# Patient Record
Sex: Male | Born: 1950 | Race: White | Marital: Married | Smoking: Current some day smoker
Health system: Southern US, Community
[De-identification: ages and names within clinical notes are randomized; demographics above are authoritative.]

## PROBLEM LIST (undated history)

## (undated) DIAGNOSIS — I739 Peripheral vascular disease, unspecified: Secondary | ICD-10-CM

## (undated) DIAGNOSIS — N059 Unspecified nephritic syndrome with unspecified morphologic changes: Secondary | ICD-10-CM

## (undated) DIAGNOSIS — F1020 Alcohol dependence, uncomplicated: Secondary | ICD-10-CM

## (undated) DIAGNOSIS — Z9889 Other specified postprocedural states: Secondary | ICD-10-CM

## (undated) DIAGNOSIS — T8859XA Other complications of anesthesia, initial encounter: Secondary | ICD-10-CM

## (undated) DIAGNOSIS — Z8489 Family history of other specified conditions: Secondary | ICD-10-CM

## (undated) DIAGNOSIS — R112 Nausea with vomiting, unspecified: Secondary | ICD-10-CM

## (undated) DIAGNOSIS — M1990 Unspecified osteoarthritis, unspecified site: Secondary | ICD-10-CM

## (undated) DIAGNOSIS — E785 Hyperlipidemia, unspecified: Secondary | ICD-10-CM

## (undated) DIAGNOSIS — F1910 Other psychoactive substance abuse, uncomplicated: Secondary | ICD-10-CM

## (undated) DIAGNOSIS — K635 Polyp of colon: Secondary | ICD-10-CM

## (undated) DIAGNOSIS — H269 Unspecified cataract: Secondary | ICD-10-CM

## (undated) HISTORY — PX: TONSILLECTOMY: SUR1361

## (undated) HISTORY — DX: Alcohol dependence, uncomplicated: F10.20

## (undated) HISTORY — PX: COLONOSCOPY: SHX174

## (undated) HISTORY — PX: EYE SURGERY: SHX253

## (undated) HISTORY — DX: Unspecified cataract: H26.9

## (undated) HISTORY — DX: Hyperlipidemia, unspecified: E78.5

## (undated) HISTORY — DX: Other psychoactive substance abuse, uncomplicated: F19.10

## (undated) HISTORY — DX: Polyp of colon: K63.5

---

## 1978-02-19 HISTORY — PX: KNEE ARTHROSCOPY: SUR90

## 2011-02-20 HISTORY — PX: CATARACT EXTRACTION W/ INTRAOCULAR LENS  IMPLANT, BILATERAL: SHX1307

## 2011-02-20 HISTORY — PX: EYE SURGERY: SHX253

## 2018-12-18 ENCOUNTER — Other Ambulatory Visit: Payer: Self-pay | Admitting: Neurosurgery

## 2018-12-18 DIAGNOSIS — M48062 Spinal stenosis, lumbar region with neurogenic claudication: Secondary | ICD-10-CM

## 2018-12-23 ENCOUNTER — Other Ambulatory Visit: Payer: Self-pay

## 2018-12-23 ENCOUNTER — Ambulatory Visit
Admission: RE | Admit: 2018-12-23 | Discharge: 2018-12-23 | Disposition: A | Discharge status: Home / Self Care | Payer: Medicare Other | Source: Ambulatory Visit | Attending: Neurosurgery | Admitting: Neurosurgery

## 2018-12-23 DIAGNOSIS — M48062 Spinal stenosis, lumbar region with neurogenic claudication: Secondary | ICD-10-CM

## 2018-12-23 MED ORDER — METHYLPREDNISOLONE ACETATE 40 MG/ML INJ SUSP (RADIOLOG
120.0000 mg | Freq: Once | INTRAMUSCULAR | Status: AC
  Administered 2018-12-23: 120 mg via EPIDURAL

## 2018-12-23 MED ORDER — IOPAMIDOL (ISOVUE-M 200) INJECTION 41%
1.0000 mL | Freq: Once | INTRAMUSCULAR | Status: AC
  Administered 2018-12-23: 11:00:00 1 mL via EPIDURAL

## 2018-12-23 NOTE — Discharge Instructions (Signed)

## 2019-01-05 ENCOUNTER — Other Ambulatory Visit: Payer: Self-pay | Admitting: Neurosurgery

## 2019-01-05 DIAGNOSIS — M48062 Spinal stenosis, lumbar region with neurogenic claudication: Secondary | ICD-10-CM

## 2019-01-07 ENCOUNTER — Ambulatory Visit
Admission: RE | Admit: 2019-01-07 | Discharge: 2019-01-07 | Disposition: A | Discharge status: Home / Self Care | Payer: Medicare Other | Source: Ambulatory Visit | Attending: Neurosurgery | Admitting: Neurosurgery

## 2019-01-07 ENCOUNTER — Other Ambulatory Visit: Payer: Self-pay

## 2019-01-07 DIAGNOSIS — M48062 Spinal stenosis, lumbar region with neurogenic claudication: Secondary | ICD-10-CM

## 2019-01-07 MED ORDER — METHYLPREDNISOLONE ACETATE 40 MG/ML INJ SUSP (RADIOLOG
120.0000 mg | Freq: Once | INTRAMUSCULAR | Status: AC
  Administered 2019-01-07: 120 mg via EPIDURAL

## 2019-01-07 MED ORDER — IOPAMIDOL (ISOVUE-M 200) INJECTION 41%
1.0000 mL | Freq: Once | INTRAMUSCULAR | Status: AC
  Administered 2019-01-07: 1 mL via EPIDURAL

## 2019-01-07 NOTE — Discharge Instructions (Signed)

## 2019-01-20 ENCOUNTER — Other Ambulatory Visit: Payer: Self-pay | Admitting: Neurosurgery

## 2019-01-21 ENCOUNTER — Other Ambulatory Visit: Payer: Self-pay | Admitting: Neurosurgery

## 2019-02-02 NOTE — Pre-Procedure Instructions (Signed)
Gerald Carpenter  02/02/2019      [address] [place] [place] [address] Phone: [phone] Fax: [phone]    Your procedure is scheduled on February 05, 2019.  Report to [facility] En[facility] "A" at 1040 AM.  Call this number if you have problems the morning of surgery:  [phone]  Call [phone] if you have any questions prior to your surgery date Monday-Friday 8am-4pm    Remember:  Do not eat or drink after midnight.  Take these medicines the morning of surgery with A SIP OF WATER -NONE   Beginning now, STOP taking any Aspirin (unless otherwise instructed by your surgeon), Aleve, Naproxen, Ibuprofen, Motrin, Advil, Goody's, BC's, all herbal medications, fish oil, and all vitamins  DAY OF SURGERY:  Do not wear jewelry  Do not wear lotions, powders, or colognes, or deodorant.  Men may shave face and neck.  Do not bring valuables to the hospital.  [facility] is not responsible for any belongings or valuables.  IF you are a smoker, DO NOT Smoke 24 hours prior to surgery   IF you wear a CPAP at night please bring your mask, tubing, and machine the morning of surgery    Remember that you must have someone to transport you home after your surgery, and remain with you for 24 hours if you are discharged the same day.  Contacts, dentures or bridgework may not be worn into surgery.  Leave your suitcase in the car.  After surgery it may be brought to your room.  For patients admitted to the hospital, discharge time will be determined by your treatment team.  Patients discharged the day of surgery will not be allowed to drive home.    [facility]- Preparing For Surgery  Before surgery, you can play an important role. Because skin is not sterile, your skin needs to be as free of germs as possible. You can reduce the number of germs on your skin by washing with CHG (chlorahexidine gluconate) Soap before  surgery.  CHG is an antiseptic cleaner which kills germs and bonds with the skin to continue killing germs even after washing.    Oral Hygiene is also important to reduce your risk of infection.  Remember - BRUSH YOUR TEETH THE MORNING OF SURGERY WITH YOUR REGULAR TOOTHPASTE  Please do not use if you have an allergy to CHG or antibacterial soaps. If your skin becomes reddened/irritated stop using the CHG.  Do not shave (including legs and underarms) for at least 48 hours prior to first CHG shower. It is OK to shave your face.  Please follow these instructions carefully.   1. Shower the NIGHT BEFORE SURGERY and the MORNING OF SURGERY with CHG.   2. If you chose to wash your hair, wash your hair first as usual with your normal shampoo.  3. After you shampoo, rinse your hair and body thoroughly to remove the shampoo.  4. Use CHG as you would any other liquid soap. You can apply CHG directly to the skin and wash gently with a scrungie or a clean washcloth.   5. Apply the CHG Soap to your body ONLY FROM THE NECK DOWN.  Do not use on open wounds or open sores. Avoid contact with your eyes, ears, mouth and genitals (private parts). Wash Face and genitals (private parts)  with your normal soap.  6. Wash thoroughly, paying special attention to the area where your  surgery will be performed.  7. Thoroughly rinse your body with warm water from the neck down.  8. DO NOT shower/wash with your normal soap after using and rinsing off the CHG Soap.  9. Pat yourself dry with a CLEAN TOWEL.  10. Wear CLEAN PAJAMAS to bed the night before surgery, wear comfortable clothes the morning of surgery  11. Place CLEAN SHEETS on your bed the night of your first shower and DO NOT SLEEP WITH PETS.  Day of Surgery: Shower with CHG soap as above Do not apply any deodorants/lotions.  Please wear clean clothes to the hospital/surgery center.   Remember to brush your teeth WITH YOUR REGULAR TOOTHPASTE.  Please  read over the following fact sheets that you were given.

## 2019-02-03 ENCOUNTER — Encounter (HOSPITAL_COMMUNITY): Payer: Self-pay

## 2019-02-03 ENCOUNTER — Other Ambulatory Visit: Payer: Self-pay

## 2019-02-03 ENCOUNTER — Encounter (HOSPITAL_COMMUNITY)
Admission: RE | Admit: 2019-02-03 | Discharge: 2019-02-03 | Disposition: A | Discharge status: Home / Self Care | Payer: Medicare Other | Source: Ambulatory Visit | Attending: Neurosurgery | Admitting: Neurosurgery

## 2019-02-03 ENCOUNTER — Other Ambulatory Visit (HOSPITAL_COMMUNITY)
Admission: RE | Admit: 2019-02-03 | Discharge: 2019-02-03 | Disposition: A | Discharge status: Home / Self Care | Payer: Medicare Other | Source: Ambulatory Visit | Attending: Neurosurgery | Admitting: Neurosurgery

## 2019-02-03 DIAGNOSIS — Z20828 Contact with and (suspected) exposure to other viral communicable diseases: Secondary | ICD-10-CM | POA: Insufficient documentation

## 2019-02-03 DIAGNOSIS — J449 Chronic obstructive pulmonary disease, unspecified: Secondary | ICD-10-CM | POA: Diagnosis not present

## 2019-02-03 DIAGNOSIS — Z01812 Encounter for preprocedural laboratory examination: Secondary | ICD-10-CM | POA: Insufficient documentation

## 2019-02-03 DIAGNOSIS — M5116 Intervertebral disc disorders with radiculopathy, lumbar region: Secondary | ICD-10-CM | POA: Diagnosis not present

## 2019-02-03 DIAGNOSIS — Z5309 Procedure and treatment not carried out because of other contraindication: Secondary | ICD-10-CM | POA: Diagnosis not present

## 2019-02-03 DIAGNOSIS — M48061 Spinal stenosis, lumbar region without neurogenic claudication: Secondary | ICD-10-CM | POA: Diagnosis present

## 2019-02-03 DIAGNOSIS — F17210 Nicotine dependence, cigarettes, uncomplicated: Secondary | ICD-10-CM | POA: Diagnosis not present

## 2019-02-03 LAB — SURGICAL PCR SCREEN
MRSA, PCR: NEGATIVE
Staphylococcus aureus: NEGATIVE

## 2019-02-03 LAB — ABO/RH: ABO/RH(D): A POS

## 2019-02-03 LAB — HEMOGLOBIN: Hemoglobin: 17 g/dL (ref 13.0–17.0)

## 2019-02-03 LAB — SARS CORONAVIRUS 2 (TAT 6-24 HRS): SARS Coronavirus 2: NEGATIVE

## 2019-02-03 NOTE — Progress Notes (Signed)
PCP - Denies; just moved here from [place] - Denies  Chest x-ray - N/A EKG - N/A Stress Test - denies  ECHO - denies Cardiac Cath - denies  Blood Thinner Instructions: N/A Aspirin Instructions: N/A  COVID TEST- scheduled after PAT today, 02/03/19  Coronavirus Screening  Have you experienced the following symptoms:  Cough yes/no: No Fever (>100.45F)  yes/no: No Runny nose yes/no: No Sore throat yes/no: No Difficulty breathing/shortness of breath  yes/no: No  Have you or a family member traveled in the last 14 days and where? yes/no: No  If the patient indicates "YES" to the above questions, their PAT will be rescheduled to limit the exposure to others and, the surgeon will be notified. THE PATIENT WILL NEED TO BE ASYMPTOMATIC FOR 14 DAYS.   If the patient is not experiencing any of these symptoms, the PAT nurse will instruct them to NOT bring anyone with them to their appointment since they may have these symptoms or traveled as well.   Please remind your patients and families that hospital visitation restrictions are in effect and the importance of the restrictions.   Anesthesia review: No  Patient denies shortness of breath, fever, cough and chest pain at PAT appointment  All instructions explained to the patient, with a verbal understanding of the material. Patient agrees to go over the instructions while at home for a better understanding. Patient also instructed to self quarantine after being tested for COVID-19. The opportunity to ask questions was provided.

## 2019-02-05 ENCOUNTER — Other Ambulatory Visit: Payer: Self-pay | Admitting: Neurosurgery

## 2019-02-05 ENCOUNTER — Encounter (HOSPITAL_COMMUNITY)
Admission: RE | Disposition: A | Discharge status: Home / Self Care | Payer: Self-pay | Source: Home / Self Care | Attending: Neurosurgery

## 2019-02-05 ENCOUNTER — Inpatient Hospital Stay (HOSPITAL_COMMUNITY): Payer: Medicare Other | Admitting: Certified Registered"

## 2019-02-05 ENCOUNTER — Inpatient Hospital Stay (HOSPITAL_COMMUNITY)
Admission: RE | Admit: 2019-02-05 | Discharge: 2019-02-06 | DRG: 552 | Disposition: A | Discharge status: Home / Self Care | Payer: Medicare Other | Attending: Neurosurgery | Admitting: Neurosurgery

## 2019-02-05 ENCOUNTER — Encounter (HOSPITAL_COMMUNITY): Payer: Self-pay | Admitting: Neurosurgery

## 2019-02-05 ENCOUNTER — Other Ambulatory Visit: Payer: Self-pay

## 2019-02-05 ENCOUNTER — Inpatient Hospital Stay (HOSPITAL_COMMUNITY): Payer: Medicare Other

## 2019-02-05 DIAGNOSIS — Z5309 Procedure and treatment not carried out because of other contraindication: Secondary | ICD-10-CM | POA: Diagnosis not present

## 2019-02-05 DIAGNOSIS — J449 Chronic obstructive pulmonary disease, unspecified: Secondary | ICD-10-CM | POA: Diagnosis not present

## 2019-02-05 DIAGNOSIS — M5416 Radiculopathy, lumbar region: Secondary | ICD-10-CM | POA: Diagnosis present

## 2019-02-05 DIAGNOSIS — M48061 Spinal stenosis, lumbar region without neurogenic claudication: Secondary | ICD-10-CM | POA: Diagnosis present

## 2019-02-05 DIAGNOSIS — Z419 Encounter for procedure for purposes other than remedying health state, unspecified: Secondary | ICD-10-CM

## 2019-02-05 DIAGNOSIS — M5116 Intervertebral disc disorders with radiculopathy, lumbar region: Secondary | ICD-10-CM | POA: Diagnosis not present

## 2019-02-05 DIAGNOSIS — Z20828 Contact with and (suspected) exposure to other viral communicable diseases: Secondary | ICD-10-CM | POA: Diagnosis present

## 2019-02-05 DIAGNOSIS — F17210 Nicotine dependence, cigarettes, uncomplicated: Secondary | ICD-10-CM | POA: Diagnosis present

## 2019-02-05 HISTORY — PX: ANTERIOR LAT LUMBAR FUSION: SHX1168

## 2019-02-05 LAB — CBC
HCT: 47.8 % (ref 39.0–52.0)
Hemoglobin: 15.9 g/dL (ref 13.0–17.0)
MCH: 31.2 pg (ref 26.0–34.0)
MCHC: 33.3 g/dL (ref 30.0–36.0)
MCV: 93.9 fL (ref 80.0–100.0)
Platelets: 213 10*3/uL (ref 150–400)
RBC: 5.09 MIL/uL (ref 4.22–5.81)
RDW: 14.2 % (ref 11.5–15.5)
WBC: 13.8 10*3/uL — ABNORMAL HIGH (ref 4.0–10.5)
nRBC: 0 % (ref 0.0–0.2)

## 2019-02-05 SURGERY — ANTERIOR LATERAL LUMBAR FUSION 1 LEVEL
Anesthesia: General | Site: Spine Lumbar | Laterality: Right

## 2019-02-05 MED ORDER — PROPOFOL 500 MG/50ML IV EMUL
INTRAVENOUS | Status: DC | PRN
  Administered 2019-02-05: 50 ug/kg/min via INTRAVENOUS

## 2019-02-05 MED ORDER — HYDROMORPHONE HCL 1 MG/ML IJ SOLN: 0.5000 mg | INTRAMUSCULAR | Status: DC | PRN

## 2019-02-05 MED ORDER — FENTANYL CITRATE (PF) 100 MCG/2ML IJ SOLN
INTRAMUSCULAR | Status: AC
  Filled 2019-02-05: qty 2

## 2019-02-05 MED ORDER — SODIUM CHLORIDE 0.9 % IV SOLN: INTRAVENOUS | Status: DC

## 2019-02-05 MED ORDER — BUPIVACAINE HCL (PF) 0.5 % IJ SOLN
INTRAMUSCULAR | Status: DC | PRN
  Administered 2019-02-05: 5 mL

## 2019-02-05 MED ORDER — PHENOL 1.4 % MT LIQD: 1.0000 | OROMUCOSAL | Status: DC | PRN

## 2019-02-05 MED ORDER — ACETAMINOPHEN 650 MG RE SUPP: 650.0000 mg | RECTAL | Status: DC | PRN

## 2019-02-05 MED ORDER — ACETAMINOPHEN 325 MG PO TABS: 650.0000 mg | ORAL_TABLET | ORAL | Status: DC | PRN

## 2019-02-05 MED ORDER — CEFAZOLIN SODIUM-DEXTROSE 2-4 GM/100ML-% IV SOLN
2.0000 g | Freq: Three times a day (TID) | INTRAVENOUS | Status: AC
  Administered 2019-02-05 – 2019-02-06 (×2): 2 g via INTRAVENOUS
  Filled 2019-02-05 (×2): qty 100

## 2019-02-05 MED ORDER — ONDANSETRON HCL 4 MG/2ML IJ SOLN: 4.0000 mg | Freq: Four times a day (QID) | INTRAMUSCULAR | Status: DC | PRN

## 2019-02-05 MED ORDER — ACETAMINOPHEN 500 MG PO TABS
1000.0000 mg | ORAL_TABLET | Freq: Four times a day (QID) | ORAL | Status: AC
  Administered 2019-02-05 (×2): 1000 mg via ORAL
  Administered 2019-02-06: 05:00:00 500 mg via ORAL
  Filled 2019-02-05 (×3): qty 2

## 2019-02-05 MED ORDER — DEXAMETHASONE SODIUM PHOSPHATE 4 MG/ML IJ SOLN
INTRAMUSCULAR | Status: DC | PRN
  Administered 2019-02-05: 5 mg via INTRAVENOUS

## 2019-02-05 MED ORDER — PROPOFOL 1000 MG/100ML IV EMUL
INTRAVENOUS | Status: AC
  Filled 2019-02-05: qty 100

## 2019-02-05 MED ORDER — FLEET ENEMA 7-19 GM/118ML RE ENEM: 1.0000 | ENEMA | Freq: Once | RECTAL | Status: DC | PRN

## 2019-02-05 MED ORDER — ACETAMINOPHEN 500 MG PO TABS: 1000.0000 mg | ORAL_TABLET | Freq: Once | ORAL | Status: DC

## 2019-02-05 MED ORDER — THROMBIN 5000 UNITS EX SOLR
CUTANEOUS | Status: AC
  Filled 2019-02-05: qty 5000

## 2019-02-05 MED ORDER — MIDAZOLAM HCL 5 MG/5ML IJ SOLN
INTRAMUSCULAR | Status: DC | PRN
  Administered 2019-02-05: 2 mg via INTRAVENOUS

## 2019-02-05 MED ORDER — MENTHOL 3 MG MT LOZG: 1.0000 | LOZENGE | OROMUCOSAL | Status: DC | PRN

## 2019-02-05 MED ORDER — 0.9 % SODIUM CHLORIDE (POUR BTL) OPTIME
TOPICAL | Status: DC | PRN
  Administered 2019-02-05: 1000 mL

## 2019-02-05 MED ORDER — LIDOCAINE-EPINEPHRINE 1 %-1:100000 IJ SOLN
INTRAMUSCULAR | Status: DC | PRN
  Administered 2019-02-05: 5 mL

## 2019-02-05 MED ORDER — DOCUSATE SODIUM 100 MG PO CAPS
100.0000 mg | ORAL_CAPSULE | Freq: Two times a day (BID) | ORAL | Status: DC
  Administered 2019-02-05: 100 mg via ORAL
  Filled 2019-02-05: qty 1

## 2019-02-05 MED ORDER — FENTANYL CITRATE (PF) 250 MCG/5ML IJ SOLN
INTRAMUSCULAR | Status: AC
  Filled 2019-02-05: qty 5

## 2019-02-05 MED ORDER — OXYCODONE HCL 5 MG PO TABS
5.0000 mg | ORAL_TABLET | ORAL | Status: DC | PRN
  Administered 2019-02-05: 10 mg via ORAL
  Filled 2019-02-05: qty 2

## 2019-02-05 MED ORDER — LIDOCAINE-EPINEPHRINE 1 %-1:100000 IJ SOLN
INTRAMUSCULAR | Status: AC
  Filled 2019-02-05: qty 1

## 2019-02-05 MED ORDER — ZOLPIDEM TARTRATE 5 MG PO TABS: 5.0000 mg | ORAL_TABLET | Freq: Every evening | ORAL | Status: DC | PRN

## 2019-02-05 MED ORDER — PROTAMINE SULFATE 10 MG/ML IV SOLN
INTRAVENOUS | Status: AC
  Filled 2019-02-05: qty 5

## 2019-02-05 MED ORDER — PROMETHAZINE HCL 25 MG/ML IJ SOLN: 6.2500 mg | INTRAMUSCULAR | Status: DC | PRN

## 2019-02-05 MED ORDER — PHENYLEPHRINE HCL-NACL 10-0.9 MG/250ML-% IV SOLN
INTRAVENOUS | Status: DC | PRN
  Administered 2019-02-05: 15 ug/min via INTRAVENOUS

## 2019-02-05 MED ORDER — ONDANSETRON HCL 4 MG/2ML IJ SOLN
INTRAMUSCULAR | Status: DC | PRN
  Administered 2019-02-05: 4 mg via INTRAVENOUS

## 2019-02-05 MED ORDER — PROPOFOL 10 MG/ML IV BOLUS
INTRAVENOUS | Status: DC | PRN
  Administered 2019-02-05: 100 mg via INTRAVENOUS

## 2019-02-05 MED ORDER — THROMBIN 5000 UNITS EX SOLR: OROMUCOSAL | Status: DC | PRN

## 2019-02-05 MED ORDER — FENTANYL CITRATE (PF) 100 MCG/2ML IJ SOLN
INTRAMUSCULAR | Status: DC | PRN
  Administered 2019-02-05: 50 ug via INTRAVENOUS
  Administered 2019-02-05: 100 ug via INTRAVENOUS
  Administered 2019-02-05: 50 ug via INTRAVENOUS

## 2019-02-05 MED ORDER — FENTANYL CITRATE (PF) 100 MCG/2ML IJ SOLN
25.0000 ug | INTRAMUSCULAR | Status: DC | PRN
  Administered 2019-02-05: 16:00:00 50 ug via INTRAVENOUS

## 2019-02-05 MED ORDER — METHOCARBAMOL 500 MG PO TABS
500.0000 mg | ORAL_TABLET | Freq: Four times a day (QID) | ORAL | Status: DC | PRN
  Administered 2019-02-05: 20:00:00 500 mg via ORAL
  Filled 2019-02-05: qty 1

## 2019-02-05 MED ORDER — METHOCARBAMOL 1000 MG/10ML IJ SOLN
500.0000 mg | Freq: Four times a day (QID) | INTRAVENOUS | Status: DC | PRN
  Filled 2019-02-05: qty 5

## 2019-02-05 MED ORDER — CEFAZOLIN SODIUM-DEXTROSE 2-4 GM/100ML-% IV SOLN
2.0000 g | INTRAVENOUS | Status: AC
  Administered 2019-02-05: 14:00:00 2 g via INTRAVENOUS

## 2019-02-05 MED ORDER — SODIUM CHLORIDE 0.9% FLUSH
3.0000 mL | Freq: Two times a day (BID) | INTRAVENOUS | Status: DC
  Administered 2019-02-05: 3 mL via INTRAVENOUS

## 2019-02-05 MED ORDER — SODIUM CHLORIDE 0.9% FLUSH: 3.0000 mL | INTRAVENOUS | Status: DC | PRN

## 2019-02-05 MED ORDER — PROPOFOL 10 MG/ML IV BOLUS
INTRAVENOUS | Status: AC
  Filled 2019-02-05: qty 20

## 2019-02-05 MED ORDER — CHLORHEXIDINE GLUCONATE CLOTH 2 % EX PADS: 6.0000 | MEDICATED_PAD | Freq: Once | CUTANEOUS | Status: DC

## 2019-02-05 MED ORDER — MIDAZOLAM HCL 2 MG/2ML IJ SOLN
INTRAMUSCULAR | Status: AC
  Filled 2019-02-05: qty 2

## 2019-02-05 MED ORDER — HYDROCODONE-ACETAMINOPHEN 5-325 MG PO TABS
1.0000 | ORAL_TABLET | ORAL | Status: DC | PRN
  Administered 2019-02-06 (×2): 1 via ORAL
  Filled 2019-02-05 (×3): qty 1

## 2019-02-05 MED ORDER — BISACODYL 10 MG RE SUPP: 10.0000 mg | Freq: Every day | RECTAL | Status: DC | PRN

## 2019-02-05 MED ORDER — SODIUM CHLORIDE 0.9 % IV SOLN: INTRAVENOUS | Status: DC | PRN

## 2019-02-05 MED ORDER — PROTAMINE SULFATE 10 MG/ML IV SOLN
INTRAVENOUS | Status: AC
  Filled 2019-02-05: qty 25

## 2019-02-05 MED ORDER — LIDOCAINE HCL (CARDIAC) PF 100 MG/5ML IV SOSY
PREFILLED_SYRINGE | INTRAVENOUS | Status: DC | PRN
  Administered 2019-02-05: 50 mg via INTRAVENOUS

## 2019-02-05 MED ORDER — LACTATED RINGERS IV SOLN: INTRAVENOUS | Status: DC | PRN

## 2019-02-05 MED ORDER — BUPIVACAINE HCL (PF) 0.5 % IJ SOLN
INTRAMUSCULAR | Status: AC
  Filled 2019-02-05: qty 30

## 2019-02-05 MED ORDER — PROPOFOL 500 MG/50ML IV EMUL: INTRAVENOUS | Status: DC | PRN

## 2019-02-05 MED ORDER — SENNA 8.6 MG PO TABS
1.0000 | ORAL_TABLET | Freq: Two times a day (BID) | ORAL | Status: DC
  Administered 2019-02-05 – 2019-02-06 (×2): 8.6 mg via ORAL
  Filled 2019-02-05 (×2): qty 1

## 2019-02-05 MED ORDER — ONDANSETRON HCL 4 MG PO TABS: 4.0000 mg | ORAL_TABLET | Freq: Four times a day (QID) | ORAL | Status: DC | PRN

## 2019-02-05 MED ORDER — SUCCINYLCHOLINE CHLORIDE 20 MG/ML IJ SOLN
INTRAMUSCULAR | Status: DC | PRN
  Administered 2019-02-05: 100 mg via INTRAVENOUS

## 2019-02-05 MED ORDER — PHENYLEPHRINE HCL-NACL 10-0.9 MG/250ML-% IV SOLN: INTRAVENOUS | Status: DC | PRN

## 2019-02-05 MED ORDER — SENNOSIDES-DOCUSATE SODIUM 8.6-50 MG PO TABS: 1.0000 | ORAL_TABLET | Freq: Every evening | ORAL | Status: DC | PRN

## 2019-02-05 SURGICAL SUPPLY — 85 items
BATTALION LATERAL INTRA SHIM ×1 IMPLANT
BATTALION LLIF ITRADISCAL SHIM (MISCELLANEOUS)
BIT DRILL LONG 3.0X30 (BIT) IMPLANT
BIT DRILL LONG 3X80 (BIT) IMPLANT
BIT DRILL LONG 4X80 (BIT) IMPLANT
BIT DRILL SHORT 3.0X30 (BIT) IMPLANT
BIT DRILL SHORT 3X80 (BIT) IMPLANT
BUR MATCHSTICK NEURO 3.0 LAGG (BURR) ×3 IMPLANT
BUR PRECISION FLUTE 5.0 (BURR) ×3 IMPLANT
CANISTER SUCT 3000ML PPV (MISCELLANEOUS) ×4 IMPLANT
CARTRIDGE OIL MAESTRO DRILL (MISCELLANEOUS) ×3 IMPLANT
CLIP SPRING STIM LLIF SAFEOP (CLIP) IMPLANT
CONT SPEC 4OZ CLIKSEAL STRL BL (MISCELLANEOUS) ×4 IMPLANT
COVER BACK TABLE 60X90IN (DRAPES) ×4 IMPLANT
DERMABOND ADVANCED (GAUZE/BANDAGES/DRESSINGS) ×1
DERMABOND ADVANCED .7 DNX12 (GAUZE/BANDAGES/DRESSINGS) ×6 IMPLANT
DILATOR INSULATED LLIF 8-13-18 ×1 IMPLANT
DILATOR INSULATED LLIF 8-13-18 (NEUROSURGERY SUPPLIES) IMPLANT
DISSECTOR BLUNT TIP ENDO 5MM (MISCELLANEOUS) ×3 IMPLANT
DRAPE C-ARM 42X72 X-RAY (DRAPES) ×8 IMPLANT
DRAPE C-ARMOR (DRAPES) ×8 IMPLANT
DRAPE LAPAROTOMY 100X72X124 (DRAPES) ×8 IMPLANT
DRAPE SHEET LG 3/4 BI-LAMINATE (DRAPES) ×5 IMPLANT
DRSG OPSITE POSTOP 3X4 (GAUZE/BANDAGES/DRESSINGS) ×1 IMPLANT
DRSG OPSITE POSTOP 4X6 (GAUZE/BANDAGES/DRESSINGS) ×1 IMPLANT
DURAPREP 26ML APPLICATOR (WOUND CARE) ×8 IMPLANT
ELECT KIT SAFEOP EMG/NMJ (KITS)
ELECT REM PT RETURN 9FT ADLT (ELECTROSURGICAL) ×8
ELECTRODE REM PT RTRN 9FT ADLT (ELECTROSURGICAL) ×6 IMPLANT
GAUZE 4X4 16PLY RFD (DISPOSABLE) IMPLANT
GAUZE SPONGE 4X4 12PLY STRL (GAUZE/BANDAGES/DRESSINGS) IMPLANT
GLOVE BIO SURGEON STRL SZ7 (GLOVE) IMPLANT
GLOVE BIOGEL PI IND STRL 7.0 (GLOVE) IMPLANT
GLOVE BIOGEL PI IND STRL 7.5 (GLOVE) ×6 IMPLANT
GLOVE BIOGEL PI INDICATOR 7.0 (GLOVE)
GLOVE BIOGEL PI INDICATOR 7.5 (GLOVE) ×2
GLOVE ECLIPSE 7.0 STRL STRAW (GLOVE) ×8 IMPLANT
GOWN STRL REUS W/ TWL LRG LVL3 (GOWN DISPOSABLE) ×6 IMPLANT
GOWN STRL REUS W/ TWL XL LVL3 (GOWN DISPOSABLE) ×6 IMPLANT
GOWN STRL REUS W/TWL LRG LVL3 (GOWN DISPOSABLE) ×4
GOWN STRL REUS W/TWL XL LVL3 (GOWN DISPOSABLE) ×2
GUIDEWIRE TROCAR TIP 320MM ×1 IMPLANT
HEMOSTAT POWDER KIT SURGIFOAM (HEMOSTASIS) ×4 IMPLANT
K-WIRE TROCAR LLIF SAFEOP 320 (WIRE)
KIT BASIN OR (CUSTOM PROCEDURE TRAY) ×8 IMPLANT
KIT EMG SRFC ELECT (KITS) IMPLANT
KIT INFUSE XX SMALL 0.7CC (Orthopedic Implant) IMPLANT
KIT SPINE MAZOR X ROBO DISP (MISCELLANEOUS) ×4 IMPLANT
KIT TURNOVER KIT B (KITS) ×8 IMPLANT
KNIFE 170 LACEY STERILE ×1 IMPLANT
KNIFE ANNULOTOMY (BLADE) IMPLANT
KWIRE TROCAR LLIF SAFEOP 320 (WIRE) IMPLANT
LIF ILLUMINATION SYSTEM ×1 IMPLANT
LIF ILLUMINATION SYSTEM STERIL (SYSTAGENIX WOUND MANAGEMENT)
NDL HYPO 18GX1.5 BLUNT FILL (NEEDLE) IMPLANT
NDL HYPO 25X1 1.5 SAFETY (NEEDLE) ×3 IMPLANT
NDL SPNL 18GX3.5 QUINCKE PK (NEEDLE) ×3 IMPLANT
NEEDLE HYPO 18GX1.5 BLUNT FILL (NEEDLE) IMPLANT
NEEDLE HYPO 22GX1.5 SAFETY (NEEDLE) ×4 IMPLANT
NEEDLE HYPO 25X1 1.5 SAFETY (NEEDLE) ×4 IMPLANT
NEEDLE SPNL 18GX3.5 QUINCKE PK (NEEDLE) ×4 IMPLANT
NS IRRIG 1000ML POUR BTL (IV SOLUTION) ×8 IMPLANT
OIL CARTRIDGE MAESTRO DRILL (MISCELLANEOUS) ×4
PACK LAMINECTOMY NEURO (CUSTOM PROCEDURE TRAY) ×8 IMPLANT
PAD ARMBOARD 7.5X6 YLW CONV (MISCELLANEOUS) ×12 IMPLANT
PIN HEAD 2.5X60MM (PIN) IMPLANT
PROBE BALL TIP LLIF SAFEOP (NEUROSURGERY SUPPLIES) IMPLANT
PUTTY DBM 5CC IMPLANT
SAFE OP STIMU BALL TIP PROBE ×1 IMPLANT
SAFE OP STIMULATING CLIP ×1 IMPLANT
SAFEOP EMG/NMJ PROCEDURE KIT ×1 IMPLANT
SCREW SCHANZ SA 4.0MM (MISCELLANEOUS) IMPLANT
SHIM ITRADISCAL BATTALION LLIF (MISCELLANEOUS) IMPLANT
STAPLER VISISTAT 35W (STAPLE) ×4 IMPLANT
SUT VIC AB 0 CT1 18XCR BRD8 (SUTURE) ×6 IMPLANT
SUT VIC AB 0 CT1 8-18 (SUTURE) ×1
SUT VIC AB 2-0 CT1 18 (SUTURE) ×3 IMPLANT
SUT VIC AB 3-0 SH 8-18 (SUTURE) ×6 IMPLANT
SUT VICRYL 3-0 RB1 18 ABS (SUTURE) ×4 IMPLANT
SYSTEM ILLUMINATION LIF STERIL (SYSTAGENIX WOUND MANAGEMENT) IMPLANT
TOWEL GREEN STERILE (TOWEL DISPOSABLE) ×7 IMPLANT
TOWEL GREEN STERILE FF (TOWEL DISPOSABLE) ×7 IMPLANT
TRAY FOLEY MTR SLVR 16FR STAT (SET/KITS/TRAYS/PACK) ×7 IMPLANT
TUBE MAZOR SA REDUCTION (TUBING) ×4 IMPLANT
WATER STERILE IRR 1000ML POUR (IV SOLUTION) ×7 IMPLANT

## 2019-02-05 NOTE — Anesthesia Preprocedure Evaluation (Addendum)
Anesthesia Evaluation  Patient identified by MRN, date of birth, ID band Patient awake    Reviewed: Allergy & Precautions, NPO status , Patient's Chart, lab work & pertinent test results  Airway Mallampati: II  TM Distance: >3 FB Neck ROM: Full    Dental  (+) Dental Advisory Given   Pulmonary COPD, Current Smoker,    breath sounds clear to auscultation       Cardiovascular negative cardio ROS   Rhythm:Regular Rate:Normal     Neuro/Psych negative neurological ROS     GI/Hepatic negative GI ROS, Neg liver ROS,   Endo/Other  negative endocrine ROS  Renal/GU negative Renal ROS     Musculoskeletal   Abdominal   Peds  Hematology negative hematology ROS (+)   Anesthesia Other Findings   Reproductive/Obstetrics                            Lab Results  Component Value Date   WBC 13.8 (H) 02/05/2019   HGB 15.9 02/05/2019   HCT 47.8 02/05/2019   MCV 93.9 02/05/2019   PLT 213 02/05/2019   No results found for: CREATININE, BUN, NA, K, CL, CO2  Anesthesia Physical Anesthesia Plan  ASA: III  Anesthesia Plan: General   Post-op Pain Management:    Induction: Intravenous  PONV Risk Score and Plan: 1 and Dexamethasone, Ondansetron and Treatment may vary due to age or medical condition  Airway Management Planned: Oral ETT  Additional Equipment:   Intra-op Plan:   Post-operative Plan: Extubation in OR  Informed Consent: I have reviewed the patients History and Physical, chart, labs and discussed the procedure including the risks, benefits and alternatives for the proposed anesthesia with the patient or authorized representative who has indicated his/her understanding and acceptance.     Dental advisory given  Plan Discussed with: CRNA  Anesthesia Plan Comments:        Anesthesia Quick Evaluation

## 2019-02-05 NOTE — Progress Notes (Signed)
I have reviewed the intraoperative findings and our inability to complete the lateral surgery with the patient's wife. I told her we are planning of posterior decompression and fusion on Tuesday. We will plan on admitting Gerald Carpenter to [facility] as planned with likely d/c tomorrow.

## 2019-02-05 NOTE — Anesthesia Procedure Notes (Deleted)
Performed by: Lyndall Windt B, CRNA       

## 2019-02-05 NOTE — Progress Notes (Addendum)
  NEUROSURGERY PROGRESS NOTE   Patient will need CT L spine with mazor protocol for surgical planning. Will need to be done in pre-op. Order entered. Will need to be released by RN.  Please call for any concerns.  Ferne Reus, PA-C [facility] [employer]

## 2019-02-05 NOTE — Transfer of Care (Signed)
Immediate Anesthesia Transfer of Care Note  Patient: Gerald Carpenter  Procedure(s) Performed: ATTEMPTED ANTERIOR LATERAL INTERBODY FUSION (Right Spine Lumbar)  Patient Location: PACU  Anesthesia Type:General  Level of Consciousness: drowsy, patient cooperative and responds to stimulation  Airway & Oxygen Therapy: Patient Spontanous Breathing  Post-op Assessment: Report given to RN and Post -op Vital signs reviewed and stable  Post vital signs: Reviewed and stable  Last Vitals:  Vitals Value Taken Time  BP 108/57 02/05/19 1542  Temp    Pulse 73 02/05/19 1543  Resp 19 02/05/19 1543  SpO2 96 % 02/05/19 1543  Vitals shown include unvalidated device data.  Last Pain:  Vitals:   02/05/19 1035  PainSc: 8          Complications: No apparent anesthesia complications

## 2019-02-05 NOTE — H&P (Signed)
Chief Complaint   Back pain  HPI   HPI: Gerald Carpenter is a 68 y.o. male with fairly chronic history of back and right greater than left leg pain secondary to broad-based right eccentric disc bulge at L4-5.  He has failed reasonable conservative treatment including medical therapy, physical therapy and injection therapy.  He presents today for decompression and fusion. He is without any concerns.  There are no problems to display for this patient.   PMH: No past medical history on file.  PSH: Past Surgical History:  Procedure Laterality Date  . EYE SURGERY Bilateral 2013   lens implants for cataracts after Lasik  . KNEE ARTHROSCOPY Right 1980  . TONSILLECTOMY     at a very young age, but grew back    No medications prior to admission.    SH: Social History   Tobacco Use  . Smoking status: Current Some Day Smoker    Years: 60.00    Types: Cigarettes  . Smokeless tobacco: Never Used  . Tobacco comment: 2 PPD; quit approx 4 weeks ago; still "dabbles"  Substance Use Topics  . 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 Drug use: Not Currently    Comment: prior usage of "speed"  when drinking    MEDS: Prior to Admission medications   Not on File    ALLERGY: No Known Allergies  Social History   Tobacco Use  . Smoking status: Current Some Day Smoker    Years: 60.00    Types: Cigarettes  . Smokeless tobacco: Never Used  . Tobacco comment: 2 PPD; quit approx 4 weeks ago; still "dabbles"  Substance Use Topics  . 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   ROS   ROS  Exam   There were no vitals filed for this visit. General appearance: WDWN, NAD Eyes: No scleral injection Cardiovascular: Regular rate and rhythm without murmurs, rubs, gallops. No edema or variciosities. Distal pulses normal. Pulmonary: Effort normal, non-labored breathing Musculoskeletal:   Muscle tone upper extremities: Normal    Muscle tone lower extremities: Normal    Motor exam: Upper Extremities Deltoid Bicep Tricep Grip  Right 5/5 5/5 5/5 5/5  Left 5/5 5/5 5/5 5/5   Lower Extremity IP Quad PF DF EHL  Right 5/5 5/5 5/5 5/5 5/5  Left 5/5 5/5 5/5 5/5 5/5   Neurological Mental Status:    - Patient is awake, alert, oriented to person, place, month, year, and situation    - Patient is able to give a clear and coherent history.    - No signs of aphasia or neglect Cranial Nerves    - II: Visual Fields are full. PERRL    - III/IV/VI: EOMI without ptosis or diploplia.     - V: Facial sensation is grossly normal    - VII: Facial movement is symmetric.     - VIII: hearing is intact to voice    - X: Uvula elevates symmetrically    - XI: Shoulder shrug is symmetric.    - XII: tongue is midline without atrophy or fasciculations.  Sensory: Sensation grossly intact to LT  Results - Imaging/Labs   Results for orders placed or performed during the hospital encounter of 02/03/19 (from the past 48 hour(s))  SARS CORONAVIRUS 2 (TAT 6-24 HRS) Nasopharyngeal Nasopharyngeal Swab     Status: None  Collection Time: 02/03/19  9:20 AM   Specimen: Nasopharyngeal Swab  Result Value Ref Range   SARS Coronavirus 2 NEGATIVE NEGATIVE    Comment: (NOTE) SARS-CoV-2 target nucleic acids are NOT DETECTED. The SARS-CoV-2 RNA is generally detectable in upper and lower respiratory specimens during the acute phase of infection. Negative results do not preclude SARS-CoV-2 infection, do not rule out co-infections with other pathogens, and should not be used as the sole basis for treatment or other patient management decisions. Negative results must be combined with clinical observations, patient history, and epidemiological information. The expected result is Negative. Fact Sheet for Patients: SugarRoll.be Fact Sheet for Healthcare  Providers: https://www.woods-mathews.com/ This test is not yet approved or cleared by the [place] FDA and  has been authorized for detection and/or diagnosis of SARS-CoV-2 by FDA under an Emergency Use Authorization (EUA). This EUA will remain  in effect (meaning this test can be used) for the duration of the COVID-19 declaration under Section 56 4(b)(1) of the Act, 21 U.S.C. section 360bbb-3(b)(1), unless the authorization is terminated or revoked sooner. Performed at [facility], [place] [address], [place], [place] [address]     No results found.  IMAGING: MRI of the lumbar spine dated 10/03/2018 was again reviewed.  This demonstrates primary finding at L4-5 where there is disc desiccation with loss of height.  There is resultant primarily right-sided foraminal stenosis due to foraminal collapse and right eccentric disc bulge.   Impression/Plan   68 y.o. male with symptoms of lumbar radiculopathy related to disc height loss and foraminal collapse with broad-based right eccentric disc bulge worse on the right at L4-5.  Unfortunately, he has not responded to reasonable conservative treatment including medical therapy, physical therapy, and injection therapy.  We will proceed with right-sided lateral trans psoas diskectomy and placement of interbody cage to restore foraminal height followed by percutaneous pedicle screw stabilization at L4-5.  Risks, benefits and alternatives discussed. Patient stated understanding and wished to proceed.  Ferne Reus, PA-C [facility] [employer]

## 2019-02-05 NOTE — Op Note (Signed)
NEUROSURGERY OPERATIVE NOTE   PREOP DIAGNOSIS:  1. Lumbar stenosis, L4-5 2. Lumbago with radiculopathy   POSTOP DIAGNOSIS: Same  PROCEDURE: 1. Attempted right lateral trans-psoas approach for lateral interbody fusion (aborted) 2. Intraoperative electrophsiologic monitoring  SURGEON: Dr. Consuella Lose, MD  ASSISTANT: Ferne Reus, PA-C  ANESTHESIA: General Endotracheal  EBL: Minimal  SPECIMENS: None  DRAINS: None   COMPLICATIONS: None immediate  CONDITION: Hemodynamically stable to PACU  HISTORY: Gerald Carpenter is a 68 y.o. male who has been followed in the outpatient neurosurgery clinic with relatively chronic back and right leg pain.  He had previously attempted multiple different conservative treatments without lasting improvement.  His MRIs demonstrated right eccentric broad-based disc bulge with foraminal stenosis worse on the right at L4-5.  He therefore elected to proceed with surgical decompression and fusion.  Different treatment options were discussed including various surgical treatment options.  Given the lack of foraminal height he elected to proceed with my recommendation for lateral transpsoas discectomy and interbody fusion with percutaneous pedicle screw placement.  The risks and benefits of the procedure were reviewed in detail with the patient.  After all his questions were answered informed consent was obtained and witnessed.  PROCEDURE IN DETAIL: The patient was brought to the operating room. After induction of general anesthesia, the patient was positioned on the operative table in the left lateral decubitus position (right side up). All pressure points were meticulously padded.  AP and lateral fluoroscopic images were then taken to identify the surface projection of the L4-5 interspace.  The iliac crest was noted to be overlying the interspace.  The table was then broken in order to provide lateral flexion and bring the iliac crest down.  Skin incision was  then marked out and prepped and draped in the usual sterile fashion.  After timeout was conducted, the skin incision just superior to the iliac crest was made and carried down through the subcutaneous tissue.  The oblique musculature was identified and divided.  The transversalis fascia was then identified.  A small counterincision was then made more posteriorly and the transversalis fascia was entered.  Blunt finger dissection was then carried out in the retroperitoneal space.  The psoas muscle was identified.  I was then able to pass a small dilator through the transversalis fascia and docked on the surface of the psoas muscle.  At this point, intraoperative electrophysiologic monitoring was utilized in order to find a trajectory to the L4-5 interspace.  Multiple different trajectories through the psoas muscle were trialed, as well as multiple different docking positions along the anteroposterior path of the L4-5 disc space.  Unfortunately, I was not able to find a trajectory in which the lumbar plexus was stimulated with an amperage >55mA indicated very close proximity of the lumbar plexus. Based on the directional stimulation of every trajectory I attempted, it appeared that the docking points were in the axilla of the traversing nerve roots.  I therefore did not feel comfortable proceeding with placement of a dilator and expansion of the retractor which I felt would place undue traction on the nerve root and have the potential for permanent nerve injury.  After multiple different trajectories through the psoas and along the disc space were tried all with the same result, I elected to abort the procedure.  The wounds were irrigated with normal saline irrigation, the interrupted 0 Vicryl stitches, deep subcuticular layer was closed with interrupted 0 Vicryl stitches and the skin was closed with interrupted 3-0 Vicryl.  Dermabond  and sterile dressing was applied.  At the end of the case all sponge, needle,  instrument, and cottonoid counts were correct.  The patient was then transferred to the stretcher, extubated, and taken to the postanesthesia care unit in stable hemodynamic condition.

## 2019-02-05 NOTE — Anesthesia Procedure Notes (Signed)
Procedure Name: Intubation Date/Time: 02/05/2019 1:18 PM Performed by: Oletta Lamas, CRNA Pre-anesthesia Checklist: Patient identified, Emergency Drugs available, Suction available and Patient being monitored Patient Re-evaluated:Patient Re-evaluated prior to induction Oxygen Delivery Method: Circle System Utilized Preoxygenation: Pre-oxygenation with 100% oxygen Induction Type: IV induction Ventilation: Mask ventilation without difficulty Laryngoscope Size: Mac and 4 Grade View: Grade I Tube type: Oral Tube size: 7.5 mm Number of attempts: 1 Airway Equipment and Method: Stylet Placement Confirmation: ETT inserted through vocal cords under direct vision,  positive ETCO2 and breath sounds checked- equal and bilateral Secured at: 23 cm Tube secured with: Tape Dental Injury: Teeth and Oropharynx as per pre-operative assessment

## 2019-02-05 NOTE — Anesthesia Procedure Notes (Signed)
Arterial Line Insertion Performed by: Babs Bertin, CRNA, CRNA  Preanesthetic checklist: patient identified, IV checked, site marked, risks and benefits discussed, surgical consent, monitors and equipment checked, pre-op evaluation, timeout performed and anesthesia consent Right was placed Catheter size: 20 G Hand hygiene performed , maximum sterile barriers used  and Seldinger technique used Allen's test indicative of satisfactory collateral circulation Attempts: 1 Procedure performed without using ultrasound guided technique. Following insertion, dressing applied and Biopatch. Post procedure assessment: normal  Patient tolerated the procedure well with no immediate complications.

## 2019-02-06 DIAGNOSIS — M48061 Spinal stenosis, lumbar region without neurogenic claudication: Secondary | ICD-10-CM | POA: Diagnosis not present

## 2019-02-06 LAB — CBC
HCT: 46.2 % (ref 39.0–52.0)
Hemoglobin: 14.9 g/dL (ref 13.0–17.0)
MCH: 31.1 pg (ref 26.0–34.0)
MCHC: 32.3 g/dL (ref 30.0–36.0)
MCV: 96.5 fL (ref 80.0–100.0)
Platelets: 210 10*3/uL (ref 150–400)
RBC: 4.79 MIL/uL (ref 4.22–5.81)
RDW: 13.9 % (ref 11.5–15.5)
WBC: 16.8 10*3/uL — ABNORMAL HIGH (ref 4.0–10.5)
nRBC: 0 % (ref 0.0–0.2)

## 2019-02-06 LAB — APTT: aPTT: 31 seconds (ref 24–36)

## 2019-02-06 LAB — BASIC METABOLIC PANEL
Anion gap: 11 (ref 5–15)
BUN: 9 mg/dL (ref 8–23)
CO2: 23 mmol/L (ref 22–32)
Calcium: 8.6 mg/dL — ABNORMAL LOW (ref 8.9–10.3)
Chloride: 104 mmol/L (ref 98–111)
Creatinine, Ser: 1.12 mg/dL (ref 0.61–1.24)
GFR calc Af Amer: >60 mL/min (ref >60)
GFR calc non Af Amer: >60 mL/min (ref >60)
Glucose, Bld: 129 mg/dL — ABNORMAL HIGH (ref 70–99)
Potassium: 4.5 mmol/L (ref 3.5–5.1)
Sodium: 138 mmol/L (ref 135–145)

## 2019-02-06 LAB — PROTIME-INR
INR: 1 (ref 0.8–1.2)
Prothrombin Time: 13.4 seconds (ref 11.4–15.2)

## 2019-02-06 NOTE — Evaluation (Signed)
Physical Therapy Evaluation and Discharge Patient Details Name: Gerald Carpenter MRN: [id number] DOB: 1950-03-19 Today's Date: 02/06/2019   History of Present Illness  Pt is a 68 y/o male who presented for surgery 12/17, however procedure was aborted and rescheduled. Pt anticipates d/c home today to return next week for second attempt at surgery. No significant PMH noted.   Clinical Impression  Patient evaluated by Physical Therapy with no further acute PT needs identified. All education has been completed and the patient has no further questions. Focus of session was adjustement of brace for optimal fit, and introduction to back precautions/safety in preparation of upcoming surgery. Pt was able to demonstrate transfers and ambulation with gross supervision for safety to modified independence without an AD. Pt was educated on precautions, brace application/wearing schedule, appropriate activity progression, and car transfer. See below for any follow-up Physical Therapy or equipment needs. PT is signing off. Thank you for this referral.     Follow Up Recommendations No PT follow up;Supervision - Intermittent    Equipment Recommendations  None recommended by PT    Recommendations for Other Services       Precautions / Restrictions Precautions Precautions: Fall;Back Precaution Comments: Verbally reviewed back precautions for comfort until surgery next week.  Required Braces or Orthoses: Spinal Brace Spinal Brace: Lumbar corset;Applied in sitting position(Adjusted for optimal fit) Restrictions Weight Bearing Restrictions: No      Mobility  Bed Mobility Overal bed mobility: Needs Assistance Bed Mobility: Rolling;Sidelying to Sit Rolling: Modified independent (Device/Increase time) Sidelying to sit: Supervision       General bed mobility comments: VC's for imrpoved log roll technque. HOB flat and no rails required.   Transfers Overall transfer level: Needs assistance Equipment used:  None Transfers: Sit to/from Stand Sit to Stand: Supervision         General transfer comment: VC's for improved posture. Overall was able to power-up to full stand without assistance.   Ambulation/Gait Ambulation/Gait assistance: Modified independent (Device/Increase time) Gait Distance (Feet): 250 Feet Assistive device: None Gait Pattern/deviations: Step-through pattern;Decreased stride length;Trunk flexed Gait velocity: Decreased Gait velocity interpretation: <1.8 ft/sec, indicate of risk for recurrent falls General Gait Details: Antalgic due to pain. Pt required a rest break due to pain but overall tolerated ambulation well.   Stairs            Wheelchair Mobility    Modified Rankin (Stroke Patients Only)       Balance Overall balance assessment: Needs assistance Sitting-balance support: Feet supported;No upper extremity supported Sitting balance-Leahy Scale: Good     Standing balance support: During functional activity Standing balance-Leahy Scale: Fair                               Pertinent Vitals/Pain Pain Assessment: Faces Faces Pain Scale: Hurts even more Pain Location: Incision site Pain Descriptors / Indicators: Discomfort Pain Intervention(s): Limited activity within patient's tolerance;Monitored during session;Repositioned    Home Living Family/patient expects to be discharged to:: Private residence Living Arrangements: Spouse/significant other;Other relatives Available Help at Discharge: Family;Available 24 hours/day Type of Home: House Home Access: Stairs to enter   [employer] of Steps: 2 Home Layout: One level Home Equipment: None      Prior Function Level of Independence: Independent         Comments: Antalgic, flexed gait due to pain but independent     Hand Dominance        Extremity/Trunk Assessment  Upper Extremity Assessment Upper Extremity Assessment: Defer to OT evaluation    Lower  Extremity Assessment Lower Extremity Assessment: Generalized weakness(Consistent with pre-op diagnosis)    Cervical / Trunk Assessment Cervical / Trunk Assessment: Other exceptions Cervical / Trunk Exceptions: attempted surgery  Communication   Communication: No difficulties  Cognition Arousal/Alertness: Awake/alert Behavior During Therapy: WFL for tasks assessed/performed Overall Cognitive Status: Within Functional Limits for tasks assessed                                        General Comments      Exercises     Assessment/Plan    PT Assessment Patent does not need any further PT services  PT Problem List Decreased strength;Decreased activity tolerance;Decreased balance;Decreased mobility;Decreased safety awareness;Decreased knowledge of precautions;Pain       PT Treatment Interventions      PT Goals (Current goals can be found in the Care Plan section)  Acute Rehab PT Goals Patient Stated Goal: Home today; success next surgical attempt PT Goal Formulation: All assessment and education complete, DC therapy    Frequency     Barriers to discharge        Co-evaluation               AM-PAC PT "6 Clicks" Mobility  Outcome Measure Help needed turning from your back to your side while in a flat bed without using bedrails?: None Help needed moving from lying on your back to sitting on the side of a flat bed without using bedrails?: None Help needed moving to and from a bed to a chair (including a wheelchair)?: None Help needed standing up from a chair using your arms (e.g., wheelchair or bedside chair)?: A Little Help needed to walk in hospital room?: A Little Help needed climbing 3-5 steps with a railing? : A Little 6 Click Score: 21    End of Session Equipment Utilized During Treatment: Back brace Activity Tolerance: Patient tolerated treatment well Patient left: in chair;with call bell/phone within reach Nurse Communication: Mobility  status PT Visit Diagnosis: Pain;Other symptoms and signs involving the nervous system (R29.898) Pain - part of body: (back)    Time: [id number] PT Time Calculation (min) (ACUTE ONLY): 24 min   Charges:   PT Evaluation $PT Eval Low Complexity: 1 Low PT Treatments $Gait Training: 8-22 mins        Rolinda Roan, PT, DPT Acute Rehabilitation Services Pager: [phone] Office: [place] 02/06/2019, 1:16 PM

## 2019-02-06 NOTE — Anesthesia Postprocedure Evaluation (Signed)
Anesthesia Post Note  Patient: Gerald Carpenter  Procedure(s) Performed: ATTEMPTED ANTERIOR LATERAL INTERBODY FUSION (Right Spine Lumbar)     Patient location during evaluation: PACU Anesthesia Type: General Level of consciousness: awake and alert Pain management: pain level controlled Vital Signs Assessment: post-procedure vital signs reviewed and stable Respiratory status: spontaneous breathing, nonlabored ventilation, respiratory function stable and patient connected to nasal cannula oxygen Cardiovascular status: blood pressure returned to baseline and stable Postop Assessment: no apparent nausea or vomiting Anesthetic complications: no    Last Vitals:  Vitals:   02/06/19 0350 02/06/19 0729  BP: (!) 98/54 111/60  Pulse: (!) 57 (!) 59  Resp: 18 18  Temp: 36.6 C 36.4 C  SpO2: 94% 96%    Last Pain:  Vitals:   02/06/19 0729  TempSrc: Oral  PainSc:                  Tiajuana Amass

## 2019-02-06 NOTE — Discharge Summary (Signed)
Physician Discharge Summary  Patient ID: Gerald Carpenter MRN: [id number] DOB/AGE: 1950/08/01 68 y.o.  Admit date: 02/05/2019 Discharge date: 02/06/2019  Admission Diagnoses:  Lumbar radiculopathy  Discharge Diagnoses:  Same Active Problems:   Lumbar radiculopathy  Discharged Condition: Stable  Hospital Course:  Gerald Carpenter is a 68 y.o. male who was admitted for the below procedure. Surgery was unable to be completed - see op note. There were no post operative complications. Discharged home for reschedule Tuesday.  Treatments: Surgery  1. Attempted right lateral trans-psoas approach for lateral interbody fusion (aborted) 2. Intraoperative electrophsiologic monitoring   Discharge Exam: Blood pressure (!) 100/56, pulse 66, temperature 97.9 F (36.6 C), temperature source Oral, resp. rate 18, height 5\' 8"  (1.727 m), weight 68 kg, SpO2 96 %. Awake, alert, oriented Speech fluent, appropriate CN grossly intact 5/5 BUE/BLE Wound c/d/i  Disposition: Discharge disposition: 01-Home or Self Care       Discharge Instructions    Call MD for:  difficulty breathing, headache or visual disturbances   Complete by: As directed    Call MD for:  persistant dizziness or light-headedness   Complete by: As directed    Call MD for:  redness, tenderness, or signs of infection (pain, swelling, redness, odor or green/yellow discharge around incision site)   Complete by: As directed    Call MD for:  severe uncontrolled pain   Complete by: As directed    Call MD for:  temperature >100.4   Complete by: As directed    Diet general   Complete by: As directed    Driving Restrictions   Complete by: As directed    Do not drive until given clearance.   Increase activity slowly   Complete by: As directed    Lifting restrictions   Complete by: As directed    Do not lift anything >10lbs. Avoid bending and twisting in awkward positions. Avoid bending at the back.   May shower / Bathe   Complete  by: As directed    In 24 hours. Okay to wash wound with warm soapy water. Avoid scrubbing the wound. Pat dry.   Remove dressing in 24 hours   Complete by: As directed      Allergies as of 02/06/2019   No Known Allergies     Medication List    You have not been prescribed any medications.    Follow-up Information    Lisbeth Renshaw, MD. Schedule an appointment as soon as possible for a visit in 3 week(s).   Specialty: Neurosurgery Contact information: [address] [place] [address] [phone]           Signed: Alyson Ingles 02/06/2019, 2:11 PM

## 2019-02-06 NOTE — Plan of Care (Signed)
Patient alert and oriented, mae's well, voiding adequate amount of urine, swallowing without difficulty, no c/o pain at time of discharge. Patient discharged home with family. Patient and family stated understanding of instructions given. Patient has surgery schedule for next Tuesday.

## 2019-02-06 NOTE — Progress Notes (Signed)
OT Cancellation Note  Patient Details Name: Gerald Carpenter MRN: [id number] DOB: August 04, 1950   Cancelled Treatment:    Reason Eval/Treat Not Completed: Other (comment)(Pt unable to have planned sx so pt re-scheduled.)   Lateral procedure unable to be performed so no spinal sx happened yesterday.  OTR to see pt after re-scheduled surgery next Tuesday. No further OT needs at this time until surgery takes place.  [name] OTR/L Acute Rehabilitation Services Pager: [phone] Office: [phone]   [name] C 02/06/2019, 9:21 AM

## 2019-02-08 ENCOUNTER — Encounter: Payer: Self-pay | Admitting: *Deleted

## 2019-02-09 ENCOUNTER — Encounter (HOSPITAL_COMMUNITY): Payer: Self-pay | Admitting: Neurosurgery

## 2019-02-09 ENCOUNTER — Other Ambulatory Visit: Payer: Self-pay

## 2019-02-09 NOTE — H&P (Addendum)
Chief Complaint   Back pain  HPI   HPI: Gerald Carpenter is a 68 y.o. male with fairly chronic history of back and right greater than left leg pain secondary to broad-based right eccentric disc bulge at L4-5.  He has failed reasonable conservative treatment including medical therapy, physical therapy and injection therapy. On 12/17,  Right lateral trans psoas approach for lateral interbody fusion was attempted.   Unfortunately, surgery was not able to be completed due to close proximity the lumbar plexus to the disc space.  Surgery was therefore aborted. He presents today for L4-5 PLIF. He is without any concerns.   Patient Active Problem List   Diagnosis Date Noted  . Lumbar radiculopathy 02/05/2019    PMH: No past medical history on file.  PSH: Past Surgical History:  Procedure Laterality Date  . ANTERIOR LAT LUMBAR FUSION Right 02/05/2019   Procedure: ATTEMPTED ANTERIOR LATERAL INTERBODY FUSION;  Surgeon: Consuella Lose, MD;  Location: [facility];  Service: Neurosurgery;  Laterality: Right;  RIGHT LATERAL TRANSPSOAS DISCECTOMY AND FUSION WITH ROBOTIC ASSISTED PEDICLE SCREW STABILIZATION, LUMBAR 4- LUMBAR 5  . COLONOSCOPY    . EYE SURGERY Bilateral 2013   lens implants for cataracts after Lasik  . KNEE ARTHROSCOPY Right 1980  . TONSILLECTOMY     at a very young age, but grew back    No medications prior to admission.    SH: Social History   Tobacco Use  . Smoking status: Current Some Day Smoker    Packs/day: 0.50    Years: 60.00    Pack years: 30.00    Types: Cigarettes  . Smokeless tobacco: Never Used  Substance Use Topics  . 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 Drug use: Not Currently    Comment: prior usage of "speed"  when drinking    MEDS: Prior to Admission medications   Medication Sig Start Date End Date Taking? Authorizing Provider  oxyCODONE-acetaminophen (PERCOCET/ROXICET) 5-325 MG tablet Take 1 tablet by mouth  every 4 (four) hours as needed for severe pain. Patient takes  2 times a day-   Yes [provider]    ALLERGY: No Known Allergies  Social History   Tobacco Use  . Smoking status: Current Some Day Smoker    Packs/day: 0.50    Years: 60.00    Pack years: 30.00    Types: Cigarettes  . Smokeless tobacco: Never Used  Substance Use Topics  . 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   ROS   ROS  Exam   There were no vitals filed for this visit. General appearance: WDWN, NAD Eyes: No scleral injection Cardiovascular: Regular rate and rhythm without murmurs, rubs, gallops. No edema or variciosities. Distal pulses normal. Pulmonary: Effort normal, non-labored breathing Musculoskeletal:     Muscle tone upper extremities: Normal    Muscle tone lower extremities: Normal    Motor exam: Upper Extremities Deltoid Bicep Tricep Grip  Right 5/5 5/5 5/5 5/5  Left 5/5 5/5 5/5 5/5   Lower Extremity IP Quad PF DF EHL  Right 5/5 5/5 5/5 5/5 5/5  Left 5/5 5/5 5/5 5/5 5/5   Neurological Mental Status:    - Patient is awake, alert, oriented to person, place, month, year, and situation    - Patient is able to give a clear and coherent history.    -  No signs of aphasia or neglect Cranial Nerves    - II: Visual Fields are full. PERRL    - III/IV/VI: EOMI without ptosis or diploplia.     - V: Facial sensation is grossly normal    - VII: Facial movement is symmetric.     - VIII: hearing is intact to voice    - X: Uvula elevates symmetrically    - XI: Shoulder shrug is symmetric.    - XII: tongue is midline without atrophy or fasciculations.  Sensory: Sensation grossly intact to LT   Results - Imaging/Labs   No results found for this or any previous visit (from the past 48 hour(s)).  No results found.  IMAGING: MRI of the lumbar spine dated 10/03/2018 was again reviewed.  This demonstrates primary finding at  L4-5 where there is disc desiccation with loss of height.  There is resultant primarily right-sided foraminal stenosis due to foraminal collapse and right eccentric disc bulge.  Impression/Plan   68 y.o. male with symptoms of lumbar radiculopathy related to disc height loss and foraminal collapse with broad-based right eccentric disc bulge worse on the right at L4-5. Unfortunately, he has not responded to reasonable conservative treatment including medical therapy, physical therapy, and injection therapy.    We will proceed with posterior lumbar interbody fusion at L4-5.  I have reviewed the risks of surgery with the patient in detail. All his questions today were answered and consent was obtained.  Consuella Lose, MD [facility]

## 2019-02-10 ENCOUNTER — Inpatient Hospital Stay (HOSPITAL_COMMUNITY): Payer: Medicare Other

## 2019-02-10 ENCOUNTER — Encounter (HOSPITAL_COMMUNITY)
Admission: RE | Disposition: A | Discharge status: Home / Self Care | Payer: Self-pay | Source: Home / Self Care | Attending: Neurosurgery

## 2019-02-10 ENCOUNTER — Other Ambulatory Visit: Payer: Self-pay

## 2019-02-10 ENCOUNTER — Encounter (HOSPITAL_COMMUNITY): Payer: Self-pay | Admitting: Neurosurgery

## 2019-02-10 ENCOUNTER — Observation Stay (HOSPITAL_COMMUNITY)
Admission: RE | Admit: 2019-02-10 | Discharge: 2019-02-11 | Disposition: A | Discharge status: Home / Self Care | Payer: Medicare Other | Attending: Neurosurgery | Admitting: Neurosurgery

## 2019-02-10 ENCOUNTER — Other Ambulatory Visit (HOSPITAL_COMMUNITY)
Admission: RE | Admit: 2019-02-10 | Discharge: 2019-02-10 | Disposition: A | Discharge status: Home / Self Care | Payer: Medicare Other | Source: Ambulatory Visit | Attending: Neurosurgery | Admitting: Neurosurgery

## 2019-02-10 DIAGNOSIS — Z20828 Contact with and (suspected) exposure to other viral communicable diseases: Secondary | ICD-10-CM | POA: Diagnosis not present

## 2019-02-10 DIAGNOSIS — F17210 Nicotine dependence, cigarettes, uncomplicated: Secondary | ICD-10-CM | POA: Diagnosis not present

## 2019-02-10 DIAGNOSIS — M5416 Radiculopathy, lumbar region: Secondary | ICD-10-CM | POA: Diagnosis present

## 2019-02-10 DIAGNOSIS — M48061 Spinal stenosis, lumbar region without neurogenic claudication: Secondary | ICD-10-CM | POA: Diagnosis not present

## 2019-02-10 DIAGNOSIS — Z419 Encounter for procedure for purposes other than remedying health state, unspecified: Secondary | ICD-10-CM

## 2019-02-10 LAB — TYPE AND SCREEN
ABO/RH(D): A POS
ABO/RH(D): A POS
Antibody Screen: NEGATIVE
Antibody Screen: NEGATIVE

## 2019-02-10 LAB — SURGICAL PCR SCREEN
MRSA, PCR: NEGATIVE
Staphylococcus aureus: NEGATIVE

## 2019-02-10 LAB — RESPIRATORY PANEL BY RT PCR (FLU A&B, COVID)
Influenza A by PCR: NEGATIVE
Influenza B by PCR: NEGATIVE
SARS Coronavirus 2 by RT PCR: NEGATIVE

## 2019-02-10 SURGERY — POSTERIOR LUMBAR FUSION 1 LEVEL
Anesthesia: General | Site: Spine Lumbar

## 2019-02-10 MED ORDER — SODIUM CHLORIDE 0.9 % IV SOLN: INTRAVENOUS | Status: DC

## 2019-02-10 MED ORDER — ONDANSETRON HCL 4 MG PO TABS: 4.0000 mg | ORAL_TABLET | Freq: Four times a day (QID) | ORAL | Status: DC | PRN

## 2019-02-10 MED ORDER — PHENYLEPHRINE HCL-NACL 10-0.9 MG/250ML-% IV SOLN
INTRAVENOUS | Status: DC | PRN
  Administered 2019-02-10: 20 ug/min via INTRAVENOUS

## 2019-02-10 MED ORDER — OXYCODONE HCL 5 MG PO TABS
5.0000 mg | ORAL_TABLET | ORAL | Status: DC | PRN
  Administered 2019-02-10 – 2019-02-11 (×4): 10 mg via ORAL
  Filled 2019-02-10 (×3): qty 2

## 2019-02-10 MED ORDER — LIDOCAINE-EPINEPHRINE 1 %-1:100000 IJ SOLN
INTRAMUSCULAR | Status: AC
  Filled 2019-02-10: qty 1

## 2019-02-10 MED ORDER — BISACODYL 10 MG RE SUPP: 10.0000 mg | Freq: Every day | RECTAL | Status: DC | PRN

## 2019-02-10 MED ORDER — DEXAMETHASONE SODIUM PHOSPHATE 10 MG/ML IJ SOLN
INTRAMUSCULAR | Status: AC
  Filled 2019-02-10: qty 1

## 2019-02-10 MED ORDER — THROMBIN 5000 UNITS EX SOLR
OROMUCOSAL | Status: DC | PRN
  Administered 2019-02-10: 5 mL via TOPICAL

## 2019-02-10 MED ORDER — MIDAZOLAM HCL 5 MG/5ML IJ SOLN
INTRAMUSCULAR | Status: DC | PRN
  Administered 2019-02-10: 2 mg via INTRAVENOUS

## 2019-02-10 MED ORDER — ZOLPIDEM TARTRATE 5 MG PO TABS: 5.0000 mg | ORAL_TABLET | Freq: Every evening | ORAL | Status: DC | PRN

## 2019-02-10 MED ORDER — LIDOCAINE-EPINEPHRINE 1 %-1:100000 IJ SOLN
INTRAMUSCULAR | Status: DC | PRN
  Administered 2019-02-10: 9 mL

## 2019-02-10 MED ORDER — THROMBIN 5000 UNITS EX SOLR
CUTANEOUS | Status: AC
  Filled 2019-02-10: qty 5000

## 2019-02-10 MED ORDER — ACETAMINOPHEN 160 MG/5ML PO SOLN: 325.0000 mg | Freq: Once | ORAL | Status: DC | PRN

## 2019-02-10 MED ORDER — SODIUM CHLORIDE 0.9 % IV SOLN: 250.0000 mL | INTRAVENOUS | Status: DC

## 2019-02-10 MED ORDER — LACTATED RINGERS IV SOLN: INTRAVENOUS | Status: DC

## 2019-02-10 MED ORDER — METHOCARBAMOL 1000 MG/10ML IJ SOLN
500.0000 mg | Freq: Four times a day (QID) | INTRAVENOUS | Status: DC | PRN
  Filled 2019-02-10: qty 5

## 2019-02-10 MED ORDER — ONDANSETRON HCL 4 MG/2ML IJ SOLN
INTRAMUSCULAR | Status: AC
  Filled 2019-02-10: qty 2

## 2019-02-10 MED ORDER — MENTHOL 3 MG MT LOZG: 1.0000 | LOZENGE | OROMUCOSAL | Status: DC | PRN

## 2019-02-10 MED ORDER — ROCURONIUM BROMIDE 100 MG/10ML IV SOLN
INTRAVENOUS | Status: DC | PRN
  Administered 2019-02-10 (×2): 10 mg via INTRAVENOUS
  Administered 2019-02-10: 30 mg via INTRAVENOUS
  Administered 2019-02-10: 50 mg via INTRAVENOUS

## 2019-02-10 MED ORDER — BUPIVACAINE HCL (PF) 0.5 % IJ SOLN
INTRAMUSCULAR | Status: DC | PRN
  Administered 2019-02-10: 9 mL

## 2019-02-10 MED ORDER — SENNA 8.6 MG PO TABS
1.0000 | ORAL_TABLET | Freq: Two times a day (BID) | ORAL | Status: DC
  Administered 2019-02-10: 8.6 mg via ORAL
  Filled 2019-02-10: qty 1

## 2019-02-10 MED ORDER — ACETAMINOPHEN 650 MG RE SUPP: 650.0000 mg | RECTAL | Status: DC | PRN

## 2019-02-10 MED ORDER — ACETAMINOPHEN 10 MG/ML IV SOLN: 1000.0000 mg | Freq: Once | INTRAVENOUS | Status: DC | PRN

## 2019-02-10 MED ORDER — BUPIVACAINE HCL (PF) 0.5 % IJ SOLN
INTRAMUSCULAR | Status: AC
  Filled 2019-02-10: qty 30

## 2019-02-10 MED ORDER — SODIUM CHLORIDE 0.9% FLUSH: 3.0000 mL | INTRAVENOUS | Status: DC | PRN

## 2019-02-10 MED ORDER — SODIUM CHLORIDE 0.9 % IV SOLN
INTRAVENOUS | Status: DC | PRN
  Administered 2019-02-10: 500 mL

## 2019-02-10 MED ORDER — ONDANSETRON HCL 4 MG/2ML IJ SOLN
INTRAMUSCULAR | Status: DC | PRN
  Administered 2019-02-10: 4 mg via INTRAVENOUS

## 2019-02-10 MED ORDER — THROMBIN 20000 UNITS EX SOLR
CUTANEOUS | Status: AC
  Filled 2019-02-10: qty 20000

## 2019-02-10 MED ORDER — HYDROMORPHONE HCL 1 MG/ML IJ SOLN: 0.5000 mg | INTRAMUSCULAR | Status: DC | PRN

## 2019-02-10 MED ORDER — FENTANYL CITRATE (PF) 100 MCG/2ML IJ SOLN
INTRAMUSCULAR | Status: DC | PRN
  Administered 2019-02-10 (×3): 50 ug via INTRAVENOUS

## 2019-02-10 MED ORDER — METHOCARBAMOL 500 MG PO TABS
500.0000 mg | ORAL_TABLET | Freq: Four times a day (QID) | ORAL | Status: DC | PRN
  Administered 2019-02-10 – 2019-02-11 (×3): 500 mg via ORAL
  Filled 2019-02-10 (×2): qty 1

## 2019-02-10 MED ORDER — ACETAMINOPHEN 500 MG PO TABS
1000.0000 mg | ORAL_TABLET | Freq: Four times a day (QID) | ORAL | Status: DC
  Administered 2019-02-10 – 2019-02-11 (×2): 1000 mg via ORAL
  Filled 2019-02-10 (×2): qty 2

## 2019-02-10 MED ORDER — HYDROMORPHONE HCL 1 MG/ML IJ SOLN
INTRAMUSCULAR | Status: AC
  Filled 2019-02-10: qty 1

## 2019-02-10 MED ORDER — PROPOFOL 10 MG/ML IV BOLUS
INTRAVENOUS | Status: DC | PRN
  Administered 2019-02-10: 110 mg via INTRAVENOUS

## 2019-02-10 MED ORDER — 0.9 % SODIUM CHLORIDE (POUR BTL) OPTIME
TOPICAL | Status: DC | PRN
  Administered 2019-02-10: 1000 mL

## 2019-02-10 MED ORDER — CEFAZOLIN SODIUM-DEXTROSE 2-4 GM/100ML-% IV SOLN
2.0000 g | INTRAVENOUS | Status: AC
  Administered 2019-02-10: 2 g via INTRAVENOUS
  Filled 2019-02-10: qty 100

## 2019-02-10 MED ORDER — PHENYLEPHRINE 40 MCG/ML (10ML) SYRINGE FOR IV PUSH (FOR BLOOD PRESSURE SUPPORT)
PREFILLED_SYRINGE | INTRAVENOUS | Status: AC
  Filled 2019-02-10: qty 10

## 2019-02-10 MED ORDER — LIDOCAINE 2% (20 MG/ML) 5 ML SYRINGE
INTRAMUSCULAR | Status: DC | PRN
  Administered 2019-02-10: 40 mg via INTRAVENOUS

## 2019-02-10 MED ORDER — HYDROCODONE-ACETAMINOPHEN 5-325 MG PO TABS: 1.0000 | ORAL_TABLET | ORAL | Status: DC | PRN

## 2019-02-10 MED ORDER — SODIUM CHLORIDE 0.9% FLUSH: 3.0000 mL | Freq: Two times a day (BID) | INTRAVENOUS | Status: DC

## 2019-02-10 MED ORDER — SUGAMMADEX SODIUM 200 MG/2ML IV SOLN
INTRAVENOUS | Status: DC | PRN
  Administered 2019-02-10: 150 mg via INTRAVENOUS

## 2019-02-10 MED ORDER — PROMETHAZINE HCL 25 MG/ML IJ SOLN: 6.2500 mg | INTRAMUSCULAR | Status: DC | PRN

## 2019-02-10 MED ORDER — FENTANYL CITRATE (PF) 250 MCG/5ML IJ SOLN
INTRAMUSCULAR | Status: AC
  Filled 2019-02-10: qty 5

## 2019-02-10 MED ORDER — CEFAZOLIN SODIUM-DEXTROSE 2-4 GM/100ML-% IV SOLN
2.0000 g | Freq: Three times a day (TID) | INTRAVENOUS | Status: AC
  Administered 2019-02-10 – 2019-02-11 (×2): 2 g via INTRAVENOUS
  Filled 2019-02-10 (×2): qty 100

## 2019-02-10 MED ORDER — ACETAMINOPHEN 325 MG PO TABS
650.0000 mg | ORAL_TABLET | ORAL | Status: DC | PRN
  Administered 2019-02-10 – 2019-02-11 (×2): 650 mg via ORAL
  Filled 2019-02-10 (×2): qty 2

## 2019-02-10 MED ORDER — METHOCARBAMOL 500 MG PO TABS
ORAL_TABLET | ORAL | Status: AC
  Filled 2019-02-10: qty 1

## 2019-02-10 MED ORDER — CHLORHEXIDINE GLUCONATE CLOTH 2 % EX PADS: 6.0000 | MEDICATED_PAD | Freq: Once | CUTANEOUS | Status: DC

## 2019-02-10 MED ORDER — DEXAMETHASONE SODIUM PHOSPHATE 10 MG/ML IJ SOLN
INTRAMUSCULAR | Status: DC | PRN
  Administered 2019-02-10: 10 mg via INTRAVENOUS

## 2019-02-10 MED ORDER — PHENYLEPHRINE 40 MCG/ML (10ML) SYRINGE FOR IV PUSH (FOR BLOOD PRESSURE SUPPORT)
PREFILLED_SYRINGE | INTRAVENOUS | Status: DC | PRN
  Administered 2019-02-10: 80 ug via INTRAVENOUS

## 2019-02-10 MED ORDER — LIDOCAINE 2% (20 MG/ML) 5 ML SYRINGE
INTRAMUSCULAR | Status: AC
  Filled 2019-02-10: qty 5

## 2019-02-10 MED ORDER — SENNOSIDES-DOCUSATE SODIUM 8.6-50 MG PO TABS: 1.0000 | ORAL_TABLET | Freq: Every evening | ORAL | Status: DC | PRN

## 2019-02-10 MED ORDER — MEPERIDINE HCL 25 MG/ML IJ SOLN: 6.2500 mg | INTRAMUSCULAR | Status: DC | PRN

## 2019-02-10 MED ORDER — MIDAZOLAM HCL 2 MG/2ML IJ SOLN
INTRAMUSCULAR | Status: AC
  Filled 2019-02-10: qty 2

## 2019-02-10 MED ORDER — HYDROMORPHONE HCL 1 MG/ML IJ SOLN
0.2500 mg | INTRAMUSCULAR | Status: DC | PRN
  Administered 2019-02-10: 1 mg via INTRAVENOUS

## 2019-02-10 MED ORDER — ACETAMINOPHEN 325 MG PO TABS: 325.0000 mg | ORAL_TABLET | Freq: Once | ORAL | Status: DC | PRN

## 2019-02-10 MED ORDER — FLEET ENEMA 7-19 GM/118ML RE ENEM: 1.0000 | ENEMA | Freq: Once | RECTAL | Status: DC | PRN

## 2019-02-10 MED ORDER — PROPOFOL 10 MG/ML IV BOLUS
INTRAVENOUS | Status: AC
  Filled 2019-02-10: qty 20

## 2019-02-10 MED ORDER — OXYCODONE HCL 5 MG PO TABS
ORAL_TABLET | ORAL | Status: AC
  Filled 2019-02-10: qty 2

## 2019-02-10 MED ORDER — ONDANSETRON HCL 4 MG/2ML IJ SOLN: 4.0000 mg | Freq: Four times a day (QID) | INTRAMUSCULAR | Status: DC | PRN

## 2019-02-10 MED ORDER — DOCUSATE SODIUM 100 MG PO CAPS
100.0000 mg | ORAL_CAPSULE | Freq: Two times a day (BID) | ORAL | Status: DC
  Administered 2019-02-10: 20:00:00 100 mg via ORAL
  Filled 2019-02-10: qty 1

## 2019-02-10 MED ORDER — PHENOL 1.4 % MT LIQD: 1.0000 | OROMUCOSAL | Status: DC | PRN

## 2019-02-10 SURGICAL SUPPLY — 76 items
BAG DECANTER FOR FLEXI CONT (MISCELLANEOUS) ×2 IMPLANT
BASKET BONE COLLECTION (BASKET) ×2 IMPLANT
BENZOIN TINCTURE PRP APPL 2/3 (GAUZE/BANDAGES/DRESSINGS) IMPLANT
BLADE CLIPPER SURG (BLADE) ×1 IMPLANT
BLADE SURG 11 STRL SS (BLADE) ×2 IMPLANT
BUR MATCHSTICK NEURO 3.0 LAGG (BURR) ×2 IMPLANT
BUR PRECISION FLUTE 5.0 (BURR) ×2 IMPLANT
CANISTER SUCT 3000ML PPV (MISCELLANEOUS) ×2 IMPLANT
CARTRIDGE OIL MAESTRO DRILL (MISCELLANEOUS) ×1 IMPLANT
CONT SPEC 4OZ CLIKSEAL STRL BL (MISCELLANEOUS) ×2 IMPLANT
COVER BACK TABLE 60X90IN (DRAPES) ×2 IMPLANT
COVER WAND RF STERILE (DRAPES) ×2 IMPLANT
DECANTER SPIKE VIAL GLASS SM (MISCELLANEOUS) ×2 IMPLANT
DERMABOND ADHESIVE PROPEN (GAUZE/BANDAGES/DRESSINGS) ×1
DERMABOND ADVANCED (GAUZE/BANDAGES/DRESSINGS) ×1
DERMABOND ADVANCED .7 DNX12 (GAUZE/BANDAGES/DRESSINGS) ×1 IMPLANT
DERMABOND ADVANCED .7 DNX6 (GAUZE/BANDAGES/DRESSINGS) IMPLANT
DIFFUSER DRILL AIR PNEUMATIC (MISCELLANEOUS) ×2 IMPLANT
DRAPE C-ARM 42X72 X-RAY (DRAPES) ×2 IMPLANT
DRAPE C-ARMOR (DRAPES) ×2 IMPLANT
DRAPE LAPAROTOMY 100X72X124 (DRAPES) ×2 IMPLANT
DRAPE SURG 17X23 STRL (DRAPES) ×2 IMPLANT
DRSG OPSITE 4X5.5 SM (GAUZE/BANDAGES/DRESSINGS) ×1 IMPLANT
DRSG TELFA 3X8 NADH (GAUZE/BANDAGES/DRESSINGS) ×2 IMPLANT
DURAPREP 26ML APPLICATOR (WOUND CARE) ×2 IMPLANT
ELECT REM PT RETURN 9FT ADLT (ELECTROSURGICAL) ×2
ELECTRODE REM PT RTRN 9FT ADLT (ELECTROSURGICAL) ×1 IMPLANT
GAUZE 4X4 16PLY RFD (DISPOSABLE) IMPLANT
GAUZE SPONGE 4X4 12PLY STRL (GAUZE/BANDAGES/DRESSINGS) IMPLANT
GLOVE BIO SURGEON STRL SZ7 (GLOVE) ×1 IMPLANT
GLOVE BIO SURGEON STRL SZ7.5 (GLOVE) ×2 IMPLANT
GLOVE BIOGEL PI IND STRL 7.5 (GLOVE) ×2 IMPLANT
GLOVE BIOGEL PI INDICATOR 7.5 (GLOVE) ×3
GLOVE ECLIPSE 7.0 STRL STRAW (GLOVE) ×4 IMPLANT
GLOVE EXAM NITRILE XL STR (GLOVE) IMPLANT
GLOVE INDICATOR 7.5 STRL GRN (GLOVE) ×4 IMPLANT
GLOVE SURG SS PI 7.5 STRL IVOR (GLOVE) ×1 IMPLANT
GOWN STRL REUS W/ TWL LRG LVL3 (GOWN DISPOSABLE) ×4 IMPLANT
GOWN STRL REUS W/ TWL XL LVL3 (GOWN DISPOSABLE) IMPLANT
GOWN STRL REUS W/TWL 2XL LVL3 (GOWN DISPOSABLE) IMPLANT
GOWN STRL REUS W/TWL LRG LVL3 (GOWN DISPOSABLE) ×4
GOWN STRL REUS W/TWL XL LVL3 (GOWN DISPOSABLE) ×2
HEMOSTAT POWDER KIT SURGIFOAM (HEMOSTASIS) ×2 IMPLANT
KIT BASIN OR (CUSTOM PROCEDURE TRAY) ×2 IMPLANT
KIT INFUSE XX SMALL 0.7CC (Orthopedic Implant) ×1 IMPLANT
KIT POSITION SURG JACKSON T1 (MISCELLANEOUS) ×2 IMPLANT
KIT TURNOVER KIT B (KITS) ×2 IMPLANT
MILL MEDIUM DISP (BLADE) ×2 IMPLANT
NDL HYPO 18GX1.5 BLUNT FILL (NEEDLE) IMPLANT
NDL SPNL 18GX3.5 QUINCKE PK (NEEDLE) IMPLANT
NEEDLE HYPO 18GX1.5 BLUNT FILL (NEEDLE) IMPLANT
NEEDLE HYPO 22GX1.5 SAFETY (NEEDLE) ×2 IMPLANT
NEEDLE SPNL 18GX3.5 QUINCKE PK (NEEDLE) ×2 IMPLANT
NS IRRIG 1000ML POUR BTL (IV SOLUTION) ×2 IMPLANT
OIL CARTRIDGE MAESTRO DRILL (MISCELLANEOUS) ×2
PACK LAMINECTOMY NEURO (CUSTOM PROCEDURE TRAY) ×2 IMPLANT
PAD ARMBOARD 7.5X6 YLW CONV (MISCELLANEOUS) ×3 IMPLANT
PAD DRESSING TELFA 3X8 NADH (GAUZE/BANDAGES/DRESSINGS) IMPLANT
ROD LORD LIPPED TI 5.5X40 (Rod) ×2 IMPLANT
SCREW CANC SHANK MOD 5.5X35 (Screw) ×4 IMPLANT
SCREW POLYAXIAL TULIP (Screw) ×4 IMPLANT
SET SCREW (Screw) ×4 IMPLANT
SET SCREW SPNE (Screw) IMPLANT
SPACER IDENTITI PS 9X9X25 10D (Spacer) ×2 IMPLANT
SPONGE LAP 4X18 RFD (DISPOSABLE) IMPLANT
SPONGE SURGIFOAM ABS GEL 100 (HEMOSTASIS) IMPLANT
STRIP CLOSURE SKIN 1/2X4 (GAUZE/BANDAGES/DRESSINGS) IMPLANT
SUT VIC AB 0 CT1 18XCR BRD8 (SUTURE) ×1 IMPLANT
SUT VIC AB 0 CT1 8-18 (SUTURE) ×2
SUT VICRYL 3-0 RB1 18 ABS (SUTURE) ×3 IMPLANT
SYR 3ML LL SCALE MARK (SYRINGE) ×6 IMPLANT
TOWEL GREEN STERILE (TOWEL DISPOSABLE) ×2 IMPLANT
TOWEL GREEN STERILE FF (TOWEL DISPOSABLE) ×2 IMPLANT
TRAY FOLEY MTR SLVR 14FR STAT (SET/KITS/TRAYS/PACK) ×1 IMPLANT
TRAY FOLEY MTR SLVR 16FR STAT (SET/KITS/TRAYS/PACK) ×1 IMPLANT
WATER STERILE IRR 1000ML POUR (IV SOLUTION) ×2 IMPLANT

## 2019-02-10 NOTE — Transfer of Care (Signed)
Immediate Anesthesia Transfer of Care Note  Patient: Gerald Carpenter  Procedure(s) Performed: POSTERIOR LUMBAR INTERBODY FUSION lUMBAR Four- LUMBAR Five (N/A Spine Lumbar)  Patient Location: PACU  Anesthesia Type:General  Level of Consciousness: drowsy  Airway & Oxygen Therapy: Patient Spontanous Breathing and Patient connected to nasal cannula oxygen  Post-op Assessment: Report given to RN and Post -op Vital signs reviewed and stable  Post vital signs: Reviewed and stable  Last Vitals:  Vitals Value Taken Time  BP 117/63 02/10/19 1542  Temp    Pulse 80 02/10/19 1543  Resp 17 02/10/19 1543  SpO2 99 % 02/10/19 1543  Vitals shown include unvalidated device data.  Last Pain: There were no vitals filed for this visit.       Complications: No apparent anesthesia complications

## 2019-02-10 NOTE — Anesthesia Postprocedure Evaluation (Signed)
Anesthesia Post Note  Patient: Gerald Carpenter  Procedure(s) Performed: POSTERIOR LUMBAR INTERBODY FUSION lUMBAR Four- LUMBAR Five (N/A Spine Lumbar)     Patient location during evaluation: PACU Anesthesia Type: General Level of consciousness: awake and alert Pain management: pain level controlled Vital Signs Assessment: post-procedure vital signs reviewed and stable Respiratory status: spontaneous breathing, nonlabored ventilation, respiratory function stable and patient connected to nasal cannula oxygen Cardiovascular status: blood pressure returned to baseline and stable Postop Assessment: no apparent nausea or vomiting Anesthetic complications: no    Last Vitals:  Vitals:   02/10/19 1600 02/10/19 1642  BP: 111/66 130/68  Pulse: 81 73  Resp: 13 16  Temp:    SpO2: 93% 96%    Last Pain:  Vitals:   02/10/19 1645  PainSc: 3                  Effie Berkshire

## 2019-02-10 NOTE — Anesthesia Procedure Notes (Signed)
Procedure Name: Intubation Date/Time: 02/10/2019 12:51 PM Performed by: Candis Shine, CRNA Pre-anesthesia Checklist: Patient identified, Emergency Drugs available, Suction available and Patient being monitored Patient Re-evaluated:Patient Re-evaluated prior to induction Oxygen Delivery Method: Circle System Utilized Preoxygenation: Pre-oxygenation with 100% oxygen Induction Type: IV induction Ventilation: Mask ventilation without difficulty and Oral airway inserted - appropriate to patient size Laryngoscope Size: Sabra Heck and 2 Grade View: Grade I Tube type: Oral Tube size: 7.5 mm Number of attempts: 1 Airway Equipment and Method: Stylet and Oral airway Placement Confirmation: ETT inserted through vocal cords under direct vision,  positive ETCO2 and breath sounds checked- equal and bilateral Secured at: 22 cm Tube secured with: Tape Dental Injury: Teeth and Oropharynx as per pre-operative assessment

## 2019-02-10 NOTE — Op Note (Addendum)
NEUROSURGERY OPERATIVE NOTE   PREOP DIAGNOSIS:  1. Lumbar stenosis, L4-5 2. Lumbago with radiculopathy  POSTOP DIAGNOSIS: Same  PROCEDURE: 1. L4 laminectomy with facetectomy for decompression of exiting nerve roots, more than would be required for placement of interbody graft 2. Placement of anterior interbody device - Alphatec 69mm x 41mm 10 deg lordotic cage x2 3. Posterior non-segmental instrumentation using cortical pedicle screws at L4 - L5 4. Interbody arthrodesis, L4-5 5. Use of locally harvested bone autograft 6. Use of non-structural bone allograft - BMP  SURGEON: Dr. Consuella Lose, MD  ASSISTANT: Ferne Reus, PA-C  ANESTHESIA: General Endotracheal  EBL: 125cc  SPECIMENS: None  DRAINS: None  COMPLICATIONS: None immediate  CONDITION: Hemodynamically stable to PACU  HISTORY: Gerald Carpenter is a 68 y.o. male who has been followed in the outpatient clinic with back and leg pain related to spondylosis with primarily lateral recess/foraminal stenosis at L4-5. Multiple conservative treatments were attempted without significant improvement and we ultimately elected to proceed with surgical decompression and fusion. We were unable to perform a lateral discectomy and fusion last week. We therefore discussed proceeding with posterior decompression and fusion. Risks, benefits, and alternative treatments were reviewed in detail. After all questions were answered, informed consent was obtained and witnessed.  PROCEDURE IN DETAIL: The patient was brought to the operating room via stretcher. After induction of general anesthesia, the patient was positioned on the operative table in the prone position. All pressure points were meticulously padded. Incision was then marked out and prepped and draped in the usual sterile fashion.  After timeout was conducted, skin was infiltrated with local anesthetic. Skin incision was then made sharply and Bovie electrocautery was used to  dissect the subcutaneous tissue until the lumbodorsal fascia was identified and incised. The muscle was then elevated in the subperiosteal plane and the L4 lamina and L4-5 facet complexes were identified. Self-retaining retractors were then placed. Lateral fluoroscopy was taken with a dissector in the L4-5 interspace to confirm our location.  At this point attention was turned to decompression. Complete L4 laminectomy was completed with a high-speed drill and Kerrison punches.  Normal dura was identified.  A ball-tipped dissector was then used to identify the foramina bilaterally.  High-speed drill was used to cut across the pars interarticularis and the inferior articulating process of L4 was removed bilaterally.  The exiting nerve roots and the traversing nerve roots were then identified.  I was then able to easily pass a ball dissector in the ventral epidural space and underneath the bilateral L4 and L5 nerves indicating good decompression.   Disc space was then identified, incised bilaterally, and using a combination of shavers, curettes and rongeurs, complete discectomy was completed. Endplates were prepared, and bone harvested during decompression was mixed with BMP and packed into the interspace. A cage cage was tapped into place and rotated to achieve a height of 60mm anteriorly with 10degrees lordosis. The same procedure was completed on the contralateral side.  Good position was confirmed with fluoroscopy.  At this point, the entry points for bilateral L4 and L5 cortical pedicle screws were identified using standard anatomic landmarks and lateral fluoro. Pilot holes were then drilled and tapped to 5.5 x 29mm. Screws were then placed in L4 and L5. Prebent lordotic rod was then sized and placed into the pedicle screws. Set screws were placed and final tightened. Final AP and lateral fluoroscopic images confirmed good position.  Hemostasis was secured and confirmed with bipolar cautery and morcellized  gelfoam with  thrombin. The wound was then irrigated with copious amounts of antibiotic saline, then closed in standard fashion using a combination of interrupted 0 and 3-0 Vicryl stitches in the muscular, fascial, and subcutaneous layers. Skin was then closed using standard Dermabond. Sterile dressing was then applied. The patient was then transferred to the stretcher, extubated, and taken to the postanesthesia care unit in stable hemodynamic condition.  At the end of the case all sponge, needle, cottonoid, and instrument counts were correct.

## 2019-02-10 NOTE — Anesthesia Preprocedure Evaluation (Addendum)
Anesthesia Evaluation  Patient identified by MRN, date of birth, ID band Patient awake    Reviewed: Allergy & Precautions, NPO status , Patient's Chart, lab work & pertinent test results  Airway Mallampati: I  TM Distance: >3 FB Neck ROM: Full    Dental  (+) Edentulous Upper, Edentulous Lower   Pulmonary Current Smoker and Patient abstained from smoking.,    Pulmonary exam normal        Cardiovascular negative cardio ROS   Rhythm:Regular Rate:Normal     Neuro/Psych  Neuromuscular disease negative psych ROS   GI/Hepatic negative GI ROS, Neg liver ROS,   Endo/Other  negative endocrine ROS  Renal/GU negative Renal ROS     Musculoskeletal negative musculoskeletal ROS (+)   Abdominal Normal abdominal exam  (+)   Peds  Hematology negative hematology ROS (+)   Anesthesia Other Findings   Reproductive/Obstetrics                            Anesthesia Physical Anesthesia Plan  ASA: II  Anesthesia Plan: General   Post-op Pain Management:    Induction: Intravenous  PONV Risk Score and Plan: 2 and Ondansetron, Dexamethasone and Midazolam  Airway Management Planned: Oral ETT  Additional Equipment: None  Intra-op Plan:   Post-operative Plan: Extubation in OR  Informed Consent: I have reviewed the patients History and Physical, chart, labs and discussed the procedure including the risks, benefits and alternatives for the proposed anesthesia with the patient or authorized representative who has indicated his/her understanding and acceptance.       Plan Discussed with:   Anesthesia Plan Comments:        Anesthesia Quick Evaluation

## 2019-02-11 DIAGNOSIS — M48061 Spinal stenosis, lumbar region without neurogenic claudication: Secondary | ICD-10-CM | POA: Diagnosis not present

## 2019-02-11 LAB — BASIC METABOLIC PANEL
Anion gap: 8 (ref 5–15)
BUN: 14 mg/dL (ref 8–23)
CO2: 25 mmol/L (ref 22–32)
Calcium: 8.2 mg/dL — ABNORMAL LOW (ref 8.9–10.3)
Chloride: 99 mmol/L (ref 98–111)
Creatinine, Ser: 1.19 mg/dL (ref 0.61–1.24)
GFR calc Af Amer: >60 mL/min (ref >60)
GFR calc non Af Amer: >60 mL/min (ref >60)
Glucose, Bld: 191 mg/dL — ABNORMAL HIGH (ref 70–99)
Potassium: 4.4 mmol/L (ref 3.5–5.1)
Sodium: 132 mmol/L — ABNORMAL LOW (ref 135–145)

## 2019-02-11 LAB — APTT: aPTT: 28 seconds (ref 24–36)

## 2019-02-11 LAB — CBC
HCT: 42.8 % (ref 39.0–52.0)
Hemoglobin: 14.1 g/dL (ref 13.0–17.0)
MCH: 31.3 pg (ref 26.0–34.0)
MCHC: 32.9 g/dL (ref 30.0–36.0)
MCV: 94.9 fL (ref 80.0–100.0)
Platelets: 222 10*3/uL (ref 150–400)
RBC: 4.51 MIL/uL (ref 4.22–5.81)
RDW: 14.1 % (ref 11.5–15.5)
WBC: 23.7 10*3/uL — ABNORMAL HIGH (ref 4.0–10.5)
nRBC: 0 % (ref 0.0–0.2)

## 2019-02-11 LAB — PROTIME-INR
INR: 1 (ref 0.8–1.2)
Prothrombin Time: 13.4 seconds (ref 11.4–15.2)

## 2019-02-11 MED ORDER — OXYCODONE-ACETAMINOPHEN 7.5-325 MG PO TABS: 1.0000 | ORAL_TABLET | ORAL | 0 refills | Status: DC | PRN

## 2019-02-11 MED ORDER — METHOCARBAMOL 750 MG PO TABS: 750.0000 mg | ORAL_TABLET | Freq: Three times a day (TID) | ORAL | 1 refills | Status: DC | PRN

## 2019-02-11 NOTE — Discharge Summary (Signed)
Physician Discharge Summary  Patient ID: Gerald Carpenter MRN: [id number] DOB/AGE: Apr 07, 1950 68 y.o.  Admit date: 02/10/2019 Discharge date: 02/11/2019  Admission Diagnoses:  Lumbar radiculopathy  Discharge Diagnoses:  Same Active Problems:   Lumbar radiculopathy   Discharged Condition: Stable  Hospital Course:  Gerald Carpenter is a 68 y.o. male who was admitted for the below procedure. There were no post operative complications. At time of discharge, pain was well controlled, ambulating with Pt/OT, tolerating po, voiding normal. Ready for discharge.  Treatments: Surgery -  L4-5 PLIF  Discharge Exam: Blood pressure (!) 100/54, pulse 71, temperature 98 F (36.7 C), temperature source Oral, resp. rate 18, height 5\' 8"  (1.727 m), weight 68 kg, SpO2 94 %. Awake, alert, oriented Speech fluent, appropriate CN grossly intact 5/5 BUE/BLE Wound c/d/i  Disposition: Discharge disposition: 01-Home or Self Care       Discharge Instructions    Call MD for:  difficulty breathing, headache or visual disturbances   Complete by: As directed    Call MD for:  persistant dizziness or light-headedness   Complete by: As directed    Call MD for:  redness, tenderness, or signs of infection (pain, swelling, redness, odor or green/yellow discharge around incision site)   Complete by: As directed    Call MD for:  severe uncontrolled pain   Complete by: As directed    Call MD for:  temperature >100.4   Complete by: As directed    Diet general   Complete by: As directed    Driving Restrictions   Complete by: As directed    Do not drive until given clearance.   Increase activity slowly   Complete by: As directed    Lifting restrictions   Complete by: As directed    Do not lift anything >10lbs. Avoid bending and twisting in awkward positions. Avoid bending at the back.   May shower / Bathe   Complete by: As directed    In 24 hours. Okay to wash wound with warm soapy water. Avoid  scrubbing the wound. Pat dry.   Remove dressing in 24 hours   Complete by: As directed      Allergies as of 02/11/2019   No Known Allergies     Medication List    STOP taking these medications   oxyCODONE-acetaminophen 5-325 MG tablet Commonly known as: PERCOCET/ROXICET Replaced by: oxyCODONE-acetaminophen 7.5-325 MG tablet     TAKE these medications   methocarbamol 750 MG tablet Commonly known as: Robaxin-750 Take 1 tablet (750 mg total) by mouth 3 (three) times daily as needed for muscle spasms.   oxyCODONE-acetaminophen 7.5-325 MG tablet Commonly known as: Percocet Take 1 tablet by mouth every 4 (four) hours as needed. Replaces: oxyCODONE-acetaminophen 5-325 MG tablet      Follow-up Information    Lisbeth Renshaw, MD. Schedule an appointment as soon as possible for a visit in 3 week(s).   Specialty: Neurosurgery Contact information: [address] [place] [address] [phone]           Signed: Alyson Ingles 02/11/2019, 7:20 AM

## 2019-02-11 NOTE — Evaluation (Signed)
Occupational Therapy Evaluation Patient Details Name: Gerald Carpenter MRN: [id number] DOB: Apr 10, 1950 Today's Date: 02/11/2019    History of Present Illness Pt is a 67 year old man admitted for L4-5 PLIF.   Clinical Impression   All education completed with pt verbalizing and/or demonstrating understanding. No further OT needs.    Follow Up Recommendations  No OT follow up    Equipment Recommendations  None recommended by OT    Recommendations for Other Services       Precautions / Restrictions Precautions Precautions: Back Precaution Comments: reviewed back precautions related to ADL and IADL Required Braces or Orthoses: Spinal Brace Spinal Brace: Lumbar corset;Applied in sitting position Restrictions Weight Bearing Restrictions: No      Mobility Bed Mobility               General bed mobility comments: pt received in chair  Transfers Overall transfer level: Modified independent Equipment used: None             General transfer comment: slow to rise, no assist    Balance                                           ADL either performed or assessed with clinical judgement   ADL Overall ADL's : At baseline                                       General ADL Comments: pt is not interested in AE for LB ADL, will continue to rely on his wife, reviewed back precautions in detail     Vision Patient Visual Report: No change from baseline       Perception     Praxis      Pertinent Vitals/Pain Pain Assessment: Faces Faces Pain Scale: Hurts a little bit Pain Location: Incision site Pain Descriptors / Indicators: Discomfort Pain Intervention(s): Monitored during session;Premedicated before session     Hand Dominance Right   Extremity/Trunk Assessment Upper Extremity Assessment Upper Extremity Assessment: Overall WFL for tasks assessed   Lower Extremity Assessment Lower Extremity Assessment: Defer to PT  evaluation   Cervical / Trunk Assessment Cervical / Trunk Assessment: Other exceptions Cervical / Trunk Exceptions: s/p spinal sx   Communication Communication Communication: No difficulties   Cognition Arousal/Alertness: Awake/alert Behavior During Therapy: WFL for tasks assessed/performed Overall Cognitive Status: Within Functional Limits for tasks assessed                                     General Comments       Exercises     Shoulder Instructions      Home Living Family/patient expects to be discharged to:: Private residence Living Arrangements: Spouse/significant other Available Help at Discharge: Family;Available 24 hours/day Type of Home: House Home Access: Stairs to enter [employer] of Steps: 2   Home Layout: One level               Home Equipment: None          Prior Functioning/Environment Level of Independence: Needs assistance    ADL's / Homemaking Assistance Needed: wife has assisted with LB dressing and does all housekeeping and meal prep  OT Problem List:        OT Treatment/Interventions:      OT Goals(Current goals can be found in the care plan section) Acute Rehab OT Goals Patient Stated Goal: return home  OT Frequency:     Barriers to D/C:            Co-evaluation              AM-PAC OT "6 Clicks" Daily Activity     Outcome Measure Help from another person eating meals?: None Help from another person taking care of personal grooming?: None Help from another person toileting, which includes using toliet, bedpan, or urinal?: None Help from another person bathing (including washing, rinsing, drying)?: A Little Help from another person to put on and taking off regular upper body clothing?: None Help from another person to put on and taking off regular lower body clothing?: A Little 6 Click Score: 22   End of Session Equipment Utilized During Treatment: Back brace  Activity  Tolerance: Patient tolerated treatment well Patient left: in chair;with call bell/phone within reach  OT Visit Diagnosis: Pain                Time: [id number] OT Time Calculation (min): 16 min Charges:  OT General Charges $OT Visit: 1 Visit OT Evaluation $OT Eval Low Complexity: 1 Low  Nestor Lewandowsky, OTR/L Acute Rehabilitation Services Pager: [phone] Office: [phone]  Malka So 02/11/2019, 9:49 AM

## 2019-02-11 NOTE — Care Management CC44 (Signed)
Condition Code 44 Documentation Completed  Patient Details  Name: Gerald Carpenter MRN: [id number] Date of Birth: [birth year]   Condition Code 44 given:  Yes Patient signature on Condition Code 44 notice:  Yes Documentation of 2 MD's agreement:  Yes Code 44 added to claim:  Yes    Benard Halsted, LCSW 02/11/2019, 8:44 AM

## 2019-02-11 NOTE — Progress Notes (Signed)
  NEUROSURGERY PROGRESS NOTE   No issues overnight.  Complains of appropriate back soreness Pre op symptoms resolved ambulating well. Voiding normal. Tolerating po  EXAM:  BP (!) 100/54 (BP Location: Right Arm)   Pulse 71   Temp 98 F (36.7 C) (Oral)   Resp 18   Ht 5\' 8"  (1.727 m)   Wt 68 kg   SpO2 94%   BMI 22.79 kg/m   Awake, alert, oriented  Speech fluent, appropriate  CN grossly intact  5/5 BUE/BLE  incision c/d/i  IMPRESSION/PLAN 68 y.o. male POD L4-5 PLIF. Doing wel - Discharge home

## 2019-02-11 NOTE — Care Management Obs Status (Signed)
[place] NOTIFICATION   Patient Details  Name: Gerald Carpenter MRN: [id number] Date of Birth: November 03, 1950   Medicare Observation Status Notification Given:  Yes    Benard Halsted, LCSW 02/11/2019, 8:44 AM

## 2019-02-11 NOTE — Discharge Instructions (Signed)
Spinal Fusion, Adult, Care After This sheet gives you information about how to care for yourself after your procedure. Your doctor may also give you more specific instructions. If you have problems or questions, contact your doctor. Follow these instructions at home: Medicines  Take over-the-counter and prescription medicines only as told by your doctor. These include any medicines for pain or blood-thinning medicines (anticoagulants).  If you were prescribed an antibiotic medicine, take it as told by your doctor. Do not stop taking the antibiotic even if you start to feel better.  Do not drive for 24 hours if you were given a medicine to help you relax (sedative) during your procedure.  Do not drive or use heavy machinery while taking prescription pain medicine. If you have a brace:  Wear the brace as told by your doctor. Take it off only as told by your doctor.  Keep the brace clean. Managing pain, stiffness, and swelling  If directed, put ice on the surgery area: ? If you have a removable brace, take it off as told by your doctor. ? Put ice in a plastic bag. ? Place a towel between your skin and the bag. ? Leave the ice on for 20 minutes, 2-3 times a day. Surgery cut care   Follow instructions from your doctor about how to take care of your cut from surgery (incision). Make sure you: ? Wash your hands with soap and water before you change your bandage (dressing). If you cannot use soap and water, use hand sanitizer. ? Change your bandage as told by your doctor. ? Leave stitches (sutures), skin glue, or skin tape (adhesive) strips in place. They may need to stay in place for 2 weeks or longer. If tape strips get loose and curl up, you may trim the loose edges. Do not remove tape strips completely unless your doctor says it is okay.  Keep your cut from surgery clean and dry. ? Do not take baths, swim, or use a hot tub until your doctor says it is okay. ? Ask your doctor if you can  take showers. You may only be allowed to take sponge baths.  Every day, check your cut from surgery and the area around it for: ? More redness, swelling, or pain. ? Fluid or blood. ? Warmth. ? Pus or a bad smell.  If you have a drain tube, follow instructions from your doctor about caring for it. Do not take out the drain tube or any bandages unless your doctor says it is okay. Physical activity  Rest and protect your back as much as possible.  Follow instructions from your doctor about how to move. Use good posture to help your spine heal.  Do not lift anything that is heavier than 8 lb (3.6 kg), or the limit that you are told, until your doctor says that it is safe.  Do not twist or bend at the waist until your doctor says it is okay.  It is best if you: ? Do not make pushing and pulling motions. ? Do not sit or lie down in the same position for a long time. ? Do not raise your hands or arms above your head.  Return to your normal activities as told by your doctor. Ask your doctor what activities are safe for you. Rest and protect your back as much as you can.  Do not start to exercise until your doctor says it is okay. Ask your doctor what kinds of exercise you can do  to make your back stronger. General instructions  To prevent blood clots and lessen swelling in your legs: ? Wear compression stockings as told. ? Walk one or more times every few hours as told by your doctor.  Do not use any products that contain nicotine or tobacco, such as cigarettes and e-cigarettes. These can delay bone healing. If you need help quitting, ask your doctor.  To prevent or treat constipation while you are taking prescription pain medicine, your doctor may suggest that you: ? Drink enough fluid to keep your pee (urine) pale yellow. ? Take over-the-counter or prescription medicines. ? Eat foods that are high in fiber. These include fresh fruits and vegetables, whole grains, and beans. ? Limit  foods that are high in fat and processed sugars, such as fried and sweet foods.  Keep all follow-up visits as told by your doctor. This is important. Contact a doctor if:  Your pain gets worse.  Your medicine does not help your pain.  Your legs or feet get painful or swollen.  Your cut from surgery is more red, swollen, or painful.  Your cut from surgery feels warm to the touch.  You have: ? Fluid or blood coming from your cut from surgery. ? Pus or a bad smell coming from your cut from surgery. ? A fever. ? Weakness or loss of feeling (numbness) in your legs that is new or getting worse. ? Trouble controlling when you pee (urinate) or poop (have a bowel movement).  You feel sick to your stomach (nauseous).  You throw up (vomit). Get help right away if:  Your pain is very bad.  You have chest pain.  You have trouble breathing.  You start to have a cough. These symptoms may be an emergency. Do not wait to see if the symptoms will go away. Get medical help right away. Call your local emergency services (911 in the [place] Do not drive yourself to the hospital. Summary  After the procedure, it is common to have pain in your back and pain by your surgery cut(s).  Icing and pain medicines may help to control the pain. Follow directions from your doctor.  Rest and protect your back as much as possible. Do not twist or bend at the waist.  Get up and walk one or more times every few hours as told by your doctor. This information is not intended to replace advice given to you by your health care provider. Make sure you discuss any questions you have with your health care provider. Document Released: 06/01/2010 Document Revised: 05/29/2018 Document Reviewed: 05/22/2016 Elsevier Patient Education  [employer].

## 2019-02-11 NOTE — Progress Notes (Signed)
Pt doing well. Pt given discharge instructions with Rx's, verbal understanding was provided. Pt's incision is clean and dry with no sign of infection. Pt's IV was removed prior to discharge. Pt discharged home via wheelchair per MD order. Pt is stable @ discharge and has no other needs at this time. Holli Humbles, RN

## 2019-02-11 NOTE — Evaluation (Signed)
Physical Therapy Evaluation Patient Details Name: Gerald Carpenter MRN: [id number] DOB: [birth year] Today's Date: 02/11/2019   History of Present Illness  Pt is a 68 year old man admitted for L4-5 PLIF.  Clinical Impression  Patient evaluated by Physical Therapy with no further acute PT needs identified. All education has been completed and the patient has no further questions. Pt was able to demonstrate transfers and ambulation with gross modified independence and no AD. Pt was educated on precautions, brace application/wearing schedule, appropriate activity progression, and car transfer. See below for any follow-up Physical Therapy or equipment needs. PT is signing off. Thank you for this referral.     Follow Up Recommendations No PT follow up;Supervision - Intermittent    Equipment Recommendations  None recommended by PT    Recommendations for Other Services       Precautions / Restrictions Precautions Precautions: Back Precaution Booklet Issued: Yes (comment) Precaution Comments: Reviewed handout and pt was cued for precautions during functional mobility.  Required Braces or Orthoses: Spinal Brace Spinal Brace: Lumbar corset;Applied in sitting position Restrictions Weight Bearing Restrictions: No      Mobility  Bed Mobility Overal bed mobility: Modified Independent Bed Mobility: Rolling;Sidelying to Sit           General bed mobility comments: No assist. Good log roll technique with HOB flat and rails lowered to simulate home environment.  Transfers Overall transfer level: Modified independent Equipment used: None Transfers: Sit to/from Stand Sit to Stand: Supervision         General transfer comment: slow to rise, no assist  Ambulation/Gait Ambulation/Gait assistance: Modified independent (Device/Increase time)   Assistive device: None Gait Pattern/deviations: Step-through pattern;Decreased stride length;Trunk flexed Gait velocity: Decreased Gait velocity  interpretation: <1.8 ft/sec, indicate of risk for recurrent falls General Gait Details: Antalgic due to pain but overall improved from prior session (pre-operatively).  Stairs Stairs: (Declines stair training)          Wheelchair Mobility    Modified Rankin (Stroke Patients Only)       Balance Overall balance assessment: Needs assistance Sitting-balance support: Feet supported;No upper extremity supported Sitting balance-Leahy Scale: Good     Standing balance support: During functional activity Standing balance-Leahy Scale: Fair                               Pertinent Vitals/Pain Pain Assessment: Faces Faces Pain Scale: Hurts a little bit Pain Location: Incision site Pain Descriptors / Indicators: Discomfort Pain Intervention(s): Limited activity within patient's tolerance;Monitored during session;Repositioned    Home Living Family/patient expects to be discharged to:: Private residence Living Arrangements: Spouse/significant other Available Help at Discharge: Family;Available 24 hours/day Type of Home: House Home Access: Stairs to enter   [employer] of Steps: 2 Home Layout: One level Home Equipment: None      Prior Function Level of Independence: Needs assistance      ADL's / Homemaking Assistance Needed: wife has assisted with LB dressing and does all housekeeping and meal prep  Comments: Antalgic, flexed gait due to pain but independent     Hand Dominance   Dominant Hand: Right    Extremity/Trunk Assessment   Upper Extremity Assessment Upper Extremity Assessment: Defer to OT evaluation    Lower Extremity Assessment Lower Extremity Assessment: Generalized weakness(Consistent with pre-op diagnosis)    Cervical / Trunk Assessment Cervical / Trunk Assessment: Other exceptions Cervical / Trunk Exceptions: s/p spinal sx  Communication  Communication: No difficulties  Cognition Arousal/Alertness: Awake/alert Behavior  During Therapy: WFL for tasks assessed/performed Overall Cognitive Status: Within Functional Limits for tasks assessed                                        General Comments      Exercises     Assessment/Plan    PT Assessment Patent does not need any further PT services  PT Problem List Decreased strength;Decreased activity tolerance;Decreased balance;Decreased mobility;Decreased safety awareness;Decreased knowledge of precautions;Pain       PT Treatment Interventions DME instruction;Gait training;Functional mobility training;Therapeutic activities;Therapeutic exercise;Neuromuscular re-education;Patient/family education    PT Goals (Current goals can be found in the Care Plan section)  Acute Rehab PT Goals Patient Stated Goal: return home PT Goal Formulation: All assessment and education complete, DC therapy    Frequency     Barriers to discharge        Co-evaluation               AM-PAC PT "6 Clicks" Mobility  Outcome Measure Help needed turning from your back to your side while in a flat bed without using bedrails?: None Help needed moving from lying on your back to sitting on the side of a flat bed without using bedrails?: None Help needed moving to and from a bed to a chair (including a wheelchair)?: None Help needed standing up from a chair using your arms (e.g., wheelchair or bedside chair)?: A Little Help needed to walk in hospital room?: A Little Help needed climbing 3-5 steps with a railing? : A Little 6 Click Score: 21    End of Session Equipment Utilized During Treatment: Back brace Activity Tolerance: Patient tolerated treatment well Patient left: in chair;with call bell/phone within reach Nurse Communication: Mobility status PT Visit Diagnosis: Pain;Other symptoms and signs involving the nervous system (R29.898) Pain - part of body: (back)    Time: [id number] PT Time Calculation (min) (ACUTE ONLY): 16 min   Charges:   PT  Evaluation $PT Eval Low Complexity: 1 Low          Rolinda Roan, PT, DPT Acute Rehabilitation Services Pager: [phone] Office: [phone]   Thelma Comp 02/11/2019, 3:05 PM

## 2019-02-16 ENCOUNTER — Encounter: Payer: Self-pay | Admitting: *Deleted

## 2019-02-24 ENCOUNTER — Encounter: Payer: Self-pay | Admitting: *Deleted

## 2019-03-09 ENCOUNTER — Encounter: Payer: Self-pay | Admitting: Physical Therapy

## 2019-03-09 ENCOUNTER — Other Ambulatory Visit: Payer: Self-pay

## 2019-03-09 ENCOUNTER — Ambulatory Visit: Payer: Medicare Other | Attending: Neurosurgery | Admitting: Physical Therapy

## 2019-03-09 DIAGNOSIS — M62830 Muscle spasm of back: Secondary | ICD-10-CM | POA: Diagnosis present

## 2019-03-09 DIAGNOSIS — M6281 Muscle weakness (generalized): Secondary | ICD-10-CM | POA: Diagnosis present

## 2019-03-09 DIAGNOSIS — R262 Difficulty in walking, not elsewhere classified: Secondary | ICD-10-CM | POA: Insufficient documentation

## 2019-03-09 DIAGNOSIS — M5441 Lumbago with sciatica, right side: Secondary | ICD-10-CM | POA: Insufficient documentation

## 2019-03-09 NOTE — Patient Instructions (Signed)
Access Code: [email or web address]  URL: [email or web address]  Date: 03/09/2019  Prepared by: Lum Babe   Exercises Hooklying Single Knee to Chest - 10 reps - 1 sets - 5 hold - 3x daily - 7x weekly Seated Hamstring Stretch with Chair - 10 reps - 1 sets - 10 hold - 3x daily - 7x weekly

## 2019-03-09 NOTE — Therapy (Signed)
[place], [place], [address] Phone: [phone]   Fax:  [phone]  Physical Therapy Evaluation  Patient Details  Name: Gerald Carpenter MRN: [id number] Date of Birth: March 16, 1950 Referring Provider (PT): Kathyrn Sheriff   Encounter Date: 03/09/2019  PT End of Session - 03/09/19 0832    Visit Number  1    Date for PT Re-Evaluation  05/07/19    PT Start Time  0748    PT Stop Time  0835    PT Time Calculation (min)  47 min    Equipment Utilized During Treatment  Back brace    Activity Tolerance  Patient tolerated treatment well    Behavior During Therapy  [facility] for tasks assessed/performed       History reviewed. No pertinent past medical history.  Past Surgical History:  Procedure Laterality Date  . ANTERIOR LAT LUMBAR FUSION Right 02/05/2019   Procedure: ATTEMPTED ANTERIOR LATERAL INTERBODY FUSION;  Surgeon: Consuella Lose, MD;  Location: [place];  Service: Neurosurgery;  Laterality: Right;  RIGHT LATERAL TRANSPSOAS DISCECTOMY AND FUSION WITH ROBOTIC ASSISTED PEDICLE SCREW STABILIZATION, LUMBAR 4- LUMBAR 5  . COLONOSCOPY    . EYE SURGERY Bilateral 2013   lens implants for cataracts after Lasik  . KNEE ARTHROSCOPY Right 1980  . TONSILLECTOMY     at a very young age, but grew back    There were no vitals filed for this visit.   Subjective Assessment - 03/09/19 0751    Subjective  Patient underwent a lumbar fusion on 02/05/19.  He reports having LBP with right leg pain and mm atrophy for about 4 years prior.  He reports that he was going to try to do exercises on his own but reports he just could not do it, pain and weakness, and fear was his factor to come to therapy.    Limitations  Standing;Walking;House hold activities    How long can you stand comfortably?  5-10 minutes    Patient Stated Goals  have no pain, be stronger    Currently in Pain?  Yes    Pain Score  3     Pain Location  Back     Pain Orientation  Lower    Pain Descriptors / Indicators  Aching;Sore    Pain Type  Acute pain;Surgical pain    Pain Radiating Towards  some pain right hip and right thigh front and back    Pain Onset  More than a month ago    Pain Frequency  Constant    Aggravating Factors   worse in the AM, difficulty sleeping, standing and walking pain up to 6/10    Pain Relieving Factors  rest, mm relaxer and pain med pain can be down to 1-2/10    Effect of Pain on Daily Activities  difficulty sleeping, difficulty standing and walking, difficulty with ADL's         [facility] PT Assessment - 03/09/19 0001      Assessment   Medical Diagnosis  s/p lumbar fusion    Referring Provider (PT)  Kathyrn Sheriff    Onset Date/Surgical Date  02/05/19    Prior Therapy  no      Precautions   Precautions  Back    Precaution Comments  no bending, lifting and twisting      Balance Screen   Has the patient fallen in the past 6 months  No    Has the patient had a decrease in activity  level because of a fear of falling?   No    Is the patient reluctant to leave their home because of a fear of falling?   No      Home Environment   Additional Comments  has stairs, does housework and yardwork      Prior Function   Level of Independence  Independent    Vocation  Retired    Leisure  no exercise      Posture/Postural Control   Posture Comments  slouched posture, forward head, in a back brace      ROM / Strength   AROM / PROM / Strength  AROM;Strength      AROM   Overall AROM Comments  lumbar ROM is very limited, in brace, no bending per MD, has slight motions but all motions increased pain in the irhgt hip area      Strength   Strength Assessment Site  Hip;Knee;Ankle    Right/Left Hip  Right    Right Hip Flexion  3-/5    Right Hip Extension  3-/5    Right Hip ABduction  3-/5    Right/Left Knee  Right    Right Knee Flexion  3+/5    Right Knee Extension  3+/5    Right/Left Ankle  Right    Right Ankle  Dorsiflexion  4-/5    Right Ankle Plantar Flexion  4-/5    Right Ankle Inversion  4-/5    Right Ankle Eversion  4-/5      Flexibility   Soft Tissue Assessment /Muscle Length  yes    Hamstrings  very tight 40 degrees SLR    Quadriceps  very tight    Piriformis  the right hip is really tight and painful with flexion and any rotation      Palpation   Palpation comment  popping in the right       Ambulation/Gait   Gait Comments  no device, slow gait, limp on the right, can do stairs step over step but going up is very difficult with the right                Objective measurements completed on examination: See above findings.      [facility] Adult PT Treatment/Exercise - 03/09/19 0001      Exercises   Exercises  Lumbar      Lumbar Exercises: Aerobic   Nustep  level 3 x 5 minutes               PT Short Term Goals - 03/09/19 0837      PT SHORT TERM GOAL #1   Title  independent with initial HEP    Time  2    Period  Weeks    Status  New        PT Long Term Goals - 03/09/19 [id number]      PT LONG TERM GOAL #1   Title  increase lumbar ROM 25%    Time  8    Period  Weeks    Status  New      PT LONG TERM GOAL #2   Title  understand posture and body mechanics    Time  8    Period  Weeks    Status  New      PT LONG TERM GOAL #3   Title  increase the right hip strength to 4/5    Time  8    Period  Weeks    Status  New      PT LONG TERM GOAL #4   Title  go up and down stairs without difficulty    Time  8    Period  Weeks    Status  New      PT LONG TERM GOAL #5   Title  incresare SLR on the right to 70 degrees    Time  8    Period  Weeks    Status  New             Plan - 03/09/19 [id number]    Clinical Impression Statement  Patient reports having LBP and right radiculopathy for about 4 years, he reports that he underwent a lumbar fusion L5 on 02/05/19.  He is having a lot of weakness in the right leg but reports that this is over the past 4 years,  he is very tight in the right hip and pain with right hip motions, he limps on the right and can go up and down the stairs step over step but going up is very difficult    Personal Factors and Comorbidities  Comorbidity 2    Comorbidities  Lumbar surgery, LBP, knee surgery    Examination-Activity Limitations  Sleep;Transfers;Stand;Locomotion Level;Bend;Bed Mobility;Lift;Stairs;Squat    Stability/Clinical Decision Making  Evolving/Moderate complexity    Clinical Decision Making  Moderate    Rehab Potential  Good    PT Frequency  3x / week    PT Duration  8 weeks    PT Treatment/Interventions  ADLs/Self Care Home Management;Electrical Stimulation;Moist Heat;Therapeutic activities;Therapeutic exercise;Neuromuscular re-education;Balance training;Manual techniques;Patient/family education    PT Next Visit Plan  slowly progress, no bending or twisting    Consulted and Agree with Plan of Care  Patient       Patient will benefit from skilled therapeutic intervention in order to improve the following deficits and impairments:  Abnormal gait, Improper body mechanics, Pain, Postural dysfunction, Increased muscle spasms, Decreased mobility, Decreased activity tolerance, Decreased endurance, Decreased range of motion, Decreased strength, Impaired flexibility, Difficulty walking, Decreased balance  Visit Diagnosis: Acute right-sided low back pain with right-sided sciatica - Plan: PT plan of care cert/re-cert  Muscle weakness (generalized) - Plan: PT plan of care cert/re-cert  Muscle spasm of back - Plan: PT plan of care cert/re-cert  Difficulty in walking, not elsewhere classified - Plan: PT plan of care cert/re-cert     Problem List Patient Active Problem List   Diagnosis Date Noted  . Lumbar radiculopathy 02/05/2019    Sumner Boast., PT 03/09/2019, 8:41 AM  [address] [place], [place], [address] Phone:  [phone]   Fax:  [phone]  Name: Gerald Carpenter MRN: [id number] Date of Birth: [birth year]

## 2019-03-11 ENCOUNTER — Encounter: Payer: Self-pay | Admitting: Physical Therapy

## 2019-03-11 ENCOUNTER — Other Ambulatory Visit: Payer: Self-pay

## 2019-03-11 ENCOUNTER — Ambulatory Visit: Payer: Medicare Other | Admitting: Physical Therapy

## 2019-03-11 DIAGNOSIS — M5441 Lumbago with sciatica, right side: Secondary | ICD-10-CM | POA: Diagnosis not present

## 2019-03-11 DIAGNOSIS — R262 Difficulty in walking, not elsewhere classified: Secondary | ICD-10-CM

## 2019-03-11 DIAGNOSIS — M6281 Muscle weakness (generalized): Secondary | ICD-10-CM

## 2019-03-11 DIAGNOSIS — M62830 Muscle spasm of back: Secondary | ICD-10-CM

## 2019-03-11 NOTE — Therapy (Signed)
[place], [place], [address] Phone: [phone]   Fax:  [phone]  Physical Therapy Treatment  Patient Details  Name: Arin Tapscott MRN: [id number] Date of Birth: September 09, 1950 Referring Provider (PT): Kathyrn Sheriff   Encounter Date: 03/11/2019  PT End of Session - 03/11/19 0831    Visit Number  2    Date for PT Re-Evaluation  05/07/19    PT Start Time  0752    PT Stop Time  0835    PT Time Calculation (min)  43 min    Activity Tolerance  Patient tolerated treatment well    Behavior During Therapy  [facility] for tasks assessed/performed       History reviewed. No pertinent past medical history.  Past Surgical History:  Procedure Laterality Date  . ANTERIOR LAT LUMBAR FUSION Right 02/05/2019   Procedure: ATTEMPTED ANTERIOR LATERAL INTERBODY FUSION;  Surgeon: Consuella Lose, MD;  Location: [place];  Service: Neurosurgery;  Laterality: Right;  RIGHT LATERAL TRANSPSOAS DISCECTOMY AND FUSION WITH ROBOTIC ASSISTED PEDICLE SCREW STABILIZATION, LUMBAR 4- LUMBAR 5  . COLONOSCOPY    . EYE SURGERY Bilateral 2013   lens implants for cataracts after Lasik  . KNEE ARTHROSCOPY Right 1980  . TONSILLECTOMY     at a very young age, but grew back    There were no vitals filed for this visit.  Subjective Assessment - 03/11/19 0755    Subjective  Patient reports that his right leg has really been sore, no real back pain    Currently in Pain?  Yes    Pain Score  7     Pain Location  Hip    Pain Orientation  Right    Pain Descriptors / Indicators  Aching;Sore                       [facility] Adult PT Treatment/Exercise - 03/11/19 0001      Lumbar Exercises: Aerobic   Nustep  level 3 x 6 minutes      Lumbar Exercises: Seated   Long Arc Quad on Chair  Both;2 sets;10 reps    Other Seated Lumbar Exercises  red tband rows and extensions 2x10 each    Other Seated Lumbar Exercises  marches no weight  3x5 reps. toe taps and heel raises      Lumbar Exercises: Supine   Other Supine Lumbar Exercises  SAQ 2x10 each, feet on ball K2c, very small side to side, isometric abs    Other Supine Lumbar Exercises  hooklying ball b/n knees squeeze      Modalities   Modalities  Moist Heat      Moist Heat Therapy   Number Minutes Moist Heat  10 Minutes    Moist Heat Location  Hip               PT Short Term Goals - 03/09/19 [id number]      PT SHORT TERM GOAL #1   Title  independent with initial HEP    Time  2    Period  Weeks    Status  New        PT Long Term Goals - 03/09/19 [id number]      PT LONG TERM GOAL #1   Title  increase lumbar ROM 25%    Time  8    Period  Weeks    Status  New      PT LONG TERM GOAL #2  Title  understand posture and body mechanics    Time  8    Period  Weeks    Status  New      PT LONG TERM GOAL #3   Title  increase the right hip strength to 4/5    Time  8    Period  Weeks    Status  New      PT LONG TERM GOAL #4   Title  go up and down stairs without difficulty    Time  8    Period  Weeks    Status  New      PT LONG TERM GOAL #5   Title  incresare SLR on the right to 70 degrees    Time  8    Period  Weeks    Status  New            Plan - 03/11/19 [id number]    Clinical Impression Statement  Patient has a right LE that is very weak, has a lot of popping in the hip and has some pain with any righ tLE movements, has to go slow and at times needs help to move the right LE, got good core activation    PT Next Visit Plan  slowly progress, no bending or twisting    Consulted and Agree with Plan of Care  Patient       Patient will benefit from skilled therapeutic intervention in order to improve the following deficits and impairments:  Abnormal gait, Improper body mechanics, Pain, Postural dysfunction, Increased muscle spasms, Decreased mobility, Decreased activity tolerance, Decreased endurance, Decreased range of motion, Decreased strength,  Impaired flexibility, Difficulty walking, Decreased balance  Visit Diagnosis: Acute right-sided low back pain with right-sided sciatica  Muscle weakness (generalized)  Muscle spasm of back  Difficulty in walking, not elsewhere classified     Problem List Patient Active Problem List   Diagnosis Date Noted  . Lumbar radiculopathy 02/05/2019    Sumner Boast., PT 03/11/2019, 8:37 AM  [address] [place], [place], [address] Phone: [phone]   Fax:  [phone]  Name: Delvon Ambroise MRN: [id number] Date of Birth: February 21, 1950

## 2019-03-13 ENCOUNTER — Encounter: Payer: Self-pay | Admitting: Physical Therapy

## 2019-03-13 ENCOUNTER — Other Ambulatory Visit: Payer: Self-pay

## 2019-03-13 ENCOUNTER — Ambulatory Visit: Payer: Medicare Other | Admitting: Physical Therapy

## 2019-03-13 DIAGNOSIS — M5441 Lumbago with sciatica, right side: Secondary | ICD-10-CM | POA: Diagnosis not present

## 2019-03-13 DIAGNOSIS — R262 Difficulty in walking, not elsewhere classified: Secondary | ICD-10-CM

## 2019-03-13 DIAGNOSIS — M6281 Muscle weakness (generalized): Secondary | ICD-10-CM

## 2019-03-13 DIAGNOSIS — M62830 Muscle spasm of back: Secondary | ICD-10-CM

## 2019-03-13 NOTE — Therapy (Signed)
[place], [place], [address] Phone: [phone]   Fax:  [phone]  Physical Therapy Treatment  Patient Details  Name: Gerald Carpenter MRN: [id number] Date of Birth: [birth year] Referring Provider (PT): Kathyrn Sheriff   Encounter Date: 03/13/2019  PT End of Session - 03/13/19 0924    Visit Number  3    Date for PT Re-Evaluation  05/07/19    PT Start Time  0840    PT Stop Time  0932    PT Time Calculation (min)  52 min    Equipment Utilized During Treatment  Back brace    Activity Tolerance  Patient tolerated treatment well    Behavior During Therapy  [facility] for tasks assessed/performed       History reviewed. No pertinent past medical history.  Past Surgical History:  Procedure Laterality Date  . ANTERIOR LAT LUMBAR FUSION Right 02/05/2019   Procedure: ATTEMPTED ANTERIOR LATERAL INTERBODY FUSION;  Surgeon: Consuella Lose, MD;  Location: [place];  Service: Neurosurgery;  Laterality: Right;  RIGHT LATERAL TRANSPSOAS DISCECTOMY AND FUSION WITH ROBOTIC ASSISTED PEDICLE SCREW STABILIZATION, LUMBAR 4- LUMBAR 5  . COLONOSCOPY    . EYE SURGERY Bilateral 2013   lens implants for cataracts after Lasik  . KNEE ARTHROSCOPY Right 1980  . TONSILLECTOMY     at a very young age, but grew back    There were no vitals filed for this visit.  Subjective Assessment - 03/13/19 0846    Subjective  "Sore"    Currently in Pain?  Yes    Pain Score  7     Pain Location  Leg    Pain Orientation  Anterior;Proximal                       [facility] Adult PT Treatment/Exercise - 03/13/19 0001      Lumbar Exercises: Aerobic   Nustep  level 2 x 7 minutes      Lumbar Exercises: Standing   Shoulder Extension  Theraband;Both;15 reps;Power English as a second language teacher Level (Shoulder Extension)  Level 2 (Red)      Lumbar Exercises: Seated   Long Arc Quad on Chair  Both;2 sets;10 reps    Sit to Stand  5 reps    x3 airex on mat table    Other Seated Lumbar Exercises  HS curls red 2x15, rows red 2x10,     Other Seated Lumbar Exercises  marches no weight 3x5 reps       Modalities   Modalities  Moist Heat      Moist Heat Therapy   Number Minutes Moist Heat  10 Minutes    Moist Heat Location  Hip               PT Short Term Goals - 03/09/19 [id number]      PT SHORT TERM GOAL #1   Title  independent with initial HEP    Time  2    Period  Weeks    Status  New        PT Long Term Goals - 03/09/19 [id number]      PT LONG TERM GOAL #1   Title  increase lumbar ROM 25%    Time  8    Period  Weeks    Status  New      PT LONG TERM GOAL #2   Title  understand posture and body mechanics  Time  8    Period  Weeks    Status  New      PT LONG TERM GOAL #3   Title  increase the right hip strength to 4/5    Time  8    Period  Weeks    Status  New      PT LONG TERM GOAL #4   Title  go up and down stairs without difficulty    Time  8    Period  Weeks    Status  New      PT LONG TERM GOAL #5   Title  incresare SLR on the right to 70 degrees    Time  8    Period  Weeks    Status  New            Plan - 03/13/19 [id number]    Clinical Impression Statement  Pt did well with a slight progression to TE. Performed shoulder ext in standing with an emphasis on posture. Reports a good pull with seated HS curls. Cues for core engagement needed with sated rows. RLE is very weak noted with LAQ and marching.    Examination-Activity Limitations  Sleep;Transfers;Stand;Locomotion Level;Bend;Bed Mobility;Lift;Stairs;Squat    Stability/Clinical Decision Making  Evolving/Moderate complexity    Rehab Potential  Good    PT Frequency  3x / week    PT Duration  8 weeks    PT Treatment/Interventions  ADLs/Self Care Home Management;Electrical Stimulation;Moist Heat;Therapeutic activities;Therapeutic exercise;Neuromuscular re-education;Balance training;Manual techniques;Patient/family education    PT Next  Visit Plan  slowly progress, no bending or twisting       Patient will benefit from skilled therapeutic intervention in order to improve the following deficits and impairments:  Abnormal gait, Improper body mechanics, Pain, Postural dysfunction, Increased muscle spasms, Decreased mobility, Decreased activity tolerance, Decreased endurance, Decreased range of motion, Decreased strength, Impaired flexibility, Difficulty walking, Decreased balance  Visit Diagnosis: Acute right-sided low back pain with right-sided sciatica  Muscle weakness (generalized)  Muscle spasm of back  Difficulty in walking, not elsewhere classified     Problem List Patient Active Problem List   Diagnosis Date Noted  . Lumbar radiculopathy 02/05/2019    [name], PTA 03/13/2019, 9:26 AM  [address] [place], [place], [address] Phone: [phone]   Fax:  [phone]  Name: Gerald Carpenter MRN: [id number] Date of Birth: 11-29-1950

## 2019-03-16 ENCOUNTER — Ambulatory Visit: Payer: Medicare Other | Admitting: Physical Therapy

## 2019-03-16 ENCOUNTER — Other Ambulatory Visit: Payer: Self-pay

## 2019-03-16 ENCOUNTER — Encounter: Payer: Self-pay | Admitting: Physical Therapy

## 2019-03-16 DIAGNOSIS — M5441 Lumbago with sciatica, right side: Secondary | ICD-10-CM

## 2019-03-16 DIAGNOSIS — M6281 Muscle weakness (generalized): Secondary | ICD-10-CM

## 2019-03-16 DIAGNOSIS — M62830 Muscle spasm of back: Secondary | ICD-10-CM

## 2019-03-16 DIAGNOSIS — R262 Difficulty in walking, not elsewhere classified: Secondary | ICD-10-CM

## 2019-03-16 NOTE — Therapy (Signed)
[place], [place], [address] Phone: [phone]   Fax:  [phone]  Physical Therapy Treatment  Patient Details  Name: Gerald Carpenter MRN: [id number] Date of Birth: 12-Apr-1950 Referring Provider (PT): Kathyrn Sheriff   Encounter Date: 03/16/2019  PT End of Session - 03/16/19 1105    Visit Number  4    Date for PT Re-Evaluation  05/07/19    PT Start Time  [id number]    PT Stop Time  1100    PT Time Calculation (min)  45 min    Equipment Utilized During Treatment  Back brace    Activity Tolerance  Patient tolerated treatment well    Behavior During Therapy  [facility] for tasks assessed/performed       History reviewed. No pertinent past medical history.  Past Surgical History:  Procedure Laterality Date  . ANTERIOR LAT LUMBAR FUSION Right 02/05/2019   Procedure: ATTEMPTED ANTERIOR LATERAL INTERBODY FUSION;  Surgeon: Consuella Lose, MD;  Location: [place];  Service: Neurosurgery;  Laterality: Right;  RIGHT LATERAL TRANSPSOAS DISCECTOMY AND FUSION WITH ROBOTIC ASSISTED PEDICLE SCREW STABILIZATION, LUMBAR 4- LUMBAR 5  . COLONOSCOPY    . EYE SURGERY Bilateral 2013   lens implants for cataracts after Lasik  . KNEE ARTHROSCOPY Right 1980  . TONSILLECTOMY     at a very young age, but grew back    There were no vitals filed for this visit.  Subjective Assessment - 03/16/19 1013    Subjective  "I am soo stiff on both legs"    Currently in Pain?  Yes    Pain Score  5    all the muscles                      [facility] Adult PT Treatment/Exercise - 03/16/19 0001      Lumbar Exercises: Stretches   Passive Hamstring Stretch  Right;5 reps;10 seconds    Single Knee to Chest Stretch  Left;Right;4 reps;10 seconds      Lumbar Exercises: Aerobic   UBE (Upper Arm Bike)  L3 x 2 min each     Nustep  level 3 x 5 minutes      Lumbar Exercises: Standing   Row  Strengthening;Both;20 reps;Theraband    Theraband Level (Row)  Level 2 (Red)    Shoulder Extension  Theraband;Both;Power Tower;20 reps    Theraband Level (Shoulder Extension)  Level 2 (Red)    Other Standing Lumbar Exercises  Horiz abd yellow 2x10     Other Standing Lumbar Exercises  Standing march 1lb 2x5 each       Lumbar Exercises: Seated   Long Arc Quad on Chair  Both;2 sets;10 reps    LAQ on Chair Weights (lbs)  1    Sit to Stand  5 reps   x2   Other Seated Lumbar Exercises  HS curls red 2x15, rows red 2x10,     Other Seated Lumbar Exercises  OHP 3lb 2x10                PT Short Term Goals - 03/16/19 1110      PT SHORT TERM GOAL #1   Title  independent with initial HEP    Status  Achieved        PT Long Term Goals - 03/09/19 [id number]      PT LONG TERM GOAL #1   Title  increase lumbar ROM 25%    Time  8  Period  Weeks    Status  New      PT LONG TERM GOAL #2   Title  understand posture and body mechanics    Time  8    Period  Weeks    Status  New      PT LONG TERM GOAL #3   Title  increase the right hip strength to 4/5    Time  8    Period  Weeks    Status  New      PT LONG TERM GOAL #4   Title  go up and down stairs without difficulty    Time  8    Period  Weeks    Status  New      PT LONG TERM GOAL #5   Title  incresare SLR on the right to 70 degrees    Time  8    Period  Weeks    Status  New            Plan - 03/16/19 1106    Clinical Impression Statement  Did more interventions in the standing position. No reports of pain but a lot of stiffness. LOB x 1 with standing march needing min assist to correct. Decrease wt shift forward with sit to stands. Some tightness in RLE with single knee to chest and HS stretch    Comorbidities  Lumbar surgery, LBP, knee surgery    Examination-Activity Limitations  Sleep;Transfers;Stand;Locomotion Level;Bend;Bed Mobility;Lift;Stairs;Squat    Stability/Clinical Decision Making  Evolving/Moderate complexity    Rehab Potential  Good    PT  Frequency  3x / week    PT Duration  8 weeks    PT Treatment/Interventions  ADLs/Self Care Home Management;Electrical Stimulation;Moist Heat;Therapeutic activities;Therapeutic exercise;Neuromuscular re-education;Balance training;Manual techniques;Patient/family education    PT Next Visit Plan  slowly progress, no bending or twisting       Patient will benefit from skilled therapeutic intervention in order to improve the following deficits and impairments:  Abnormal gait, Improper body mechanics, Pain, Postural dysfunction, Increased muscle spasms, Decreased mobility, Decreased activity tolerance, Decreased endurance, Decreased range of motion, Decreased strength, Impaired flexibility, Difficulty walking, Decreased balance  Visit Diagnosis: Muscle weakness (generalized)  Muscle spasm of back  Difficulty in walking, not elsewhere classified  Acute right-sided low back pain with right-sided sciatica     Problem List Patient Active Problem List   Diagnosis Date Noted  . Lumbar radiculopathy 02/05/2019    [name], TPA 03/16/2019, 11:11 AM  [address] [place], [place], [address] Phone: [phone]   Fax:  [phone]  Name: Adams Menaker MRN: [id number] Date of Birth: 1950/07/11

## 2019-03-18 ENCOUNTER — Other Ambulatory Visit: Payer: Self-pay

## 2019-03-18 ENCOUNTER — Ambulatory Visit: Payer: Medicare Other | Admitting: Physical Therapy

## 2019-03-18 ENCOUNTER — Encounter: Payer: Self-pay | Admitting: Physical Therapy

## 2019-03-18 DIAGNOSIS — M5441 Lumbago with sciatica, right side: Secondary | ICD-10-CM

## 2019-03-18 DIAGNOSIS — M62830 Muscle spasm of back: Secondary | ICD-10-CM

## 2019-03-18 DIAGNOSIS — M6281 Muscle weakness (generalized): Secondary | ICD-10-CM

## 2019-03-18 DIAGNOSIS — R262 Difficulty in walking, not elsewhere classified: Secondary | ICD-10-CM

## 2019-03-18 NOTE — Therapy (Signed)
[place], [place], [address] Phone: [phone]   Fax:  [phone]  Physical Therapy Treatment  Patient Details  Name: Gerald Carpenter MRN: [id number] Date of Birth: 29-Jul-1950 Referring Provider (PT): Kathyrn Sheriff   Encounter Date: 03/18/2019  PT End of Session - 03/18/19 0821    Visit Number  5    Date for PT Re-Evaluation  05/07/19    PT Start Time  0743    PT Stop Time  0830    PT Time Calculation (min)  47 min    Activity Tolerance  Patient tolerated treatment well;Patient limited by pain    Behavior During Therapy  [facility] for tasks assessed/performed       History reviewed. No pertinent past medical history.  Past Surgical History:  Procedure Laterality Date  . ANTERIOR LAT LUMBAR FUSION Right 02/05/2019   Procedure: ATTEMPTED ANTERIOR LATERAL INTERBODY FUSION;  Surgeon: Consuella Lose, MD;  Location: [place];  Service: Neurosurgery;  Laterality: Right;  RIGHT LATERAL TRANSPSOAS DISCECTOMY AND FUSION WITH ROBOTIC ASSISTED PEDICLE SCREW STABILIZATION, LUMBAR 4- LUMBAR 5  . COLONOSCOPY    . EYE SURGERY Bilateral 2013   lens implants for cataracts after Lasik  . KNEE ARTHROSCOPY Right 1980  . TONSILLECTOMY     at a very young age, but grew back    There were no vitals filed for this visit.  Subjective Assessment - 03/18/19 0748    Subjective  Patient reports that he is pretty miserable, has pain and weakness, he is limping significantly in the morning, reports that medication helps as the day goes on, but is worse in the morning, reports pain, stiffness and weakness    Currently in Pain?  Yes    Pain Score  6     Pain Location  Leg    Pain Orientation  Right    Aggravating Factors   worse in the morning, difficulty moving    Pain Relieving Factors  medication as the day goes it gets a little better                       [facility] Adult PT Treatment/Exercise - 03/18/19  0001      Lumbar Exercises: Aerobic   UBE (Upper Arm Bike)  L3 x 2.5 min each     Nustep  level 3 x 5 minutes      Lumbar Exercises: Standing   Row  Strengthening;Both;20 reps;Theraband    Theraband Level (Row)  Level 2 (Red)    Shoulder Extension  Theraband;Both;Power Tower;20 reps    Theraband Level (Shoulder Extension)  Level 2 (Red)      Lumbar Exercises: Seated   Other Seated Lumbar Exercises  HS curls red tband, ankle PF/DF red tband, ball b/n knees squeeze 2x10, red tband hip abduction 2x10, ball in lap isometric abdominals, small weighted ball overhead press 2x10      Lumbar Exercises: Supine   Other Supine Lumbar Exercises  feet on ball K2C, small rotaiton               PT Short Term Goals - 03/16/19 1110      PT SHORT TERM GOAL #1   Title  independent with initial HEP    Status  Achieved        PT Long Term Goals - 03/18/19 [id number]      PT LONG TERM GOAL #1   Title  increase lumbar ROM 25%  Status  On-going      PT LONG TERM GOAL #2   Title  understand posture and body mechanics    Status  On-going      PT LONG TERM GOAL #3   Title  increase the right hip strength to 4/5    Status  On-going      PT LONG TERM GOAL #4   Title  go up and down stairs without difficulty    Status  On-going            Plan - 03/18/19 [id number]    Clinical Impression Statement  Patient continues to reports very weak in the right hip, he c/o stiffness and some pain in the groin area.  He is really limping around today.  He seems frustrated with his weakness and pain, I explained that we are only 5 weeks out from surgery.  And that recovery from the surgery is difficult and takes time.  I did exercises mostly in sitting today    PT Next Visit Plan  continue to adjust as he tolerates, has appointment to see the MD in February    Consulted and Agree with Plan of Care  Patient       Patient will benefit from skilled therapeutic intervention in order to improve the  following deficits and impairments:  Abnormal gait, Improper body mechanics, Pain, Postural dysfunction, Increased muscle spasms, Decreased mobility, Decreased activity tolerance, Decreased endurance, Decreased range of motion, Decreased strength, Impaired flexibility, Difficulty walking, Decreased balance  Visit Diagnosis: Muscle weakness (generalized)  Muscle spasm of back  Difficulty in walking, not elsewhere classified  Acute right-sided low back pain with right-sided sciatica     Problem List Patient Active Problem List   Diagnosis Date Noted  . Lumbar radiculopathy 02/05/2019    Sumner Boast., PT 03/18/2019, 8:27 AM  [place] [id number] [place], [place], [address] Phone: [phone]   Fax:  [phone]  Name: Gerald Carpenter MRN: [id number] Date of Birth: [birth year]

## 2019-03-20 ENCOUNTER — Ambulatory Visit: Payer: Medicare Other | Admitting: Physical Therapy

## 2019-03-20 ENCOUNTER — Other Ambulatory Visit: Payer: Self-pay

## 2019-03-20 DIAGNOSIS — M5441 Lumbago with sciatica, right side: Secondary | ICD-10-CM | POA: Diagnosis not present

## 2019-03-20 DIAGNOSIS — M6281 Muscle weakness (generalized): Secondary | ICD-10-CM

## 2019-03-20 DIAGNOSIS — M62830 Muscle spasm of back: Secondary | ICD-10-CM

## 2019-03-20 DIAGNOSIS — R262 Difficulty in walking, not elsewhere classified: Secondary | ICD-10-CM

## 2019-03-20 NOTE — Therapy (Signed)
[place], [place], [address] Phone: [phone]   Fax:  [phone]  Physical Therapy Treatment  Patient Details  Name: Gerald Carpenter MRN: [id number] Date of Birth: 1950-12-20 Referring Provider (PT): Kathyrn Sheriff   Encounter Date: 03/20/2019  PT End of Session - 03/20/19 0917    Visit Number  6    Date for PT Re-Evaluation  05/07/19    PT Start Time  0840    PT Stop Time  0927    PT Time Calculation (min)  47 min       No past medical history on file.  Past Surgical History:  Procedure Laterality Date  . ANTERIOR LAT LUMBAR FUSION Right 02/05/2019   Procedure: ATTEMPTED ANTERIOR LATERAL INTERBODY FUSION;  Surgeon: Consuella Lose, MD;  Location: [place];  Service: Neurosurgery;  Laterality: Right;  RIGHT LATERAL TRANSPSOAS DISCECTOMY AND FUSION WITH ROBOTIC ASSISTED PEDICLE SCREW STABILIZATION, LUMBAR 4- LUMBAR 5  . COLONOSCOPY    . EYE SURGERY Bilateral 2013   lens implants for cataracts after Lasik  . KNEE ARTHROSCOPY Right 1980  . TONSILLECTOMY     at a very young age, but grew back    There were no vitals filed for this visit.  Subjective Assessment - 03/20/19 0837    Subjective  " just have to get the motor going, just no strength in my legs" first night since surgery I slept 6 hours straight last night    Currently in Pain?  Yes    Pain Score  3     Pain Location  Leg    Pain Orientation  Right                       [facility] Adult PT Treatment/Exercise - 03/20/19 0001      Lumbar Exercises: Aerobic   UBE (Upper Arm Bike)  L3 x 2.5 min each     Nustep  level 3 x 5 minutes   groin is killing me on this machine     Lumbar Exercises: Machines for Strengthening   Other Lumbar Machine Exercise  rows and lat bar 20# 2 sets 10      Lumbar Exercises: Standing   Heel Raises  20 reps   black bar   Row  Strengthening;Both;20 reps;Theraband    Theraband Level (Row)   Level 2 (Red)    Shoulder Extension  Theraband;Both;Power Tower;20 reps    Theraband Level (Shoulder Extension)  Level 2 (Red)    Other Standing Lumbar Exercises  wall presses with ball for core 2 sets 10    Other Standing Lumbar Exercises  Standing march 1lb 10 each   added hip flex,ext and abd 10 each     Lumbar Exercises: Seated   Long Arc Quad on Chair  Strengthening;Both;2 sets;10 reps   tband   LAQ on Chair Limitations  red tband- paina nd fatigue on RT    Other Seated Lumbar Exercises  HS curls red tband, ankle PF/DF red tband, ball b/n knees squeeze 2x10, red tband hip abduction 2x10, ball in lap isometric abdominals,               PT Short Term Goals - 03/16/19 1110      PT SHORT TERM GOAL #1   Title  independent with initial HEP    Status  Achieved        PT Long Term Goals - 03/18/19 [id number]  PT LONG TERM GOAL #1   Title  increase lumbar ROM 25%    Status  On-going      PT LONG TERM GOAL #2   Title  understand posture and body mechanics    Status  On-going      PT LONG TERM GOAL #3   Title  increase the right hip strength to 4/5    Status  On-going      PT LONG TERM GOAL #4   Title  go up and down stairs without difficulty    Status  On-going            Plan - 03/20/19 0917    Clinical Impression Statement  added some new ex today and increased reps and pt did well, rest needed for fatigue in RT leg and some postural cuing needed with reminder to activate core with ex. pt continues to limp and have weakness in Rt leg and pain esp in groin    PT Treatment/Interventions  ADLs/Self Care Home Management;Electrical Stimulation;Moist Heat;Therapeutic activities;Therapeutic exercise;Neuromuscular re-education;Balance training;Manual techniques;Patient/family education    PT Next Visit Plan  continue to adjust as he tolerates, has appointment to see the MD in February. pt also was rec to f/u witd pain mang MD- gave some names from internet        Patient will benefit from skilled therapeutic intervention in order to improve the following deficits and impairments:  Abnormal gait, Improper body mechanics, Pain, Postural dysfunction, Increased muscle spasms, Decreased mobility, Decreased activity tolerance, Decreased endurance, Decreased range of motion, Decreased strength, Impaired flexibility, Difficulty walking, Decreased balance  Visit Diagnosis: Muscle spasm of back  Muscle weakness (generalized)  Difficulty in walking, not elsewhere classified  Acute right-sided low back pain with right-sided sciatica     Problem List Patient Active Problem List   Diagnosis Date Noted  . Lumbar radiculopathy 02/05/2019    Kyree Adriano,ANGIE PTA 03/20/2019, 9:22 AM  [place] [address] [place] [address] [place], [place], [address] Phone: [phone]   Fax:  [phone]  Name: Gerald Carpenter MRN: [id number] Date of Birth: 07/09/1950

## 2019-03-23 ENCOUNTER — Other Ambulatory Visit: Payer: Self-pay

## 2019-03-23 ENCOUNTER — Encounter: Payer: Self-pay | Admitting: Physical Therapy

## 2019-03-23 ENCOUNTER — Ambulatory Visit: Payer: Medicare Other | Attending: Neurosurgery | Admitting: Physical Therapy

## 2019-03-23 DIAGNOSIS — M5441 Lumbago with sciatica, right side: Secondary | ICD-10-CM | POA: Diagnosis present

## 2019-03-23 DIAGNOSIS — M62830 Muscle spasm of back: Secondary | ICD-10-CM | POA: Diagnosis present

## 2019-03-23 DIAGNOSIS — R262 Difficulty in walking, not elsewhere classified: Secondary | ICD-10-CM | POA: Diagnosis present

## 2019-03-23 DIAGNOSIS — M6281 Muscle weakness (generalized): Secondary | ICD-10-CM | POA: Diagnosis present

## 2019-03-23 NOTE — Therapy (Signed)
[place], [place], [address] Phone: [phone]   Fax:  [phone]  Physical Therapy Treatment  Patient Details  Name: Gerald Carpenter MRN: [id number] Date of Birth: [birth year] Referring Provider (PT): Kathyrn Sheriff   Encounter Date: 03/23/2019  PT End of Session - 03/23/19 0827    Visit Number  7    Date for PT Re-Evaluation  05/07/19    PT Start Time  0744    PT Stop Time  0830    PT Time Calculation (min)  46 min    Equipment Utilized During Treatment  Back brace    Activity Tolerance  Patient tolerated treatment well;Patient limited by pain    Behavior During Therapy  [facility] for tasks assessed/performed       History reviewed. No pertinent past medical history.  Past Surgical History:  Procedure Laterality Date  . ANTERIOR LAT LUMBAR FUSION Right 02/05/2019   Procedure: ATTEMPTED ANTERIOR LATERAL INTERBODY FUSION;  Surgeon: Consuella Lose, MD;  Location: [place];  Service: Neurosurgery;  Laterality: Right;  RIGHT LATERAL TRANSPSOAS DISCECTOMY AND FUSION WITH ROBOTIC ASSISTED PEDICLE SCREW STABILIZATION, LUMBAR 4- LUMBAR 5  . COLONOSCOPY    . EYE SURGERY Bilateral 2013   lens implants for cataracts after Lasik  . KNEE ARTHROSCOPY Right 1980  . TONSILLECTOMY     at a very young age, but grew back    There were no vitals filed for this visit.  Subjective Assessment - 03/23/19 0746    Subjective  Patient reports that he was much worse after the last treatment, very sore and hard to move and walk    Currently in Pain?  Yes    Pain Score  6     Pain Location  Leg    Pain Orientation  Right    Aggravating Factors   walking                       [facility] Adult PT Treatment/Exercise - 03/23/19 0001      Lumbar Exercises: Stretches   Passive Hamstring Stretch  Right;5 reps;10 seconds      Lumbar Exercises: Aerobic   Elliptical  1 minute    UBE (Upper Arm Bike)  L3 x 3 min  each     Recumbent Bike  1 minute pain in tjhe right thigh      Lumbar Exercises: Machines for Strengthening   Cybex Knee Flexion  15# 2x10    Leg Press  no weight x10, 20# small ROM 3x5    Other Lumbar Machine Exercise  rows and lat bar 20# 2 sets 10    Other Lumbar Machine Exercise  5# chest press, 5# straight arm pulls      Lumbar Exercises: Standing   Other Standing Lumbar Exercises  light resisted gait forward and backward      Lumbar Exercises: Supine   Other Supine Lumbar Exercises  feet on ball K2C, small rotaiton, small bridge, isometric abs    Other Supine Lumbar Exercises  hooklying ball b/n knees squeeze               PT Short Term Goals - 03/16/19 1110      PT SHORT TERM GOAL #1   Title  independent with initial HEP    Status  Achieved        PT Long Term Goals - 03/18/19 0826      PT LONG TERM GOAL #1  Title  increase lumbar ROM 25%    Status  On-going      PT LONG TERM GOAL #2   Title  understand posture and body mechanics    Status  On-going      PT LONG TERM GOAL #3   Title  increase the right hip strength to 4/5    Status  On-going      PT LONG TERM GOAL #4   Title  go up and down stairs without difficulty    Status  On-going            Plan - 03/23/19 0829    Clinical Impression Statement  Patient really does not tolerate the bike or the NuStep, this causes pain in the right hip and groin, he did tolerate the elliptical better than expected, I tried to add a few different exercises and see how he did, seemed to do well but he has really had some problems in the past with our exercises    PT Next Visit Plan  see how the new exercises did    Consulted and Agree with Plan of Care  Patient       Patient will benefit from skilled therapeutic intervention in order to improve the following deficits and impairments:  Abnormal gait, Improper body mechanics, Pain, Postural dysfunction, Increased muscle spasms, Decreased mobility, Decreased  activity tolerance, Decreased endurance, Decreased range of motion, Decreased strength, Impaired flexibility, Difficulty walking, Decreased balance  Visit Diagnosis: Muscle spasm of back  Muscle weakness (generalized)  Difficulty in walking, not elsewhere classified  Acute right-sided low back pain with right-sided sciatica     Problem List Patient Active Problem List   Diagnosis Date Noted  . Lumbar radiculopathy 02/05/2019    Sumner Boast., PT 03/23/2019, 8:34 AM  [place] [address] [place], [place], [address] Phone: [phone]   Fax:  [phone]  Name: Gerald Carpenter MRN: [id number] Date of Birth: 06/23/1950

## 2019-03-25 ENCOUNTER — Other Ambulatory Visit: Payer: Self-pay

## 2019-03-25 ENCOUNTER — Encounter: Payer: Self-pay | Admitting: Physical Therapy

## 2019-03-25 ENCOUNTER — Ambulatory Visit: Payer: Medicare Other | Admitting: Physical Therapy

## 2019-03-25 DIAGNOSIS — M6281 Muscle weakness (generalized): Secondary | ICD-10-CM

## 2019-03-25 DIAGNOSIS — M62830 Muscle spasm of back: Secondary | ICD-10-CM

## 2019-03-25 DIAGNOSIS — M5441 Lumbago with sciatica, right side: Secondary | ICD-10-CM

## 2019-03-25 DIAGNOSIS — R262 Difficulty in walking, not elsewhere classified: Secondary | ICD-10-CM

## 2019-03-25 NOTE — Therapy (Signed)
[place], [place], [address] Phone: [phone]   Fax:  [phone]  Physical Therapy Treatment  Patient Details  Name: Gerald Carpenter MRN: [id number] Date of Birth: 1950-04-02 Referring Provider (PT): Kathyrn Sheriff   Encounter Date: 03/25/2019  PT End of Session - 03/25/19 0837    Visit Number  8    Date for PT Re-Evaluation  05/07/19    PT Start Time  0746    PT Stop Time  0837    PT Time Calculation (min)  51 min    Equipment Utilized During Treatment  Back brace    Activity Tolerance  Patient tolerated treatment well;Patient limited by pain    Behavior During Therapy  [facility] for tasks assessed/performed       History reviewed. No pertinent past medical history.  Past Surgical History:  Procedure Laterality Date  . ANTERIOR LAT LUMBAR FUSION Right 02/05/2019   Procedure: ATTEMPTED ANTERIOR LATERAL INTERBODY FUSION;  Surgeon: Consuella Lose, MD;  Location: [place];  Service: Neurosurgery;  Laterality: Right;  RIGHT LATERAL TRANSPSOAS DISCECTOMY AND FUSION WITH ROBOTIC ASSISTED PEDICLE SCREW STABILIZATION, LUMBAR 4- LUMBAR 5  . COLONOSCOPY    . EYE SURGERY Bilateral 2013   lens implants for cataracts after Lasik  . KNEE ARTHROSCOPY Right 1980  . TONSILLECTOMY     at a very young age, but grew back    There were no vitals filed for this visit.  Subjective Assessment - 03/25/19 0756    Subjective  Ok, just hurt all the time, it dies off a little after each day    Currently in Pain?  Yes    Pain Score  4     Pain Location  Back                       [facility] Adult PT Treatment/Exercise - 03/25/19 0001      Lumbar Exercises: Aerobic   Elliptical  I3 R3 x 2 min     UBE (Upper Arm Bike)  L3 x 3 min each       Lumbar Exercises: Machines for Strengthening   Cybex Knee Flexion  15# 3x10    Leg Press  no weight 2x5 LLE , 20# small ROM 2x10    Other Lumbar Machine Exercise   rows and lat bar 20# 2 sets 10    Other Lumbar Machine Exercise  5# chest press 2x10       Lumbar Exercises: Standing   Shoulder Extension  Theraband;Both;Power Tower;20 reps    Shoulder Extension Limitations  5    Other Standing Lumbar Exercises  light resisted gait forward and backward 10lb x5 each    Other Standing Lumbar Exercises  alt 4in box taps x5 each, Lateral 6 in box taps x 10 each       Lumbar Exercises: Seated   Sit to Stand  5 reps   x3 airex on mat table    Other Seated Lumbar Exercises  fiter presses RLE 1 back 1 blue x15 x10      Lumbar Exercises: Supine   Other Supine Lumbar Exercises  feet on ball K2C, small rotaiton, small bridge, isometric abs               PT Short Term Goals - 03/16/19 1110      PT SHORT TERM GOAL #1   Title  independent with initial HEP    Status  Achieved  PT Long Term Goals - 03/18/19 0826      PT LONG TERM GOAL #1   Title  increase lumbar ROM 25%    Status  On-going      PT LONG TERM GOAL #2   Title  understand posture and body mechanics    Status  On-going      PT LONG TERM GOAL #3   Title  increase the right hip strength to 4/5    Status  On-going      PT LONG TERM GOAL #4   Title  go up and down stairs without difficulty    Status  On-going            Plan - 03/25/19 [id number]    Clinical Impression Statement  Pt able to increase tim on elliptical aerobic warm up with one rest break needed. Groin pulling/discomfort reported with fitter press and SL on leg press. Cues to bent R knee during resisted gait with backwards walking. Cues to keep shoulders back with seated rows.    Comorbidities  Lumbar surgery, LBP, knee surgery    Examination-Activity Limitations  Sleep;Transfers;Stand;Locomotion Level;Bend;Bed Mobility;Lift;Stairs;Squat    Stability/Clinical Decision Making  Evolving/Moderate complexity    Rehab Potential  Good    PT Duration  8 weeks    PT Treatment/Interventions  ADLs/Self Care Home  Management;Electrical Stimulation;Moist Heat;Therapeutic activities;Therapeutic exercise;Neuromuscular re-education;Balance training;Manual techniques;Patient/family education    PT Next Visit Plan  see how the new exercises did       Patient will benefit from skilled therapeutic intervention in order to improve the following deficits and impairments:  Abnormal gait, Improper body mechanics, Pain, Postural dysfunction, Increased muscle spasms, Decreased mobility, Decreased activity tolerance, Decreased endurance, Decreased range of motion, Decreased strength, Impaired flexibility, Difficulty walking, Decreased balance  Visit Diagnosis: Muscle weakness (generalized)  Difficulty in walking, not elsewhere classified  Acute right-sided low back pain with right-sided sciatica  Muscle spasm of back     Problem List Patient Active Problem List   Diagnosis Date Noted  . Lumbar radiculopathy 02/05/2019    [name], PTA 03/25/2019, 8:43 AM  [place] [address] [place], [place], [address] Phone: [phone]   Fax:  [phone]  Name: Gerald Carpenter MRN: [id number] Date of Birth: 09/09/1950

## 2019-03-27 ENCOUNTER — Other Ambulatory Visit: Payer: Self-pay

## 2019-03-27 ENCOUNTER — Ambulatory Visit: Payer: Medicare Other | Admitting: Physical Therapy

## 2019-03-27 ENCOUNTER — Encounter: Payer: Self-pay | Admitting: Physical Therapy

## 2019-03-27 DIAGNOSIS — R262 Difficulty in walking, not elsewhere classified: Secondary | ICD-10-CM

## 2019-03-27 DIAGNOSIS — M62830 Muscle spasm of back: Secondary | ICD-10-CM

## 2019-03-27 DIAGNOSIS — M5441 Lumbago with sciatica, right side: Secondary | ICD-10-CM

## 2019-03-27 DIAGNOSIS — M6281 Muscle weakness (generalized): Secondary | ICD-10-CM

## 2019-03-27 NOTE — Therapy (Signed)
[place], [place], [address] Phone: [phone]   Fax:  [phone]  Physical Therapy Treatment  Patient Details  Name: Neko Boyajian MRN: [id number] Date of Birth: November 09, 1950 Referring Provider (PT): Kathyrn Sheriff   Encounter Date: 03/27/2019  PT End of Session - 03/27/19 0909    Visit Number  9    Date for PT Re-Evaluation  05/07/19    PT Start Time  0834    PT Stop Time  0920    PT Time Calculation (min)  46 min    Equipment Utilized During Treatment  Back brace    Activity Tolerance  Patient tolerated treatment well;Patient limited by pain    Behavior During Therapy  [facility] for tasks assessed/performed       History reviewed. No pertinent past medical history.  Past Surgical History:  Procedure Laterality Date  . ANTERIOR LAT LUMBAR FUSION Right 02/05/2019   Procedure: ATTEMPTED ANTERIOR LATERAL INTERBODY FUSION;  Surgeon: Consuella Lose, MD;  Location: [place];  Service: Neurosurgery;  Laterality: Right;  RIGHT LATERAL TRANSPSOAS DISCECTOMY AND FUSION WITH ROBOTIC ASSISTED PEDICLE SCREW STABILIZATION, LUMBAR 4- LUMBAR 5  . COLONOSCOPY    . EYE SURGERY Bilateral 2013   lens implants for cataracts after Lasik  . KNEE ARTHROSCOPY Right 1980  . TONSILLECTOMY     at a very young age, but grew back    There were no vitals filed for this visit.  Subjective Assessment - 03/27/19 0837    Subjective  I am alive, reports pain in the back and the right hip, reports difficulty sleeping    Currently in Pain?  Yes    Pain Score  5     Pain Location  Back    Aggravating Factors   walking         [facility] PT Assessment - 03/27/19 0001      Assessment   Medical Diagnosis  s/p lumbar fusion    Referring Provider (PT)  Kathyrn Sheriff                   [facility] Adult PT Treatment/Exercise - 03/27/19 0001      Lumbar Exercises: Aerobic   Elliptical  I=8 R=5 x 2 minutes with a rest break    UBE (Upper Arm Bike)  L4 x 3 min each       Lumbar Exercises: Machines for Strengthening   Cybex Knee Extension  5# 2x10    Cybex Knee Flexion  15# 3x10    Leg Press  20# 2x10 with a very small ROM to activate core, quads and glutes.  Then no weight x 10 for the ROM    Other Lumbar Machine Exercise  rows and lat bar 20# 2 sets 10    Other Lumbar Machine Exercise  5# chest press 2x10 , 20# triceps with core activation      Lumbar Exercises: Standing   Other Standing Lumbar Exercises  2.5# marching, hip abduction an extnesion with walker for balance and posture, needed close CGA due to weakness in the right LE with standing on it      Lumbar Exercises: Seated   Sit to Stand Limitations  10 reps  2 sets of 5 holding 5#               PT Short Term Goals - 03/16/19 1110      PT SHORT TERM GOAL #1   Title  independent with initial HEP  Status  Achieved        PT Long Term Goals - 03/27/19 0913      PT LONG TERM GOAL #1   Title  increase lumbar ROM 25%    Status  On-going      PT LONG TERM GOAL #2   Title  understand posture and body mechanics    Status  Partially Met      PT LONG TERM GOAL #3   Title  increase the right hip strength to 4/5    Status  On-going      PT LONG TERM GOAL #4   Title  go up and down stairs without difficulty    Status  On-going      PT LONG TERM GOAL #5   Title  incresare SLR on the right to 70 degrees    Status  On-going            Plan - 03/27/19 0910    Clinical Impression Statement  Patient has been seen 9 visits s/p his lumbar surgery, he is getting stronger and moving better, however he is still having pain especially in the right groin with walking and trying to perform right hip flexion.  He reports at times pain can be 9/10 and then there are times that he can be pain free.  He reports that he limped and had difficulty walking for about 4 years prior to the surgery.  He needs cues to engage core with exercises and some  cues for form    PT Next Visit Plan  we will advance as tolerated with strength and function.  Would like him walking more but his limp worries me with all of the compensation and trunk lean, I do not want him to injure himself    Consulted and Agree with Plan of Care  Patient       Patient will benefit from skilled therapeutic intervention in order to improve the following deficits and impairments:  Abnormal gait, Improper body mechanics, Pain, Postural dysfunction, Increased muscle spasms, Decreased mobility, Decreased activity tolerance, Decreased endurance, Decreased range of motion, Decreased strength, Impaired flexibility, Difficulty walking, Decreased balance  Visit Diagnosis: Muscle weakness (generalized)  Difficulty in walking, not elsewhere classified  Acute right-sided low back pain with right-sided sciatica  Muscle spasm of back     Problem List Patient Active Problem List   Diagnosis Date Noted  . Lumbar radiculopathy 02/05/2019    Sumner Boast., PT 03/27/2019, 9:14 AM  [place] [address] [place], [place], [address] Phone: [phone]   Fax:  [phone]  Name: Maury Groninger MRN: [id number] Date of Birth: [birth year]

## 2019-03-31 ENCOUNTER — Other Ambulatory Visit: Payer: Self-pay

## 2019-03-31 ENCOUNTER — Encounter: Payer: Self-pay | Admitting: Physical Therapy

## 2019-03-31 ENCOUNTER — Ambulatory Visit: Payer: Medicare Other | Admitting: Physical Therapy

## 2019-03-31 DIAGNOSIS — M6281 Muscle weakness (generalized): Secondary | ICD-10-CM

## 2019-03-31 DIAGNOSIS — M5441 Lumbago with sciatica, right side: Secondary | ICD-10-CM

## 2019-03-31 DIAGNOSIS — M62830 Muscle spasm of back: Secondary | ICD-10-CM | POA: Diagnosis not present

## 2019-03-31 DIAGNOSIS — R262 Difficulty in walking, not elsewhere classified: Secondary | ICD-10-CM

## 2019-03-31 NOTE — Therapy (Signed)
[address] [place], [place], [address] Phone: [phone]   Fax:  [phone] Progress Note Reporting Period 03/09/19 to 03/31/19 for the first 10 visits  See note below for Objective Data and Assessment of Progress/Goals.      Physical Therapy Treatment  Patient Details  Name: Gerald Carpenter MRN: [id number] Date of Birth: February 27, 1950 Referring Provider (PT): Kathyrn Sheriff   Encounter Date: 03/31/2019  PT End of Session - 03/31/19 0954    Visit Number  10    Date for PT Re-Evaluation  05/07/19    PT Start Time  0915    PT Stop Time  0955    PT Time Calculation (min)  40 min    Activity Tolerance  Patient tolerated treatment well;Patient limited by pain    Behavior During Therapy  [facility] for tasks assessed/performed       History reviewed. No pertinent past medical history.  Past Surgical History:  Procedure Laterality Date  . ANTERIOR LAT LUMBAR FUSION Right 02/05/2019   Procedure: ATTEMPTED ANTERIOR LATERAL INTERBODY FUSION;  Surgeon: Consuella Lose, MD;  Location: [place];  Service: Neurosurgery;  Laterality: Right;  RIGHT LATERAL TRANSPSOAS DISCECTOMY AND FUSION WITH ROBOTIC ASSISTED PEDICLE SCREW STABILIZATION, LUMBAR 4- LUMBAR 5  . COLONOSCOPY    . EYE SURGERY Bilateral 2013   lens implants for cataracts after Lasik  . KNEE ARTHROSCOPY Right 1980  . TONSILLECTOMY     at a very young age, but grew back    There were no vitals filed for this visit.  Subjective Assessment - 03/31/19 0912    Subjective  Pt reports that he went to MD yesterday. He Reports that the MD wanted to go in through his side but was unable to get around the nerve. So the MD thinks that he may have aggravated his groin and the hip    Currently in Pain?  No/denies    Pain Score  3     Pain Location  Back                       [facility] Adult PT Treatment/Exercise - 03/31/19 0001      Lumbar Exercises: Aerobic    Elliptical  I=8 R=5 x 3 minutes with a rest break    UBE (Upper Arm Bike)  L4 x 3 min each       Lumbar Exercises: Machines for Strengthening   Cybex Knee Extension  5# 3x10    Cybex Knee Flexion  15# 3x10    Leg Press  20# 3x10 with a very small ROM to activate core, quads and glutes.  Then no weight x 10 for the ROM    Other Lumbar Machine Exercise  rows and lat bar 20# 2 sets 10    Other Lumbar Machine Exercise  5# chest press 2x10 , 20# triceps with core activation      Lumbar Exercises: Standing   Other Standing Lumbar Exercises  4 incha forward and lateral step up  1 set x 10 reps     Other Standing Lumbar Exercises  alt 4in box taps x5 each, Lateral 6 in box taps x 10 each       Lumbar Exercises: Seated   Sit to Stand Limitations  10 reps  2 sets of 5 holding 5#               PT Short Term Goals - 03/16/19 1110  PT SHORT TERM GOAL #1   Title  independent with initial HEP    Status  Achieved        PT Long Term Goals - 03/27/19 0913      PT LONG TERM GOAL #1   Title  increase lumbar ROM 25%    Status  On-going      PT LONG TERM GOAL #2   Title  understand posture and body mechanics    Status  Partially Met      PT LONG TERM GOAL #3   Title  increase the right hip strength to 4/5    Status  On-going      PT LONG TERM GOAL #4   Title  go up and down stairs without difficulty    Status  On-going      PT LONG TERM GOAL #5   Title  incresare SLR on the right to 70 degrees    Status  On-going            Plan - 03/31/19 0954    Clinical Impression Statement  No back brace today. He reports that the MD put him on a anti-inflammatory. He also reports that the MD stated he could be over doing it. He did well with all machine level interventions. Limited ROM with leg press. Decrease anterior lean with sit to stand. Some compensation with step up intervention when using LLE    Personal Factors and Comorbidities  Comorbidity 2    Comorbidities   Lumbar surgery, LBP, knee surgery    Examination-Activity Limitations  Sleep;Transfers;Stand;Locomotion Level;Bend;Bed Mobility;Lift;Stairs;Squat    Stability/Clinical Decision Making  Evolving/Moderate complexity    Rehab Potential  Good    PT Frequency  2x / week    PT Duration  8 weeks    PT Treatment/Interventions  ADLs/Self Care Home Management;Electrical Stimulation;Moist Heat;Therapeutic activities;Therapeutic exercise;Neuromuscular re-education;Balance training;Manual techniques;Patient/family education    PT Next Visit Plan  Spoke to lead PT will decrease to 2x/week       Patient will benefit from skilled therapeutic intervention in order to improve the following deficits and impairments:  Abnormal gait, Improper body mechanics, Pain, Postural dysfunction, Increased muscle spasms, Decreased mobility, Decreased activity tolerance, Decreased endurance, Decreased range of motion, Decreased strength, Impaired flexibility, Difficulty walking, Decreased balance  Visit Diagnosis: Muscle weakness (generalized)  Difficulty in walking, not elsewhere classified  Acute right-sided low back pain with right-sided sciatica     Problem List Patient Active Problem List   Diagnosis Date Noted  . Lumbar radiculopathy 02/05/2019    [name] 03/31/2019, 9:58 AM  [address] [place], [place], [address] Phone: [phone]   Fax:  [phone]  Name: Gerald Carpenter MRN: [id number] Date of Birth: [birth year]

## 2019-04-02 ENCOUNTER — Other Ambulatory Visit: Payer: Self-pay

## 2019-04-02 ENCOUNTER — Encounter: Payer: Self-pay | Admitting: Physical Therapy

## 2019-04-02 ENCOUNTER — Ambulatory Visit: Payer: Medicare Other | Admitting: Physical Therapy

## 2019-04-02 DIAGNOSIS — M5441 Lumbago with sciatica, right side: Secondary | ICD-10-CM

## 2019-04-02 DIAGNOSIS — M62830 Muscle spasm of back: Secondary | ICD-10-CM

## 2019-04-02 DIAGNOSIS — M6281 Muscle weakness (generalized): Secondary | ICD-10-CM

## 2019-04-02 DIAGNOSIS — R262 Difficulty in walking, not elsewhere classified: Secondary | ICD-10-CM

## 2019-04-02 NOTE — Therapy (Signed)
[place] [place] [place], [place], [address] Phone: [phone]   Fax:  [phone]  Physical Therapy Treatment  Patient Details  Name: Gerald Carpenter MRN: [id number] Date of Birth: Jun 26, 1950 Referring Provider (PT): Kathyrn Sheriff   Encounter Date: 04/02/2019  PT End of Session - 04/02/19 0835    Visit Number  11    Date for PT Re-Evaluation  05/07/19    PT Start Time  0748    PT Stop Time  0828    PT Time Calculation (min)  40 min    Activity Tolerance  Patient limited by pain    Behavior During Therapy  [facility] for tasks assessed/performed       History reviewed. No pertinent past medical history.  Past Surgical History:  Procedure Laterality Date  . ANTERIOR LAT LUMBAR FUSION Right 02/05/2019   Procedure: ATTEMPTED ANTERIOR LATERAL INTERBODY FUSION;  Surgeon: Consuella Lose, MD;  Location: [place];  Service: Neurosurgery;  Laterality: Right;  RIGHT LATERAL TRANSPSOAS DISCECTOMY AND FUSION WITH ROBOTIC ASSISTED PEDICLE SCREW STABILIZATION, LUMBAR 4- LUMBAR 5  . COLONOSCOPY    . EYE SURGERY Bilateral 2013   lens implants for cataracts after Lasik  . KNEE ARTHROSCOPY Right 1980  . TONSILLECTOMY     at a very young age, but grew back    There were no vitals filed for this visit.  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  Yes    Pain Score  6     Pain Location  Leg    Pain Orientation  Right    Aggravating Factors   walking, in and out of the truck                       [facility] Adult PT Treatment/Exercise - 04/02/19 0001      Lumbar Exercises: Stretches   Passive Hamstring Stretch  Right;5 reps;10 seconds;Left    Piriformis Stretch   Left;5 reps;10 seconds      Lumbar Exercises: Aerobic   Elliptical  I=1 R=2 x 1 minute    UBE (Upper Arm Bike)  L4 x 3 min each       Lumbar Exercises: Machines for Strengthening   Other Lumbar Machine Exercise  rows and lat bar 20# 2 sets 10    Other Lumbar Machine Exercise  10# chest press 2x10 , 20# triceps with core activation, 10# biceps, 5# straight arm pulls on the pulleys      Lumbar Exercises: Supine   Other Supine Lumbar Exercises  feet on ball K2C, small rotaiton, small bridge, isometric abs               PT Short Term Goals - 03/16/19 1110      PT SHORT TERM GOAL #1   Title  independent with initial HEP    Status  Achieved        PT Long Term Goals - 04/02/19 0837      PT LONG TERM GOAL #1   Title  increase lumbar ROM 25%    Status  On-going            Plan - 04/02/19  0836    Clinical Impression Statement  Patient with increaesd right leg pain today after being active shopping some yesterday, he is limping more and having more difficulty walking, I backed off the leg exercises today and focused on the arms and needed cues for core activation and posture    PT Next Visit Plan  will continue to try to advance him, he reports that he will be getting new me3dicine this weekend    Consulted and Agree with Plan of Care  Patient       Patient will benefit from skilled therapeutic intervention in order to improve the following deficits and impairments:  Abnormal gait, Improper body mechanics, Pain, Postural dysfunction, Increased muscle spasms, Decreased mobility, Decreased activity tolerance, Decreased endurance, Decreased range of motion, Decreased strength, Impaired flexibility, Difficulty walking, Decreased balance  Visit Diagnosis: Muscle weakness (generalized)  Difficulty in walking, not elsewhere classified  Acute right-sided low back pain with right-sided sciatica  Muscle spasm of back     Problem List Patient Active Problem List    Diagnosis Date Noted  . Lumbar radiculopathy 02/05/2019    Sumner Boast., PT 04/02/2019, 8:38 AM  [address] [place], [place], [address] Phone: [phone]   Fax:  [phone]  Name: Gerald Carpenter MRN: [id number] Date of Birth: 19-Jul-1950

## 2019-04-07 ENCOUNTER — Encounter: Payer: Self-pay | Admitting: Physical Therapy

## 2019-04-07 ENCOUNTER — Other Ambulatory Visit: Payer: Self-pay

## 2019-04-07 ENCOUNTER — Ambulatory Visit: Payer: Medicare Other | Admitting: Physical Therapy

## 2019-04-07 DIAGNOSIS — M5441 Lumbago with sciatica, right side: Secondary | ICD-10-CM

## 2019-04-07 DIAGNOSIS — R262 Difficulty in walking, not elsewhere classified: Secondary | ICD-10-CM

## 2019-04-07 DIAGNOSIS — M6281 Muscle weakness (generalized): Secondary | ICD-10-CM

## 2019-04-07 DIAGNOSIS — M62830 Muscle spasm of back: Secondary | ICD-10-CM | POA: Diagnosis not present

## 2019-04-07 NOTE — Therapy (Signed)
[place] [place], [place], [address] Phone: [phone]   Fax:  [phone]  Physical Therapy Treatment  Patient Details  Name: Gerald Carpenter MRN: [id number] Date of Birth: 1950/07/30 Referring Provider (PT): Kathyrn Sheriff   Encounter Date: 04/07/2019  PT End of Session - 04/07/19 0945    Visit Number  12    Date for PT Re-Evaluation  05/07/19    PT Start Time  0905    PT Stop Time  0945    PT Time Calculation (min)  40 min    Activity Tolerance  Patient tolerated treatment well    Behavior During Therapy  [facility] for tasks assessed/performed       History reviewed. No pertinent past medical history.  Past Surgical History:  Procedure Laterality Date  . ANTERIOR LAT LUMBAR FUSION Right 02/05/2019   Procedure: ATTEMPTED ANTERIOR LATERAL INTERBODY FUSION;  Surgeon: Consuella Lose, MD;  Location: [place];  Service: Neurosurgery;  Laterality: Right;  RIGHT LATERAL TRANSPSOAS DISCECTOMY AND FUSION WITH ROBOTIC ASSISTED PEDICLE SCREW STABILIZATION, LUMBAR 4- LUMBAR 5  . COLONOSCOPY    . EYE SURGERY Bilateral 2013   lens implants for cataracts after Lasik  . KNEE ARTHROSCOPY Right 1980  . TONSILLECTOMY     at a very young age, but grew back    There were no vitals filed for this visit.  Subjective Assessment - 04/07/19 0905    Subjective  Pt reports that the anti inflammatory has really helped his groin area, it is still tender but not as much    Currently in Pain?  Yes    Pain Score  2     Pain Location  Leg    Pain Orientation  Right                       [facility] Adult PT Treatment/Exercise - 04/07/19 0001      Lumbar Exercises: Aerobic   Elliptical  I=5 R=3 x 2 minute forward 1 min backwards    UBE (Upper Arm Bike)  L4 x 3 min each       Lumbar Exercises: Machines for Strengthening   Cybex Knee Extension  5# 3x10    Cybex Knee Flexion  15# 3x10    Other Lumbar Machine Exercise   rows and lat bar 20# 2 sets 10    Other Lumbar Machine Exercise  10# chest press 2x10 , 20# triceps with core activation, 10# biceps, 15# straight arm pulls on the pulleys      Lumbar Exercises: Standing   Other Standing Lumbar Exercises  6 incha forward and lateral step up  1 set x 10 reps       Lumbar Exercises: Seated   Sit to Stand Limitations  10 reps  2 sets of 5 holding yellow ball                PT Short Term Goals - 03/16/19 1110      PT SHORT TERM GOAL #1   Title  independent with initial HEP    Status  Achieved        PT Long Term Goals - 04/02/19 0837      PT LONG TERM GOAL #1   Title  increase lumbar ROM 25%    Status  On-going            Plan - 04/07/19 0950    Clinical Impression Statement  Pt report improved  mobility since starting his new med's. Added some more machine level and functional interventions today without issue. Some compensation noted with lateral step ups. Decrease froward hinge noted with sit to stands. Cues needed for core activation during straight arm pull downs.    Personal Factors and Comorbidities  Comorbidity 2    Comorbidities  Lumbar surgery, LBP, knee surgery    Examination-Activity Limitations  Sleep;Transfers;Stand;Locomotion Level;Bend;Bed Mobility;Lift;Stairs;Squat    Stability/Clinical Decision Making  Evolving/Moderate complexity    Rehab Potential  Good    PT Frequency  2x / week    PT Duration  8 weeks    PT Treatment/Interventions  ADLs/Self Care Home Management;Electrical Stimulation;Moist Heat;Therapeutic activities;Therapeutic exercise;Neuromuscular re-education;Balance training;Manual techniques;Patient/family education    PT Next Visit Plan  will continue to try to advance him, he reports that he has his new medicine       Patient will benefit from skilled therapeutic intervention in order to improve the following deficits and impairments:  Abnormal gait, Improper body mechanics, Pain, Postural dysfunction,  Increased muscle spasms, Decreased mobility, Decreased activity tolerance, Decreased endurance, Decreased range of motion, Decreased strength, Impaired flexibility, Difficulty walking, Decreased balance  Visit Diagnosis: Acute right-sided low back pain with right-sided sciatica  Muscle spasm of back  Difficulty in walking, not elsewhere classified  Muscle weakness (generalized)     Problem List Patient Active Problem List   Diagnosis Date Noted  . Lumbar radiculopathy 02/05/2019    [name], PTA 04/07/2019, 9:53 AM  [address] [place], [place], [address] Phone: [phone]   Fax:  [phone]  Name: Gerald Carpenter MRN: [id number] Date of Birth: 1950-10-16

## 2019-04-10 ENCOUNTER — Encounter: Payer: Self-pay | Admitting: Physical Therapy

## 2019-04-10 ENCOUNTER — Ambulatory Visit: Payer: Medicare Other | Admitting: Physical Therapy

## 2019-04-10 ENCOUNTER — Other Ambulatory Visit: Payer: Self-pay

## 2019-04-10 DIAGNOSIS — R262 Difficulty in walking, not elsewhere classified: Secondary | ICD-10-CM

## 2019-04-10 DIAGNOSIS — M62830 Muscle spasm of back: Secondary | ICD-10-CM | POA: Diagnosis not present

## 2019-04-10 DIAGNOSIS — M5441 Lumbago with sciatica, right side: Secondary | ICD-10-CM

## 2019-04-10 DIAGNOSIS — M6281 Muscle weakness (generalized): Secondary | ICD-10-CM

## 2019-04-10 NOTE — Therapy (Signed)
[place], [place], [address] Phone: [phone]   Fax:  [phone]  Physical Therapy Treatment  Patient Details  Name: Ekam Besson MRN: [id number] Date of Birth: [birth year] Referring Provider (PT): Kathyrn Sheriff   Encounter Date: 04/10/2019  PT End of Session - 04/10/19 0928    Visit Number  13    Date for PT Re-Evaluation  05/07/19    PT Start Time  0845    PT Stop Time  0930    PT Time Calculation (min)  45 min    Activity Tolerance  Patient tolerated treatment well    Behavior During Therapy  [facility] for tasks assessed/performed       History reviewed. No pertinent past medical history.  Past Surgical History:  Procedure Laterality Date  . ANTERIOR LAT LUMBAR FUSION Right 02/05/2019   Procedure: ATTEMPTED ANTERIOR LATERAL INTERBODY FUSION;  Surgeon: Consuella Lose, MD;  Location: [place];  Service: Neurosurgery;  Laterality: Right;  RIGHT LATERAL TRANSPSOAS DISCECTOMY AND FUSION WITH ROBOTIC ASSISTED PEDICLE SCREW STABILIZATION, LUMBAR 4- LUMBAR 5  . COLONOSCOPY    . EYE SURGERY Bilateral 2013   lens implants for cataracts after Lasik  . KNEE ARTHROSCOPY Right 1980  . TONSILLECTOMY     at a very young age, but grew back    There were no vitals filed for this visit.  Subjective Assessment - 04/10/19 0849    Subjective  Stiff today, Forgot to take med's last night    Currently in Pain?  Yes    Pain Score  1    muscle soreness   Pain Location  Leg    Pain Orientation  Left;Right                       [facility] Adult PT Treatment/Exercise - 04/10/19 0001      Lumbar Exercises: Aerobic   Elliptical  I=5 R=3 x 2 minute forward 1 min backwards    UBE (Upper Arm Bike)  L4 x 3 min each       Lumbar Exercises: Machines for Strengthening   Leg Press  20# 3x10 with a very small ROM to activate core, quads and glutes.     Other Lumbar Machine Exercise  rows and lat bar 20# 2  sets 15    Other Lumbar Machine Exercise  10# chest press 2x10 , 20# triceps with core activation, 10# biceps      Lumbar Exercises: Standing   Heel Raises  20 reps;2 seconds    Other Standing Lumbar Exercises  6 incha forward and lateral step up  1 set x 10 reps       Manual Therapy   Manual Therapy  Passive ROM    Manual therapy comments  Gron pain with hip and knree flex wotards stomach    Passive ROM  R hip, with some HS, and ITB stretching               PT Short Term Goals - 03/16/19 1110      PT SHORT TERM GOAL #1   Title  independent with initial HEP    Status  Achieved        PT Long Term Goals - 04/10/19 0929      PT LONG TERM GOAL #1   Title  increase lumbar ROM 25%    Status  On-going      PT LONG TERM GOAL #2  Title  understand posture and body mechanics    Status  Partially Met      PT LONG TERM GOAL #3   Title  increase the right hip strength to 4/5    Status  On-going      PT LONG TERM GOAL #4   Title  go up and down stairs without difficulty    Status  Partially Met            Plan - 04/10/19 0929    Clinical Impression Statement  Pt reports increase muscle stiffness upon entering clinic. She continues to have some R groin pain limiting his ROM with some activities. He did well with all machine level interventions. Leg press completed with limited ROM. R glute discomfort reported during eccentric phase of step ups. R hip tightness noted with MT.    Personal Factors and Comorbidities  Comorbidity 2    Comorbidities  Lumbar surgery, LBP, knee surgery    Examination-Activity Limitations  Sleep;Transfers;Stand;Locomotion Level;Bend;Bed Mobility;Lift;Stairs;Squat    Stability/Clinical Decision Making  Evolving/Moderate complexity    Rehab Potential  Good    PT Frequency  2x / week    PT Duration  8 weeks    PT Treatment/Interventions  ADLs/Self Care Home Management;Electrical Stimulation;Moist Heat;Therapeutic activities;Therapeutic  exercise;Neuromuscular re-education;Balance training;Manual techniques;Patient/family education    PT Next Visit Plan  will continue to try to advance him       Patient will benefit from skilled therapeutic intervention in order to improve the following deficits and impairments:  Abnormal gait, Improper body mechanics, Pain, Postural dysfunction, Increased muscle spasms, Decreased mobility, Decreased activity tolerance, Decreased endurance, Decreased range of motion, Decreased strength, Impaired flexibility, Difficulty walking, Decreased balance  Visit Diagnosis: Acute right-sided low back pain with right-sided sciatica  Muscle spasm of back  Difficulty in walking, not elsewhere classified  Muscle weakness (generalized)     Problem List Patient Active Problem List   Diagnosis Date Noted  . Lumbar radiculopathy 02/05/2019    [name], PTA 04/10/2019, 9:32 AM  [place] [address] [place], [place], [address] Phone: [phone]   Fax:  [phone]  Name: Yosgart Pavey MRN: [id number] Date of Birth: January 18, 1951

## 2019-04-11 ENCOUNTER — Ambulatory Visit: Payer: Medicare Other | Attending: Internal Medicine

## 2019-04-11 DIAGNOSIS — Z23 Encounter for immunization: Secondary | ICD-10-CM

## 2019-04-11 NOTE — Progress Notes (Signed)
   Covid-19 Vaccination Clinic  Name:  Sabrina Buxbaum    MRN: [id number] DOB: 1950/09/18  04/11/2019  Mr. Batt was observed post Covid-19 immunization for 15 minutes without incidence. He was provided with Vaccine Information Sheet and instruction to access the V-Safe system.   Mr. Toranzo was instructed to call 911 with any severe reactions post vaccine: Marland Kitchen Difficulty breathing  . Swelling of your face and throat  . A fast heartbeat  . A bad rash all over your body  . Dizziness and weakness    Immunizations Administered    Name Date Dose VIS Date Route   Pfizer COVID-19 Vaccine 04/11/2019  8:39 AM 0.3 mL 01/30/2019 Intramuscular   Manufacturer: [place]   Lot: [id number]   [place]: [id number]

## 2019-04-14 ENCOUNTER — Other Ambulatory Visit: Payer: Self-pay

## 2019-04-14 ENCOUNTER — Encounter: Payer: Self-pay | Admitting: Physical Therapy

## 2019-04-14 ENCOUNTER — Ambulatory Visit: Payer: Medicare Other | Admitting: Physical Therapy

## 2019-04-14 DIAGNOSIS — M62830 Muscle spasm of back: Secondary | ICD-10-CM | POA: Diagnosis not present

## 2019-04-14 DIAGNOSIS — M6281 Muscle weakness (generalized): Secondary | ICD-10-CM

## 2019-04-14 DIAGNOSIS — R262 Difficulty in walking, not elsewhere classified: Secondary | ICD-10-CM

## 2019-04-14 DIAGNOSIS — M5441 Lumbago with sciatica, right side: Secondary | ICD-10-CM

## 2019-04-14 NOTE — Therapy (Signed)
[place] [address] [place], [place], [address] Phone: [phone]   Fax:  [phone]  Physical Therapy Treatment  Patient Details  Name: Gerald Carpenter MRN: [id number] Date of Birth: Jun 20, 1950 Referring Provider (PT): Kathyrn Sheriff   Encounter Date: 04/14/2019  PT End of Session - 04/14/19 0820    Visit Number  14    Date for PT Re-Evaluation  05/07/19    PT Start Time  0745    PT Stop Time  0820    PT Time Calculation (min)  35 min    Activity Tolerance  Patient limited by pain    Behavior During Therapy  [facility] for tasks assessed/performed       History reviewed. No pertinent past medical history.  Past Surgical History:  Procedure Laterality Date  . ANTERIOR LAT LUMBAR FUSION Right 02/05/2019   Procedure: ATTEMPTED ANTERIOR LATERAL INTERBODY FUSION;  Surgeon: Consuella Lose, MD;  Location: [facility];  Service: Neurosurgery;  Laterality: Right;  RIGHT LATERAL TRANSPSOAS DISCECTOMY AND FUSION WITH ROBOTIC ASSISTED PEDICLE SCREW STABILIZATION, LUMBAR 4- LUMBAR 5  . COLONOSCOPY    . EYE SURGERY Bilateral 2013   lens implants for cataracts after Lasik  . KNEE ARTHROSCOPY Right 1980  . TONSILLECTOMY     at a very young age, but grew back    There were no vitals filed for this visit.  Subjective Assessment - 04/14/19 0747    Subjective  Pt reports walking up to a quarter mile Saturday getting Covid shot. Since then he has been experiencing increase R hip pain.    Currently in Pain?  Yes    Pain Score  6     Pain Location  Hip    Pain Orientation  Right                       [facility] Adult PT Treatment/Exercise - 04/14/19 0001      Lumbar Exercises: Aerobic   UBE (Upper Arm Bike)  R 3 2x min each standing       Lumbar Exercises: Machines for Strengthening   Cybex Lumbar Extension  Black band 2x15     Cybex Knee Extension  5# 3x10    Cybex Knee Flexion  20# 3x10    Other Lumbar Machine  Exercise  rows and lat bar 20# 2 sets 15    Other Lumbar Machine Exercise  15# chest press 2x10 , 25# triceps with core activation, 15# biceps      Lumbar Exercises: Standing   Heel Raises  20 reps;2 seconds    Shoulder Extension  Strengthening;10 reps;Both   x3   Shoulder Extension Limitations  10               PT Short Term Goals - 03/16/19 1110      PT SHORT TERM GOAL #1   Title  independent with initial HEP    Status  Achieved        PT Long Term Goals - 04/10/19 0929      PT LONG TERM GOAL #1   Title  increase lumbar ROM 25%    Status  On-going      PT LONG TERM GOAL #2   Title  understand posture and body mechanics    Status  Partially Met      PT LONG TERM GOAL #3   Title  increase the right hip strength to 4/5    Status  On-going  PT LONG TERM GOAL #4   Title  go up and down stairs without difficulty    Status  Partially Met            Plan - 04/14/19 0823    Clinical Impression Statement  Pt enters clinic reporting increase R hip pain. This hip pain started Saturday when he was walking trying to find the location to receive Covid shot. LE intervention in weight bearing was limited today. He did however tolerated some interventions with increase reps and or intensities. Antalgic gait throughout. Pt report that his recent hip incident has cause some R knee pain. He will try to go to the MD ASAP    Personal Factors and Comorbidities  Comorbidity 2    Comorbidities  Lumbar surgery, LBP, knee surgery    Examination-Activity Limitations  Sleep;Transfers;Stand;Locomotion Level;Bend;Bed Mobility;Lift;Stairs;Squat    Stability/Clinical Decision Making  Evolving/Moderate complexity    Rehab Potential  Good    PT Duration  8 weeks    PT Treatment/Interventions  ADLs/Self Care Home Management;Electrical Stimulation;Moist Heat;Therapeutic activities;Therapeutic exercise;Neuromuscular re-education;Balance training;Manual techniques;Patient/family education     PT Next Visit Plan  will continue to try to advance him       Patient will benefit from skilled therapeutic intervention in order to improve the following deficits and impairments:  Abnormal gait, Improper body mechanics, Pain, Postural dysfunction, Increased muscle spasms, Decreased mobility, Decreased activity tolerance, Decreased endurance, Decreased range of motion, Decreased strength, Impaired flexibility, Difficulty walking, Decreased balance  Visit Diagnosis: Acute right-sided low back pain with right-sided sciatica  Difficulty in walking, not elsewhere classified  Muscle weakness (generalized)  Muscle spasm of back     Problem List Patient Active Problem List   Diagnosis Date Noted  . Lumbar radiculopathy 02/05/2019    [name] 04/14/2019, 8:26 AM  [place] [address] [place], [place], [address] Phone: [phone]   Fax:  [phone]  Name: Satvik Parco MRN: [id number] Date of Birth: [birth year]

## 2019-04-17 ENCOUNTER — Ambulatory Visit: Payer: Medicare Other | Admitting: Physical Therapy

## 2019-04-17 ENCOUNTER — Encounter: Payer: Self-pay | Admitting: Physical Therapy

## 2019-04-17 ENCOUNTER — Other Ambulatory Visit: Payer: Self-pay

## 2019-04-17 DIAGNOSIS — M5441 Lumbago with sciatica, right side: Secondary | ICD-10-CM

## 2019-04-17 DIAGNOSIS — M62830 Muscle spasm of back: Secondary | ICD-10-CM

## 2019-04-17 DIAGNOSIS — R262 Difficulty in walking, not elsewhere classified: Secondary | ICD-10-CM

## 2019-04-17 DIAGNOSIS — M6281 Muscle weakness (generalized): Secondary | ICD-10-CM

## 2019-04-17 NOTE — Therapy (Signed)
[place], [place], [address] Phone: [phone]   Fax:  [phone]  Physical Therapy Treatment  Patient Details  Name: Sencere Symonette MRN: [id number] Date of Birth: 1950-10-07 Referring Provider (PT): Kathyrn Sheriff   Encounter Date: 04/17/2019  PT End of Session - 04/17/19 0834    Visit Number  15    Date for PT Re-Evaluation  05/07/19    PT Start Time  0745    PT Stop Time  0828    PT Time Calculation (min)  43 min    Activity Tolerance  Patient limited by pain    Behavior During Therapy  [facility] for tasks assessed/performed       History reviewed. No pertinent past medical history.  Past Surgical History:  Procedure Laterality Date  . ANTERIOR LAT LUMBAR FUSION Right 02/05/2019   Procedure: ATTEMPTED ANTERIOR LATERAL INTERBODY FUSION;  Surgeon: Consuella Lose, MD;  Location: [facility];  Service: Neurosurgery;  Laterality: Right;  RIGHT LATERAL TRANSPSOAS DISCECTOMY AND FUSION WITH ROBOTIC ASSISTED PEDICLE SCREW STABILIZATION, LUMBAR 4- LUMBAR 5  . COLONOSCOPY    . EYE SURGERY Bilateral 2013   lens implants for cataracts after Lasik  . KNEE ARTHROSCOPY Right 1980  . TONSILLECTOMY     at a very young age, but grew back    There were no vitals filed for this visit.  Subjective Assessment - 04/17/19 0749    Subjective  Patient continues to report significant pain in the right hip, unsure of why, he sees the surgeon on Monday, hoepfully will get x-rays of the hip to find out what is going on.    Currently in Pain?  Yes    Pain Score  6     Pain Location  Hip    Pain Orientation  Right    Aggravating Factors   walking                       [facility] Adult PT Treatment/Exercise - 04/17/19 0001      Lumbar Exercises: Aerobic   UBE (Upper Arm Bike)  R5 x 6 minutes      Lumbar Exercises: Machines for Strengthening   Cybex Knee Extension  5# 3x10    Cybex Knee Flexion  20# 3x10     Other Lumbar Machine Exercise  rows and lat bar 25# 2 sets 15    Other Lumbar Machine Exercise  15# chest press 2x10 , 25# triceps with core activation, 15# biceps      Lumbar Exercises: Standing   Heel Raises  20 reps;2 seconds      Lumbar Exercises: Seated   Other Seated Lumbar Exercises  isometric abs with ball in lap    Other Seated Lumbar Exercises  6# overhead press               PT Short Term Goals - 03/16/19 1110      PT SHORT TERM GOAL #1   Title  independent with initial HEP    Status  Achieved        PT Long Term Goals - 04/17/19 0837      PT LONG TERM GOAL #1   Title  increase lumbar ROM 25%    Status  On-going      PT LONG TERM GOAL #2   Title  understand posture and body mechanics    Status  Achieved      PT LONG TERM GOAL #  3   Title  increase the right hip strength to 4/5    Status  Partially Met      PT LONG TERM GOAL #4   Title  go up and down stairs without difficulty    Status  Partially Met            Plan - 04/17/19 0835    Clinical Impression Statement  Patient has an appointment with the neuro surgeon on Monday, he will return to [place] on Tuesday, we will then plan on what we need to do.  The right hip is our limiting factor.    PT Next Visit Plan  see what the outcome of the Md visit is on Monday and progress accordingly, may decreased to 1x/week until the hip is addressed    Consulted and Agree with Plan of Care  Patient       Patient will benefit from skilled therapeutic intervention in order to improve the following deficits and impairments:  Abnormal gait, Improper body mechanics, Pain, Postural dysfunction, Increased muscle spasms, Decreased mobility, Decreased activity tolerance, Decreased endurance, Decreased range of motion, Decreased strength, Impaired flexibility, Difficulty walking, Decreased balance  Visit Diagnosis: Acute right-sided low back pain with right-sided sciatica  Difficulty in walking, not elsewhere  classified  Muscle weakness (generalized)  Muscle spasm of back     Problem List Patient Active Problem List   Diagnosis Date Noted  . Lumbar radiculopathy 02/05/2019    Sumner Boast., PT 04/17/2019, 8:38 AM  [address] [place], [place], [address] Phone: [phone]   Fax:  [phone]  Name: Jayko Voorhees MRN: [id number] Date of Birth: 1951-01-14

## 2019-04-21 ENCOUNTER — Other Ambulatory Visit: Payer: Self-pay

## 2019-04-21 ENCOUNTER — Ambulatory Visit: Payer: Medicare Other | Attending: Neurosurgery | Admitting: Physical Therapy

## 2019-04-21 DIAGNOSIS — M6281 Muscle weakness (generalized): Secondary | ICD-10-CM

## 2019-04-21 DIAGNOSIS — R262 Difficulty in walking, not elsewhere classified: Secondary | ICD-10-CM | POA: Diagnosis present

## 2019-04-21 DIAGNOSIS — M62830 Muscle spasm of back: Secondary | ICD-10-CM | POA: Diagnosis present

## 2019-04-21 DIAGNOSIS — M5441 Lumbago with sciatica, right side: Secondary | ICD-10-CM

## 2019-04-21 NOTE — Therapy (Signed)
[place] [address] [place], [place], [address] Phone: [phone]   Fax:  [phone]  Physical Therapy Treatment  Patient Details  Name: Gerald Carpenter MRN: [id number] Date of Birth: 28-May-1950 Referring Provider (PT): Kathyrn Sheriff   Encounter Date: 04/21/2019  PT End of Session - 04/21/19 0813    Visit Number  16    Date for PT Re-Evaluation  05/07/19    PT Start Time  0745    PT Stop Time  0820    PT Time Calculation (min)  35 min       No past medical history on file.  Past Surgical History:  Procedure Laterality Date  . ANTERIOR LAT LUMBAR FUSION Right 02/05/2019   Procedure: ATTEMPTED ANTERIOR LATERAL INTERBODY FUSION;  Surgeon: Consuella Lose, MD;  Location: [facility];  Service: Neurosurgery;  Laterality: Right;  RIGHT LATERAL TRANSPSOAS DISCECTOMY AND FUSION WITH ROBOTIC ASSISTED PEDICLE SCREW STABILIZATION, LUMBAR 4- LUMBAR 5  . COLONOSCOPY    . EYE SURGERY Bilateral 2013   lens implants for cataracts after Lasik  . KNEE ARTHROSCOPY Right 1980  . TONSILLECTOMY     at a very young age, but grew back    There were no vitals filed for this visit.  Subjective Assessment - 04/21/19 0747    Subjective  "saw MD yesterday and they took xrays and now I wait for radiologist to read"  no chnages    Currently in Pain?  Yes    Pain Score  6     Pain Location  Hip    Pain Orientation  Right                       [facility] Adult PT Treatment/Exercise - 04/21/19 0001      Lumbar Exercises: Aerobic   UBE (Upper Arm Bike)  R5 x 6 minutes      Lumbar Exercises: Machines for Strengthening   Cybex Knee Extension  5# 3x10    Cybex Knee Flexion  20# 3x10    Other Lumbar Machine Exercise  rows and lat bar 25# 2 sets 15    Other Lumbar Machine Exercise  15# chest press 2x10 , 25# triceps with core activation, 15# biceps      Lumbar Exercises: Standing   Heel Raises  20 reps;2 seconds      Lumbar  Exercises: Seated   Other Seated Lumbar Exercises  isometric abs with ball in lap   20 3 sec hold   Other Seated Lumbar Exercises  6# overhead press   2 sets 10              PT Short Term Goals - 03/16/19 1110      PT SHORT TERM GOAL #1   Title  independent with initial HEP    Status  Achieved        PT Long Term Goals - 04/17/19 0837      PT LONG TERM GOAL #1   Title  increase lumbar ROM 25%    Status  On-going      PT LONG TERM GOAL #2   Title  understand posture and body mechanics    Status  Achieved      PT LONG TERM GOAL #3   Title  increase the right hip strength to 4/5    Status  Partially Met      PT LONG TERM GOAL #4   Title  go up and  down stairs without difficulty    Status  Partially Met            Plan - 04/21/19 0815    Clinical Impression Statement  conservative tx today to strengthen but not aggravate pain until gets xray results. avoided standing ex and mvmt that causes pain. no increase in symptoms today with tx, cues to kep core engaged with ex.    PT Treatment/Interventions  ADLs/Self Care Home Management;Electrical Stimulation;Moist Heat;Therapeutic activities;Therapeutic exercise;Neuromuscular re-education;Balance training;Manual techniques;Patient/family education    PT Next Visit Plan  check if he has heard xray results       Patient will benefit from skilled therapeutic intervention in order to improve the following deficits and impairments:  Abnormal gait, Improper body mechanics, Pain, Postural dysfunction, Increased muscle spasms, Decreased mobility, Decreased activity tolerance, Decreased endurance, Decreased range of motion, Decreased strength, Impaired flexibility, Difficulty walking, Decreased balance  Visit Diagnosis: Acute right-sided low back pain with right-sided sciatica  Difficulty in walking, not elsewhere classified  Muscle weakness (generalized)     Problem List Patient Active Problem List   Diagnosis Date  Noted  . Lumbar radiculopathy 02/05/2019    Syrena Burges,ANGIE PTA 04/21/2019, 8:18 AM  [place] [address] [place], [place], [address] Phone: [phone]   Fax:  [phone]  Name: Gerald Carpenter MRN: [id number] Date of Birth: 1950-02-23

## 2019-04-24 ENCOUNTER — Ambulatory Visit: Payer: Medicare Other | Admitting: Physical Therapy

## 2019-04-24 ENCOUNTER — Other Ambulatory Visit: Payer: Self-pay

## 2019-04-24 ENCOUNTER — Encounter: Payer: Self-pay | Admitting: Physical Therapy

## 2019-04-24 DIAGNOSIS — M6281 Muscle weakness (generalized): Secondary | ICD-10-CM

## 2019-04-24 DIAGNOSIS — M5441 Lumbago with sciatica, right side: Secondary | ICD-10-CM | POA: Diagnosis not present

## 2019-04-24 DIAGNOSIS — R262 Difficulty in walking, not elsewhere classified: Secondary | ICD-10-CM

## 2019-04-24 NOTE — Therapy (Signed)
[place], [place], [address] Phone: [phone]   Fax:  [phone]  Physical Therapy Treatment  Patient Details  Name: [name] MRN: [id number] Date of Birth: [birth year] Referring Provider (PT): Kathyrn Sheriff   Encounter Date: 04/24/2019  PT End of Session - 04/24/19 0824    Visit Number  17    Date for PT Re-Evaluation  05/07/19    PT Start Time  0744    PT Stop Time  0824    PT Time Calculation (min)  40 min    Activity Tolerance  Patient limited by pain    Behavior During Therapy  [facility] for tasks assessed/performed       History reviewed. No pertinent past medical history.  Past Surgical History:  Procedure Laterality Date  . ANTERIOR LAT LUMBAR FUSION Right 02/05/2019   Procedure: ATTEMPTED ANTERIOR LATERAL INTERBODY FUSION;  Surgeon: Consuella Lose, MD;  Location: [facility];  Service: Neurosurgery;  Laterality: Right;  RIGHT LATERAL TRANSPSOAS DISCECTOMY AND FUSION WITH ROBOTIC ASSISTED PEDICLE SCREW STABILIZATION, LUMBAR 4- LUMBAR 5  . COLONOSCOPY    . EYE SURGERY Bilateral 2013   lens implants for cataracts after Lasik  . KNEE ARTHROSCOPY Right 1980  . TONSILLECTOMY     at a very young age, but grew back    There were no vitals filed for this visit.  Subjective Assessment - 04/24/19 0748    Subjective  Pt reports that's x-rays last week revealed very sever arthritis in his R hip    Currently in Pain?  Yes    Pain Score  2     Pain Location  Hip    Pain Orientation  Right                       [facility] Adult PT Treatment/Exercise - 04/24/19 0001      High Level Balance   High Level Balance Comments  SLS RLE 3 x 5 seconds      Lumbar Exercises: Aerobic   UBE (Upper Arm Bike)  R5 x 6 minutes      Lumbar Exercises: Machines for Strengthening   Cybex Knee Extension  10lb 3x10    Cybex Knee Flexion  25lb 2x10     Other Lumbar Machine Exercise  rows and lat  bar 25# 2 sets 15    Other Lumbar Machine Exercise  15# chest press 2x10 , 35# triceps with core activation, 15# biceps      Lumbar Exercises: Standing   Heel Raises  2 seconds;15 reps   x2   Shoulder Extension  Strengthening;Both;20 reps    Shoulder Extension Limitations  10      Lumbar Exercises: Seated   Other Seated Lumbar Exercises  isometric abs with ball in lap   3 sec hold 2x`0   Other Seated Lumbar Exercises  4lb dunbells OHP 3x10                PT Short Term Goals - 03/16/19 1110      PT SHORT TERM GOAL #1   Title  independent with initial HEP    Status  Achieved        PT Long Term Goals - 04/17/19 0177      PT LONG TERM GOAL #1   Title  increase lumbar ROM 25%    Status  On-going      PT LONG TERM GOAL #2   Title  understand posture and body mechanics    Status  Achieved      PT LONG TERM GOAL #3   Title  increase the right hip strength to 4/5    Status  Partially Met      PT LONG TERM GOAL #4   Title  go up and down stairs without difficulty    Status  Partially Met            Plan - 04/24/19 0826    Clinical Impression Statement  X-rays revealed severe arthritis. He reports his back pain was a lot better. Cues for core engagement with seated rows and to keep scapulas retracted. He stated that he can tell his quad muscles is getting bigger. No pain reported with SLS, he was able to complete 5 seconds 3 times with SLS.    Comorbidities  Lumbar surgery, LBP, knee surgery    Examination-Activity Limitations  Sleep;Transfers;Stand;Locomotion Level;Bend;Bed Mobility;Lift;Stairs;Squat    Stability/Clinical Decision Making  Evolving/Moderate complexity    Rehab Potential  Good    PT Frequency  2x / week    PT Duration  8 weeks    PT Treatment/Interventions  ADLs/Self Care Home Management;Electrical Stimulation;Moist Heat;Therapeutic activities;Therapeutic exercise;Neuromuscular re-education;Balance training;Manual techniques;Patient/family  education    PT Next Visit Plan  Returns to MD next Wednesday       Patient will benefit from skilled therapeutic intervention in order to improve the following deficits and impairments:  Abnormal gait, Improper body mechanics, Pain, Postural dysfunction, Increased muscle spasms, Decreased mobility, Decreased activity tolerance, Decreased endurance, Decreased range of motion, Decreased strength, Impaired flexibility, Difficulty walking, Decreased balance  Visit Diagnosis: Muscle weakness (generalized)  Difficulty in walking, not elsewhere classified  Acute right-sided low back pain with right-sided sciatica     Problem List Patient Active Problem List   Diagnosis Date Noted  . Lumbar radiculopathy 02/05/2019    [name], PTA 04/24/2019, 8:29 AM  [place] [address] [place], [place], [address] Phone: [phone]   Fax:  [phone]  Name: Gerald Carpenter MRN: [id number] Date of Birth: [birth year]

## 2019-04-29 ENCOUNTER — Ambulatory Visit (INDEPENDENT_AMBULATORY_CARE_PROVIDER_SITE_OTHER): Payer: Medicare Other

## 2019-04-29 ENCOUNTER — Other Ambulatory Visit: Payer: Self-pay

## 2019-04-29 ENCOUNTER — Encounter: Payer: Self-pay | Admitting: Orthopaedic Surgery

## 2019-04-29 ENCOUNTER — Ambulatory Visit (INDEPENDENT_AMBULATORY_CARE_PROVIDER_SITE_OTHER): Payer: Medicare Other | Admitting: Orthopaedic Surgery

## 2019-04-29 VITALS — Ht 68.0 in | Wt 152.0 lb

## 2019-04-29 DIAGNOSIS — M87051 Idiopathic aseptic necrosis of right femur: Secondary | ICD-10-CM | POA: Diagnosis not present

## 2019-04-29 DIAGNOSIS — M1611 Unilateral primary osteoarthritis, right hip: Secondary | ICD-10-CM | POA: Insufficient documentation

## 2019-04-29 HISTORY — DX: Unilateral primary osteoarthritis, right hip: M16.11

## 2019-04-29 NOTE — Progress Notes (Signed)
Office Visit Note   Patient: Gerald Carpenter           Date of Birth: 02/18/1951           MRN: [id number] Visit Date: 04/29/2019              Requested by: No referring provider defined for this encounter. PCP: Patient, No Pcp Per   Assessment & Plan: Visit Diagnoses:  1. Avascular necrosis of bone of right hip (HCC)     Plan: Impression is right hip avascular necrosis with femoral head collapse.  X-rays were reviewed with the patient in detail and given the severe collapse of the femoral head and secondary degenerative changes recommendation has been for total hip replacement.  Risk benefits rehab recovery were all reviewed in detail today.  Patient was given the option of continued conservative treatment but he declined.  Questions encouraged and answered.  Follow-Up Instructions: Return for 2 week postop visit.   Orders:  Orders Placed This Encounter  Procedures  . XR HIP UNILAT W OR W/O PELVIS 2-3 VIEWS RIGHT   No orders of the defined types were placed in this encounter.     Procedures: No procedures performed   Clinical Data: No additional findings.   Subjective: Chief Complaint  Patient presents with  . Right Hip - Pain    Gerald Carpenter is a very pleasant 69 year old gentleman comes in for evaluation of severe chronic right hip pain.  He is 3 months status post lumbar spine fusion by Dr. Kathyrn Sheriff.  He states that he has severe chronic right hip and groin pain and he has a short right leg.  He used to take anti-inflammatories but they no longer provide him with any relief.  Denies any radiculopathy.   Review of Systems  Constitutional: Negative.   All other systems reviewed and are negative.    Objective: Vital Signs: Ht 5\' 8"  (1.727 m)   Wt 152 lb (68.9 kg)   BMI 23.11 kg/m   Physical Exam Vitals and nursing note reviewed.  Constitutional:      Appearance: He is well-developed.  HENT:     Head: Normocephalic and atraumatic.  Eyes:     Pupils:  Pupils are equal, round, and reactive to light.  Pulmonary:     Effort: Pulmonary effort is normal.  Abdominal:     Palpations: Abdomen is soft.  Musculoskeletal:        General: Normal range of motion.     Cervical back: Neck supple.  Skin:    General: Skin is warm.  Neurological:     Mental Status: He is alert and oriented to person, place, and time.  Psychiatric:        Behavior: Behavior normal.        Thought Content: Thought content normal.        Judgment: Judgment normal.     Ortho Exam Right hip exam shows limited internal and external rotation.  Positive FADIR.  Positive logroll.  Positive Stinchfield sign. Specialty Comments:  No specialty comments available.  Imaging: XR HIP UNILAT W OR W/O PELVIS 2-3 VIEWS RIGHT  Result Date: 04/29/2019 Collapse of femoral head and multiple subchondral cysts.  Secondary degenerative joint disease    PMFS History: Patient Active Problem List   Diagnosis Date Noted  . 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  . Lumbar radiculopathy 02/05/2019   No past medical history on file.  No family history on file.  Past Surgical History:  Procedure Laterality  Date  . ANTERIOR LAT LUMBAR FUSION Right 02/05/2019   Procedure: ATTEMPTED ANTERIOR LATERAL INTERBODY FUSION;  Surgeon: Consuella Lose, MD;  Location: [facility];  Service: Neurosurgery;  Laterality: Right;  RIGHT LATERAL TRANSPSOAS DISCECTOMY AND FUSION WITH ROBOTIC ASSISTED PEDICLE SCREW STABILIZATION, LUMBAR 4- LUMBAR 5  . COLONOSCOPY    . EYE SURGERY Bilateral 2013   lens implants for cataracts after Lasik  . KNEE ARTHROSCOPY Right 1980  . TONSILLECTOMY     at a very young age, but grew back   Social History   Occupational History  . Not on file  Tobacco Use  . Smoking status: Current Some Day Smoker    Packs/day: 0.50    Years: 60.00    Pack years: 30.00    Types: Cigarettes  . Smokeless tobacco: Never Used  Substance and Sexual Activity  . 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 Drug use: Not Currently    Comment: prior usage of "speed"  when drinking  . Sexual activity: Not on file

## 2019-05-05 ENCOUNTER — Ambulatory Visit: Payer: Medicare Other | Attending: Internal Medicine

## 2019-05-05 DIAGNOSIS — Z23 Encounter for immunization: Secondary | ICD-10-CM

## 2019-05-05 NOTE — Progress Notes (Signed)
   Covid-19 Vaccination Clinic  Name:  Gerald Carpenter    MRN: [id number] DOB: [birth year]  05/05/2019  Mr. Piercefield was observed post Covid-19 immunization for 15 minutes without incident. He was provided with Vaccine Information Sheet and instruction to access the V-Safe system.   Mr. Karlberg was instructed to call 911 with any severe reactions post vaccine: Marland Kitchen Difficulty breathing  . Swelling of face and throat  . A fast heartbeat  . A bad rash all over body  . Dizziness and weakness   Immunizations Administered    Name Date Dose VIS Date Route   Pfizer COVID-19 Vaccine 05/05/2019  9:18 AM 0.3 mL 01/30/2019 Intramuscular   Manufacturer: [place]   Lot: [id number]   [place]: [id number]

## 2019-05-06 ENCOUNTER — Other Ambulatory Visit: Payer: Self-pay

## 2019-05-08 ENCOUNTER — Other Ambulatory Visit: Payer: Self-pay

## 2019-05-08 ENCOUNTER — Encounter: Payer: Self-pay | Admitting: Physical Therapy

## 2019-05-08 ENCOUNTER — Ambulatory Visit: Payer: Medicare Other | Admitting: Physical Therapy

## 2019-05-08 DIAGNOSIS — R262 Difficulty in walking, not elsewhere classified: Secondary | ICD-10-CM

## 2019-05-08 DIAGNOSIS — M5441 Lumbago with sciatica, right side: Secondary | ICD-10-CM | POA: Diagnosis not present

## 2019-05-08 DIAGNOSIS — M62830 Muscle spasm of back: Secondary | ICD-10-CM

## 2019-05-08 DIAGNOSIS — M6281 Muscle weakness (generalized): Secondary | ICD-10-CM

## 2019-05-08 NOTE — Therapy (Signed)
Gerald Carpenter, [place], [address] Phone: [phone]   Fax:  [phone]  Physical Therapy Treatment  Patient Details  Name: Gerald Carpenter MRN: [id number] Date of Birth: August 03, 1950 Referring Provider (PT): Kathyrn Sheriff   Encounter Date: 05/08/2019  PT End of Session - 05/08/19 1049    Visit Number  18    Date for PT Re-Evaluation  05/07/19    PT Start Time  1010    PT Stop Time  1050    PT Time Calculation (min)  40 min    Activity Tolerance  Patient limited by pain;Patient tolerated treatment well    Behavior During Therapy  [facility] for tasks assessed/performed       History reviewed. No pertinent past medical history.  Past Surgical History:  Procedure Laterality Date  . ANTERIOR LAT LUMBAR FUSION Right 02/05/2019   Procedure: ATTEMPTED ANTERIOR LATERAL INTERBODY FUSION;  Surgeon: Consuella Lose, MD;  Location: [place];  Service: Neurosurgery;  Laterality: Right;  RIGHT LATERAL TRANSPSOAS DISCECTOMY AND FUSION WITH ROBOTIC ASSISTED PEDICLE SCREW STABILIZATION, LUMBAR 4- LUMBAR 5  . COLONOSCOPY    . EYE SURGERY Bilateral 2013   lens implants for cataracts after Lasik  . KNEE ARTHROSCOPY Right 1980  . TONSILLECTOMY     at a very young age, but grew back    There were no vitals filed for this visit.  Subjective Assessment - 05/08/19 1010    Subjective  "Good surgery all scheduled, so that is a good thing"    Currently in Pain?  Yes    Pain Score  9     Pain Location  Hip    Pain Orientation  Right                       [facility] Adult PT Treatment/Exercise - 05/08/19 0001      Exercises   Exercises  Knee/Hip      Lumbar Exercises: Aerobic   UBE (Upper Arm Bike)  R5 x 6 minutes      Lumbar Exercises: Machines for Strengthening   Cybex Knee Extension  5lb 2x15, RLE 5lb 2x10    Cybex Knee Flexion  25lb 2x10, RLE 15lb 2x10     Other Lumbar Machine Exercise  rows and lat bar  25# 2 sets 15    Other Lumbar Machine Exercise  15# chest press 2x15 , 35# triceps with core activation, 15# biceps      Lumbar Exercises: Standing   Heel Raises  2 seconds;15 reps    Shoulder Extension  Strengthening;Both;20 reps    Shoulder Extension Limitations  10      Knee/Hip Exercises: Standing   Other Standing Knee Exercises  RLE Ext x10      Knee/Hip Exercises: Seated   Ball Squeeze  2x10 3 sec hold     Abduction/Adduction   Both;2 sets;10 reps    Abd/Adduction Limitations  blue tband                PT Short Term Goals - 03/16/19 1110      PT SHORT TERM GOAL #1   Title  independent with initial HEP    Status  Achieved        PT Long Term Goals - 04/17/19 5974      PT LONG TERM GOAL #1   Title  increase lumbar ROM 25%    Status  On-going      PT  LONG TERM GOAL #2   Title  understand posture and body mechanics    Status  Achieved      PT LONG TERM GOAL #3   Title  increase the right hip strength to 4/5    Status  Partially Met      PT LONG TERM GOAL #4   Title  go up and down stairs without difficulty    Status  Partially Met            Plan - 05/08/19 1050    Clinical Impression Statement  Partial R hip replacement scheduled for April 5th. R hip pain with all wt bearing activities. R hip pain during the eccentric phase of seated hip abduction. Postural cues needed with seated rows and lats. No pain with standing hip extensions.    Personal Factors and Comorbidities  Comorbidity 2    Comorbidities  Lumbar surgery, LBP, knee surgery    Examination-Activity Limitations  Sleep;Transfers;Stand;Locomotion Level;Bend;Bed Mobility;Lift;Stairs;Squat    Stability/Clinical Decision Making  Evolving/Moderate complexity    Rehab Potential  Good    PT Frequency  2x / week    PT Duration  8 weeks    PT Treatment/Interventions  ADLs/Self Care Home Management;Electrical Stimulation;Moist Heat;Therapeutic activities;Therapeutic exercise;Neuromuscular  re-education;Balance training;Manual techniques;Patient/family education    PT Next Visit Plan  Pt reports thats MD wants him to continues therapy to [facility] RLE and back strenght.       Patient will benefit from skilled therapeutic intervention in order to improve the following deficits and impairments:  Abnormal gait, Improper body mechanics, Pain, Postural dysfunction, Increased muscle spasms, Decreased mobility, Decreased activity tolerance, Decreased endurance, Decreased range of motion, Decreased strength, Impaired flexibility, Difficulty walking, Decreased balance  Visit Diagnosis: Acute right-sided low back pain with right-sided sciatica  Muscle spasm of back  Difficulty in walking, not elsewhere classified  Muscle weakness (generalized)     Problem List Patient Active Problem List   Diagnosis Date Noted  . 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  . Lumbar radiculopathy 02/05/2019    [name], PTA 05/08/2019, 10:52 AM  [address] [place], [place], [address] Phone: [phone]   Fax:  [phone]  Name: Gerald Carpenter MRN: [id number] Date of Birth: [birth year]

## 2019-05-12 ENCOUNTER — Other Ambulatory Visit: Payer: Self-pay

## 2019-05-12 ENCOUNTER — Encounter: Payer: Self-pay | Admitting: Physical Therapy

## 2019-05-12 ENCOUNTER — Ambulatory Visit: Payer: Medicare Other | Admitting: Physical Therapy

## 2019-05-12 DIAGNOSIS — M5441 Lumbago with sciatica, right side: Secondary | ICD-10-CM | POA: Diagnosis not present

## 2019-05-12 DIAGNOSIS — M62830 Muscle spasm of back: Secondary | ICD-10-CM

## 2019-05-12 DIAGNOSIS — R262 Difficulty in walking, not elsewhere classified: Secondary | ICD-10-CM

## 2019-05-12 DIAGNOSIS — M6281 Muscle weakness (generalized): Secondary | ICD-10-CM

## 2019-05-12 NOTE — Addendum Note (Signed)
Addended by: Sumner Boast on: 05/12/2019 11:35 AM   Modules accepted: Orders

## 2019-05-12 NOTE — Therapy (Signed)
[place], [place], [address] Phone: [phone]   Fax:  [phone]  Physical Therapy Treatment  Patient Details  Name: Gerald Carpenter MRN: [id number] Date of Birth: Feb 18, 1951 Referring Provider (PT): Kathyrn Sheriff   Encounter Date: 05/12/2019  PT End of Session - 05/12/19 0833    Visit Number  19    Date for PT Re-Evaluation  05/07/19    PT Start Time  0752    PT Stop Time  0832    PT Time Calculation (min)  40 min    Activity Tolerance  Patient limited by pain;Patient tolerated treatment well    Behavior During Therapy  [facility] for tasks assessed/performed       History reviewed. No pertinent past medical history.  Past Surgical History:  Procedure Laterality Date  . ANTERIOR LAT LUMBAR FUSION Right 02/05/2019   Procedure: ATTEMPTED ANTERIOR LATERAL INTERBODY FUSION;  Surgeon: Consuella Lose, MD;  Location: [place];  Service: Neurosurgery;  Laterality: Right;  RIGHT LATERAL TRANSPSOAS DISCECTOMY AND FUSION WITH ROBOTIC ASSISTED PEDICLE SCREW STABILIZATION, LUMBAR 4- LUMBAR 5  . COLONOSCOPY    . EYE SURGERY Bilateral 2013   lens implants for cataracts after Lasik  . KNEE ARTHROSCOPY Right 1980  . TONSILLECTOMY     at a very young age, but grew back    There were no vitals filed for this visit.  Subjective Assessment - 05/12/19 0755    Subjective  "hurting" "The weekend was really bad, today is not as bad"    Currently in Pain?  Yes    Pain Score  7     Pain Location  Hip    Pain Orientation  Right                       [facility] Adult PT Treatment/Exercise - 05/12/19 0001      Lumbar Exercises: Aerobic   UBE (Upper Arm Bike)  R5 x 6 minutes      Lumbar Exercises: Machines for Strengthening   Cybex Knee Extension  10lb 2x15, RLE 5lb 2x10    Cybex Knee Flexion  25lb 2x15, RLE 15lb 2x10     Other Lumbar Machine Exercise  rows and lat bar 25# 2 sets 15    Other Lumbar  Machine Exercise  15# chest press 2x15 , 35# triceps with core activation, 15# biceps      Lumbar Exercises: Standing   Heel Raises  2 seconds;20 reps    Shoulder Extension  Strengthening;Both;20 reps    Shoulder Extension Limitations  15      Knee/Hip Exercises: Standing   Other Standing Knee Exercises  hip Ext 2x10 each       Knee/Hip Exercises: Seated   Ball Squeeze  2x10 3 sec hold     Abduction/Adduction   Both;2 sets;15 reps    Abd/Adduction Limitations  yellow tband               PT Short Term Goals - 03/16/19 1110      PT SHORT TERM GOAL #1   Title  independent with initial HEP    Status  Achieved        PT Long Term Goals - 04/17/19 9977      PT LONG TERM GOAL #1   Title  increase lumbar ROM 25%    Status  On-going      PT LONG TERM GOAL #2   Title  understand  posture and body mechanics    Status  Achieved      PT LONG TERM GOAL #3   Title  increase the right hip strength to 4/5    Status  Partially Met      PT LONG TERM GOAL #4   Title  go up and down stairs without difficulty    Status  Partially Met            Plan - 05/12/19 0833    Clinical Impression Statement  Pt continues to report increase R hip pain that's hakes it difficult to ambulate at times. Decrease resistance to hip abduction due to his subjective reports of increase pain over the weekend. This was a new interventions last week. increase resistance tolerated with shoulder extension weakness and difficulty with single leg extensions    Personal Factors and Comorbidities  Comorbidity 2    Comorbidities  Lumbar surgery, LBP, knee surgery    Examination-Activity Limitations  Sleep;Transfers;Stand;Locomotion Level;Bend;Bed Mobility;Lift;Stairs;Squat    Stability/Clinical Decision Making  Evolving/Moderate complexity    Rehab Potential  Good    PT Treatment/Interventions  ADLs/Self Care Home Management;Electrical Stimulation;Moist Heat;Therapeutic activities;Therapeutic  exercise;Neuromuscular re-education;Balance training;Manual techniques;Patient/family education    PT Next Visit Plan  Pt reports thats MD wants him to continues therapy to [facility] RLE and back strenght.       Patient will benefit from skilled therapeutic intervention in order to improve the following deficits and impairments:  Abnormal gait, Improper body mechanics, Pain, Postural dysfunction, Increased muscle spasms, Decreased mobility, Decreased activity tolerance, Decreased endurance, Decreased range of motion, Decreased strength, Impaired flexibility, Difficulty walking, Decreased balance  Visit Diagnosis: Difficulty in walking, not elsewhere classified  Muscle weakness (generalized)  Muscle spasm of back  Acute right-sided low back pain with right-sided sciatica     Problem List Patient Active Problem List   Diagnosis Date Noted  . 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  . Lumbar radiculopathy 02/05/2019    [name], PTA 05/12/2019, 8:35 AM  [address] [place], [place], [address] Phone: [phone]   Fax:  [phone]  Name: Gerald Carpenter MRN: [id number] Date of Birth: 08/23/1950

## 2019-05-15 ENCOUNTER — Other Ambulatory Visit: Payer: Self-pay

## 2019-05-15 ENCOUNTER — Ambulatory Visit: Payer: Medicare Other | Admitting: Physical Therapy

## 2019-05-15 ENCOUNTER — Encounter: Payer: Self-pay | Admitting: Physical Therapy

## 2019-05-15 DIAGNOSIS — M6281 Muscle weakness (generalized): Secondary | ICD-10-CM

## 2019-05-15 DIAGNOSIS — M5441 Lumbago with sciatica, right side: Secondary | ICD-10-CM

## 2019-05-15 DIAGNOSIS — M62830 Muscle spasm of back: Secondary | ICD-10-CM

## 2019-05-15 DIAGNOSIS — R262 Difficulty in walking, not elsewhere classified: Secondary | ICD-10-CM

## 2019-05-15 NOTE — Therapy (Signed)
[place], [place], [address] Phone: [phone]   Fax:  [phone] Progress Note Reporting Period 04/02/19 to 05/15/19 for visits 11-20 See note below for Objective Data and Assessment of Progress/Goals.      Physical Therapy Treatment  Patient Details  Name: Gerald Carpenter MRN: [id number] Date of Birth: 1950/12/30 Referring Provider (PT): Kathyrn Sheriff   Encounter Date: 05/15/2019  PT End of Session - 05/15/19 0908    Visit Number  20    Date for PT Re-Evaluation  06/08/19    PT Start Time  0827    PT Stop Time  0901    PT Time Calculation (min)  34 min    Activity Tolerance  Patient limited by pain;Patient tolerated treatment well    Behavior During Therapy  [facility] for tasks assessed/performed       History reviewed. No pertinent past medical history.  Past Surgical History:  Procedure Laterality Date  . ANTERIOR LAT LUMBAR FUSION Right 02/05/2019   Procedure: ATTEMPTED ANTERIOR LATERAL INTERBODY FUSION;  Surgeon: Consuella Lose, MD;  Location: [place];  Service: Neurosurgery;  Laterality: Right;  RIGHT LATERAL TRANSPSOAS DISCECTOMY AND FUSION WITH ROBOTIC ASSISTED PEDICLE SCREW STABILIZATION, LUMBAR 4- LUMBAR 5  . COLONOSCOPY    . EYE SURGERY Bilateral 2013   lens implants for cataracts after Lasik  . KNEE ARTHROSCOPY Right 1980  . TONSILLECTOMY     at a very young age, but grew back    There were no vitals filed for this visit.  Subjective Assessment - 05/15/19 0841    Subjective  20/10 hip pain yesterday today 10/10  R hip pain    Pain Score  10-Worst pain ever    Pain Location  Hip    Pain Orientation  Right                       [facility] Adult PT Treatment/Exercise - 05/15/19 0001      Lumbar Exercises: Aerobic   UBE (Upper Arm Bike)  R5 x 6 minutes      Lumbar Exercises: Machines for Strengthening   Cybex Knee Extension  5lb 2x15    Cybex Knee Flexion  20lb  2x15    Other Lumbar Machine Exercise  rows and lat bar 25# 2 sets 15    Other Lumbar Machine Exercise  20# chest press 2x15 , 35# triceps with core activation, 15# biceps               PT Short Term Goals - 03/16/19 1110      PT SHORT TERM GOAL #1   Title  independent with initial HEP    Status  Achieved        PT Long Term Goals - 04/17/19 0086      PT LONG TERM GOAL #1   Title  increase lumbar ROM 25%    Status  On-going      PT LONG TERM GOAL #2   Title  understand posture and body mechanics    Status  Achieved      PT LONG TERM GOAL #3   Title  increase the right hip strength to 4/5    Status  Partially Met      PT LONG TERM GOAL #4   Title  go up and down stairs without difficulty    Status  Partially Met            Plan -  05/15/19 0909    Clinical Impression Statement  Pt enters clinic reporting increase R hip pain. Ambulate din with a limp not wanting to put weight in RLE. The exercises he did complete he did well with good ROM before he had to stop due to pain.    Personal Factors and Comorbidities  Comorbidity 2    Comorbidities  Lumbar surgery, LBP, knee surgery    Examination-Activity Limitations  Sleep;Transfers;Stand;Locomotion Level;Bend;Bed Mobility;Lift;Stairs;Squat    Stability/Clinical Decision Making  Evolving/Moderate complexity    Rehab Potential  Good    PT Duration  8 weeks    PT Treatment/Interventions  ADLs/Self Care Home Management;Electrical Stimulation;Moist Heat;Therapeutic activities;Therapeutic exercise;Neuromuscular re-education;Balance training;Manual techniques;Patient/family education    PT Next Visit Plan  Pt reports that MD wants him to continues therapy to [facility] RLE and back strenght.       Patient will benefit from skilled therapeutic intervention in order to improve the following deficits and impairments:  Abnormal gait, Improper body mechanics, Pain, Postural dysfunction, Increased muscle spasms, Decreased  mobility, Decreased activity tolerance, Decreased endurance, Decreased range of motion, Decreased strength, Impaired flexibility, Difficulty walking, Decreased balance  Visit Diagnosis: Difficulty in walking, not elsewhere classified  Muscle spasm of back  Muscle weakness (generalized)  Acute right-sided low back pain with right-sided sciatica     Problem List Patient Active Problem List   Diagnosis Date Noted  . 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  . Lumbar radiculopathy 02/05/2019    [name], PTA 05/15/2019, 9:11 AM  [address] [place], [place], [address] Phone: [phone]   Fax:  [phone]  Name: Gerald Carpenter MRN: [id number] Date of Birth: 1950/10/11

## 2019-05-19 ENCOUNTER — Encounter: Payer: Self-pay | Admitting: Physical Therapy

## 2019-05-19 ENCOUNTER — Ambulatory Visit: Payer: Medicare Other | Admitting: Physical Therapy

## 2019-05-19 ENCOUNTER — Other Ambulatory Visit: Payer: Self-pay | Admitting: Physician Assistant

## 2019-05-19 ENCOUNTER — Other Ambulatory Visit: Payer: Self-pay

## 2019-05-19 DIAGNOSIS — M62830 Muscle spasm of back: Secondary | ICD-10-CM

## 2019-05-19 DIAGNOSIS — M6281 Muscle weakness (generalized): Secondary | ICD-10-CM

## 2019-05-19 DIAGNOSIS — M5441 Lumbago with sciatica, right side: Secondary | ICD-10-CM

## 2019-05-19 DIAGNOSIS — R262 Difficulty in walking, not elsewhere classified: Secondary | ICD-10-CM

## 2019-05-19 MED ORDER — DOCUSATE SODIUM 100 MG PO CAPS: 100.0000 mg | ORAL_CAPSULE | Freq: Every day | ORAL | 2 refills | Status: DC | PRN

## 2019-05-19 MED ORDER — ASPIRIN EC 81 MG PO TBEC: 81.0000 mg | DELAYED_RELEASE_TABLET | Freq: Two times a day (BID) | ORAL | 0 refills | Status: DC

## 2019-05-19 MED ORDER — ONDANSETRON HCL 4 MG PO TABS: 4.0000 mg | ORAL_TABLET | Freq: Three times a day (TID) | ORAL | 0 refills | Status: DC | PRN

## 2019-05-19 MED ORDER — OXYCODONE HCL ER 10 MG PO T12A: 10.0000 mg | EXTENDED_RELEASE_TABLET | Freq: Two times a day (BID) | ORAL | 0 refills | Status: DC

## 2019-05-19 MED ORDER — METHOCARBAMOL 500 MG PO TABS: 500.0000 mg | ORAL_TABLET | Freq: Two times a day (BID) | ORAL | 0 refills | Status: DC | PRN

## 2019-05-19 MED ORDER — OXYCODONE-ACETAMINOPHEN 5-325 MG PO TABS: 1.0000 | ORAL_TABLET | Freq: Four times a day (QID) | ORAL | 0 refills | Status: DC | PRN

## 2019-05-19 NOTE — Therapy (Signed)
[place], [place], [address] Phone: [phone]   Fax:  [phone]  Physical Therapy Treatment  Patient Details  Name: Gerald Carpenter MRN: [id number] Date of Birth: [birth year] Referring Provider (PT): Kathyrn Sheriff   Encounter Date: 05/19/2019  PT End of Session - 05/19/19 0822    Visit Number  21    PT Start Time  0742    PT Stop Time  0822    PT Time Calculation (min)  40 min    Activity Tolerance  Patient limited by pain;Patient tolerated treatment well    Behavior During Therapy  [facility] for tasks assessed/performed       History reviewed. No pertinent past medical history.  Past Surgical History:  Procedure Laterality Date  . ANTERIOR LAT LUMBAR FUSION Right 02/05/2019   Procedure: ATTEMPTED ANTERIOR LATERAL INTERBODY FUSION;  Surgeon: Consuella Lose, MD;  Location: [place];  Service: Neurosurgery;  Laterality: Right;  RIGHT LATERAL TRANSPSOAS DISCECTOMY AND FUSION WITH ROBOTIC ASSISTED PEDICLE SCREW STABILIZATION, LUMBAR 4- LUMBAR 5  . COLONOSCOPY    . EYE SURGERY Bilateral 2013   lens implants for cataracts after Lasik  . KNEE ARTHROSCOPY Right 1980  . TONSILLECTOMY     at a very young age, but grew back    There were no vitals filed for this visit.  Subjective Assessment - 05/19/19 0741    Subjective  PAtient continues to have severe hip pain, there is popping in the hip with every step.  He is planning on surgery next Monday.    Limitations  Walking    Currently in Pain?  Yes    Pain Score  10-Worst pain ever    Pain Location  Hip    Pain Orientation  Right    Aggravating Factors   walking, standing                       [facility] Adult PT Treatment/Exercise - 05/19/19 0001      Ambulation/Gait   Gait Comments  worked on gait with the patient using a FWW, had him demo, he did well, his THA is on Monday and he has 3 flights of stairs to the apartment, I took him to  the stairs and educated him on the use of hand rail and folded up walker and up with the good /down with the bad one at a time, he reports that he does not do it that way and he goes down with the good so he can keep his right leg straight I showed him that was not the case and he bent his leg and put more pressure, he was doubtful on doing this the correct way as he said he had been doing it for years his way      Lumbar Exercises: Aerobic   UBE (Upper Arm Bike)  R5 x 6 minutes      Lumbar Exercises: Machines for Strengthening   Other Lumbar Machine Exercise  rows and lat bar 25# 2 sets 15    Other Lumbar Machine Exercise  20# chest press 2x10      Lumbar Exercises: Seated   Other Seated Lumbar Exercises  isometric abs with ball in lap, slight bent over row and extension 4#, tried AR press with green tband but this he felt pain in the right hip, green tband tricpes , straight arm pulls with instruction for activation of the core    Other Seated  Lumbar Exercises  4lb dunbells OHP 3x10 , biceps               PT Short Term Goals - 03/16/19 1110      PT SHORT TERM GOAL #1   Title  independent with initial HEP    Status  Achieved        PT Long Term Goals - 05/19/19 7253      PT LONG TERM GOAL #1   Title  increase lumbar ROM 25%    Status  Partially Met      PT LONG TERM GOAL #2   Title  understand posture and body mechanics    Status  Achieved      PT LONG TERM GOAL #3   Title  increase the right hip strength to 4/5    Status  Partially Met      PT LONG TERM GOAL #4   Title  go up and down stairs without difficulty    Status  Partially Met      PT LONG TERM GOAL #5   Title  incresare SLR on the right to 70 degrees    Status  Not Met            Plan - 05/19/19 6644    Clinical Impression Statement  We will D/C patient today for the back , he will be having a right THA on Monday 05/25/19.  He was limited with back recovery due to the hip issues, he does report  feeling stronger and like he will tolerate the surgery better due to his incresaed strength.  He still reports pain in the right hip a 10/10.  I tried to ready him for the surgery by educating him on the use of the walker and how to do stairs    PT Next Visit Plan  D/C, will see him next week after THA    Consulted and Agree with Plan of Care  Patient       Patient will benefit from skilled therapeutic intervention in order to improve the following deficits and impairments:  Abnormal gait, Improper body mechanics, Pain, Postural dysfunction, Increased muscle spasms, Decreased mobility, Decreased activity tolerance, Decreased endurance, Decreased range of motion, Decreased strength, Impaired flexibility, Difficulty walking, Decreased balance  Visit Diagnosis: Difficulty in walking, not elsewhere classified  Muscle spasm of back  Muscle weakness (generalized)  Acute right-sided low back pain with right-sided sciatica     Problem List Patient Active Problem List   Diagnosis Date Noted  . 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  . Lumbar radiculopathy 02/05/2019    Sumner Boast., PT 05/19/2019, 8:25 AM  [place] [address] [place], [place], [address] Phone: [phone]   Fax:  [phone]  Name: Gerald Carpenter MRN: [id number] Date of Birth: December 16, 1950

## 2019-05-20 NOTE — Progress Notes (Addendum)
[place], [place]. [place]. Gerald Carpenter [address] Phone: [phone] Fax: [phone]      Your procedure is scheduled on Monday, April 5th.  Report to [facility] Main Entrance "A" at 5:30 A.M., and check in at the Admitting office.  Call this number if you have problems the morning of surgery:  [phone]  Call [phone] if you have any questions prior to your surgery date Monday-Friday 8am-4pm    Remember:  Do not eat after midnight the night before your surgery  You may drink clear liquids until 4:15 AM the morning of your surgery.   Clear liquids allowed are: Water, Non-Citrus Juices (without pulp), Carbonated Beverages, Clear Tea, Black Coffee Only, and Gatorade   Enhanced Recovery after Surgery for Orthopedics Enhanced Recovery after Surgery is a protocol used to improve the stress on your body and your recovery after surgery.  Patient Instructions  . The night before surgery:  o No food after midnight. ONLY clear liquids after midnight o Please finish Ensure drink by 4:15 AM  .  . The day of surgery (if you do NOT have diabetes):  o Drink ONE (1) Pre-Surgery Clear Ensure as directed.   o This drink was given to you during your hospital  pre-op appointment visit. o The pre-op nurse will instruct you on the time to drink the  Pre-Surgery Ensure depending on your surgery time. o Finish the drink at the designated time by the pre-op nurse.  o Nothing else to drink after completing the  Pre-Surgery Clear Ensure.         If you have questions, please contact your surgeon's office.     Take these medicines the morning of surgery with A SIP OF WATER   Colace  Zofran - if needed  Oxycodone  Oxycodone-Acetaminophen - if needed  As of today, STOP taking any Aspirin (unless otherwise instructed by your surgeon) and Aspirin containing products, Aleve, Naproxen, Ibuprofen, Motrin, Advil, Goody's, BC's, all  herbal medications, fish oil, and all vitamins.                      Do not wear jewelry            Do not wear lotions, powders, colognes, or deodorant.            Men may shave face and neck.            Do not bring valuables to the hospital.            [facility] is not responsible for any belongings or valuables.  Do NOT Smoke (Tobacco/Vapping) or drink Alcohol 24 hours prior to your procedure If you use a CPAP at night, you may bring all equipment for your overnight stay.   Contacts, glasses, dentures or bridgework may not be worn into surgery.      For patients admitted to the hospital, discharge time will be determined by your treatment team.   Patients discharged the day of surgery will not be allowed to drive home, and someone needs to stay with them for 24 hours.    Special instructions:   [place]- Preparing For Surgery  Before surgery, you can play an important role. Because skin is not sterile, your skin needs to be as free of germs as possible. You can reduce the number of germs on your skin by washing with CHG (chlorahexidine gluconate) Soap before surgery.  CHG is an antiseptic cleaner which kills germs and bonds with the skin to continue killing germs even after washing.    Oral Hygiene is also important to reduce your risk of infection.  Remember - BRUSH YOUR TEETH THE MORNING OF SURGERY WITH YOUR REGULAR TOOTHPASTE  Please do not use if you have an allergy to CHG or antibacterial soaps. If your skin becomes reddened/irritated stop using the CHG.  Do not shave (including legs and underarms) for at least 48 hours prior to first CHG shower. It is OK to shave your face.  Please follow these instructions carefully.   1. Shower the NIGHT BEFORE SURGERY and the MORNING OF SURGERY with CHG Soap.   2. If you chose to wash your hair, wash your hair first as usual with your normal shampoo.  3. After you shampoo, rinse your hair and body thoroughly to remove the  shampoo.  4. Use CHG as you would any other liquid soap. You can apply CHG directly to the skin and wash gently with a scrungie or a clean washcloth.   5. Apply the CHG Soap to your body ONLY FROM THE NECK DOWN.  Do not use on open wounds or open sores. Avoid contact with your eyes, ears, mouth and genitals (private parts). Wash Face and genitals (private parts)  with your normal soap.   6. Wash thoroughly, paying special attention to the area where your surgery will be performed.  7. Thoroughly rinse your body with warm water from the neck down.  8. DO NOT shower/wash with your normal soap after using and rinsing off the CHG Soap.  9. Pat yourself dry with a CLEAN TOWEL.  10. Wear CLEAN PAJAMAS to bed the night before surgery, wear comfortable clothes the morning of surgery  11. Place CLEAN SHEETS on your bed the night of your first shower and DO NOT SLEEP WITH PETS.   Day of Surgery:   Do not apply any deodorants/lotions.  Please wear clean clothes to the hospital/surgery center.   Remember to brush your teeth WITH YOUR REGULAR TOOTHPASTE.   Please read over the following fact sheets that you were given.

## 2019-05-21 ENCOUNTER — Other Ambulatory Visit (HOSPITAL_COMMUNITY)
Admission: RE | Admit: 2019-05-21 | Discharge: 2019-05-21 | Disposition: A | Discharge status: Home / Self Care | Payer: Medicare Other | Source: Ambulatory Visit | Attending: Orthopaedic Surgery | Admitting: Orthopaedic Surgery

## 2019-05-21 ENCOUNTER — Ambulatory Visit (HOSPITAL_COMMUNITY)
Admission: RE | Admit: 2019-05-21 | Discharge: 2019-05-21 | Disposition: A | Discharge status: Home / Self Care | Payer: Medicare Other | Source: Ambulatory Visit | Attending: Physician Assistant | Admitting: Physician Assistant

## 2019-05-21 ENCOUNTER — Other Ambulatory Visit: Payer: Self-pay

## 2019-05-21 ENCOUNTER — Encounter: Payer: Medicare Other | Admitting: Physical Therapy

## 2019-05-21 ENCOUNTER — Encounter (HOSPITAL_COMMUNITY)
Admission: RE | Admit: 2019-05-21 | Discharge: 2019-05-21 | Disposition: A | Discharge status: Home / Self Care | Payer: Medicare Other | Source: Ambulatory Visit | Attending: Orthopaedic Surgery | Admitting: Orthopaedic Surgery

## 2019-05-21 ENCOUNTER — Encounter (HOSPITAL_COMMUNITY): Payer: Self-pay

## 2019-05-21 DIAGNOSIS — M1611 Unilateral primary osteoarthritis, right hip: Secondary | ICD-10-CM | POA: Diagnosis present

## 2019-05-21 DIAGNOSIS — Z20822 Contact with and (suspected) exposure to covid-19: Secondary | ICD-10-CM | POA: Insufficient documentation

## 2019-05-21 DIAGNOSIS — Z01818 Encounter for other preprocedural examination: Secondary | ICD-10-CM | POA: Diagnosis not present

## 2019-05-21 HISTORY — DX: Unspecified nephritic syndrome with unspecified morphologic changes: N05.9

## 2019-05-21 HISTORY — DX: Unspecified osteoarthritis, unspecified site: M19.90

## 2019-05-21 LAB — URINALYSIS, ROUTINE W REFLEX MICROSCOPIC
Bilirubin Urine: NEGATIVE
Glucose, UA: NEGATIVE mg/dL
Hgb urine dipstick: NEGATIVE
Ketones, ur: NEGATIVE mg/dL
Leukocytes,Ua: NEGATIVE
Nitrite: NEGATIVE
Protein, ur: NEGATIVE mg/dL
Specific Gravity, Urine: 1.004 — ABNORMAL LOW (ref 1.005–1.030)
pH: 6 (ref 5.0–8.0)

## 2019-05-21 LAB — CBC WITH DIFFERENTIAL/PLATELET
Abs Immature Granulocytes: 0.05 10*3/uL (ref 0.00–0.07)
Basophils Absolute: 0.3 10*3/uL — ABNORMAL HIGH (ref 0.0–0.1)
Basophils Relative: 2 %
Eosinophils Absolute: 1.4 10*3/uL — ABNORMAL HIGH (ref 0.0–0.5)
Eosinophils Relative: 8 %
HCT: 52.8 % — ABNORMAL HIGH (ref 39.0–52.0)
Hemoglobin: 17 g/dL (ref 13.0–17.0)
Immature Granulocytes: 0 %
Lymphocytes Relative: 18 %
Lymphs Abs: 3 10*3/uL (ref 0.7–4.0)
MCH: 30.9 pg (ref 26.0–34.0)
MCHC: 32.2 g/dL (ref 30.0–36.0)
MCV: 95.8 fL (ref 80.0–100.0)
Monocytes Absolute: 1.6 10*3/uL — ABNORMAL HIGH (ref 0.1–1.0)
Monocytes Relative: 10 %
Neutro Abs: 10.2 10*3/uL — ABNORMAL HIGH (ref 1.7–7.7)
Neutrophils Relative %: 62 %
Platelets: 224 10*3/uL (ref 150–400)
RBC: 5.51 MIL/uL (ref 4.22–5.81)
RDW: 14.6 % (ref 11.5–15.5)
WBC: 16.5 10*3/uL — ABNORMAL HIGH (ref 4.0–10.5)
nRBC: 0 % (ref 0.0–0.2)

## 2019-05-21 LAB — COMPREHENSIVE METABOLIC PANEL
ALT: 20 U/L (ref 0–44)
AST: 17 U/L (ref 15–41)
Albumin: 4.2 g/dL (ref 3.5–5.0)
Alkaline Phosphatase: 62 U/L (ref 38–126)
Anion gap: 11 (ref 5–15)
BUN: 12 mg/dL (ref 8–23)
CO2: 23 mmol/L (ref 22–32)
Calcium: 9.1 mg/dL (ref 8.9–10.3)
Chloride: 105 mmol/L (ref 98–111)
Creatinine, Ser: 1.28 mg/dL — ABNORMAL HIGH (ref 0.61–1.24)
GFR calc Af Amer: >60 mL/min (ref >60)
GFR calc non Af Amer: 58 mL/min — ABNORMAL LOW (ref >60)
Glucose, Bld: 91 mg/dL (ref 70–99)
Potassium: 4.1 mmol/L (ref 3.5–5.1)
Sodium: 139 mmol/L (ref 135–145)
Total Bilirubin: 0.8 mg/dL (ref 0.3–1.2)
Total Protein: 7.7 g/dL (ref 6.5–8.1)

## 2019-05-21 LAB — TYPE AND SCREEN
ABO/RH(D): A POS
Antibody Screen: NEGATIVE

## 2019-05-21 LAB — APTT: aPTT: 30 seconds (ref 24–36)

## 2019-05-21 LAB — SARS CORONAVIRUS 2 (TAT 6-24 HRS): SARS Coronavirus 2: NEGATIVE

## 2019-05-21 LAB — PROTIME-INR
INR: 1 (ref 0.8–1.2)
Prothrombin Time: 13.1 seconds (ref 11.4–15.2)

## 2019-05-21 LAB — SURGICAL PCR SCREEN
MRSA, PCR: NEGATIVE
Staphylococcus aureus: NEGATIVE

## 2019-05-21 NOTE — Progress Notes (Signed)
PCP - none-pt. Just moved here from [place] Cardiologist - na   Chest x-ray - 05/21/19 EKG - 05/21/19 Stress Test - na ECHO - na Cardiac Cath - na  Sleep Study - na CPAP -     Blood Thinner Instructions: Aspirin Instructions:  Stopped 05/14/19  ERAS Protcol -yes PRE-SURGERY Ensure given  COVID TEST- 05/21/19   Anesthesia review:   Patient denies shortness of breath, fever, cough and chest pain at PAT appointment   All instructions explained to the patient, with a verbal understanding of the material. Patient agrees to go over the instructions while at home for a better understanding. Patient also instructed to self quarantine after being tested for COVID-19. The opportunity to ask questions was provided.

## 2019-05-22 MED ORDER — TRANEXAMIC ACID 1000 MG/10ML IV SOLN
2000.0000 mg | INTRAVENOUS | Status: AC
  Administered 2019-05-25: 09:00:00 2000 mg via TOPICAL
  Filled 2019-05-22: qty 20

## 2019-05-22 MED ORDER — BUPIVACAINE LIPOSOME 1.3 % IJ SUSP
10.0000 mL | INTRAMUSCULAR | Status: DC
  Filled 2019-05-22: qty 10

## 2019-05-24 NOTE — Anesthesia Preprocedure Evaluation (Addendum)
Anesthesia Evaluation  Patient identified by MRN, date of birth, ID band Patient awake    Reviewed: Allergy & Precautions, NPO status , Patient's Chart, lab work & pertinent test results  Airway Mallampati: I  TM Distance: >3 FB Neck ROM: Full    Dental  (+) Edentulous Upper, Edentulous Lower   Pulmonary Current Smoker and Patient abstained from smoking.,  30 pack year history, still smoking about 15cigg/d   Pulmonary exam normal        Cardiovascular negative cardio ROS   Rhythm:Regular Rate:Normal     Neuro/Psych negative neurological ROS  negative psych ROS   GI/Hepatic negative GI ROS, Neg liver ROS,   Endo/Other  negative endocrine ROS  Renal/GU Renal InsufficiencyRenal diseaseCr 1.3  negative genitourinary   Musculoskeletal  (+) Arthritis , Osteoarthritis,  Right hip degenerative joint disease Lumbar surgery Dec 2019 L4-5   Abdominal Normal abdominal exam  (+)   Peds  Hematology negative hematology ROS (+)   Anesthesia Other Findings   Reproductive/Obstetrics negative OB ROS                            Anesthesia Physical Anesthesia Plan  ASA: III  Anesthesia Plan: Spinal   Post-op Pain Management:    Induction:   PONV Risk Score and Plan: 2 and Propofol infusion and TIVA  Airway Management Planned: Natural Airway and Nasal Cannula  Additional Equipment: None  Intra-op Plan:   Post-operative Plan:   Informed Consent: I have reviewed the patients History and Physical, chart, labs and discussed the procedure including the risks, benefits and alternatives for the proposed anesthesia with the patient or authorized representative who has indicated his/her understanding and acceptance.       Plan Discussed with: CRNA  Anesthesia Plan Comments:        Anesthesia Quick Evaluation

## 2019-05-25 ENCOUNTER — Ambulatory Visit (HOSPITAL_COMMUNITY): Payer: Medicare Other | Admitting: Physician Assistant

## 2019-05-25 ENCOUNTER — Observation Stay (HOSPITAL_COMMUNITY)
Admission: RE | Admit: 2019-05-25 | Discharge: 2019-05-26 | Disposition: A | Discharge status: Home / Self Care | Payer: Medicare Other | Attending: Orthopaedic Surgery | Admitting: Orthopaedic Surgery

## 2019-05-25 ENCOUNTER — Encounter (HOSPITAL_COMMUNITY)
Admission: RE | Disposition: A | Discharge status: Home / Self Care | Payer: Self-pay | Source: Home / Self Care | Attending: Orthopaedic Surgery

## 2019-05-25 ENCOUNTER — Observation Stay (HOSPITAL_COMMUNITY): Payer: Medicare Other

## 2019-05-25 ENCOUNTER — Ambulatory Visit (HOSPITAL_COMMUNITY): Payer: Medicare Other

## 2019-05-25 ENCOUNTER — Ambulatory Visit (HOSPITAL_COMMUNITY): Payer: Medicare Other | Admitting: Anesthesiology

## 2019-05-25 ENCOUNTER — Encounter (HOSPITAL_COMMUNITY): Payer: Self-pay | Admitting: Orthopaedic Surgery

## 2019-05-25 ENCOUNTER — Other Ambulatory Visit: Payer: Self-pay

## 2019-05-25 DIAGNOSIS — F17210 Nicotine dependence, cigarettes, uncomplicated: Secondary | ICD-10-CM | POA: Insufficient documentation

## 2019-05-25 DIAGNOSIS — Z96649 Presence of unspecified artificial hip joint: Secondary | ICD-10-CM

## 2019-05-25 DIAGNOSIS — Z7982 Long term (current) use of aspirin: Secondary | ICD-10-CM | POA: Diagnosis not present

## 2019-05-25 DIAGNOSIS — Z419 Encounter for procedure for purposes other than remedying health state, unspecified: Secondary | ICD-10-CM

## 2019-05-25 DIAGNOSIS — M1611 Unilateral primary osteoarthritis, right hip: Secondary | ICD-10-CM | POA: Diagnosis not present

## 2019-05-25 DIAGNOSIS — Z96641 Presence of right artificial hip joint: Secondary | ICD-10-CM

## 2019-05-25 DIAGNOSIS — D62 Acute posthemorrhagic anemia: Secondary | ICD-10-CM | POA: Insufficient documentation

## 2019-05-25 DIAGNOSIS — Z79899 Other long term (current) drug therapy: Secondary | ICD-10-CM | POA: Diagnosis not present

## 2019-05-25 HISTORY — PX: TOTAL HIP ARTHROPLASTY: SHX124

## 2019-05-25 SURGERY — ARTHROPLASTY, HIP, TOTAL, ANTERIOR APPROACH
Anesthesia: Spinal | Site: Hip | Laterality: Right

## 2019-05-25 MED ORDER — ACETAMINOPHEN 325 MG PO TABS: 325.0000 mg | ORAL_TABLET | Freq: Four times a day (QID) | ORAL | Status: DC | PRN

## 2019-05-25 MED ORDER — 0.9 % SODIUM CHLORIDE (POUR BTL) OPTIME
TOPICAL | Status: DC | PRN
  Administered 2019-05-25: 08:00:00 1000 mL

## 2019-05-25 MED ORDER — LACTATED RINGERS IV SOLN: INTRAVENOUS | Status: DC

## 2019-05-25 MED ORDER — PROMETHAZINE HCL 25 MG/ML IJ SOLN: 6.2500 mg | INTRAMUSCULAR | Status: DC | PRN

## 2019-05-25 MED ORDER — HYDROMORPHONE HCL 1 MG/ML IJ SOLN: 0.5000 mg | INTRAMUSCULAR | Status: DC | PRN

## 2019-05-25 MED ORDER — ONDANSETRON HCL 4 MG/2ML IJ SOLN
INTRAMUSCULAR | Status: DC | PRN
  Administered 2019-05-25: 4 mg via INTRAVENOUS

## 2019-05-25 MED ORDER — FENTANYL CITRATE (PF) 100 MCG/2ML IJ SOLN
INTRAMUSCULAR | Status: DC | PRN
  Administered 2019-05-25: 50 ug via INTRAVENOUS

## 2019-05-25 MED ORDER — CEFAZOLIN SODIUM-DEXTROSE 2-4 GM/100ML-% IV SOLN
2.0000 g | INTRAVENOUS | Status: AC
  Administered 2019-05-25: 2 g via INTRAVENOUS
  Filled 2019-05-25: qty 100

## 2019-05-25 MED ORDER — OXYCODONE HCL 5 MG PO TABS: 10.0000 mg | ORAL_TABLET | ORAL | Status: DC | PRN

## 2019-05-25 MED ORDER — DOCUSATE SODIUM 100 MG PO CAPS
100.0000 mg | ORAL_CAPSULE | Freq: Two times a day (BID) | ORAL | Status: DC
  Administered 2019-05-25 – 2019-05-26 (×3): 100 mg via ORAL
  Filled 2019-05-25 (×3): qty 1

## 2019-05-25 MED ORDER — OXYCODONE HCL 5 MG PO TABS: 5.0000 mg | ORAL_TABLET | Freq: Once | ORAL | Status: DC | PRN

## 2019-05-25 MED ORDER — PHENYLEPHRINE HCL-NACL 10-0.9 MG/250ML-% IV SOLN
INTRAVENOUS | Status: DC | PRN
  Administered 2019-05-25: 30 ug/min via INTRAVENOUS

## 2019-05-25 MED ORDER — ONDANSETRON HCL 4 MG PO TABS: 4.0000 mg | ORAL_TABLET | Freq: Four times a day (QID) | ORAL | Status: DC | PRN

## 2019-05-25 MED ORDER — ACETAMINOPHEN 500 MG PO TABS
1000.0000 mg | ORAL_TABLET | Freq: Four times a day (QID) | ORAL | Status: AC
  Administered 2019-05-25 – 2019-05-26 (×3): 1000 mg via ORAL
  Filled 2019-05-25 (×4): qty 2

## 2019-05-25 MED ORDER — HYDROMORPHONE HCL 1 MG/ML IJ SOLN
INTRAMUSCULAR | Status: AC
  Filled 2019-05-25: qty 1

## 2019-05-25 MED ORDER — METOCLOPRAMIDE HCL 5 MG PO TABS: 5.0000 mg | ORAL_TABLET | Freq: Three times a day (TID) | ORAL | Status: DC | PRN

## 2019-05-25 MED ORDER — METHOCARBAMOL 1000 MG/10ML IJ SOLN
500.0000 mg | Freq: Four times a day (QID) | INTRAVENOUS | Status: DC | PRN
  Filled 2019-05-25: qty 5

## 2019-05-25 MED ORDER — KETOROLAC TROMETHAMINE 15 MG/ML IJ SOLN
15.0000 mg | Freq: Four times a day (QID) | INTRAMUSCULAR | Status: AC
  Administered 2019-05-25 – 2019-05-26 (×4): 15 mg via INTRAVENOUS
  Filled 2019-05-25 (×4): qty 1

## 2019-05-25 MED ORDER — SODIUM CHLORIDE 0.9% FLUSH
INTRAVENOUS | Status: DC | PRN
  Administered 2019-05-25: 20 mL

## 2019-05-25 MED ORDER — BUPIVACAINE HCL (PF) 0.25 % IJ SOLN
INTRAMUSCULAR | Status: AC
  Filled 2019-05-25: qty 30

## 2019-05-25 MED ORDER — MAGNESIUM CITRATE PO SOLN: 1.0000 | Freq: Once | ORAL | Status: DC | PRN

## 2019-05-25 MED ORDER — EPINEPHRINE PF 1 MG/ML IJ SOLN
INTRAMUSCULAR | Status: AC
  Filled 2019-05-25: qty 1

## 2019-05-25 MED ORDER — BUPIVACAINE IN DEXTROSE 0.75-8.25 % IT SOLN
INTRATHECAL | Status: DC | PRN
  Administered 2019-05-25: 1.7 mL via INTRATHECAL

## 2019-05-25 MED ORDER — DEXAMETHASONE SODIUM PHOSPHATE 10 MG/ML IJ SOLN: 10.0000 mg | Freq: Once | INTRAMUSCULAR | Status: DC

## 2019-05-25 MED ORDER — ONDANSETRON HCL 4 MG/2ML IJ SOLN: 4.0000 mg | Freq: Four times a day (QID) | INTRAMUSCULAR | Status: DC | PRN

## 2019-05-25 MED ORDER — ALUM & MAG HYDROXIDE-SIMETH 200-200-20 MG/5ML PO SUSP: 30.0000 mL | ORAL | Status: DC | PRN

## 2019-05-25 MED ORDER — PROPOFOL 500 MG/50ML IV EMUL
INTRAVENOUS | Status: DC | PRN
  Administered 2019-05-25: 100 ug/kg/min via INTRAVENOUS

## 2019-05-25 MED ORDER — GABAPENTIN 300 MG PO CAPS
300.0000 mg | ORAL_CAPSULE | Freq: Three times a day (TID) | ORAL | Status: DC
  Administered 2019-05-25 – 2019-05-26 (×4): 300 mg via ORAL
  Filled 2019-05-25 (×4): qty 1

## 2019-05-25 MED ORDER — MIDAZOLAM HCL 2 MG/2ML IJ SOLN
INTRAMUSCULAR | Status: AC
  Filled 2019-05-25: qty 2

## 2019-05-25 MED ORDER — HYDROMORPHONE HCL 1 MG/ML IJ SOLN
0.2500 mg | INTRAMUSCULAR | Status: DC | PRN
  Administered 2019-05-25 (×2): 0.25 mg via INTRAVENOUS

## 2019-05-25 MED ORDER — VANCOMYCIN HCL 1000 MG IV SOLR
INTRAVENOUS | Status: AC
  Filled 2019-05-25: qty 1000

## 2019-05-25 MED ORDER — DIPHENHYDRAMINE HCL 12.5 MG/5ML PO ELIX
25.0000 mg | ORAL_SOLUTION | ORAL | Status: DC | PRN
  Filled 2019-05-25: qty 10

## 2019-05-25 MED ORDER — POLYETHYLENE GLYCOL 3350 17 G PO PACK: 17.0000 g | PACK | Freq: Every day | ORAL | Status: DC | PRN

## 2019-05-25 MED ORDER — METHOCARBAMOL 750 MG PO TABS
750.0000 mg | ORAL_TABLET | Freq: Four times a day (QID) | ORAL | Status: DC | PRN
  Administered 2019-05-25: 750 mg via ORAL
  Filled 2019-05-25: qty 1

## 2019-05-25 MED ORDER — SODIUM CHLORIDE 0.9 % IR SOLN
Status: DC | PRN
  Administered 2019-05-25: 3000 mL

## 2019-05-25 MED ORDER — OXYCODONE HCL 5 MG PO TABS: 5.0000 mg | ORAL_TABLET | ORAL | Status: DC | PRN

## 2019-05-25 MED ORDER — ACETAMINOPHEN 500 MG PO TABS
1000.0000 mg | ORAL_TABLET | Freq: Once | ORAL | Status: AC
  Administered 2019-05-25: 1000 mg via ORAL
  Filled 2019-05-25: qty 2

## 2019-05-25 MED ORDER — SODIUM CHLORIDE 0.9 % IV SOLN: INTRAVENOUS | Status: DC | PRN

## 2019-05-25 MED ORDER — PROPOFOL 10 MG/ML IV BOLUS
INTRAVENOUS | Status: AC
  Filled 2019-05-25: qty 20

## 2019-05-25 MED ORDER — PHENOL 1.4 % MT LIQD: 1.0000 | OROMUCOSAL | Status: DC | PRN

## 2019-05-25 MED ORDER — CEFAZOLIN SODIUM-DEXTROSE 2-4 GM/100ML-% IV SOLN
2.0000 g | Freq: Four times a day (QID) | INTRAVENOUS | Status: AC
  Administered 2019-05-25 – 2019-05-26 (×3): 2 g via INTRAVENOUS
  Filled 2019-05-25 (×3): qty 100

## 2019-05-25 MED ORDER — TRANEXAMIC ACID-NACL 1000-0.7 MG/100ML-% IV SOLN
1000.0000 mg | INTRAVENOUS | Status: AC
  Administered 2019-05-25: 1000 mg via INTRAVENOUS
  Filled 2019-05-25: qty 100

## 2019-05-25 MED ORDER — OXYCODONE HCL 5 MG/5ML PO SOLN: 5.0000 mg | Freq: Once | ORAL | Status: DC | PRN

## 2019-05-25 MED ORDER — BUPIVACAINE-EPINEPHRINE 0.25% -1:200000 IJ SOLN
INTRAMUSCULAR | Status: DC | PRN
  Administered 2019-05-25: 20 mL

## 2019-05-25 MED ORDER — POVIDONE-IODINE 10 % EX SWAB: 2.0000 "application " | Freq: Once | CUTANEOUS | Status: DC

## 2019-05-25 MED ORDER — SODIUM CHLORIDE 0.9 % IV SOLN: INTRAVENOUS | Status: DC

## 2019-05-25 MED ORDER — MENTHOL 3 MG MT LOZG: 1.0000 | LOZENGE | OROMUCOSAL | Status: DC | PRN

## 2019-05-25 MED ORDER — FENTANYL CITRATE (PF) 250 MCG/5ML IJ SOLN
INTRAMUSCULAR | Status: AC
  Filled 2019-05-25: qty 5

## 2019-05-25 MED ORDER — LACTATED RINGERS IV SOLN: INTRAVENOUS | Status: DC | PRN

## 2019-05-25 MED ORDER — MIDAZOLAM HCL 5 MG/5ML IJ SOLN
INTRAMUSCULAR | Status: DC | PRN
  Administered 2019-05-25: 2 mg via INTRAVENOUS

## 2019-05-25 MED ORDER — TRANEXAMIC ACID-NACL 1000-0.7 MG/100ML-% IV SOLN
1000.0000 mg | Freq: Once | INTRAVENOUS | Status: AC
  Administered 2019-05-25: 1000 mg via INTRAVENOUS
  Filled 2019-05-25 (×2): qty 100

## 2019-05-25 MED ORDER — OXYCODONE HCL ER 10 MG PO T12A
10.0000 mg | EXTENDED_RELEASE_TABLET | Freq: Two times a day (BID) | ORAL | Status: DC
  Administered 2019-05-25 – 2019-05-26 (×3): 10 mg via ORAL
  Filled 2019-05-25 (×3): qty 1

## 2019-05-25 MED ORDER — SORBITOL 70 % SOLN
30.0000 mL | Freq: Every day | Status: DC | PRN
  Filled 2019-05-25: qty 30

## 2019-05-25 MED ORDER — METOCLOPRAMIDE HCL 5 MG/ML IJ SOLN: 5.0000 mg | Freq: Three times a day (TID) | INTRAMUSCULAR | Status: DC | PRN

## 2019-05-25 MED ORDER — ASPIRIN 81 MG PO CHEW
81.0000 mg | CHEWABLE_TABLET | Freq: Two times a day (BID) | ORAL | Status: DC
  Administered 2019-05-25 – 2019-05-26 (×2): 81 mg via ORAL
  Filled 2019-05-25 (×2): qty 1

## 2019-05-25 SURGICAL SUPPLY — 64 items
ALCOHOL 70% 16 OZ (MISCELLANEOUS) ×2 IMPLANT
BAG DECANTER FOR FLEXI CONT (MISCELLANEOUS) ×4 IMPLANT
CELLS DAT CNTRL 66122 CELL SVR (MISCELLANEOUS) IMPLANT
COVER PERINEAL POST (MISCELLANEOUS) ×2 IMPLANT
COVER SURGICAL LIGHT HANDLE (MISCELLANEOUS) ×2 IMPLANT
COVER WAND RF STERILE (DRAPES) ×2 IMPLANT
CUP ACET PNNCL SECTR W/GRIP 56 (Hips) ×1 IMPLANT
DERMABOND ADVANCED (GAUZE/BANDAGES/DRESSINGS) ×1
DERMABOND ADVANCED .7 DNX12 (GAUZE/BANDAGES/DRESSINGS) ×1 IMPLANT
DRAPE C-ARM 42X72 X-RAY (DRAPES) ×2 IMPLANT
DRAPE POUCH INSTRU U-SHP 10X18 (DRAPES) ×2 IMPLANT
DRAPE STERI IOBAN 125X83 (DRAPES) ×2 IMPLANT
DRAPE U-SHAPE 47X51 STRL (DRAPES) ×4 IMPLANT
DRSG AQUACEL AG ADV 3.5X10 (GAUZE/BANDAGES/DRESSINGS) ×2 IMPLANT
DURAPREP 26ML APPLICATOR (WOUND CARE) ×4 IMPLANT
ELECT BLADE 4.0 EZ CLEAN MEGAD (MISCELLANEOUS) ×2
ELECT REM PT RETURN 9FT ADLT (ELECTROSURGICAL) ×2
ELECTRODE BLDE 4.0 EZ CLN MEGD (MISCELLANEOUS) ×1 IMPLANT
ELECTRODE REM PT RTRN 9FT ADLT (ELECTROSURGICAL) ×1 IMPLANT
GLOVE BIOGEL PI IND STRL 7.0 (GLOVE) ×1 IMPLANT
GLOVE BIOGEL PI INDICATOR 7.0 (GLOVE) ×1
GLOVE ECLIPSE 7.0 STRL STRAW (GLOVE) ×4 IMPLANT
GLOVE SKINSENSE NS SZ7.5 (GLOVE) ×1
GLOVE SKINSENSE STRL SZ7.5 (GLOVE) ×1 IMPLANT
GLOVE SURG SYN 7.5  E (GLOVE) ×4
GLOVE SURG SYN 7.5 E (GLOVE) ×4 IMPLANT
GOWN STRL REIN XL XLG (GOWN DISPOSABLE) ×2 IMPLANT
GOWN STRL REUS W/ TWL LRG LVL3 (GOWN DISPOSABLE) IMPLANT
GOWN STRL REUS W/ TWL XL LVL3 (GOWN DISPOSABLE) ×1 IMPLANT
GOWN STRL REUS W/TWL LRG LVL3 (GOWN DISPOSABLE)
GOWN STRL REUS W/TWL XL LVL3 (GOWN DISPOSABLE) ×1
HANDPIECE INTERPULSE COAX TIP (DISPOSABLE) ×1
HEAD CERAMIC DELTA 28 P1.5 HIP (Head) ×2 IMPLANT
HOOD PEEL AWAY FLYTE STAYCOOL (MISCELLANEOUS) ×4 IMPLANT
INSERT DM PINNACLE 56X49 (Insert) ×2 IMPLANT
IV NS IRRIG 3000ML ARTHROMATIC (IV SOLUTION) ×2 IMPLANT
KIT BASIN OR (CUSTOM PROCEDURE TRAY) ×2 IMPLANT
LINER ACET MENTUM 49X28 (Liner) ×2 IMPLANT
MARKER SKIN DUAL TIP RULER LAB (MISCELLANEOUS) ×2 IMPLANT
NEEDLE SPNL 18GX3.5 QUINCKE PK (NEEDLE) ×4 IMPLANT
PACK TOTAL JOINT (CUSTOM PROCEDURE TRAY) ×2 IMPLANT
PACK UNIVERSAL I (CUSTOM PROCEDURE TRAY) ×2 IMPLANT
PINN SECTOR W/GRIP ACE CUP 56 (Hips) ×2 IMPLANT
RTRCTR WOUND ALEXIS 18CM MED (MISCELLANEOUS)
SAW OSC TIP CART 19.5X105X1.3 (SAW) ×2 IMPLANT
SCREW 6.5MMX25MM (Screw) ×2 IMPLANT
SET HNDPC FAN SPRY TIP SCT (DISPOSABLE) ×1 IMPLANT
SLEEVE SURGEON STRL (DRAPES) ×2 IMPLANT
STAPLER VISISTAT 35W (STAPLE) IMPLANT
STEM FEM ACTIS HIGH SZ7 (Stem) ×2 IMPLANT
SUT ETHIBOND 2 V 37 (SUTURE) ×2 IMPLANT
SUT ETHILON 3 0 PS 1 (SUTURE) ×6 IMPLANT
SUT VIC AB 0 CT1 27 (SUTURE) ×1
SUT VIC AB 0 CT1 27XBRD ANBCTR (SUTURE) ×1 IMPLANT
SUT VIC AB 1 CTX 36 (SUTURE) ×1
SUT VIC AB 1 CTX36XBRD ANBCTR (SUTURE) ×1 IMPLANT
SUT VIC AB 2-0 CT1 27 (SUTURE) ×3
SUT VIC AB 2-0 CT1 TAPERPNT 27 (SUTURE) ×3 IMPLANT
SYR 50ML LL SCALE MARK (SYRINGE) ×2 IMPLANT
TOWEL GREEN STERILE (TOWEL DISPOSABLE) ×2 IMPLANT
TRAY CATH 16FR W/PLASTIC CATH (SET/KITS/TRAYS/PACK) ×2 IMPLANT
TRAY FOLEY W/BAG SLVR 16FR (SET/KITS/TRAYS/PACK)
TRAY FOLEY W/BAG SLVR 16FR ST (SET/KITS/TRAYS/PACK) IMPLANT
YANKAUER SUCT BULB TIP NO VENT (SUCTIONS) ×2 IMPLANT

## 2019-05-25 NOTE — TOC Initial Note (Signed)
Transition of Care [facility]) - Initial/Assessment Note    Patient Details  Name: Sabastien Carpenter MRN: [id number] Date of Birth: 02-Apr-1950  [facility] CM/SW Contact:    Angelita Ingles, RN Phone Number: 05/25/2019, 1:30 PM  Clinical Narrative:    CM consulted for home health needs. CM at bedside to speak with patient and patient states that he thinks is active with home health. Spoke with Tiffany at [facility] who states that home health PT has been set up through [facility]. No further needs identified at this time. Will continue to follow as needed.                 Expected Discharge Plan: Home/Self Care Barriers to Discharge: Continued Medical Work up   Patient Goals and CMS Choice        Expected Discharge Plan and Services Expected Discharge Plan: Home/Self Care       Living arrangements for the past 2 months: [place]                                      Prior Living Arrangements/Services Living arrangements for the past 2 months: [place] Lives with:: Self Patient language and need for interpreter reviewed:: Yes Do you feel safe going back to the place where you live?: Yes      Need for Family Participation in Patient Care: No (Comment) Care giver support system in place?: Yes (comment)   Criminal Activity/Legal Involvement Pertinent to Current Situation/Hospitalization: No - Comment as needed  Activities of Daily Living Home Assistive Devices/Equipment: Cane (specify quad or straight), Dentures (specify type) ADL Screening (condition at time of admission) Patient's cognitive ability adequate to safely complete daily activities?: Yes Is the patient deaf or have difficulty hearing?: No Does the patient have difficulty seeing, even when wearing glasses/contacts?: No Does the patient have difficulty concentrating, remembering, or making decisions?: No Patient able to express need for assistance with  ADLs?: Yes Does the patient have difficulty dressing or bathing?: Yes Independently performs ADLs?: No Communication: Independent Dressing (OT): Needs assistance Is this a change from baseline?: Change from baseline, expected to last <3days Grooming: Independent Feeding: Independent Bathing: Needs assistance Is this a change from baseline?: Change from baseline, expected to last <3 days Toileting: Needs assistance Is this a change from baseline?: Change from baseline, expected to last <3 days In/Out Bed: Needs assistance Is this a change from baseline?: Change from baseline, expected to last <3 days Does the patient have difficulty walking or climbing stairs?: Yes Weakness of Legs: Right Weakness of Arms/Hands: None  Permission Sought/Granted                  Emotional Assessment Appearance:: Appears stated age Attitude/Demeanor/Rapport: Gracious   Orientation: : Oriented to Self, Oriented to Place, Oriented to  Time, Oriented to Situation Alcohol / Substance Use: Not Applicable    Admission diagnosis:  Status post total replacement of right hip [Z96.641] Patient Active Problem List   Diagnosis Date Noted  . Status post total replacement of right hip 05/25/2019  . 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  . Lumbar radiculopathy 02/05/2019   PCP:  Patient, No Pcp Per Pharmacy:   [facility], [place] [address]. [place]. [place] [address] Phone: [phone] Fax: [phone]     Social Determinants  of Health (SDOH) Interventions    Readmission Risk Interventions No flowsheet data found.  

## 2019-05-25 NOTE — Discharge Instructions (Signed)

## 2019-05-25 NOTE — Transfer of Care (Signed)
Immediate Anesthesia Transfer of Care Note  Patient: Gerald Carpenter  Procedure(s) Performed: RIGHT TOTAL HIP ARTHROPLASTY ANTERIOR APPROACH (Right Hip)  Patient Location: PACU  Anesthesia Type:Spinal  Level of Consciousness: awake, alert  and oriented  Airway & Oxygen Therapy: Patient Spontanous Breathing  Post-op Assessment: Report given to RN and Post -op Vital signs reviewed and stable  Post vital signs: Reviewed and stable  Last Vitals:  Vitals Value Taken Time  BP 90/51 05/25/19 0920  Temp    Pulse 62 05/25/19 0923  Resp 18 05/25/19 0923  SpO2 92 % 05/25/19 0923  Vitals shown include unvalidated device data.  Last Pain:  Vitals:   05/25/19 0618  TempSrc:   PainSc: 2          Complications: No apparent anesthesia complications

## 2019-05-25 NOTE — Op Note (Signed)
RIGHT TOTAL HIP ARTHROPLASTY ANTERIOR APPROACH  Procedure Note Anker Kravec   [id number]  Pre-op Diagnosis: Right hip degenerative joint disease     Post-op Diagnosis: same   Operative Procedures  1. Total hip replacement; Right hip; uncemented [id number]   Personnel  Surgeon(s): Leandrew Koyanagi, MD  Assist: Madalyn Rob, PA-C; necessary for the timely completion of procedure and due to complexity of procedure.   Anesthesia: spinal  Prosthesis: Depuy Acetabulum: Pinnacle 56 mm Femur: Actis 7 HO Head: 49/28 mm dual mobility size: +1.5 Liner: lateralized liner Bearing Type: dual mobility  Total Hip Arthroplasty (Anterior Approach) Op Note:  After informed consent was obtained and the operative extremity marked in the holding area, the patient was brought back to the operating room and placed supine on the HANA table. Next, the operative extremity was prepped and draped in normal sterile fashion. Surgical timeout occurred verifying patient identification, surgical site, surgical procedure and administration of antibiotics.  A modified anterior Smith-Peterson approach to the hip was performed, using the interval between tensor fascia lata and sartorius.  Dissection was carried bluntly down onto the anterior hip capsule. The lateral femoral circumflex vessels were identified and coagulated. A capsulotomy was performed and the capsular flaps tagged for later repair.  The neck osteotomy was performed. The femoral head was removed, the acetabular rim was cleared of soft tissue and calcified labrum and attention was turned to reaming the acetabulum.  Sequential reaming was performed under fluoroscopic guidance. We reamed to a size 55 mm, and then impacted the acetabular shell. A 25 mm cancellous screw was placed through the shell which had excellent fixation.  The liner was then placed after irrigation and attention turned to the femur.  After placing the femoral hook, the leg was  taken to externally rotated, extended and adducted position taking care to perform soft tissue releases to allow for adequate mobilization of the femur. Soft tissue was cleared from the shoulder of the greater trochanter and the hook elevator used to improve exposure of the proximal femur. Sequential broaching performed up to a size 7. Trial neck and head were placed. The leg was brought back up to neutral and the construct reduced. The position and sizing of components, offset and leg lengths were checked using fluoroscopy. Stability of the construct was checked in extension and external rotation without any subluxation or impingement of prosthesis. We dislocated the prosthesis, dropped the leg back into position, removed trial components, and irrigated copiously. The final stem and head was then placed, the leg brought back up, the system reduced and fluoroscopy used to verify positioning.  We irrigated, obtained hemostasis and closed the capsule using #2 ethibond suture.  One gram of vancomycin powder was placed in the surgical bed. The fascia was closed with #1 vicryl plus, the deep fat layer was closed with 0 vicryl, the subcutaneous layers closed with 2.0 Vicryl Plus and the skin closed with 2.0 nylon and dermabond. A sterile dressing was applied. The patient was awakened in the operating room and taken to recovery in stable condition.  All sponge, needle, and instrument counts were correct at the end of the case.   Position: supine  Complications: see description of procedure.  Time Out: performed   Drains/Packing: none  Estimated blood loss: see anesthesia record  Returned to Recovery Room: in good condition.   Antibiotics: yes   Mechanical VTE (DVT) Prophylaxis: sequential compression devices, TED thigh-high  Chemical VTE (DVT) Prophylaxis: aspirin   Fluid Replacement:  see anesthesia record  Specimens Removed: 1 to pathology   Sponge and Instrument Count Correct? yes   PACU:  portable radiograph - low AP   Plan/RTC: Return in 2 weeks for staple removal. Weight Bearing/Load Lower Extremity: full  Hip precautions: none Suture Removal: 2 weeks   N. Eduard Roux, MD [facility] 8:44 AM   Implant Name Type Inv. Item Serial No. Manufacturer Lot No. LRB No. Used Action  PINN SECTOR W/GRIP ACE CUP 56 - [id number] Hips PINN SECTOR W/GRIP ACE CUP 56  DEPUY SYNTHES [id number] Right 1 Implanted  SCREW 6.5MMX25MM - [id number] Screw SCREW 6.5MMX25MM  DEPUY SYNTHES [id number] Right 1 Implanted  STEM FEM ACTIS HIGH SZ7 - [id number] Stem STEM FEM ACTIS HIGH SZ7  DEPUY SYNTHES [id number] Right 1 Implanted  HEAD CERAMIC DELTA 28 P1.5 HIP - [id number] Head HEAD CERAMIC DELTA 28 P1.5 HIP  DEPUY SYNTHES [id number] Right 1 Implanted  BI-MENTUM PE LINER 28/49    DEPUY ORTHOPAED[employer] [id number] A Right 1 Implanted

## 2019-05-25 NOTE — Evaluation (Signed)
Physical Therapy Evaluation Patient Details Name: Gerald Carpenter MRN: [id number] DOB: [birth year] Today's Date: 05/25/2019   History of Present Illness  Pt is a 69 y/o male s/p R THA, direct anterior. PMH includes lumbar surgery.   Clinical Impression  Pt is s/p surgery above with deficits below. Tolerated gait training well this session and required min guard A with use of RW. Educated about performing ankle pumps 20X/every hour. Will continue to follow acutely to maximize functional mobility independence and safety.     Follow Up Recommendations Follow surgeon's recommendation for DC plan and follow-up therapies;Supervision for mobility/OOB    Equipment Recommendations  Rolling walker with 5" wheels;3in1 (PT)    Recommendations for Other Services       Precautions / Restrictions Precautions Precautions: None Restrictions Weight Bearing Restrictions: Yes RLE Weight Bearing: Weight bearing as tolerated      Mobility  Bed Mobility Overal bed mobility: Needs Assistance Bed Mobility: Supine to Sit     Supine to sit: Min assist     General bed mobility comments: Min A for RLE assist. Increased time to come to sitting.   Transfers Overall transfer level: Needs assistance Equipment used: Rolling walker (2 wheeled) Transfers: Sit to/from Stand Sit to Stand: Min guard         General transfer comment: Min guard A for steadying assist. Cues for safe hand placement.   Ambulation/Gait Ambulation/Gait assistance: Min guard Gait Distance (Feet): 100 Feet Assistive device: Rolling walker (2 wheeled) Gait Pattern/deviations: Step-to pattern;Step-through pattern;Decreased step length - right;Decreased step length - left;Decreased weight shift to right;Antalgic Gait velocity: Decreased   General Gait Details: Slow, antalgic gait. Cues for sequencing using RW. Able to progress to step through pattern with gait progression   Stairs            Wheelchair Mobility     Modified Rankin (Stroke Patients Only)       Balance Overall balance assessment: Needs assistance Sitting-balance support: No upper extremity supported;Feet supported Sitting balance-Leahy Scale: Good     Standing balance support: Bilateral upper extremity supported;During functional activity Standing balance-Leahy Scale: Poor Standing balance comment: Reliant on BUE support                              Pertinent Vitals/Pain Pain Assessment: 0-10 Pain Score: 4  Pain Location: R hip  Pain Descriptors / Indicators: Operative site guarding(stiff ) Pain Intervention(s): Limited activity within patient's tolerance;Monitored during session;Repositioned    Home Living Family/patient expects to be discharged to:: Private residence Living Arrangements: Spouse/significant other;Other (Comment)(brother) Available Help at Discharge: Family;Available 24 hours/day Type of Home: Apartment Home Access: Stairs to enter Entrance Stairs-Rails: Right;Left Entrance Stairs-Number of Steps: 3 flights Home Layout: One level Home Equipment: Cane - single point      Prior Function Level of Independence: Independent with assistive device(s)         Comments: Has been using cane for ambulation.      Hand Dominance        Extremity/Trunk Assessment   Upper Extremity Assessment Upper Extremity Assessment: Overall WFL for tasks assessed    Lower Extremity Assessment Lower Extremity Assessment: RLE deficits/detail RLE Deficits / Details: Deficits consistent with post op pain and weakness.     Cervical / Trunk Assessment Cervical / Trunk Assessment: Normal  Communication   Communication: No difficulties  Cognition Arousal/Alertness: Awake/alert Behavior During Therapy: WFL for tasks assessed/performed Overall Cognitive Status: Within Functional  Limits for tasks assessed                                        General Comments      Exercises Total  Joint Exercises Ankle Circles/Pumps: AROM;Both;10 reps   Assessment/Plan    PT Assessment Patient needs continued PT services  PT Problem List Decreased strength;Decreased range of motion;Decreased balance;Decreased mobility;Decreased knowledge of use of DME;Pain       PT Treatment Interventions DME instruction;Gait training;Stair training;Functional mobility training;Therapeutic activities;Therapeutic exercise;Balance training;Patient/family education    PT Goals (Current goals can be found in the Care Plan section)  Acute Rehab PT Goals Patient Stated Goal: to go home PT Goal Formulation: With patient Time For Goal Achievement: 06/08/19 Potential to Achieve Goals: Good    Frequency 7X/week   Barriers to discharge Inaccessible home environment 3 flights of steps to enter    Co-evaluation               AM-PAC PT "6 Clicks" Mobility  Outcome Measure Help needed turning from your back to your side while in a flat bed without using bedrails?: A Little Help needed moving from lying on your back to sitting on the side of a flat bed without using bedrails?: A Little Help needed moving to and from a bed to a chair (including a wheelchair)?: A Little Help needed standing up from a chair using your arms (e.g., wheelchair or bedside chair)?: A Little Help needed to walk in hospital room?: A Little Help needed climbing 3-5 steps with a railing? : A Lot 6 Click Score: 17    End of Session Equipment Utilized During Treatment: Gait belt Activity Tolerance: Patient tolerated treatment well Patient left: in chair;with call bell/phone within reach Nurse Communication: Mobility status PT Visit Diagnosis: Other abnormalities of gait and mobility (R26.89);Pain Pain - Right/Left: Right Pain - part of body: Hip    Time: 1435-1454 PT Time Calculation (min) (ACUTE ONLY): 19 min   Charges:   PT Evaluation $PT Eval Low Complexity: 1 Low          Lou Miner, DPT  Acute  Rehabilitation Services  Pager: [phone] Office: [phone]   Rudean Hitt 05/25/2019, 5:58 PM

## 2019-05-25 NOTE — Anesthesia Postprocedure Evaluation (Signed)
Anesthesia Post Note  Patient: Gerald Carpenter  Procedure(s) Performed: RIGHT TOTAL HIP ARTHROPLASTY ANTERIOR APPROACH (Right Hip)     Patient location during evaluation: PACU Anesthesia Type: Spinal Level of consciousness: awake and alert and oriented Pain management: pain level controlled Vital Signs Assessment: post-procedure vital signs reviewed and stable Respiratory status: spontaneous breathing, nonlabored ventilation and respiratory function stable Cardiovascular status: blood pressure returned to baseline and stable Postop Assessment: no headache, no backache and spinal receding Anesthetic complications: no    Last Vitals:  Vitals:   05/25/19 0934 05/25/19 0949  BP: (!) 107/59 (!) 122/96  Pulse: (!) 56 (!) 58  Resp: 16 15  Temp:    SpO2: 99% 100%    Last Pain:  Vitals:   05/25/19 0949  TempSrc:   PainSc: 0-No pain                 Pervis Hocking

## 2019-05-25 NOTE — Anesthesia Procedure Notes (Signed)
Spinal  Patient location during procedure: OR Start time: 05/25/2019 7:16 AM End time: 05/25/2019 7:23 AM Staffing Performed: anesthesiologist  Anesthesiologist: Pervis Hocking, DO Preanesthetic Checklist Completed: patient identified, IV checked, risks and benefits discussed, surgical consent, monitors and equipment checked, pre-op evaluation and timeout performed Spinal Block Patient position: sitting Prep: DuraPrep and site prepped and draped Patient monitoring: cardiac monitor, continuous pulse ox and blood pressure Approach: midline Location: L3-4 Injection technique: single-shot Needle Needle type: Pencan  Needle gauge: 24 G Needle length: 9 cm Assessment Sensory level: T6 Additional Notes Functioning IV was confirmed and monitors were applied. Sterile prep and drape, including hand hygiene and sterile gloves were used. The patient was positioned and the spine was prepped. The skin was anesthetized with lidocaine.  Free flow of clear CSF was obtained prior to injecting local anesthetic into the CSF.  The spinal needle aspirated freely following injection.  The needle was carefully withdrawn.  The patient tolerated the procedure well.

## 2019-05-25 NOTE — H&P (Signed)
PREOPERATIVE H&P  Chief Complaint: right hip degenerative joint disease  HPI: Gerald Carpenter is a 69 y.o. male who presents for surgical treatment of right hip degenerative joint disease.  He denies any changes in medical history.  Past Medical History:  Diagnosis Date  . Arthritis   . Nephritis    when pt. was 7 yrs. ols   Past Surgical History:  Procedure Laterality Date  . ANTERIOR LAT LUMBAR FUSION Right 02/05/2019   Procedure: ATTEMPTED ANTERIOR LATERAL INTERBODY FUSION;  Surgeon: Consuella Lose, MD;  Location: [facility];  Service: Neurosurgery;  Laterality: Right;  RIGHT LATERAL TRANSPSOAS DISCECTOMY AND FUSION WITH ROBOTIC ASSISTED PEDICLE SCREW STABILIZATION, LUMBAR 4- LUMBAR 5  . COLONOSCOPY    . EYE SURGERY Bilateral 2013   lens implants for cataracts after Lasik  . KNEE ARTHROSCOPY Right 1980  . TONSILLECTOMY     at a very young age, but grew back   Social History   Socioeconomic History  . Marital status: Married    Spouse name: Not on file  . Number of children: Not on file  . Years of education: Not on file  . Highest education level: Not on file  Occupational History  . Not on file  Tobacco Use  . Smoking status: Current Some Day Smoker    Packs/day: 0.50    Years: 60.00    Pack years: 30.00    Types: Cigarettes  . Smokeless tobacco: Never Used  Substance and Sexual Activity  . 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 Drug use: Not Currently    Comment: prior usage of "speed"  when drinking  . Sexual activity: Not on file  Other Topics Concern  . Not on file  Social History Narrative   Recently moved here from [facility], [place]   Social Determinants of Health   Financial Resource Strain:   . Difficulty of Paying Living Expenses:   Food Insecurity:   . Worried About Charity fundraiser in the Last Year:   . Arboriculturist in the Last Year:   Transportation Needs:   . Film/video editor (Medical):    Marland Kitchen Lack of Transportation (Non-Medical):   Physical Activity:   . Days of Exercise per Week:   . Minutes of Exercise per Session:   Stress:   . Feeling of Stress :   Social Connections:   . Frequency of Communication with Friends and Family:   . Frequency of Social Gatherings with Friends and Family:   . Attends Religious Services:   . Active Member of Clubs or Organizations:   . Attends Archivist Meetings:   Marland Kitchen Marital Status:    History reviewed. No pertinent family history. No Known Allergies Prior to Admission medications   Medication Sig Start Date End Date Taking? Authorizing Provider  methocarbamol (ROBAXIN) 500 MG tablet Take 1 tablet (500 mg total) by mouth 2 (two) times daily as needed. 05/19/19  Yes Aundra Dubin, PA-C  naproxen sodium (ALEVE) 220 MG tablet Take 220 mg by mouth 2 (two) times daily as needed (pain).   Yes [provider]  aspirin EC 81 MG tablet Take 1 tablet (81 mg total) by mouth 2 (two) times daily. 05/19/19   Aundra Dubin, PA-C  docusate sodium (COLACE) 100 MG capsule Take 1 capsule (100 mg total) by mouth daily as needed. 05/19/19 05/18/20  Aundra Dubin, PA-C  ondansetron [facility]) 4  MG tablet Take 1 tablet (4 mg total) by mouth every 8 (eight) hours as needed for nausea or vomiting. 05/19/19   Aundra Dubin, PA-C  oxyCODONE (OXYCONTIN) 10 mg 12 hr tablet Take 1 tablet (10 mg total) by mouth every 12 (twelve) hours. 05/19/19   Aundra Dubin, PA-C  oxyCODONE-acetaminophen (PERCOCET) 5-325 MG tablet Take 1-2 tablets by mouth every 6 (six) hours as needed for severe pain. 05/19/19   Aundra Dubin, PA-C     Positive ROS: All other systems have been reviewed and were otherwise negative with the exception of those mentioned in the HPI and as above.  Physical Exam: General: Alert, no acute distress Cardiovascular: No pedal edema Respiratory: No cyanosis, no use of accessory musculature GI: abdomen soft Skin: No lesions in the  area of chief complaint Neurologic: Sensation intact distally Psychiatric: Patient is competent for consent with normal mood and affect Lymphatic: no lymphedema  MUSCULOSKELETAL: exam stable  Assessment: right hip degenerative joint disease  Plan: Plan for Procedure(s): RIGHT TOTAL HIP ARTHROPLASTY ANTERIOR APPROACH  The risks benefits and alternatives were discussed with the patient including but not limited to the risks of nonoperative treatment, versus surgical intervention including infection, bleeding, nerve injury,  blood clots, cardiopulmonary complications, morbidity, mortality, among others, and they were willing to proceed.   Preoperative templating of the joint replacement has been completed, documented, and submitted to the Operating Room personnel in order to optimize intra-operative equipment management.  Eduard Roux, MD   05/25/2019 6:56 AM

## 2019-05-26 ENCOUNTER — Encounter: Payer: Self-pay | Admitting: *Deleted

## 2019-05-26 DIAGNOSIS — M1611 Unilateral primary osteoarthritis, right hip: Secondary | ICD-10-CM | POA: Diagnosis not present

## 2019-05-26 LAB — CBC
HCT: 38.1 % — ABNORMAL LOW (ref 39.0–52.0)
Hemoglobin: 12.4 g/dL — ABNORMAL LOW (ref 13.0–17.0)
MCH: 31.3 pg (ref 26.0–34.0)
MCHC: 32.5 g/dL (ref 30.0–36.0)
MCV: 96.2 fL (ref 80.0–100.0)
Platelets: 150 10*3/uL (ref 150–400)
RBC: 3.96 MIL/uL — ABNORMAL LOW (ref 4.22–5.81)
RDW: 14.2 % (ref 11.5–15.5)
WBC: 17.2 10*3/uL — ABNORMAL HIGH (ref 4.0–10.5)
nRBC: 0 % (ref 0.0–0.2)

## 2019-05-26 LAB — BASIC METABOLIC PANEL
Anion gap: 8 (ref 5–15)
BUN: 17 mg/dL (ref 8–23)
CO2: 25 mmol/L (ref 22–32)
Calcium: 8 mg/dL — ABNORMAL LOW (ref 8.9–10.3)
Chloride: 103 mmol/L (ref 98–111)
Creatinine, Ser: 1.27 mg/dL — ABNORMAL HIGH (ref 0.61–1.24)
GFR calc Af Amer: >60 mL/min (ref >60)
GFR calc non Af Amer: 58 mL/min — ABNORMAL LOW (ref >60)
Glucose, Bld: 105 mg/dL — ABNORMAL HIGH (ref 70–99)
Potassium: 4.3 mmol/L (ref 3.5–5.1)
Sodium: 136 mmol/L (ref 135–145)

## 2019-05-26 NOTE — Plan of Care (Signed)
Pt doing well. Pt given D/C instructions with verbal understanding. Pt's incision is clean and dry with no sign of infection. Pt's IV was removed prior to D/C. Home Health was arranged by MD's office prior to surgery. Pt received RW and 3-n-1 from [employer] per MD order. Pt D/C'd home via wheelchair per MD order. Pt is stable @ D/C and has no other needs at this time. Holli Humbles, RN

## 2019-05-26 NOTE — Progress Notes (Signed)
Subjective: 1 Day Post-Op Procedure(s) (LRB): RIGHT TOTAL HIP ARTHROPLASTY ANTERIOR APPROACH (Right) Patient reports pain as mild.    Objective: Vital signs in last 24 hours: Temp:  [97 F (36.1 C)-98.5 F (36.9 C)] 98.5 F (36.9 C) (04/06 0745) Pulse Rate:  [55-92] 92 (04/06 0745) Resp:  [13-18] 16 (04/06 0745) BP: (81-127)/(46-96) 110/61 (04/06 0745) SpO2:  [91 %-100 %] 95 % (04/06 0745)  Intake/Output from previous day: 04/05 0701 - 04/06 0700 In: 440 [P.O.:240; IV Piggyback:200] Out: 800 [Urine:600; Blood:200] Intake/Output this shift: No intake/output data recorded.  Recent Labs    05/26/19 0457  HGB 12.4*   Recent Labs    05/26/19 0457  WBC 17.2*  RBC 3.96*  HCT 38.1*  PLT 150   Recent Labs    05/26/19 0457  NA 136  K 4.3  CL 103  CO2 25  BUN 17  CREATININE 1.27*  GLUCOSE 105*  CALCIUM 8.0*   No results for input(s): LABPT, INR in the last 72 hours.  Neurologically intact Neurovascular intact Sensation intact distally Intact pulses distally Dorsiflexion/Plantar flexion intact Incision: dressing C/D/I No cellulitis present Compartment soft   Assessment/Plan: 1 Day Post-Op Procedure(s) (LRB): RIGHT TOTAL HIP ARTHROPLASTY ANTERIOR APPROACH (Right) Advance diet Up with therapy D/C IV fluids Discharge home with home health  WBAT RLE ABLA- mild and stable      Aundra Dubin 05/26/2019, 8:13 AM

## 2019-05-26 NOTE — Progress Notes (Signed)
Physical Therapy Treatment Patient Details Name: Gerald Carpenter MRN: [id number] DOB: [birth year] Today's Date: 05/26/2019    History of Present Illness Pt is a 69 y/o male s/p R THA, direct anterior. PMH includes lumbar surgery.     PT Comments    Patient progressing well towards PT goals. Reports some mild pain and stiffness in right hip. Improved ambulation distance with Min guard assist for balance/safety and improved gait mechanics (step through gait) this session with cues. Tolerated there ex in standing and provided HEP handout. Plan for stair training this afternoon prior to d/c.  Encouraged mobility in the room. Will follow.   Follow Up Recommendations  Follow surgeon's recommendation for DC plan and follow-up therapies;Supervision for mobility/OOB     Equipment Recommendations  Rolling walker with 5" wheels;3in1 (PT)    Recommendations for Other Services       Precautions / Restrictions Precautions Precautions: None Restrictions Weight Bearing Restrictions: Yes RLE Weight Bearing: Weight bearing as tolerated    Mobility  Bed Mobility               General bed mobility comments: Sitting EOB upon PT arrival.  Transfers Overall transfer level: Needs assistance Equipment used: Rolling walker (2 wheeled) Transfers: Sit to/from Stand Sit to Stand: Min guard         General transfer comment: Min guard for safety. Stood from [employer], transferred to chair post ambulation.  Ambulation/Gait Ambulation/Gait assistance: Min guard Gait Distance (Feet): 300 Feet Assistive device: Rolling walker (2 wheeled) Gait Pattern/deviations: Step-to pattern;Step-through pattern;Decreased step length - right;Decreased step length - left;Decreased weight shift to right;Antalgic Gait velocity: Decreased   General Gait Details: Slow, antalgic gait. Cues for sequencing using RW. Able to progress to step through pattern with gait progression. Stiffness reported.   Stairs              Wheelchair Mobility    Modified Rankin (Stroke Patients Only)       Balance Overall balance assessment: Needs assistance Sitting-balance support: No upper extremity supported;Feet supported Sitting balance-Leahy Scale: Good     Standing balance support: During functional activity Standing balance-Leahy Scale: Poor Standing balance comment: Reliant on BUE support                             Cognition Arousal/Alertness: Awake/alert Behavior During Therapy: WFL for tasks assessed/performed Overall Cognitive Status: Within Functional Limits for tasks assessed                                        Exercises Total Joint Exercises Hip ABduction/ADduction: Strengthening;Both;10 reps;Standing Long Arc Quad: AROM;Right;10 reps;Seated Marching in Standing: AROM;Strengthening;Both;10 reps;Standing Standing Hip Extension: AROM;Strengthening;Both;10 reps;Standing Other Exercises Other Exercises: standing hamcurls x10 BLEs    General Comments        Pertinent Vitals/Pain Pain Assessment: 0-10 Pain Score: 6  Pain Location: R hip  Pain Descriptors / Indicators: Operative site guarding;Sore(stiff) Pain Intervention(s): Repositioned;Monitored during session;Limited activity within patient's tolerance    Home Living                      Prior Function            PT Goals (current goals can now be found in the care plan section) Progress towards PT goals: Progressing toward goals    Frequency  7X/week      PT Plan Current plan remains appropriate    Co-evaluation              AM-PAC PT "6 Clicks" Mobility   Outcome Measure  Help needed turning from your back to your side while in a flat bed without using bedrails?: None Help needed moving from lying on your back to sitting on the side of a flat bed without using bedrails?: A Little Help needed moving to and from a bed to a chair (including a wheelchair)?: A  Little Help needed standing up from a chair using your arms (e.g., wheelchair or bedside chair)?: A Little Help needed to walk in hospital room?: A Little Help needed climbing 3-5 steps with a railing? : A Little 6 Click Score: 19    End of Session Equipment Utilized During Treatment: Gait belt Activity Tolerance: Patient tolerated treatment well Patient left: in chair;with call bell/phone within reach Nurse Communication: Mobility status PT Visit Diagnosis: Other abnormalities of gait and mobility (R26.89);Pain Pain - Right/Left: Right Pain - part of body: Hip     Time: 0715-0741 PT Time Calculation (min) (ACUTE ONLY): 26 min  Charges:  $Gait Training: 8-22 mins $Therapeutic Exercise: 8-22 mins                     Marisa Severin, PT, DPT Acute Rehabilitation Services Pager [phone] Office [place] 05/26/2019, 10:27 AM

## 2019-05-26 NOTE — TOC Transition Note (Signed)
Transition of Care [facility]) - CM/SW Discharge Note   Patient Details  Name: Gerald Carpenter MRN: [id number] Date of Birth: August 20, 1950  [facility] CM/SW Contact:  Angelita Ingles, RN Phone Number: 05/26/2019, 10:27 AM   Clinical Narrative:   Patient is aware that home health services have been set up and unit will supply DME. No other needs identified at this time.     Final next level of care: Home/Self Care Barriers to Discharge: No Barriers Identified   Patient Goals and CMS Choice        Discharge Placement                       Discharge Plan and Services                        Representative spoke with at DME Agency: (Agency report that [facility] was already set up)   [place]: [facility] [employer] Date [place]: 05/26/19 Time [place]: 1027 Representative spoke with at [place]: Tiffany(Agency reports that [facility] services have been established)  Social Determinants of Health ([place]) Interventions     Readmission Risk Interventions No flowsheet data found.

## 2019-05-26 NOTE — Progress Notes (Signed)
Physical Therapy Treatment Patient Details Name: Gerald Carpenter MRN: [id number] DOB: 1950-11-04 Today's Date: 05/26/2019    History of Present Illness Pt is a 69 y/o male s/p R THA, direct anterior. PMH includes lumbar surgery.     PT Comments    Patient progressing well this afternoon. This session focused on gait training and stair training with Min guard progressing to supervision for safety. Min A needed on stairs as PT simulated being a SPC with HHA. Instructed pt to have family support for negotiating stairs. Pt agreeable. Plans to d/c home this afternoon. Will follow if still in the hospital.    Follow Up Recommendations  Follow surgeon's recommendation for DC plan and follow-up therapies;Supervision for mobility/OOB     Equipment Recommendations  Rolling walker with 5" wheels;3in1 (PT)    Recommendations for Other Services       Precautions / Restrictions Precautions Precautions: None Restrictions Weight Bearing Restrictions: Yes RLE Weight Bearing: Weight bearing as tolerated    Mobility  Bed Mobility               General bed mobility comments: Sitting EOB upon PT arrival.  Transfers Overall transfer level: Needs assistance Equipment used: Rolling walker (2 wheeled) Transfers: Sit to/from Stand Sit to Stand: Supervision         General transfer comment: Supervision for safety.  Ambulation/Gait Ambulation/Gait assistance: Supervision;Min guard Gait Distance (Feet): 320 Feet Assistive device: Rolling walker (2 wheeled) Gait Pattern/deviations: Step-to pattern;Step-through pattern;Decreased step length - right;Decreased step length - left;Decreased weight shift to right;Antalgic Gait velocity: improved   General Gait Details: Slow, antalgic gait but able to increase speed with cues.  Able to progress to step through pattern with gait progression.   Stairs Stairs: Yes Stairs assistance: Min guard;Min assist Stair Management: One rail Left;Step to  pattern Number of Stairs: 13 General stair comments: Cues for technique and safety.   Wheelchair Mobility    Modified Rankin (Stroke Patients Only)       Balance Overall balance assessment: Needs assistance Sitting-balance support: No upper extremity supported;Feet supported Sitting balance-Leahy Scale: Good     Standing balance support: During functional activity Standing balance-Leahy Scale: Poor Standing balance comment: Reliant on BUE support                             Cognition Arousal/Alertness: Awake/alert Behavior During Therapy: WFL for tasks assessed/performed Overall Cognitive Status: No family/caregiver present to determine baseline cognitive functioning                                 General Comments: Question memory as pt giving inconsistent info to PT and nurse; not sure if he is going home/asking nurse when he should call his wife      Exercises Total Joint Exercises Hip ABduction/ADduction: Strengthening;Both;10 reps;Standing Long Arc Quad: AROM;Right;10 reps;Seated Marching in Standing: AROM;Strengthening;Both;10 reps;Standing Standing Hip Extension: AROM;Strengthening;Both;10 reps;Standing Other Exercises Other Exercises: standing hamcurls x10 BLEs    General Comments        Pertinent Vitals/Pain Pain Assessment: Faces Pain Score: 6  Faces Pain Scale: Hurts little more Pain Location: R hip  Pain Descriptors / Indicators: Operative site guarding;Sore Pain Intervention(s): Repositioned;Monitored during session    Home Living                      Prior Function  PT Goals (current goals can now be found in the care plan section) Progress towards PT goals: Progressing toward goals    Frequency    7X/week      PT Plan Current plan remains appropriate    Co-evaluation              AM-PAC PT "6 Clicks" Mobility   Outcome Measure  Help needed turning from your back to your side  while in a flat bed without using bedrails?: None Help needed moving from lying on your back to sitting on the side of a flat bed without using bedrails?: None Help needed moving to and from a bed to a chair (including a wheelchair)?: None Help needed standing up from a chair using your arms (e.g., wheelchair or bedside chair)?: None Help needed to walk in hospital room?: A Little Help needed climbing 3-5 steps with a railing? : A Little 6 Click Score: 22    End of Session Equipment Utilized During Treatment: Gait belt Activity Tolerance: Patient tolerated treatment well Patient left: in chair;with call bell/phone within reach Nurse Communication: Mobility status PT Visit Diagnosis: Other abnormalities of gait and mobility (R26.89);Pain Pain - Right/Left: Right Pain - part of body: Hip     Time: [id number] PT Time Calculation (min) (ACUTE ONLY): 21 min  Charges:  $Gait Training: 8-22 mins                     Marisa Severin, PT, DPT Acute Rehabilitation Services Pager [phone] Office [place] 05/26/2019, 11:57 AM

## 2019-05-26 NOTE — Care Management Obs Status (Signed)
[place] NOTIFICATION   Patient Details  Name: Gerald Carpenter MRN: [id number] Date of Birth: 09-15-1950   Medicare Observation Status Notification Given:  Yes    Angelita Ingles, RN 05/26/2019, 10:21 AM

## 2019-05-26 NOTE — Discharge Summary (Signed)
Patient ID: Gerald Carpenter MRN: [id number] DOB/AGE: November 15, 1950 69 y.o.  Admit date: 05/25/2019 Discharge date: 05/26/2019  Admission Diagnoses:  Principal Problem:   Primary osteoarthritis of right hip Active Problems:   Status post total replacement of right hip   Discharge Diagnoses:  Same  Past Medical History:  Diagnosis Date  . Arthritis   . Nephritis    when pt. was 7 yrs. ols    Surgeries: Procedure(s): RIGHT TOTAL HIP ARTHROPLASTY ANTERIOR APPROACH on 05/25/2019   Consultants:   Discharged Condition: Improved  Hospital Course: Gerald Carpenter is an 69 y.o. male who was admitted 05/25/2019 for operative treatment ofPrimary osteoarthritis of right hip. Patient has severe unremitting pain that affects sleep, daily activities, and work/hobbies. After pre-op clearance the patient was taken to the operating room on 05/25/2019 and underwent  Procedure(s): RIGHT TOTAL HIP ARTHROPLASTY ANTERIOR APPROACH.    Patient was given perioperative antibiotics:  Anti-infectives (From admission, onward)   Start     Dose/Rate Route Frequency Ordered Stop   05/25/19 1300  ceFAZolin (ANCEF) IVPB 2g/100 mL premix     2 g 200 mL/hr over 30 Minutes Intravenous Every 6 hours 05/25/19 1136 05/26/19 0052   05/25/19 0615  ceFAZolin (ANCEF) IVPB 2g/100 mL premix     2 g 200 mL/hr over 30 Minutes Intravenous On call to O.R. 05/25/19 0612 05/25/19 0725       Patient was given sequential compression devices, early ambulation, and chemoprophylaxis to prevent DVT.  Patient benefited maximally from hospital stay and there were no complications.    Recent vital signs:  Patient Vitals for the past 24 hrs:  BP Temp Temp src Pulse Resp SpO2  05/26/19 0745 110/61 98.5 F (36.9 C) Oral 92 16 95 %  05/26/19 0411 (!) 93/56 98.5 F (36.9 C) Oral 78 18 92 %  05/25/19 2309 (!) 98/50 98.3 F (36.8 C) Oral 81 18 91 %  05/25/19 1937 (!) 95/56 98.5 F (36.9 C) Oral 81 18 94 %  05/25/19 1638 (!) 108/59 98.1 F  (36.7 C) Oral 79 16 97 %  05/25/19 1154 (!) 81/46 97.7 F (36.5 C) Oral 62 18 98 %  05/25/19 1119 115/67 98.5 F (36.9 C) -- 68 13 100 %  05/25/19 1104 121/68 -- -- 69 14 100 %  05/25/19 1049 119/69 -- -- 63 14 100 %  05/25/19 1034 127/63 -- -- 66 16 100 %  05/25/19 1019 124/64 -- -- 62 14 100 %  05/25/19 1004 115/64 -- -- (!) 55 16 100 %  05/25/19 0949 (!) 122/96 -- -- (!) 58 15 100 %  05/25/19 0934 (!) 107/59 -- -- (!) 56 16 99 %  05/25/19 0920 (!) 90/51 (!) 97 F (36.1 C) -- -- 15 96 %     Recent laboratory studies:  Recent Labs    05/26/19 0457  WBC 17.2*  HGB 12.4*  HCT 38.1*  PLT 150  NA 136  K 4.3  CL 103  CO2 25  BUN 17  CREATININE 1.27*  GLUCOSE 105*  CALCIUM 8.0*     Discharge Medications:   Allergies as of 05/26/2019   No Known Allergies     Medication List    STOP taking these medications   naproxen sodium 220 MG tablet Commonly known as: ALEVE     TAKE these medications   aspirin EC 81 MG tablet Take 1 tablet (81 mg total) by mouth 2 (two) times daily.   docusate sodium 100 MG capsule Commonly  known as: Colace Take 1 capsule (100 mg total) by mouth daily as needed.   methocarbamol 500 MG tablet Commonly known as: Robaxin Take 1 tablet (500 mg total) by mouth 2 (two) times daily as needed.   ondansetron 4 MG tablet Commonly known as: Zofran Take 1 tablet (4 mg total) by mouth every 8 (eight) hours as needed for nausea or vomiting.   oxyCODONE 10 mg 12 hr tablet Commonly known as: OXYCONTIN Take 1 tablet (10 mg total) by mouth every 12 (twelve) hours.   oxyCODONE-acetaminophen 5-325 MG tablet Commonly known as: Percocet Take 1-2 tablets by mouth every 6 (six) hours as needed for severe pain.            Durable Medical Equipment  (From admission, onward)         Start     Ordered   05/25/19 1136  DME Walker rolling  Once    Question:  Patient needs a walker to treat with the following condition  Answer:  History of hip  replacement   05/25/19 1136   05/25/19 1136  DME 3 n 1  Once     05/25/19 1136   05/25/19 1136  DME Bedside commode  Once    Question:  Patient needs a bedside commode to treat with the following condition  Answer:  History of hip replacement   05/25/19 1136          Diagnostic Studies: DG Chest 2 View  Result Date: 05/21/2019 CLINICAL DATA:  Pre-admission films.  Patient for right hip surgery. EXAM: CHEST - 2 VIEW COMPARISON:  None. FINDINGS: The chest is hyperexpanded with flattening of the hemidiaphragms and attenuation of the pulmonary vasculature compatible with COPD. Lungs are clear. No pneumothorax or pleural effusion. Heart size is normal. No acute or focal bony abnormality. IMPRESSION: No acute disease. Findings compatible with COPD. Electronically Signed   By: Inge Rise M.D.   On: 05/21/2019 15:56   DG Pelvis Portable  Result Date: 05/25/2019 CLINICAL DATA:  Status post total hip replacement on the right EXAM: PORTABLE PELVIS 1-2 VIEWS COMPARISON:  Intraoperative right hip images obtained earlier in the day FINDINGS: There is a total hip replacement on the right with prosthetic components well-seated on frontal view. Soft tissue air on the right is an expected postoperative finding. No fracture or dislocation. There is mild narrowing of the left hip joint. Postoperative changes noted in the visualized lower lumbar spine. IMPRESSION: Status post total hip replacement right with prosthetic components well-seated on frontal view. No fracture or dislocation. Soft tissue air on the right is an expected postoperative finding. Electronically Signed   By: Lowella Grip III M.D.   On: 05/25/2019 11:44   DG C-Arm 1-60 Min  Result Date: 05/25/2019 CLINICAL DATA:  Right total hip arthroplasty. EXAM: OPERATIVE RIGHT HIP (WITH PELVIS IF PERFORMED) 2 VIEWS TECHNIQUE: Fluoroscopic spot image(s) were submitted for interpretation post-operatively. FLUOROSCOPY TIME:  Fluoroscopy Time: 32 seconds  Radiation Exposure Index: 1.66 mGy COMPARISON:  Right hip radiographs 04/29/2019 FINDINGS: Two intraoperative fluoroscopic images demonstrate performance of a right total hip arthroplasty. The prosthetic components appear normally located on these limited images, and no acute fracture is identified. IMPRESSION: Intraoperative images during right total hip arthroplasty. Electronically Signed   By: Logan Bores M.D.   On: 05/25/2019 08:56   DG HIP OPERATIVE UNILAT W OR W/O PELVIS RIGHT  Result Date: 05/25/2019 CLINICAL DATA:  Right total hip arthroplasty. EXAM: OPERATIVE RIGHT HIP (WITH PELVIS IF PERFORMED)  2 VIEWS TECHNIQUE: Fluoroscopic spot image(s) were submitted for interpretation post-operatively. FLUOROSCOPY TIME:  Fluoroscopy Time: 32 seconds Radiation Exposure Index: 1.66 mGy COMPARISON:  Right hip radiographs 04/29/2019 FINDINGS: Two intraoperative fluoroscopic images demonstrate performance of a right total hip arthroplasty. The prosthetic components appear normally located on these limited images, and no acute fracture is identified. IMPRESSION: Intraoperative images during right total hip arthroplasty. Electronically Signed   By: Logan Bores M.D.   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  Secondary degenerative joint disease   Disposition: Discharge disposition: 01-Home or Self Care         Follow-up Information    Leandrew Koyanagi, MD. Schedule an appointment as soon as possible for a visit in 2 week(s).   Specialty: Orthopedic Surgery Contact information: [address] [place] [address] [phone]            Signed: Aundra Dubin 05/26/2019, 8:19 AM

## 2019-05-27 ENCOUNTER — Telehealth: Payer: Self-pay | Admitting: Orthopaedic Surgery

## 2019-05-27 NOTE — Telephone Encounter (Signed)
Spoke to [facility] about patient. He states they will call patient today. Called patient to advise.

## 2019-05-27 NOTE — Telephone Encounter (Signed)
Patient called requesting concerning Physical therapy home health aid. Patient stated Dr. Erlinda Hong would have it set up and he hasn't received a call from anyone. Patient phone number is [phone]

## 2019-05-29 ENCOUNTER — Ambulatory Visit: Payer: Medicare Other | Admitting: Physical Therapy

## 2019-06-09 ENCOUNTER — Other Ambulatory Visit: Payer: Self-pay

## 2019-06-09 ENCOUNTER — Encounter: Payer: Self-pay | Admitting: Orthopaedic Surgery

## 2019-06-09 ENCOUNTER — Ambulatory Visit (INDEPENDENT_AMBULATORY_CARE_PROVIDER_SITE_OTHER): Payer: Medicare Other | Admitting: Orthopaedic Surgery

## 2019-06-09 VITALS — Ht 68.0 in | Wt 152.0 lb

## 2019-06-09 DIAGNOSIS — Z96641 Presence of right artificial hip joint: Secondary | ICD-10-CM

## 2019-06-09 NOTE — Progress Notes (Signed)
   Post-Op Visit Note   Patient: Gerald Carpenter           Date of Birth: 04/07/1950           MRN: [id number] Visit Date: 06/09/2019  PCP: Patient, No Pcp Per   Assessment & Plan:  Chief Complaint:  Chief Complaint  Patient presents with  . Right Hip - Routine Post Op    Right THA 05/25/2019   Visit Diagnoses:  1. Status post total replacement of right hip     Plan: Mr. Westland is 2 weeks status post right total hip placement.  He has finished home health PT.  He is ambulating with a single-point cane.  He is not taking any medications.  Sutures were removed today and Steri-Strips were placed.  He will continue with home exercises as prescribed by physical therapy.  Continue aspirin for DVT prophylaxis.  He will also resume his back exercises.  Overall he is doing well and happy with his outcome so far.  Recheck in 4 weeks with standing AP pelvis x-ray.  Follow-Up Instructions: Return in about 4 weeks (around 07/07/2019).   Orders:  No orders of the defined types were placed in this encounter.  No orders of the defined types were placed in this encounter.   Imaging: No results found.  PMFS History: Patient Active Problem List   Diagnosis Date Noted  . Status post total replacement of right hip 05/25/2019  . 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  . Lumbar radiculopathy 02/05/2019   Past Medical History:  Diagnosis Date  . Arthritis   . Nephritis    when pt. was 7 yrs. ols    History reviewed. No pertinent family history.  Past Surgical History:  Procedure Laterality Date  . ANTERIOR LAT LUMBAR FUSION Right 02/05/2019   Procedure: ATTEMPTED ANTERIOR LATERAL INTERBODY FUSION;  Surgeon: Consuella Lose, MD;  Location: [facility];  Service: Neurosurgery;  Laterality: Right;  RIGHT LATERAL TRANSPSOAS DISCECTOMY AND FUSION WITH ROBOTIC ASSISTED PEDICLE SCREW STABILIZATION, LUMBAR 4- LUMBAR 5  . COLONOSCOPY    . EYE SURGERY Bilateral 2013   lens implants for cataracts  after Lasik  . KNEE ARTHROSCOPY Right 1980  . TONSILLECTOMY     at a very young age, but grew back  . TOTAL HIP ARTHROPLASTY Right 05/25/2019   Procedure: RIGHT TOTAL HIP ARTHROPLASTY ANTERIOR APPROACH;  Surgeon: Leandrew Koyanagi, MD;  Location: [facility];  Service: Orthopedics;  Laterality: Right;   Social History   Occupational History  . Not on file  Tobacco Use  . Smoking status: Current Some Day Smoker    Packs/day: 0.50    Years: 60.00    Pack years: 30.00    Types: Cigarettes  . Smokeless tobacco: Never Used  Substance and Sexual Activity  . 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 Drug use: Not Currently    Comment: prior usage of "speed"  when drinking  . Sexual activity: Not on file

## 2019-07-07 ENCOUNTER — Ambulatory Visit (INDEPENDENT_AMBULATORY_CARE_PROVIDER_SITE_OTHER): Payer: Medicare Other

## 2019-07-07 ENCOUNTER — Ambulatory Visit (INDEPENDENT_AMBULATORY_CARE_PROVIDER_SITE_OTHER): Payer: Medicare Other | Admitting: Orthopaedic Surgery

## 2019-07-07 ENCOUNTER — Other Ambulatory Visit: Payer: Self-pay

## 2019-07-07 ENCOUNTER — Encounter: Payer: Self-pay | Admitting: Orthopaedic Surgery

## 2019-07-07 DIAGNOSIS — M1711 Unilateral primary osteoarthritis, right knee: Secondary | ICD-10-CM

## 2019-07-07 DIAGNOSIS — Z96641 Presence of right artificial hip joint: Secondary | ICD-10-CM

## 2019-07-07 MED ORDER — MELOXICAM 7.5 MG PO TABS: 7.5000 mg | ORAL_TABLET | Freq: Two times a day (BID) | ORAL | 2 refills | Status: DC | PRN

## 2019-07-07 NOTE — Progress Notes (Signed)
Office Visit Note   Patient: Gerald Carpenter           Date of Birth: May 20, 1950           MRN: [id number] Visit Date: 07/07/2019              Requested by: No referring provider defined for this encounter. PCP: Patient, No Pcp Per   Assessment & Plan: Visit Diagnoses:  1. Status post total replacement of right hip   2. Primary osteoarthritis of right knee     Plan: Impression is 6 weeks status post right total hip replacement and doing well.  In terms of his knee DJD he would like to start with some meloxicam.  I would like to recheck him in 6 weeks for his 52-month total hip visit.  Dental prophylaxis reinforced today.  Follow-Up Instructions: Return in about 6 weeks (around 08/18/2019).   Orders:  Orders Placed This Encounter  Procedures  . XR Pelvis 1-2 Views  . XR KNEE 3 VIEW RIGHT   Meds ordered this encounter  Medications  . meloxicam (MOBIC) 7.5 MG tablet    Sig: Take 1 tablet (7.5 mg total) by mouth 2 (two) times daily as needed for pain.    Dispense:  30 tablet    Refill:  2      Procedures: No procedures performed   Clinical Data: No additional findings.   Subjective: Chief Complaint  Patient presents with  . Right Hip - Pain  . Right Knee - Pain    Gerald Carpenter is 6 weeks status post right total hip replacement.  He is doing very well and is walking quite a bit.  He has been having increased pain in his right knee without mechanical symptoms.  He denies any swelling.  He is not interested in cortisone injections as he has a fear of needles.  He is very happy with his hip replacement so far.   Review of Systems  Constitutional: Negative.   All other systems reviewed and are negative.    Objective: Vital Signs: There were no vitals taken for this visit.  Physical Exam Vitals and nursing note reviewed.  Constitutional:      Appearance: He is well-developed.  Pulmonary:     Effort: Pulmonary effort is normal.  Abdominal:     Palpations: Abdomen  is soft.  Skin:    General: Skin is warm.  Neurological:     Mental Status: He is alert and oriented to person, place, and time.  Psychiatric:        Behavior: Behavior normal.        Thought Content: Thought content normal.        Judgment: Judgment normal.     Ortho Exam Right hip shows a fully healed surgical scar.  Minimal swelling.  Good range of motion. Right knee shows no joint effusion.  Collaterals and cruciates are stable.  Mild discomfort with range of motion. Specialty Comments:  No specialty comments available.  Imaging: XR KNEE 3 VIEW RIGHT  Result Date: 07/07/2019 Moderately severe right knee DJD  XR Pelvis 1-2 Views  Result Date: 07/07/2019 Stable total hip replacement without complications    PMFS History: Patient Active Problem List   Diagnosis Date Noted  . Primary osteoarthritis of right knee 07/07/2019  . Status post total replacement of right hip 05/25/2019  . 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  . Lumbar radiculopathy 02/05/2019   Past Medical History:  Diagnosis Date  . Arthritis   .  Nephritis    when pt. was 7 yrs. ols    No family history on file.  Past Surgical History:  Procedure Laterality Date  . ANTERIOR LAT LUMBAR FUSION Right 02/05/2019   Procedure: ATTEMPTED ANTERIOR LATERAL INTERBODY FUSION;  Surgeon: Consuella Lose, MD;  Location: [facility];  Service: Neurosurgery;  Laterality: Right;  RIGHT LATERAL TRANSPSOAS DISCECTOMY AND FUSION WITH ROBOTIC ASSISTED PEDICLE SCREW STABILIZATION, LUMBAR 4- LUMBAR 5  . COLONOSCOPY    . EYE SURGERY Bilateral 2013   lens implants for cataracts after Lasik  . KNEE ARTHROSCOPY Right 1980  . TONSILLECTOMY     at a very young age, but grew back  . TOTAL HIP ARTHROPLASTY Right 05/25/2019   Procedure: RIGHT TOTAL HIP ARTHROPLASTY ANTERIOR APPROACH;  Surgeon: Leandrew Koyanagi, MD;  Location: [facility];  Service: Orthopedics;  Laterality: Right;   Social History   Occupational History  . Not on  file  Tobacco Use  . Smoking status: Current Some Day Smoker    Packs/day: 0.50    Years: 60.00    Pack years: 30.00    Types: Cigarettes  . Smokeless tobacco: Never Used  Substance and Sexual Activity  . 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 Drug use: Not Currently    Comment: prior usage of "speed"  when drinking  . Sexual activity: Not on file

## 2019-07-31 ENCOUNTER — Ambulatory Visit: Payer: Self-pay

## 2019-07-31 ENCOUNTER — Other Ambulatory Visit: Payer: Self-pay

## 2019-07-31 ENCOUNTER — Encounter: Payer: Self-pay | Admitting: Orthopaedic Surgery

## 2019-07-31 ENCOUNTER — Ambulatory Visit (INDEPENDENT_AMBULATORY_CARE_PROVIDER_SITE_OTHER): Payer: Medicare Other | Admitting: Orthopaedic Surgery

## 2019-07-31 DIAGNOSIS — M79672 Pain in left foot: Secondary | ICD-10-CM

## 2019-07-31 DIAGNOSIS — M1711 Unilateral primary osteoarthritis, right knee: Secondary | ICD-10-CM | POA: Diagnosis not present

## 2019-07-31 DIAGNOSIS — M79671 Pain in right foot: Secondary | ICD-10-CM | POA: Diagnosis not present

## 2019-07-31 MED ORDER — METHYLPREDNISOLONE ACETATE 40 MG/ML IJ SUSP
40.0000 mg | INTRAMUSCULAR | Status: AC | PRN
Start: 2019-07-31 — End: 2019-07-31
  Administered 2019-07-31: 40 mg

## 2019-07-31 MED ORDER — LIDOCAINE HCL 1 % IJ SOLN
2.0000 mL | INTRAMUSCULAR | Status: AC | PRN
  Administered 2019-07-31: 2 mL

## 2019-07-31 MED ORDER — LIDOCAINE HCL 1 % IJ SOLN
1.0000 mL | INTRAMUSCULAR | Status: AC | PRN
  Administered 2019-07-31: 1 mL

## 2019-07-31 MED ORDER — BUPIVACAINE HCL 0.5 % IJ SOLN
2.0000 mL | INTRAMUSCULAR | Status: AC | PRN
  Administered 2019-07-31: 2 mL via INTRA_ARTICULAR

## 2019-07-31 MED ORDER — METHYLPREDNISOLONE ACETATE 40 MG/ML IJ SUSP
40.0000 mg | INTRAMUSCULAR | Status: AC | PRN
  Administered 2019-07-31: 40 mg

## 2019-07-31 MED ORDER — METHYLPREDNISOLONE ACETATE 40 MG/ML IJ SUSP
40.0000 mg | INTRAMUSCULAR | Status: AC | PRN
  Administered 2019-07-31: 40 mg via INTRA_ARTICULAR

## 2019-07-31 MED ORDER — BUPIVACAINE HCL 0.5 % IJ SOLN
1.0000 mL | INTRAMUSCULAR | Status: AC | PRN
  Administered 2019-07-31: 1 mL

## 2019-07-31 NOTE — Progress Notes (Signed)
Office Visit Note   Patient: Gerald Carpenter           Date of Birth: 10/30/1950           MRN: [id number] Visit Date: 07/31/2019              Requested by: No referring provider defined for this encounter. PCP: Patient, No Pcp Per   Assessment & Plan: Visit Diagnoses:  1. Pain in both feet     Plan: Impression is right knee DJD and bilateral Morton's neuroma.  Based on our discussion of treatment options he agreed to undergo cortisone injections for each condition.  He tolerated each injection very well.  We will see him back as needed.  Follow-Up Instructions: Return if symptoms worsen or fail to improve.   Orders:  Orders Placed This Encounter  Procedures  . XR Foot Complete Right  . XR Foot Complete Left   No orders of the defined types were placed in this encounter.     Procedures: Large Joint Inj: R knee on 07/31/2019 12:53 PM Indications: pain Details: 22 G needle  Arthrogram: No  Medications: 40 mg methylPREDNISolone acetate 40 MG/ML; 2 mL lidocaine 1 %; 2 mL bupivacaine 0.5 % Consent was given by the patient. Patient was prepped and draped in the usual sterile fashion.   Foot Inj  Date/Time: 07/31/2019 12:53 PM Performed by: Leandrew Koyanagi, MD Authorized by: Leandrew Koyanagi, MD   Indications:  Neuroma Condition: Morton's Neuroma   Location:  L foot Needle Size:  25 G Medications:  1 mL lidocaine 1 %; 1 mL bupivacaine 0.5 %; 40 mg methylPREDNISolone acetate 40 MG/ML Foot Inj  Date/Time: 07/31/2019 12:53 PM Performed by: Leandrew Koyanagi, MD Authorized by: Leandrew Koyanagi, MD   Indications:  Neuroma Condition: Morton's Neuroma   Location:  R foot Needle Size:  25 G Medications:  1 mL lidocaine 1 %; 1 mL bupivacaine 0.5 %; 40 mg methylPREDNISolone acetate 40 MG/ML     Clinical Data: No additional findings.   Subjective: Chief Complaint  Patient presents with  . Right Hip - Pain    Gerald Carpenter is a 69 year old gentleman who is well-known to me for  recent right hip replacement comes in for evaluation of separate issues.  He has bilateral foot pain as well as continued right knee pain.  He has known DJD of the right knee.  In terms of his feet he saw podiatrist a few weeks ago and he states that he has had trouble walking on his feet is very sore and it feels like he is walking on rocks.  He did get an injection but this was very temporary.   Review of Systems  Constitutional: Negative.   All other systems reviewed and are negative.    Objective: Vital Signs: There were no vitals taken for this visit.  Physical Exam Vitals and nursing note reviewed.  Constitutional:      Appearance: He is well-developed.  Pulmonary:     Effort: Pulmonary effort is normal.  Abdominal:     Palpations: Abdomen is soft.  Skin:    General: Skin is warm.  Neurological:     Mental Status: He is alert and oriented to person, place, and time.  Psychiatric:        Behavior: Behavior normal.        Thought Content: Thought content normal.        Judgment: Judgment normal.     Ortho Exam Right  knee exam shows no joint effusion.  Exam is unchanged since last visit. Bilateral feet show tenderness in the second intermetatarsal space more so on the plantar side.  No neurovascular deficits. Specialty Comments:  No specialty comments available.  Imaging: XR Foot Complete Left  Result Date: 07/31/2019 No acute or structural abnormalities.  Mild hallux rigidus  XR Foot Complete Right  Result Date: 07/31/2019 No acute or structural abnormalities.  Mild hallux rigidus    PMFS History: Patient Active Problem List   Diagnosis Date Noted  . Pain in both feet 07/31/2019  . Primary osteoarthritis of right knee 07/07/2019  . Status post total replacement of right hip 05/25/2019  . 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  . Lumbar radiculopathy 02/05/2019   Past Medical History:  Diagnosis Date  . Arthritis   . Nephritis    when pt. was 7  yrs. ols    History reviewed. No pertinent family history.  Past Surgical History:  Procedure Laterality Date  . ANTERIOR LAT LUMBAR FUSION Right 02/05/2019   Procedure: ATTEMPTED ANTERIOR LATERAL INTERBODY FUSION;  Surgeon: Consuella Lose, MD;  Location: [facility];  Service: Neurosurgery;  Laterality: Right;  RIGHT LATERAL TRANSPSOAS DISCECTOMY AND FUSION WITH ROBOTIC ASSISTED PEDICLE SCREW STABILIZATION, LUMBAR 4- LUMBAR 5  . COLONOSCOPY    . EYE SURGERY Bilateral 2013   lens implants for cataracts after Lasik  . KNEE ARTHROSCOPY Right 1980  . TONSILLECTOMY     at a very young age, but grew back  . TOTAL HIP ARTHROPLASTY Right 05/25/2019   Procedure: RIGHT TOTAL HIP ARTHROPLASTY ANTERIOR APPROACH;  Surgeon: Leandrew Koyanagi, MD;  Location: [facility];  Service: Orthopedics;  Laterality: Right;

## 2019-08-18 ENCOUNTER — Ambulatory Visit: Payer: Medicare Other | Admitting: Orthopaedic Surgery

## 2019-08-19 ENCOUNTER — Telehealth: Payer: Self-pay

## 2019-08-19 NOTE — Telephone Encounter (Signed)
Patient called in wanting to speak about injections and also wanted to re sch appt

## 2019-08-20 ENCOUNTER — Telehealth: Payer: Self-pay | Admitting: Orthopaedic Surgery

## 2019-08-20 NOTE — Telephone Encounter (Signed)
Patient called.   He is requesting a call back for advice. Said the injections did not help and he doesn't know what else to do.   Call back: [phone]

## 2019-08-21 NOTE — Telephone Encounter (Signed)
Appointment made for patient.

## 2019-08-21 NOTE — Telephone Encounter (Signed)
Should come in for another appointment

## 2019-08-27 ENCOUNTER — Ambulatory Visit (INDEPENDENT_AMBULATORY_CARE_PROVIDER_SITE_OTHER): Payer: Medicare Other | Admitting: Orthopaedic Surgery

## 2019-08-27 ENCOUNTER — Encounter: Payer: Self-pay | Admitting: Orthopaedic Surgery

## 2019-08-27 ENCOUNTER — Telehealth: Payer: Self-pay

## 2019-08-27 ENCOUNTER — Other Ambulatory Visit: Payer: Self-pay

## 2019-08-27 VITALS — Ht 68.0 in | Wt 152.0 lb

## 2019-08-27 DIAGNOSIS — M1711 Unilateral primary osteoarthritis, right knee: Secondary | ICD-10-CM | POA: Diagnosis not present

## 2019-08-27 DIAGNOSIS — M79672 Pain in left foot: Secondary | ICD-10-CM

## 2019-08-27 DIAGNOSIS — M79671 Pain in right foot: Secondary | ICD-10-CM | POA: Diagnosis not present

## 2019-08-27 NOTE — Progress Notes (Signed)
Office Visit Note   Patient: Gerald Carpenter           Date of Birth: 1951/02/12           MRN: [id number] Visit Date: 08/27/2019              Requested by: No referring provider defined for this encounter. PCP: Patient, No Pcp Per   Assessment & Plan: Visit Diagnoses:  1. Primary osteoarthritis of right knee   2. Pain in both feet     Plan: For the right knee we will submit his insurance for Visco injection since the cortisone injections have not helped significantly.  For the feet we will make a referral to Dr. Sharol Carpenter for further evaluation.  Follow-up for the Visco injection.  Follow-Up Instructions: Return for needs appt with Dr. Sharol Carpenter for evaluation of bilateral foot pain.   Orders:  No orders of the defined types were placed in this encounter.  No orders of the defined types were placed in this encounter.     Procedures: No procedures performed   Clinical Data: No additional findings.   Subjective: Chief Complaint  Patient presents with  . Right Knee - Follow-up  . Right Foot - Follow-up  . Left Foot - Follow-up    Gerald Carpenter is returning today for continued bilateral foot pain and right knee pain.  Overall the feet hurt worse.  The injections that we provided last time did not help.  This is also affecting his ability to walk and his right knee pain.   Review of Systems   Objective: Vital Signs: Ht 5\' 8"  (1.727 m)   Wt 152 lb (68.9 kg)   BMI 23.11 kg/m   Physical Exam  Ortho Exam Exams are unchanged. Specialty Comments:  No specialty comments available.  Imaging: No results found.   PMFS History: Patient Active Problem List   Diagnosis Date Noted  . Pain in both feet 07/31/2019  . Primary osteoarthritis of right knee 07/07/2019  . Status post total replacement of right hip 05/25/2019  . 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  . Lumbar radiculopathy 02/05/2019   Past Medical History:  Diagnosis Date  . Arthritis   . Nephritis      when pt. was 7 yrs. ols    History reviewed. No pertinent family history.  Past Surgical History:  Procedure Laterality Date  . ANTERIOR LAT LUMBAR FUSION Right 02/05/2019   Procedure: ATTEMPTED ANTERIOR LATERAL INTERBODY FUSION;  Surgeon: Consuella Lose, MD;  Location: [facility];  Service: Neurosurgery;  Laterality: Right;  RIGHT LATERAL TRANSPSOAS DISCECTOMY AND FUSION WITH ROBOTIC ASSISTED PEDICLE SCREW STABILIZATION, LUMBAR 4- LUMBAR 5  . COLONOSCOPY    . EYE SURGERY Bilateral 2013   lens implants for cataracts after Lasik  . KNEE ARTHROSCOPY Right 1980  . TONSILLECTOMY     at a very young age, but grew back  . TOTAL HIP ARTHROPLASTY Right 05/25/2019   Procedure: RIGHT TOTAL HIP ARTHROPLASTY ANTERIOR APPROACH;  Surgeon: Leandrew Koyanagi, MD;  Location: [facility];  Service: Orthopedics;  Laterality: Right;   Social History   Occupational History  . Not on file  Tobacco Use  . Smoking status: Current Some Day Smoker    Packs/day: 0.50    Years: 60.00    Pack years: 30.00    Types: Cigarettes  . Smokeless tobacco: Never Used  Vaping Use  . Vaping Use: Never used  Substance and Sexual Activity  . 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 Drug use: Not Currently    Comment: prior usage of "speed"  when drinking  . Sexual activity: Not on file

## 2019-08-27 NOTE — Telephone Encounter (Signed)
Please precert for right knee synvisc injection. This is Dr.Xu's computer.

## 2019-08-28 ENCOUNTER — Telehealth: Payer: Self-pay

## 2019-08-28 NOTE — Telephone Encounter (Signed)
Noted  

## 2019-08-28 NOTE — Telephone Encounter (Signed)
Submitted VOB, Gel-One, right knee.

## 2019-09-01 ENCOUNTER — Encounter: Payer: Self-pay | Admitting: Orthopedic Surgery

## 2019-09-01 ENCOUNTER — Other Ambulatory Visit: Payer: Self-pay

## 2019-09-01 ENCOUNTER — Ambulatory Visit (INDEPENDENT_AMBULATORY_CARE_PROVIDER_SITE_OTHER): Payer: Medicare Other | Admitting: Orthopedic Surgery

## 2019-09-01 VITALS — Ht 68.0 in | Wt 152.0 lb

## 2019-09-01 DIAGNOSIS — M6702 Short Achilles tendon (acquired), left ankle: Secondary | ICD-10-CM

## 2019-09-01 DIAGNOSIS — M6701 Short Achilles tendon (acquired), right ankle: Secondary | ICD-10-CM | POA: Diagnosis not present

## 2019-09-01 DIAGNOSIS — M205X9 Other deformities of toe(s) (acquired), unspecified foot: Secondary | ICD-10-CM

## 2019-09-10 ENCOUNTER — Encounter: Payer: Self-pay | Admitting: Orthopedic Surgery

## 2019-09-10 NOTE — Progress Notes (Signed)
Office Visit Note   Patient: Gerald Carpenter           Date of Birth: August 12, 1950           MRN: [id number] Visit Date: 09/01/2019              Requested by: No referring provider defined for this encounter. PCP: Patient, No Pcp Per  Chief Complaint  Patient presents with  . Right Foot - Pain, New Patient (Initial Visit)  . Left Foot - Pain      HPI: Patient is a 69 year old gentleman who is seen for initial evaluation for bilateral foot pain.  Patient complains of burning pain beneath the ball of his foot and into his toes.  Patient states that he is status post back surgery and hip surgery and states that he needs knee surgery but is currently undergoing injections.  He states he has an injection 2 weeks ago on both feet which does not help.  Patient states that after walking in a [place] mile he has increased pain across the forefoot.  Patient states he has swelling beneath the ball of his foot.  Assessment & Plan: Visit Diagnoses:  1. Achilles tendon contracture, bilateral   2. Claw toe, acquired, unspecified laterality     Plan: Patient has significant Achilles contracture he was given instructions and demonstrated Achilles stretching.  He is currently smoking and smoking cessation was discussed.  Recommended a stiff soled sneaker to unload pressure from the forefoot.  Patient may require a gastrocnemius recession if he cannot stretch out the Achilles on his own.  Follow-Up Instructions: Return in about 4 weeks (around 09/29/2019).   Ortho Exam  Patient is alert, oriented, no adenopathy, well-dressed, normal affect, normal respiratory effort. Examination patient has negative sciatic tension sign bilaterally with his knee extended he has significant Achilles contracture with dorsiflexion 10 degrees short of neutral bilaterally he does have clawing of the toes with distal migration of the fat pad.  Radiographs shows a long second and third and fourth metatarsal bilaterally which  contributes to overloading on the forefoot.  Imaging: No results found. No images are attached to the encounter.  Labs: No results found for: HGBA1C, ESRSEDRATE, CRP, LABURIC, REPTSTATUS, GRAMSTAIN, CULT, LABORGA   Lab Results  Component Value Date   ALBUMIN 4.2 05/21/2019    No results found for: MG No results found for: VD25OH  No results found for: PREALBUMIN CBC EXTENDED Latest Ref Rng & Units 05/26/2019 05/21/2019 02/11/2019  WBC 4.0 - 10.5 K/uL 17.2(H) 16.5(H) 23.7(H)  RBC 4.22 - 5.81 MIL/uL 3.96(L) 5.51 4.51  HGB 13.0 - 17.0 g/dL 12.4(L) 17.0 14.1  HCT 39 - 52 % 38.1(L) 52.8(H) 42.8  PLT 150 - 400 K/uL 150 224 222  NEUTROABS 1.7 - 7.7 K/uL - 10.2(H) -  LYMPHSABS 0.7 - 4.0 K/uL - 3.0 -     Body mass index is 23.11 kg/m.  Orders:  No orders of the defined types were placed in this encounter.  No orders of the defined types were placed in this encounter.    Procedures: No procedures performed  Clinical Data: No additional findings.  ROS:  All other systems negative, except as noted in the HPI. Review of Systems  Objective: Vital Signs: Ht 5\' 8"  (1.727 m)   Wt 152 lb (68.9 kg)   BMI 23.11 kg/m   Specialty Comments:  No specialty comments available.  PMFS History: Patient Active Problem List   Diagnosis Date Noted  . Pain  in both feet 07/31/2019  . Primary osteoarthritis of right knee 07/07/2019  . Status post total replacement of right hip 05/25/2019  . 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  . Lumbar radiculopathy 02/05/2019   Past Medical History:  Diagnosis Date  . Arthritis   . Nephritis    when pt. was 7 yrs. ols    History reviewed. No pertinent family history.  Past Surgical History:  Procedure Laterality Date  . ANTERIOR LAT LUMBAR FUSION Right 02/05/2019   Procedure: ATTEMPTED ANTERIOR LATERAL INTERBODY FUSION;  Surgeon: Consuella Lose, MD;  Location: [facility];  Service: Neurosurgery;  Laterality: Right;  RIGHT LATERAL  TRANSPSOAS DISCECTOMY AND FUSION WITH ROBOTIC ASSISTED PEDICLE SCREW STABILIZATION, LUMBAR 4- LUMBAR 5  . COLONOSCOPY    . EYE SURGERY Bilateral 2013   lens implants for cataracts after Lasik  . KNEE ARTHROSCOPY Right 1980  . TONSILLECTOMY     at a very young age, but grew back  . TOTAL HIP ARTHROPLASTY Right 05/25/2019   Procedure: RIGHT TOTAL HIP ARTHROPLASTY ANTERIOR APPROACH;  Surgeon: Leandrew Koyanagi, MD;  Location: [facility];  Service: Orthopedics;  Laterality: Right;   Social History   Occupational History  . Not on file  Tobacco Use  . Smoking status: Current Some Day Smoker    Packs/day: 0.50    Years: 60.00    Pack years: 30.00    Types: Cigarettes  . Smokeless tobacco: Never Used  Vaping Use  . Vaping Use: Never used  Substance and Sexual Activity  . 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 Drug use: Not Currently    Comment: prior usage of "speed"  when drinking  . Sexual activity: Not on file

## 2019-09-11 ENCOUNTER — Telehealth: Payer: Self-pay

## 2019-09-11 NOTE — Telephone Encounter (Signed)
Approved, Gel-One, right knee. Buy & Bill Patient's secondary insurance will cover Part B coinsurance No Co-pay No PA required

## 2019-09-22 ENCOUNTER — Telehealth: Payer: Self-pay | Admitting: Orthopedic Surgery

## 2019-09-22 ENCOUNTER — Encounter: Payer: Self-pay | Admitting: Orthopaedic Surgery

## 2019-09-22 ENCOUNTER — Ambulatory Visit (INDEPENDENT_AMBULATORY_CARE_PROVIDER_SITE_OTHER): Payer: Medicare Other | Admitting: Orthopaedic Surgery

## 2019-09-22 VITALS — Ht 68.0 in | Wt 155.0 lb

## 2019-09-22 DIAGNOSIS — M1711 Unilateral primary osteoarthritis, right knee: Secondary | ICD-10-CM | POA: Diagnosis not present

## 2019-09-22 MED ORDER — CROSS-LINK HYAL ACID (VISC) 30 MG/3ML IX PRSY
30.0000 mg | PREFILLED_SYRINGE | INTRA_ARTICULAR | Status: AC | PRN
  Administered 2019-09-22: 30 mg via INTRA_ARTICULAR

## 2019-09-22 NOTE — Telephone Encounter (Signed)
Pt would like to know if he should keep his appt for 10/01/19 even though he hasn't been able to do all the exercises? Pt would like a CB  [phone]

## 2019-09-22 NOTE — Telephone Encounter (Signed)
I called and lm on vm to advise that he can keep his appt and we can eval where his is doing the activity that he has been able to do OR he can call and resch and give himself a few more weeks to see if he is able to do all of the stretching its up to him what he would like to do there is no right or wrong answer we are happy to see him at any time.

## 2019-09-22 NOTE — Progress Notes (Signed)
   Procedure Note  Patient: Gerald Carpenter             Date of Birth: 05-Jan-1951           MRN: [id number]             Visit Date: 09/22/2019  Procedures: Visit Diagnoses:  1. Primary osteoarthritis of right knee     Large Joint Inj: R knee on 09/22/2019 8:35 AM Indications: pain Details: 22 G needle  Arthrogram: No  Medications: 30 mg Cross-Linked Hyaluronate 30 MG/3ML Outcome: tolerated well, no immediate complications Patient was prepped and draped in the usual sterile fashion.

## 2019-10-01 ENCOUNTER — Ambulatory Visit (INDEPENDENT_AMBULATORY_CARE_PROVIDER_SITE_OTHER): Payer: Medicare Other | Admitting: Physician Assistant

## 2019-10-01 ENCOUNTER — Encounter: Payer: Self-pay | Admitting: Orthopedic Surgery

## 2019-10-01 VITALS — Ht 68.0 in | Wt 155.0 lb

## 2019-10-01 DIAGNOSIS — M6701 Short Achilles tendon (acquired), right ankle: Secondary | ICD-10-CM | POA: Diagnosis not present

## 2019-10-01 DIAGNOSIS — M6702 Short Achilles tendon (acquired), left ankle: Secondary | ICD-10-CM | POA: Diagnosis not present

## 2019-10-01 NOTE — Progress Notes (Signed)
Office Visit Note   Patient: Gerald Carpenter           Date of Birth: 06-20-1950           MRN: [id number] Visit Date: 10/01/2019              Requested by: No referring provider defined for this encounter. PCP: Patient, No Pcp Per  Chief Complaint  Patient presents with  . Left Foot - Follow-up  . Right Foot - Follow-up      HPI: This is a pleasant 69 year old gentleman with a history of bilateral Achilles contractures.  At his last visit he was demonstrated Achilles stretching.  He said he started to do this but began having right knee pain.  He has had a history of chronic not right knee pain.  He did see Dr. Tomie China who gave him a steroid injection.  Most recently he had viscosupplementation and feels he is doing a little bit better.  He has been able to gradually increase the times a day he does the stretching and feels it will help now that he can do it on a regular basis  Assessment & Plan: Visit Diagnoses: No diagnosis found.  Plan: Continue with stretching.  We talked about different types of exercises and getting his leg stronger in a gym.  He is considering getting a Physiological scientist.  Follow-up in 1 month  Follow-Up Instructions: No follow-ups on file.   Ortho Exam  Patient is alert, oriented, no adenopathy, well-dressed, normal affect, normal respiratory effort. Lower legs no swelling no cellulitis no ulcers no skin breakdown.  Bilaterally he has dorsiflexion to neutral or just past neutral.  He has some mild tenderness in the right Achilles at the tendinous junction not much on the left.  Compartments are soft and compressible  Imaging: No results found. No images are attached to the encounter.  Labs: No results found for: HGBA1C, ESRSEDRATE, CRP, LABURIC, REPTSTATUS, GRAMSTAIN, CULT, LABORGA   Lab Results  Component Value Date   ALBUMIN 4.2 05/21/2019    No results found for: MG No results found for: VD25OH  No results found for: PREALBUMIN CBC EXTENDED  Latest Ref Rng & Units 05/26/2019 05/21/2019 02/11/2019  WBC 4.0 - 10.5 K/uL 17.2(H) 16.5(H) 23.7(H)  RBC 4.22 - 5.81 MIL/uL 3.96(L) 5.51 4.51  HGB 13.0 - 17.0 g/dL 12.4(L) 17.0 14.1  HCT 39 - 52 % 38.1(L) 52.8(H) 42.8  PLT 150 - 400 K/uL 150 224 222  NEUTROABS 1.7 - 7.7 K/uL - 10.2(H) -  LYMPHSABS 0.7 - 4.0 K/uL - 3.0 -     Body mass index is 23.57 kg/m.  Orders:  No orders of the defined types were placed in this encounter.  No orders of the defined types were placed in this encounter.    Procedures: No procedures performed  Clinical Data: No additional findings.  ROS:  All other systems negative, except as noted in the HPI. Review of Systems  Objective: Vital Signs: Ht 5\' 8"  (1.727 m)   Wt 155 lb (70.3 kg)   BMI 23.57 kg/m   Specialty Comments:  No specialty comments available.  PMFS History: Patient Active Problem List   Diagnosis Date Noted  . Pain in both feet 07/31/2019  . Primary osteoarthritis of right knee 07/07/2019  . Status post total replacement of right hip 05/25/2019  . 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  . Lumbar radiculopathy 02/05/2019   Past Medical History:  Diagnosis Date  . Arthritis   .  Nephritis    when pt. was 7 yrs. ols    No family history on file.  Past Surgical History:  Procedure Laterality Date  . ANTERIOR LAT LUMBAR FUSION Right 02/05/2019   Procedure: ATTEMPTED ANTERIOR LATERAL INTERBODY FUSION;  Surgeon: Consuella Lose, MD;  Location: [facility];  Service: Neurosurgery;  Laterality: Right;  RIGHT LATERAL TRANSPSOAS DISCECTOMY AND FUSION WITH ROBOTIC ASSISTED PEDICLE SCREW STABILIZATION, LUMBAR 4- LUMBAR 5  . COLONOSCOPY    . EYE SURGERY Bilateral 2013   lens implants for cataracts after Lasik  . KNEE ARTHROSCOPY Right 1980  . TONSILLECTOMY     at a very young age, but grew back  . TOTAL HIP ARTHROPLASTY Right 05/25/2019   Procedure: RIGHT TOTAL HIP ARTHROPLASTY ANTERIOR APPROACH;  Surgeon: Leandrew Koyanagi, MD;   Location: [facility];  Service: Orthopedics;  Laterality: Right;   Social History   Occupational History  . Not on file  Tobacco Use  . Smoking status: Current Some Day Smoker    Packs/day: 0.50    Years: 60.00    Pack years: 30.00    Types: Cigarettes  . Smokeless tobacco: Never Used  Vaping Use  . Vaping Use: Never used  Substance and Sexual Activity  . 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 Drug use: Not Currently    Comment: prior usage of "speed"  when drinking  . Sexual activity: Not on file

## 2019-10-30 ENCOUNTER — Ambulatory Visit: Payer: Medicare Other | Admitting: Orthopaedic Surgery

## 2019-11-05 ENCOUNTER — Ambulatory Visit: Payer: Medicare Other | Admitting: Orthopedic Surgery

## 2020-02-16 ENCOUNTER — Other Ambulatory Visit: Payer: Self-pay | Admitting: *Deleted

## 2020-02-16 DIAGNOSIS — M79672 Pain in left foot: Secondary | ICD-10-CM

## 2020-02-16 DIAGNOSIS — M79671 Pain in right foot: Secondary | ICD-10-CM

## 2020-03-01 ENCOUNTER — Ambulatory Visit (HOSPITAL_COMMUNITY)
Admission: RE | Admit: 2020-03-01 | Discharge: 2020-03-01 | Disposition: A | Discharge status: Home / Self Care | Payer: Medicare Other | Source: Ambulatory Visit | Attending: Vascular Surgery | Admitting: Vascular Surgery

## 2020-03-01 ENCOUNTER — Other Ambulatory Visit: Payer: Self-pay

## 2020-03-01 ENCOUNTER — Ambulatory Visit (INDEPENDENT_AMBULATORY_CARE_PROVIDER_SITE_OTHER): Payer: Medicare Other | Admitting: Vascular Surgery

## 2020-03-01 ENCOUNTER — Encounter: Payer: Self-pay | Admitting: Vascular Surgery

## 2020-03-01 DIAGNOSIS — I70213 Atherosclerosis of native arteries of extremities with intermittent claudication, bilateral legs: Secondary | ICD-10-CM | POA: Diagnosis not present

## 2020-03-01 DIAGNOSIS — M79672 Pain in left foot: Secondary | ICD-10-CM | POA: Diagnosis present

## 2020-03-01 DIAGNOSIS — M79671 Pain in right foot: Secondary | ICD-10-CM | POA: Diagnosis present

## 2020-03-01 DIAGNOSIS — I70219 Atherosclerosis of native arteries of extremities with intermittent claudication, unspecified extremity: Secondary | ICD-10-CM | POA: Insufficient documentation

## 2020-03-01 MED ORDER — CHANTIX STARTING MONTH PAK 0.5 MG X 11 & 1 MG X 42 PO TABS: ORAL_TABLET | ORAL | 0 refills | Status: DC

## 2020-03-01 MED ORDER — VARENICLINE TARTRATE 1 MG PO TABS: 1.0000 mg | ORAL_TABLET | Freq: Two times a day (BID) | ORAL | 5 refills | Status: DC

## 2020-03-01 MED ORDER — CILOSTAZOL 100 MG PO TABS: 100.0000 mg | ORAL_TABLET | Freq: Two times a day (BID) | ORAL | 11 refills | Status: DC

## 2020-03-01 NOTE — Progress Notes (Signed)
Patient name: Gerald Carpenter MRN: [id number] DOB: 06-21-1950 Sex: male  REASON FOR CONSULT: Evaluate claudication of bilateral lower extremities  HPI: Gerald Carpenter is a 70 y.o. male, with history of tobacco abuse that presents for evaluation of lower extremity claudication.  Patient describes having problems with both lower extremities that has been ongoing for some time.  Usually after he walks anywhere from a 1000 feet to 0.25 mile he gets pain that requires him to stop. Pain is usually in the calf.  Generally this always gets better when he stops.  He does not have pain in his feet at night that awakens him from sleep.  He is smoking at least a pack a day.  States has had back surgery as well as hip surgery hoping to improve some of symptoms in his lower extremities with minimal relief.  No previous lower extremity revascularizations.    Past Medical History:  Diagnosis Date  . Arthritis   . Nephritis    when pt. was 7 yrs. ols    Past Surgical History:  Procedure Laterality Date  . ANTERIOR LAT LUMBAR FUSION Right 02/05/2019   Procedure: ATTEMPTED ANTERIOR LATERAL INTERBODY FUSION;  Surgeon: Consuella Lose, MD;  Location: [facility];  Service: Neurosurgery;  Laterality: Right;  RIGHT LATERAL TRANSPSOAS DISCECTOMY AND FUSION WITH ROBOTIC ASSISTED PEDICLE SCREW STABILIZATION, LUMBAR 4- LUMBAR 5  . COLONOSCOPY    . EYE SURGERY Bilateral 2013   lens implants for cataracts after Lasik  . KNEE ARTHROSCOPY Right 1980  . TONSILLECTOMY     at a very young age, but grew back  . TOTAL HIP ARTHROPLASTY Right 05/25/2019   Procedure: RIGHT TOTAL HIP ARTHROPLASTY ANTERIOR APPROACH;  Surgeon: Leandrew Koyanagi, MD;  Location: [facility];  Service: Orthopedics;  Laterality: Right;    Family History  Problem Relation Age of Onset  . Pulmonary fibrosis Mother   . Heart disease Father     SOCIAL HISTORY: Social History   Socioeconomic History  . Marital status: Married    Spouse name: Not on file   . Number of children: Not on file  . Years of education: Not on file  . Highest education level: Not on file  Occupational History  . Not on file  Tobacco Use  . Smoking status: Current Some Day Smoker    Packs/day: 0.50    Years: 60.00    Pack years: 30.00    Types: Cigarettes  . Smokeless tobacco: Never Used  Vaping Use  . Vaping Use: Never used  Substance and Sexual Activity  . 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 Drug use: Not Currently    Comment: prior usage of "speed"  when drinking  . Sexual activity: Not on file  Other Topics Concern  . Not on file  Social History Narrative   Recently moved here from [facility], [place]   Social Determinants of Health   Financial Resource Strain: Not on file  Food Insecurity: Not on file  Transportation Needs: Not on file  Physical Activity: Not on file  Stress: Not on file  Social Connections: Not on file  Intimate Partner Violence: Not on file    No Known Allergies  Current Outpatient Medications  Medication Sig Dispense Refill  . aspirin EC 81 MG tablet Take 1 tablet (81 mg total) by mouth 2 (two) times daily. (Patient not taking: Reported on 03/01/2020) 84 tablet 0  . docusate sodium (COLACE) 100  MG capsule Take 1 capsule (100 mg total) by mouth daily as needed. (Patient not taking: Reported on 03/01/2020) 30 capsule 2  . meloxicam (MOBIC) 7.5 MG tablet Take 1 tablet (7.5 mg total) by mouth 2 (two) times daily as needed for pain. (Patient not taking: Reported on 03/01/2020) 30 tablet 2  . methocarbamol (ROBAXIN) 500 MG tablet Take 1 tablet (500 mg total) by mouth 2 (two) times daily as needed. (Patient not taking: Reported on 03/01/2020) 20 tablet 0  . ondansetron (ZOFRAN) 4 MG tablet Take 1 tablet (4 mg total) by mouth every 8 (eight) hours as needed for nausea or vomiting. (Patient not taking: Reported on 03/01/2020) 40 tablet 0  . oxyCODONE (OXYCONTIN) 10 mg 12 hr tablet Take 1 tablet (10 mg  total) by mouth every 12 (twelve) hours. (Patient not taking: Reported on 03/01/2020) 6 tablet 0  . oxyCODONE-acetaminophen (PERCOCET) 5-325 MG tablet Take 1-2 tablets by mouth every 6 (six) hours as needed for severe pain. (Patient not taking: Reported on 03/01/2020) 30 tablet 0   No current facility-administered medications for this visit.    REVIEW OF SYSTEMS:  [X]  denotes positive finding, [ ]  denotes negative finding Cardiac  Comments:  Chest pain or chest pressure:    Shortness of breath upon exertion:    Short of breath when lying flat:    Irregular heart rhythm:        Vascular    Pain in calf, thigh, or hip brought on by ambulation: x   Pain in feet at night that wakes you up from your sleep:     Blood clot in your veins:    Leg swelling:         Pulmonary    Oxygen at home:    Productive cough:     Wheezing:         Neurologic    Sudden weakness in arms or legs:     Sudden numbness in arms or legs:     Sudden onset of difficulty speaking or slurred speech:    Temporary loss of vision in one eye:     Problems with dizziness:         Gastrointestinal    Blood in stool:     Vomited blood:         Genitourinary    Burning when urinating:     Blood in urine:        Psychiatric    Major depression:         Hematologic    Bleeding problems:    Problems with blood clotting too easily:        Skin    Rashes or ulcers:        Constitutional    Fever or chills:      PHYSICAL EXAM: Vitals:   03/01/20 1139  BP: 111/73  Pulse: 89  Resp: 16  Temp: 98.2 F (36.8 C)  TempSrc: Temporal  SpO2: 97%  Weight: 144 lb (65.3 kg)  Height: 5\' 8"  (1.727 m)    GENERAL: The patient is a well-nourished male, in no acute distress. The vital signs are documented above. CARDIAC: There is a regular rate and rhythm.  VASCULAR:  Palpable femoral pulses both groins No palpable pedal pulses but feet are warm with no tissue loss PULMONARY: No respiratory distress. ABDOMEN:  Soft and non-tender. MUSCULOSKELETAL: There are no major deformities or cyanosis. NEUROLOGIC: No focal weakness or paresthesias are detected. SKIN: There are no ulcers or rashes  noted. PSYCHIATRIC: The patient has a normal affect.  DATA:   ABIs today are 0.7 on the right and 0.65 on the left  Assessment/Plan:  70 year old male with history of tobacco abuse that presents for evaluation of bilateral lower extremity claudication.  I discussed with him in detail that claudication is typically not a limb threatening situation and we try to manage this conservatively.  I discussed first the importance of smoking cessation and did prescribe Chantix with both the starter pack and maintenance pack.  I discussed walking therapies at least 3 times a week.  We also discussed Pletal to try to improve his walking distances.  I discussed that if ultimately fails conservative management we could reevaluate him for lower extremity intervention.  I will see him in 3 months.   [name], MD [facility] Office: [phone]

## 2020-03-03 ENCOUNTER — Other Ambulatory Visit: Payer: Self-pay

## 2020-03-03 ENCOUNTER — Telehealth: Payer: Self-pay

## 2020-03-03 MED ORDER — BUPROPION HCL ER (SR) 150 MG PO TB12
150.0000 mg | ORAL_TABLET | Freq: Two times a day (BID) | ORAL | 3 refills | Status: DC
Start: 2020-03-03 — End: 2020-06-25

## 2020-03-03 NOTE — Telephone Encounter (Signed)
Patient called to report Chantix has been recalled. He says patches and gum have been ineffective in his efforts to stop smoking. Discussed with MD and have called in Bupropion SR 150 dosing for smoking cessation. Patient aware.

## 2020-03-03 NOTE — Progress Notes (Unsigned)
b

## 2020-04-11 ENCOUNTER — Other Ambulatory Visit: Payer: Self-pay

## 2020-04-12 ENCOUNTER — Ambulatory Visit (INDEPENDENT_AMBULATORY_CARE_PROVIDER_SITE_OTHER): Payer: Medicare Other | Admitting: Family Medicine

## 2020-04-12 ENCOUNTER — Encounter: Payer: Self-pay | Admitting: Family Medicine

## 2020-04-12 VITALS — BP 115/68 | HR 104 | Temp 98.0°F | Ht 67.0 in | Wt 147.8 lb

## 2020-04-12 DIAGNOSIS — Z86010 Personal history of colonic polyps: Secondary | ICD-10-CM

## 2020-04-12 DIAGNOSIS — R350 Frequency of micturition: Secondary | ICD-10-CM

## 2020-04-12 DIAGNOSIS — I70213 Atherosclerosis of native arteries of extremities with intermittent claudication, bilateral legs: Secondary | ICD-10-CM

## 2020-04-12 DIAGNOSIS — I739 Peripheral vascular disease, unspecified: Secondary | ICD-10-CM | POA: Insufficient documentation

## 2020-04-12 DIAGNOSIS — Z23 Encounter for immunization: Secondary | ICD-10-CM | POA: Diagnosis not present

## 2020-04-12 DIAGNOSIS — F17210 Nicotine dependence, cigarettes, uncomplicated: Secondary | ICD-10-CM | POA: Diagnosis not present

## 2020-04-12 DIAGNOSIS — F17200 Nicotine dependence, unspecified, uncomplicated: Secondary | ICD-10-CM | POA: Insufficient documentation

## 2020-04-12 DIAGNOSIS — R399 Unspecified symptoms and signs involving the genitourinary system: Secondary | ICD-10-CM

## 2020-04-12 DIAGNOSIS — F17201 Nicotine dependence, unspecified, in remission: Secondary | ICD-10-CM | POA: Insufficient documentation

## 2020-04-12 MED ORDER — TAMSULOSIN HCL 0.4 MG PO CAPS: 0.4000 mg | ORAL_CAPSULE | Freq: Every day | ORAL | 3 refills | Status: DC

## 2020-04-12 NOTE — Progress Notes (Signed)
[facility] [address] [place] [address] Dept: [phone] Dept Fax: [phone]  New Patient Office Visit  Subjective:    Patient ID: Gerald Carpenter, male    DOB: September 17, 1950, 70 y.o..   MRN: [id number]  Chief Complaint  Patient presents with  . Establish Care    New patient/establish care, concerns about possible enlarge prostate. Allergy to white bread or gluten.    History of Present Illness:  Patient is in today to establish care. Gerald Carpenter moved to [facility] from [facility], [place] about 18 months ago. He is married and has three children. He is retired from doing Orthoptist.  Gerald Carpenter has a history of a lumbar radiculopathy and is s/p lumbar fusion in Dec. 2020. He still has some back discomfort, but feels this is gradually improving.   He has a history of bilateral hip and knee osteoarthritis. He had a right hip TJR in Apr. 2021. This has done well.  Gerald Carpenter has bilateral foot pain. He is currently being seen in a foot clinic. It appears their working diagnosis is metatarsalgia.  Gerald Carpenter was recently seen by Dr. Carlis Abbott (Vascular) and diagnoses with PAD with claudication. He is not on Pletal and feels it has helped a small amount. Gerald Carpenter has a long-standing smoking history, having started at age 46 years old when his grandfather allowed him to roll his own cigarettes and smoke them. He had some gap in smoking in childhood, but started smoking daily at about age 32. He had been smoking as much as 2 ppd 3 years ago. He is now down to a little less than 1 ppd. He quit for a year at one point through the use of nicotine patches. He was more recently prescribed bupropion, but doesn't feel this is helping. He plans ot use OTC nicotine patches and is searching for a source.  Gerald Carpenter notes a gradually progressive issue with urinary frequency, a poor stream, and a sensation of incomplete bladder emptying. He notes he  gets up to urinate 2-3 times a night.  Past Medical History: Patient Active Problem List   Diagnosis Date Noted  . Peripheral vascular disease ([place]) 04/12/2020  . Tobacco dependence due to cigarettes 04/12/2020  . Lower urinary tract symptoms (LUTS) 04/12/2020  . History of colon polyps 04/12/2020  . Atherosclerosis of native arteries of extremity with intermittent claudication ([place]) 03/01/2020  . Pain in both feet 07/31/2019  . Primary osteoarthritis of right knee 07/07/2019  . Status post total replacement of right hip 05/25/2019  . Lumbar radiculopathy 02/05/2019   Past Surgical History:  Procedure Laterality Date  . ANTERIOR LAT LUMBAR FUSION Right 02/05/2019   Procedure: ATTEMPTED ANTERIOR LATERAL INTERBODY FUSION;  Surgeon: Consuella Lose, MD;  Location: [facility];  Service: Neurosurgery;  Laterality: Right;  RIGHT LATERAL TRANSPSOAS DISCECTOMY AND FUSION WITH ROBOTIC ASSISTED PEDICLE SCREW STABILIZATION, LUMBAR 4- LUMBAR 5  . COLONOSCOPY    . EYE SURGERY Bilateral 2013   lens implants for cataracts after Lasik  . KNEE ARTHROSCOPY Right 1980  . TONSILLECTOMY     at a very young age, but grew back  . TOTAL HIP ARTHROPLASTY Right 05/25/2019   Procedure: RIGHT TOTAL HIP ARTHROPLASTY ANTERIOR APPROACH;  Surgeon: Leandrew Koyanagi, MD;  Location: [facility];  Service: Orthopedics;  Laterality: Right;   Family History  Problem Relation Age of Onset  . Pulmonary fibrosis Mother   . Heart disease Father    Outpatient  Medications Prior to Visit  Medication Sig Dispense Refill  . cilostazol (PLETAL) 100 MG tablet Take 1 tablet (100 mg total) by mouth 2 (two) times daily before a meal. 60 tablet 11  . buPROPion (WELLBUTRIN SR) 150 MG 12 hr tablet Take 1 tablet (150 mg total) by mouth 2 (two) times daily. For the first 3 days, take one tablet by mouth daily. Starting day 4, take 1 tablet by mouth two times daily. Take for 12 weeks (Patient not taking: Reported on 04/12/2020) 63 tablet 3  . aspirin  EC 81 MG tablet Take 1 tablet (81 mg total) by mouth 2 (two) times daily. (Patient not taking: No sig reported) 84 tablet 0  . docusate sodium (COLACE) 100 MG capsule Take 1 capsule (100 mg total) by mouth daily as needed. (Patient not taking: No sig reported) 30 capsule 2  . meloxicam (MOBIC) 7.5 MG tablet Take 1 tablet (7.5 mg total) by mouth 2 (two) times daily as needed for pain. (Patient not taking: No sig reported) 30 tablet 2  . methocarbamol (ROBAXIN) 500 MG tablet Take 1 tablet (500 mg total) by mouth 2 (two) times daily as needed. (Patient not taking: No sig reported) 20 tablet 0  . ondansetron (ZOFRAN) 4 MG tablet Take 1 tablet (4 mg total) by mouth every 8 (eight) hours as needed for nausea or vomiting. (Patient not taking: No sig reported) 40 tablet 0  . oxyCODONE (OXYCONTIN) 10 mg 12 hr tablet Take 1 tablet (10 mg total) by mouth every 12 (twelve) hours. (Patient not taking: No sig reported) 6 tablet 0  . oxyCODONE-acetaminophen (PERCOCET) 5-325 MG tablet Take 1-2 tablets by mouth every 6 (six) hours as needed for severe pain. (Patient not taking: No sig reported) 30 tablet 0  . varenicline (CHANTIX CONTINUING MONTH PAK) 1 MG tablet Take 1 tablet (1 mg total) by mouth 2 (two) times daily. (Patient not taking: Reported on 04/12/2020) 60 tablet 5  . varenicline (CHANTIX STARTING MONTH PAK) 0.5 MG X 11 & 1 MG X 42 tablet Take one 0.5 mg tablet by mouth once daily for 3 days, then increase to one 0.5 mg tablet twice daily for 4 days, then increase to one 1 mg tablet twice daily. (Patient not taking: Reported on 04/12/2020) 53 tablet 0   No facility-administered medications prior to visit.   No Known Allergies    Objective:   Today's Vitals   04/12/20 0931  BP: 115/68  Pulse: (!) 104  Temp: 98 F (36.7 C)  TempSrc: Temporal  SpO2: 94%  Weight: 147 lb 12.8 oz (67 kg)  Height: 5\' 7"  (1.702 m)   Body mass index is 23.15 kg/m.   General: Well developed, well nourished. No acute  distress. HEENT: Normocephalic, non-traumatic. PERRL, EOMI. Conjunctiva clear. External ears normal. Right EAC obscured with wax. Left   EAC and TM normal. Nose clear without congestion or rhinorrhea. Mucous membranes moist. Oropharynx clear. Edentulous with   upper dentures. Neck: Supple. No lymphadenopathy. No thyromegaly. No bruits. Lungs: Clear to auscultation bilaterally. CV: RRR without murmurs or rubs. Pulses 2+ bilaterally. Abdomen: Soft, non-tender. Bowel sounds normal. No hepatosplenomegaly. No rebound or guarding. No palpable pulsatile masses. Extremities: Full ROM. No joint swelling or tenderness. No edema noted. Back: Straight. Surgical scars well healed. Skin: Warm and dry. No rashes. Psych: Alert and oriented. Normal mood and affect.  Health Maintenance Due  Topic Date Due  . Hepatitis C Screening  Never done  . COVID-19 Vaccine (3 -  Booster for [employer] series) 11/05/2019      Assessment & Plan:   1. Atherosclerosis of native artery of both lower extremities with intermittent claudication [facility]) Mr. Shostak notes that he has not had lab work for several years. He is not sure if he was ever screened for lipids. I will have him return fasting for labs, including lipids.  - Lipid panel; Future - Basic metabolic panel; Future  2. Tobacco dependence due to cigarettes Recommended Mr. Nees stop smoking. We discussed ways that he can try again with nicotine patches. I will continue to follow him to gauge his success.  3. Lower urinary tract symptoms (LUTS) 4. Frequency of micturition   I suspect this is due to BPH, but will check a UA and PSA to rule-out other potential causes. I will start Flomax and see him back in 3 months to assess.  - PSA; Future - Urinalysis, Routine w reflex microscopic; Future - tamsulosin (FLOMAX) 0.4 MG CAPS capsule; Take 1 capsule (0.4 mg total) by mouth daily.  Dispense: 30 capsule; Refill: 3  4. History of colon polyps Plan for colonoscopy  in 2024.  6. Need for pneumococcal vaccination  - Pneumococcal conjugate vaccine 13-valent IM  Haydee Salter, MD

## 2020-04-14 ENCOUNTER — Other Ambulatory Visit (INDEPENDENT_AMBULATORY_CARE_PROVIDER_SITE_OTHER): Payer: Medicare Other

## 2020-04-14 ENCOUNTER — Other Ambulatory Visit: Payer: Self-pay

## 2020-04-14 DIAGNOSIS — R399 Unspecified symptoms and signs involving the genitourinary system: Secondary | ICD-10-CM

## 2020-04-14 DIAGNOSIS — I70213 Atherosclerosis of native arteries of extremities with intermittent claudication, bilateral legs: Secondary | ICD-10-CM

## 2020-04-14 DIAGNOSIS — R350 Frequency of micturition: Secondary | ICD-10-CM

## 2020-04-14 LAB — BASIC METABOLIC PANEL
BUN: 16 mg/dL (ref 6–23)
CO2: 23 mEq/L (ref 19–32)
Calcium: 9.1 mg/dL (ref 8.4–10.5)
Chloride: 102 mEq/L (ref 96–112)
Creatinine, Ser: 1.7 mg/dL — ABNORMAL HIGH (ref 0.40–1.50)
GFR: 41.27 mL/min — ABNORMAL LOW (ref >60.00)
Glucose, Bld: 133 mg/dL — ABNORMAL HIGH (ref 70–99)
Potassium: 4.3 mEq/L (ref 3.5–5.1)
Sodium: 135 mEq/L (ref 135–145)

## 2020-04-14 LAB — LIPID PANEL
Cholesterol: 151 mg/dL (ref 0–200)
HDL: 30.3 mg/dL — ABNORMAL LOW (ref >39.00)
LDL Cholesterol: 104 mg/dL — ABNORMAL HIGH (ref 0–99)
NonHDL: 121.16
Total CHOL/HDL Ratio: 5
Triglycerides: 85 mg/dL (ref 0.0–149.0)
VLDL: 17 mg/dL (ref 0.0–40.0)

## 2020-04-14 LAB — PSA: PSA: 0.8 ng/mL (ref 0.10–4.00)

## 2020-04-14 LAB — URINALYSIS, ROUTINE W REFLEX MICROSCOPIC
Bilirubin Urine: NEGATIVE
Hgb urine dipstick: NEGATIVE
Ketones, ur: NEGATIVE
Leukocytes,Ua: NEGATIVE
Nitrite: NEGATIVE
RBC / HPF: NONE SEEN (ref 0–?)
Specific Gravity, Urine: 1.01 (ref 1.000–1.030)
Total Protein, Urine: NEGATIVE
Urine Glucose: NEGATIVE
Urobilinogen, UA: 0.2 (ref 0.0–1.0)
WBC, UA: NONE SEEN (ref 0–?)
pH: 7 (ref 5.0–8.0)

## 2020-04-15 ENCOUNTER — Encounter: Payer: Self-pay | Admitting: Family Medicine

## 2020-04-15 DIAGNOSIS — N1832 Chronic kidney disease, stage 3b: Secondary | ICD-10-CM | POA: Insufficient documentation

## 2020-04-15 DIAGNOSIS — N1831 Chronic kidney disease, stage 3a: Secondary | ICD-10-CM | POA: Insufficient documentation

## 2020-04-15 DIAGNOSIS — R739 Hyperglycemia, unspecified: Secondary | ICD-10-CM | POA: Insufficient documentation

## 2020-04-20 ENCOUNTER — Other Ambulatory Visit: Payer: Self-pay | Admitting: Family Medicine

## 2020-04-20 ENCOUNTER — Telehealth: Payer: Self-pay

## 2020-04-20 DIAGNOSIS — R739 Hyperglycemia, unspecified: Secondary | ICD-10-CM

## 2020-04-20 NOTE — Telephone Encounter (Signed)
Spoke with patient regarding elevated blood glucose levels and coming in for further testing. When you get a chance will you put in labs to be tested patient coming in 03/24/20.

## 2020-04-20 NOTE — Telephone Encounter (Signed)
Orders entered

## 2020-04-21 ENCOUNTER — Other Ambulatory Visit (INDEPENDENT_AMBULATORY_CARE_PROVIDER_SITE_OTHER): Payer: Medicare Other

## 2020-04-21 ENCOUNTER — Other Ambulatory Visit: Payer: Self-pay

## 2020-04-21 DIAGNOSIS — R739 Hyperglycemia, unspecified: Secondary | ICD-10-CM

## 2020-04-21 LAB — HEMOGLOBIN A1C: Hgb A1c MFr Bld: 5.8 % (ref 4.6–6.5)

## 2020-04-21 NOTE — Progress Notes (Signed)
Per orders pf Dr. Gena Fray pt is here for lab draw pt tolerated draw well.

## 2020-04-22 ENCOUNTER — Encounter: Payer: Self-pay | Admitting: Family Medicine

## 2020-04-22 DIAGNOSIS — R7303 Prediabetes: Secondary | ICD-10-CM | POA: Insufficient documentation

## 2020-05-31 ENCOUNTER — Ambulatory Visit: Payer: Medicare Other | Admitting: Vascular Surgery

## 2020-06-14 ENCOUNTER — Encounter: Payer: Self-pay | Admitting: Vascular Surgery

## 2020-06-14 ENCOUNTER — Ambulatory Visit (INDEPENDENT_AMBULATORY_CARE_PROVIDER_SITE_OTHER): Payer: Medicare Other | Admitting: Vascular Surgery

## 2020-06-14 ENCOUNTER — Other Ambulatory Visit: Payer: Self-pay

## 2020-06-14 VITALS — BP 120/73 | HR 98 | Temp 98.2°F | Resp 18 | Ht 68.0 in | Wt 153.0 lb

## 2020-06-14 DIAGNOSIS — I739 Peripheral vascular disease, unspecified: Secondary | ICD-10-CM | POA: Diagnosis not present

## 2020-06-14 NOTE — Progress Notes (Signed)
Patient name: Gerald Carpenter MRN: [id number] DOB: 1950-11-26 Sex: male  REASON FOR CONSULT: 3 month follow-up for claudication of bilateral lower extremities  HPI: Gerald Carpenter is a 70 y.o. male, with history of tobacco abuse that presents for 3 month follow-up of lower extremity claudication.  When I first saw him he described pain in both calves when walking 1000 feet to 0.25 miles.  He had no rest pain or wounds.  We recommended conservative management with smoking cessation, walking therapies, and prescribed Pletal.  Overall today does not feel he is doing any better and potentially slightly worse.  States at times he cannot even walk to his car in the parking lot.  Just went to [place] and really couldn't do much according to him.  He has cut back on his smoking significantly and is down to 5 cigarettes a day.  He has taken the Pletal as prescribed with no improvement.  No rest pain or wounds.  Past Medical History:  Diagnosis Date  . Arthritis   . Nephritis    when pt. was 7 yrs. ols  . 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    Past Surgical History:  Procedure Laterality Date  . ANTERIOR LAT LUMBAR FUSION Right 02/05/2019   Procedure: ATTEMPTED ANTERIOR LATERAL INTERBODY FUSION;  Surgeon: Consuella Lose, MD;  Location: [facility];  Service: Neurosurgery;  Laterality: Right;  RIGHT LATERAL TRANSPSOAS DISCECTOMY AND FUSION WITH ROBOTIC ASSISTED PEDICLE SCREW STABILIZATION, LUMBAR 4- LUMBAR 5  . COLONOSCOPY    . EYE SURGERY Bilateral 2013   lens implants for cataracts after Lasik  . KNEE ARTHROSCOPY Right 1980  . TONSILLECTOMY     at a very young age, but grew back  . TOTAL HIP ARTHROPLASTY Right 05/25/2019   Procedure: RIGHT TOTAL HIP ARTHROPLASTY ANTERIOR APPROACH;  Surgeon: Leandrew Koyanagi, MD;  Location: [facility];  Service: Orthopedics;  Laterality: Right;    Family History  Problem Relation Age of Onset  . Pulmonary fibrosis Mother   . Heart disease Father      SOCIAL HISTORY: Social History   Socioeconomic History  . Marital status: Married    Spouse name: Not on file  . Number of children: Not on file  . Years of education: Not on file  . Highest education level: Not on file  Occupational History  . Not on file  Tobacco Use  . Smoking status: Current Some Day Smoker    Packs/day: 0.50    Years: 60.00    Pack years: 30.00    Types: Cigarettes  . Smokeless tobacco: Never Used  Vaping Use  . Vaping Use: Never used  Substance and Sexual Activity  . 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 Drug use: Not Currently    Comment: prior usage of "speed"  when drinking  . Sexual activity: Not on file  Other Topics Concern  . Not on file  Social History Narrative   Recently moved here from [facility], [place]   Social Determinants of Health   Financial Resource Strain: Not on file  Food Insecurity: Not on file  Transportation Needs: Not on file  Physical Activity: Not on file  Stress: Not on file  Social Connections: Not on file  Intimate Partner Violence: Not on file    No Known Allergies  Current Outpatient Medications  Medication Sig Dispense Refill  . cilostazol (PLETAL) 100 MG tablet Take 1 tablet (100 mg total) by  mouth 2 (two) times daily before a meal. 60 tablet 11  . tamsulosin (FLOMAX) 0.4 MG CAPS capsule Take 1 capsule (0.4 mg total) by mouth daily. 30 capsule 3  . buPROPion (WELLBUTRIN SR) 150 MG 12 hr tablet Take 1 tablet (150 mg total) by mouth 2 (two) times daily. For the first 3 days, take one tablet by mouth daily. Starting day 4, take 1 tablet by mouth two times daily. Take for 12 weeks (Patient not taking: Reported on 04/12/2020) 63 tablet 3   No current facility-administered medications for this visit.    REVIEW OF SYSTEMS:  [X]  denotes positive finding, [ ]  denotes negative finding Cardiac  Comments:  Chest pain or chest pressure:    Shortness of breath upon exertion:     Short of breath when lying flat:    Irregular heart rhythm:        Vascular    Pain in calf, thigh, or hip brought on by ambulation: x Right leg worse today  Pain in feet at night that wakes you up from your sleep:     Blood clot in your veins:    Leg swelling:         Pulmonary    Oxygen at home:    Productive cough:     Wheezing:         Neurologic    Sudden weakness in arms or legs:     Sudden numbness in arms or legs:     Sudden onset of difficulty speaking or slurred speech:    Temporary loss of vision in one eye:     Problems with dizziness:         Gastrointestinal    Blood in stool:     Vomited blood:         Genitourinary    Burning when urinating:     Blood in urine:        Psychiatric    Major depression:         Hematologic    Bleeding problems:    Problems with blood clotting too easily:        Skin    Rashes or ulcers:        Constitutional    Fever or chills:      PHYSICAL EXAM: Vitals:   06/14/20 0928  BP: 120/73  Pulse: 98  Resp: 18  Temp: 98.2 F (36.8 C)  TempSrc: Temporal  SpO2: 98%  Weight: 153 lb (69.4 kg)  Height: 5\' 8"  (1.727 m)    GENERAL: The patient is a well-nourished male, in no acute distress. The vital signs are documented above. CARDIAC: There is a regular rate and rhythm.  VASCULAR:  Palpable femoral pulses both groins No palpable pedal pulses but feet are warm with no tissue loss PULMONARY: No respiratory distress. ABDOMEN: Soft and non-tender. MUSCULOSKELETAL: There are no major deformities or cyanosis. NEUROLOGIC: No focal weakness or paresthesias are detected. SKIN: There are no ulcers or rashes noted.   DATA:   ABIs 03/01/20 were 0.7 on the right biphasic and 0.65 on the left monophasic  Assessment/Plan:  70 year old male with history of tobacco abuse that presents for 4-month follow-up of bilateral lower extremity claudication.  We had previously recommended conservative management with smoking  cessation, walking therapies, Pletal etc.  On follow-up today does not feel he is doing any better and is slightly worse with more symptoms in the right leg that has become more disabling.  At times he states  he cannot even walk to his car in the parking lot.  After failing conservative management with worsening symptoms we recommended aortogram with lower extremity arteriogram and possible intervention.  I discussed risk and benefits including bleeding, infection, vessel injury etc.  Discussed that he may not have any endovascular options and require open surgical bypass at a later date.  We will get him scheduled for the Cath Lab next week.   [name], MD [facility] Office: [phone]

## 2020-06-20 ENCOUNTER — Other Ambulatory Visit (HOSPITAL_COMMUNITY)
Admission: RE | Admit: 2020-06-20 | Discharge: 2020-06-20 | Disposition: A | Discharge status: Home / Self Care | Payer: Medicare Other | Source: Ambulatory Visit | Attending: Vascular Surgery | Admitting: Vascular Surgery

## 2020-06-20 DIAGNOSIS — Z01812 Encounter for preprocedural laboratory examination: Secondary | ICD-10-CM | POA: Insufficient documentation

## 2020-06-20 DIAGNOSIS — Z20822 Contact with and (suspected) exposure to covid-19: Secondary | ICD-10-CM | POA: Insufficient documentation

## 2020-06-21 LAB — SARS CORONAVIRUS 2 (TAT 6-24 HRS): SARS Coronavirus 2: NEGATIVE

## 2020-06-23 ENCOUNTER — Encounter (HOSPITAL_COMMUNITY): Payer: Self-pay | Admitting: Vascular Surgery

## 2020-06-23 ENCOUNTER — Ambulatory Visit (HOSPITAL_COMMUNITY)
Admission: RE | Admit: 2020-06-23 | Discharge: 2020-06-23 | Disposition: A | Discharge status: Home / Self Care | Payer: Medicare Other | Attending: Vascular Surgery | Admitting: Vascular Surgery

## 2020-06-23 ENCOUNTER — Ambulatory Visit (HOSPITAL_BASED_OUTPATIENT_CLINIC_OR_DEPARTMENT_OTHER): Payer: Medicare Other

## 2020-06-23 ENCOUNTER — Encounter (HOSPITAL_COMMUNITY)
Admission: RE | Disposition: A | Discharge status: Home / Self Care | Payer: Self-pay | Source: Home / Self Care | Attending: Vascular Surgery

## 2020-06-23 DIAGNOSIS — Z79899 Other long term (current) drug therapy: Secondary | ICD-10-CM | POA: Insufficient documentation

## 2020-06-23 DIAGNOSIS — I70213 Atherosclerosis of native arteries of extremities with intermittent claudication, bilateral legs: Secondary | ICD-10-CM

## 2020-06-23 DIAGNOSIS — F17210 Nicotine dependence, cigarettes, uncomplicated: Secondary | ICD-10-CM | POA: Diagnosis not present

## 2020-06-23 DIAGNOSIS — Z01810 Encounter for preprocedural cardiovascular examination: Secondary | ICD-10-CM

## 2020-06-23 DIAGNOSIS — N189 Chronic kidney disease, unspecified: Secondary | ICD-10-CM | POA: Insufficient documentation

## 2020-06-23 HISTORY — PX: ABDOMINAL AORTOGRAM W/LOWER EXTREMITY: CATH118223

## 2020-06-23 LAB — POCT I-STAT, CHEM 8
BUN: 18 mg/dL (ref 8–23)
Calcium, Ion: 1.25 mmol/L (ref 1.15–1.40)
Chloride: 106 mmol/L (ref 98–111)
Creatinine, Ser: 1.5 mg/dL — ABNORMAL HIGH (ref 0.61–1.24)
Glucose, Bld: 101 mg/dL — ABNORMAL HIGH (ref 70–99)
HCT: 46 % (ref 39.0–52.0)
Hemoglobin: 15.6 g/dL (ref 13.0–17.0)
Potassium: 4.4 mmol/L (ref 3.5–5.1)
Sodium: 142 mmol/L (ref 135–145)
TCO2: 24 mmol/L (ref 22–32)

## 2020-06-23 SURGERY — ABDOMINAL AORTOGRAM W/LOWER EXTREMITY
Anesthesia: LOCAL

## 2020-06-23 MED ORDER — ASPIRIN EC 81 MG PO TBEC: 81.0000 mg | DELAYED_RELEASE_TABLET | Freq: Every day | ORAL | 2 refills | Status: AC

## 2020-06-23 MED ORDER — LIDOCAINE HCL (PF) 1 % IJ SOLN
INTRAMUSCULAR | Status: DC | PRN
  Administered 2020-06-23: 10 mL

## 2020-06-23 MED ORDER — FENTANYL CITRATE (PF) 100 MCG/2ML IJ SOLN
INTRAMUSCULAR | Status: DC | PRN
  Administered 2020-06-23: 50 ug via INTRAVENOUS

## 2020-06-23 MED ORDER — ATORVASTATIN CALCIUM 10 MG PO TABS: 10.0000 mg | ORAL_TABLET | Freq: Every day | ORAL | 11 refills | Status: DC

## 2020-06-23 MED ORDER — ACETAMINOPHEN 325 MG PO TABS: 650.0000 mg | ORAL_TABLET | ORAL | Status: DC | PRN

## 2020-06-23 MED ORDER — SODIUM CHLORIDE 0.9 % IV SOLN: 250.0000 mL | INTRAVENOUS | Status: DC | PRN

## 2020-06-23 MED ORDER — ONDANSETRON HCL 4 MG/2ML IJ SOLN
4.0000 mg | Freq: Four times a day (QID) | INTRAMUSCULAR | Status: DC | PRN
  Administered 2020-06-23: 4 mg via INTRAVENOUS

## 2020-06-23 MED ORDER — SODIUM CHLORIDE 0.9 % IV SOLN: INTRAVENOUS | Status: AC

## 2020-06-23 MED ORDER — ONDANSETRON HCL 4 MG/2ML IJ SOLN
INTRAMUSCULAR | Status: AC
  Filled 2020-06-23: qty 2

## 2020-06-23 MED ORDER — MIDAZOLAM HCL 2 MG/2ML IJ SOLN
INTRAMUSCULAR | Status: AC
  Filled 2020-06-23: qty 2

## 2020-06-23 MED ORDER — HEPARIN (PORCINE) IN NACL 1000-0.9 UT/500ML-% IV SOLN
INTRAVENOUS | Status: AC
  Filled 2020-06-23: qty 1000

## 2020-06-23 MED ORDER — HEPARIN (PORCINE) IN NACL 1000-0.9 UT/500ML-% IV SOLN
INTRAVENOUS | Status: DC | PRN
  Administered 2020-06-23 (×2): 500 mL

## 2020-06-23 MED ORDER — ASPIRIN EC 81 MG PO TBEC: 81.0000 mg | DELAYED_RELEASE_TABLET | Freq: Every day | ORAL | Status: DC

## 2020-06-23 MED ORDER — HYDRALAZINE HCL 20 MG/ML IJ SOLN
5.0000 mg | INTRAMUSCULAR | Status: DC | PRN
Start: 2020-06-23 — End: 2020-06-25

## 2020-06-23 MED ORDER — ATORVASTATIN CALCIUM 10 MG PO TABS: 10.0000 mg | ORAL_TABLET | Freq: Every day | ORAL | Status: DC

## 2020-06-23 MED ORDER — LABETALOL HCL 5 MG/ML IV SOLN: 10.0000 mg | INTRAVENOUS | Status: DC | PRN

## 2020-06-23 MED ORDER — IODIXANOL 320 MG/ML IV SOLN
INTRAVENOUS | Status: DC | PRN
  Administered 2020-06-23: 20 mL

## 2020-06-23 MED ORDER — FENTANYL CITRATE (PF) 100 MCG/2ML IJ SOLN
INTRAMUSCULAR | Status: AC
  Filled 2020-06-23: qty 2

## 2020-06-23 MED ORDER — SODIUM CHLORIDE 0.9% FLUSH: 3.0000 mL | INTRAVENOUS | Status: DC | PRN

## 2020-06-23 MED ORDER — SODIUM CHLORIDE 0.9% FLUSH: 3.0000 mL | Freq: Two times a day (BID) | INTRAVENOUS | Status: DC

## 2020-06-23 MED ORDER — SODIUM CHLORIDE 0.9 % IV SOLN: INTRAVENOUS | Status: DC

## 2020-06-23 MED ORDER — LIDOCAINE HCL (PF) 1 % IJ SOLN
INTRAMUSCULAR | Status: AC
  Filled 2020-06-23: qty 30

## 2020-06-23 MED ORDER — MIDAZOLAM HCL 2 MG/2ML IJ SOLN
INTRAMUSCULAR | Status: DC | PRN
  Administered 2020-06-23: 2 mg via INTRAVENOUS

## 2020-06-23 SURGICAL SUPPLY — 12 items
CATH OMNI FLUSH 5F 65CM (CATHETERS) ×2 IMPLANT
FILTER CO2 0.2 MICRON (VASCULAR PRODUCTS) ×2 IMPLANT
KIT MICROPUNCTURE NIT STIFF (SHEATH) ×2 IMPLANT
KIT PV (KITS) ×2 IMPLANT
RESERVOIR CO2 (VASCULAR PRODUCTS) ×2 IMPLANT
SET FLUSH CO2 (MISCELLANEOUS) ×2 IMPLANT
SHEATH PINNACLE 5F 10CM (SHEATH) ×2 IMPLANT
SHEATH PROBE COVER 6X72 (BAG) ×2 IMPLANT
SYR MEDRAD MARK 7 150ML (SYRINGE) IMPLANT
TRANSDUCER W/STOPCOCK (MISCELLANEOUS) ×2 IMPLANT
TRAY PV CATH (CUSTOM PROCEDURE TRAY) ×2 IMPLANT
WIRE STARTER BENTSON 035X150 (WIRE) ×2 IMPLANT

## 2020-06-23 NOTE — Progress Notes (Signed)
Bilateral lower extremity vein mapping completed. Refer to "CV Proc" under chart review to view preliminary results.  06/23/2020 1:53 PM Kelby Aline., MHA, RVT, RDCS, RDMS

## 2020-06-23 NOTE — Discharge Instructions (Signed)
Femoral Site Care  This sheet gives you information about how to care for yourself after your procedure. Your health care provider may also give you more specific instructions. If you have problems or questions, contact your health care provider. What can I expect after the procedure? After the procedure, it is common to have:  Bruising that usually fades within 1-2 weeks.  Tenderness at the site. Follow these instructions at home: Wound care  Follow instructions from your health care provider about how to take care of your insertion site. Make sure you: ? Wash your hands with soap and water before you change your bandage (dressing). If soap and water are not available, use hand sanitizer. ? Change your dressing as told by your health care provider. ? Leave stitches (sutures), skin glue, or adhesive strips in place. These skin closures may need to stay in place for 2 weeks or longer. If adhesive strip edges start to loosen and curl up, you may trim the loose edges. Do not remove adhesive strips completely unless your health care provider tells you to do that.  Do not take baths, swim, or use a hot tub until your health care provider approves.  You may shower 24-48 hours after the procedure or as told by your health care provider. ? Gently wash the site with plain soap and water. ? Pat the area dry with a clean towel. ? Do not rub the site. This may cause bleeding.  Do not apply powder or lotion to the site. Keep the site clean and dry.  Check your femoral site every day for signs of infection. Check for: ? Redness, swelling, or pain. ? Fluid or blood. ? Warmth. ? Pus or a bad smell. Activity  For the first 2-3 days after your procedure, or as long as directed: ? Avoid climbing stairs as much as possible. ? Do not squat.  Do not lift anything that is heavier than 10 lb (4.5 kg), or the limit that you are told, until your health care provider says that it is safe.  Rest as  directed. ? Avoid sitting for a long time without moving. Get up to take short walks every 1-2 hours.  Do not drive for 24 hours if you were given a medicine to help you relax (sedative). General instructions  Take over-the-counter and prescription medicines only as told by your health care provider.  Keep all follow-up visits as told by your health care provider. This is important. Contact a health care provider if you have:  A fever or chills.  You have redness, swelling, or pain around your insertion site. Get help right away if:  The catheter insertion area swells very fast.  You pass out.  You suddenly start to sweat or your skin gets clammy.  The catheter insertion area is bleeding, and the bleeding does not stop when you hold steady pressure on the area.  The area near or just beyond the catheter insertion site becomes pale, cool, tingly, or numb. These symptoms may represent a serious problem that is an emergency. Do not wait to see if the symptoms will go away. Get medical help right away. Call your local emergency services (911 in the [place] Do not drive yourself to the hospital. Summary  After the procedure, it is common to have bruising that usually fades within 1-2 weeks.  Check your femoral site every day for signs of infection.  Do not lift anything that is heavier than 10 lb (4.5 kg), or   the limit that you are told, until your health care provider says that it is safe. This information is not intended to replace advice given to you by your health care provider. Make sure you discuss any questions you have with your health care provider. Document Revised: 10/09/2019 Document Reviewed: 10/09/2019 Elsevier Patient Education  2021 Elsevier Inc.  

## 2020-06-23 NOTE — Progress Notes (Signed)
Site area: Right groin a 5 french arterial sheath was removed by Alfonse Ras RN 2 H  Site Prior to Removal:  Level 0  Pressure Applied For 20 MINUTES    Bedrest Beginning at 1105am X 4 hours  Manual:   Yes.    Patient Status During Pull:  stable  Post Pull Groin Site:  Level 0  Post Pull Instructions Given:  Yes.    Post Pull Pulses Present:  Yes.    Dressing Applied:  Yes.    Comments:

## 2020-06-23 NOTE — Progress Notes (Signed)
Nauseate, threw up moderate amount liquid gold colored emesis. Dr. Carlis Abbott by

## 2020-06-23 NOTE — Op Note (Signed)
    Patient name: Gerald Carpenter MRN: [id number] DOB: 20-Mar-1950 Sex: male  06/23/2020 Pre-operative Diagnosis: Bilateral lower extremity short distance lifestyle limiting claudication Post-operative diagnosis:  Same Surgeon:  [name], MD Procedure Performed: 1.  Ultrasound-guided access of right common femoral artery 2.  Aortogram including catheter selection of aorta with CO2 3.  Left lower extremity arteriogram with selection of second-order branches 4.  Right lower extremity arteriogram with runoff from right femoral sheath 5.  27 minutes of monitored moderate conscious sedation time  Total contrast: 27 mL, C)2 for rest of case  Indications: Patient is a 70 year old male who has been seen for worsening lifestyle limiting claudication of the bilateral lower extremities.  He presents today for bilateral lower extremity arteriogram possible intervention after risk benefits discussed.  Findings:   CO2 aortogram was performed given CKD and there was no flow-limiting stenosis identified in the aortoiliac segment.  We did obtain oblique imaging given spinal hardware.  Left lower extremity arteriogram showed patent common femoral with profunda runoff.  He has a flush SFA occlusion.  Reconstitutes distal SFA/above the popliteal artery with a patent below-knee popliteal artery and at least two-vessel runoff in the peroneal and anterior tibial artery.  Right lower extremity arteriogram showed about a 30 to 40% stenosis in the common femoral.  SFA is patent with a patent above and below-knee popliteal artery.  Runoff appears to be in the anterior tibial and peroneal artery with a high-grade stenosis of the proximal PT and diffusely diseased PT distally.   Procedure:  The patient was identified in the holding area and taken to room 8.  The patient was then placed supine on the table and prepped and draped in the usual sterile fashion.  A time out was called.  Ultrasound was used to  evaluate the right common femoral artery.  It was patent .  A digital ultrasound image was acquired.  A micropuncture needle was used to access the right common femoral artery under ultrasound guidance.  An 018 wire was advanced without resistance and a micropuncture sheath was placed.  The 018 wire was removed and a benson wire was placed.  The micropuncture sheath was exchanged for a 5 french sheath.  An omniflush catheter was advanced over the wire to the level of L-1.  An abdominal angiogram was obtained with CO2.  Next, using the omniflush catheter and a benson wire, the aortic bifurcation was crossed and the catheter was placed into theleft external iliac artery and left runoff was obtained.  Evaluated images he has a flush left SFA occlusion will require left common femoral to above-knee popliteal bypass.  I then pulled the wires and catheters out.  I then shot retrograde runoff to the right common femoral sheath.  He remained stable and taken holding to have sheath removed after groin access shot was obtained.  Pain: Patient will be scheduled for left common femoral to above-knee popliteal bypass next week.  Vein mapping today.  [name], MD [facility] Office: [phone]

## 2020-06-23 NOTE — H&P (Signed)
History and Physical Interval Note:  06/23/2020 8:21 AM  Gerald Carpenter  has presented today for surgery, with the diagnosis of pvd.  The various methods of treatment have been discussed with the patient and family. After consideration of risks, benefits and other options for treatment, the patient has consented to  Procedure(s): ABDOMINAL AORTOGRAM W/LOWER EXTREMITY (N/A) as a surgical intervention.  The patient's history has been reviewed, patient examined, no change in status, stable for surgery.  I have reviewed the patient's chart and labs.  Questions were answered to the patient's satisfaction.    Abdominal aortogram, lower extremity arteriogram, lifestyle limiting claudication lower extremities.  Gerald Carpenter  Patient name: Gerald Carpenter MRN: [id number]        DOB: 07-01-1950        Sex: male  REASON FOR CONSULT: 3 month follow-up for claudication of bilateral lower extremities  HPI: Gerald Carpenter is a 70 y.o. male, with history of tobacco abuse that presents for 3 month follow-up of lower extremity claudication.  When I first saw him he described pain in both calves when walking 1000 feet to 0.25 miles.  He had no rest pain or wounds.  We recommended conservative management with smoking cessation, walking therapies, and prescribed Pletal.  Overall today does not feel he is doing any better and potentially slightly worse.  States at times he cannot even walk to his car in the parking lot.  Just went to [place] and really couldn't do much according to him.  He has cut back on his smoking significantly and is down to 5 cigarettes a day.  He has taken the Pletal as prescribed with no improvement.  No rest pain or wounds.      Past Medical History:  Diagnosis Date  . Arthritis   . Nephritis    when pt. was 7 yrs. ols  . 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         Past Surgical History:  Procedure Laterality Date  . ANTERIOR LAT LUMBAR FUSION Right  02/05/2019   Procedure: ATTEMPTED ANTERIOR LATERAL INTERBODY FUSION;  Surgeon: Consuella Lose, MD;  Location: [place];  Service: Neurosurgery;  Laterality: Right;  RIGHT LATERAL TRANSPSOAS DISCECTOMY AND FUSION WITH ROBOTIC ASSISTED PEDICLE SCREW STABILIZATION, LUMBAR 4- LUMBAR 5  . COLONOSCOPY    . EYE SURGERY Bilateral 2013   lens implants for cataracts after Lasik  . KNEE ARTHROSCOPY Right 1980  . TONSILLECTOMY     at a very young age, but grew back  . TOTAL HIP ARTHROPLASTY Right 05/25/2019   Procedure: RIGHT TOTAL HIP ARTHROPLASTY ANTERIOR APPROACH;  Surgeon: Leandrew Koyanagi, MD;  Location: [place];  Service: Orthopedics;  Laterality: Right;         Family History  Problem Relation Age of Onset  . Pulmonary fibrosis Mother   . Heart disease Father     SOCIAL HISTORY: Social History        Socioeconomic History  . Marital status: Married    Spouse name: Not on file  . Number of children: Not on file  . Years of education: Not on file  . Highest education level: Not on file  Occupational History  . Not on file  Tobacco Use  . Smoking status: Current Some Day Smoker    Packs/day: 0.50    Years: 60.00    Pack years: 30.00    Types: Cigarettes  . Smokeless tobacco: Never Used  Vaping Use  . Vaping Use: Never used  Substance and Sexual Activity  . 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 Drug use: Not Currently    Comment: prior usage of "speed"  when drinking  . Sexual activity: Not on file  Other Topics Concern  . Not on file  Social History Narrative   Recently moved here from [facility], [place]   Social Determinants of Health   Financial Resource Strain: Not on file  Food Insecurity: Not on file  Transportation Needs: Not on file  Physical Activity: Not on file  Stress: Not on file  Social Connections: Not on file  Intimate Partner Violence: Not on file    No Known Allergies         Current Outpatient Medications  Medication Sig Dispense Refill  . cilostazol (PLETAL) 100 MG tablet Take 1 tablet (100 mg total) by mouth 2 (two) times daily before a meal. 60 tablet 11  . tamsulosin (FLOMAX) 0.4 MG CAPS capsule Take 1 capsule (0.4 mg total) by mouth daily. 30 capsule 3  . buPROPion (WELLBUTRIN SR) 150 MG 12 hr tablet Take 1 tablet (150 mg total) by mouth 2 (two) times daily. For the first 3 days, take one tablet by mouth daily. Starting day 4, take 1 tablet by mouth two times daily. Take for 12 weeks (Patient not taking: Reported on 04/12/2020) 63 tablet 3   No current facility-administered medications for this visit.    REVIEW OF SYSTEMS:  [X]  denotes positive finding, [ ]  denotes negative finding Cardiac  Comments:  Chest pain or chest pressure:    Shortness of breath upon exertion:    Short of breath when lying flat:    Irregular heart rhythm:        Vascular    Pain in calf, thigh, or hip brought on by ambulation: x Right leg worse today  Pain in feet at night that wakes you up from your sleep:     Blood clot in your veins:    Leg swelling:         Pulmonary    Oxygen at home:    Productive cough:     Wheezing:         Neurologic    Sudden weakness in arms or legs:     Sudden numbness in arms or legs:     Sudden onset of difficulty speaking or slurred speech:    Temporary loss of vision in one eye:     Problems with dizziness:         Gastrointestinal    Blood in stool:     Vomited blood:         Genitourinary    Burning when urinating:     Blood in urine:        Psychiatric    Major depression:         Hematologic    Bleeding problems:    Problems with blood clotting too easily:        Skin    Rashes or ulcers:        Constitutional    Fever or chills:      PHYSICAL EXAM:    Vitals:   06/14/20 0928  BP: 120/73  Pulse: 98  Resp: 18   Temp: 98.2 F (36.8 C)  TempSrc: Temporal  SpO2: 98%  Weight: 153 lb (69.4 kg)  Height: 5\' 8"  (1.727 m)    GENERAL: The patient is a well-nourished male, in no  acute distress. The vital signs are documented above. CARDIAC: There is a regular rate and rhythm.  VASCULAR:  Palpable femoral pulses both groins No palpable pedal pulses but feet are warm with no tissue loss PULMONARY: No respiratory distress. ABDOMEN: Soft and non-tender. MUSCULOSKELETAL: There are no major deformities or cyanosis. NEUROLOGIC: No focal weakness or paresthesias are detected. SKIN: There are no ulcers or rashes noted.   DATA:   ABIs 03/01/20 were 0.7 on the right biphasic and 0.65 on the left monophasic  Assessment/Plan:  70 year old male with history of tobacco abuse that presents for 60-month follow-up of bilateral lower extremity claudication.  We had previously recommended conservative management with smoking cessation, walking therapies, Pletal etc.  On follow-up today does not feel he is doing any better and is slightly worse with more symptoms in the right leg that has become more disabling.  At times he states he cannot even walk to his car in the parking lot.  After failing conservative management with worsening symptoms we recommended aortogram with lower extremity arteriogram and possible intervention.  I discussed risk and benefits including bleeding, infection, vessel injury etc.  Discussed that he may not have any endovascular options and require open surgical bypass at a later date.  We will get him scheduled for the Cath Lab next week.   Gerald Heck, MD [facility] [place] Office: [phone]

## 2020-06-24 ENCOUNTER — Other Ambulatory Visit: Payer: Self-pay

## 2020-06-28 ENCOUNTER — Other Ambulatory Visit (HOSPITAL_COMMUNITY)
Admission: RE | Admit: 2020-06-28 | Discharge: 2020-06-28 | Disposition: A | Discharge status: Home / Self Care | Payer: Medicare Other | Source: Ambulatory Visit | Attending: Vascular Surgery | Admitting: Vascular Surgery

## 2020-06-28 ENCOUNTER — Other Ambulatory Visit: Payer: Self-pay

## 2020-06-28 ENCOUNTER — Encounter (HOSPITAL_COMMUNITY): Payer: Self-pay | Admitting: Vascular Surgery

## 2020-06-28 DIAGNOSIS — Z20822 Contact with and (suspected) exposure to covid-19: Secondary | ICD-10-CM | POA: Insufficient documentation

## 2020-06-28 DIAGNOSIS — Z01812 Encounter for preprocedural laboratory examination: Secondary | ICD-10-CM | POA: Insufficient documentation

## 2020-06-28 LAB — SARS CORONAVIRUS 2 (TAT 6-24 HRS): SARS Coronavirus 2: NEGATIVE

## 2020-06-28 NOTE — Progress Notes (Signed)
Mr. Gerald Carpenter denies chest pain or shortness of breath. Patient was tested for Covid and has been in quarantine since that time.

## 2020-06-29 ENCOUNTER — Inpatient Hospital Stay (HOSPITAL_COMMUNITY): Payer: Medicare Other | Admitting: Certified Registered"

## 2020-06-29 ENCOUNTER — Encounter (HOSPITAL_COMMUNITY): Payer: Self-pay | Admitting: Vascular Surgery

## 2020-06-29 ENCOUNTER — Encounter (HOSPITAL_COMMUNITY)
Admission: RE | Disposition: A | Discharge status: Home Health | Payer: Self-pay | Source: Home / Self Care | Attending: Vascular Surgery

## 2020-06-29 ENCOUNTER — Other Ambulatory Visit: Payer: Self-pay

## 2020-06-29 ENCOUNTER — Inpatient Hospital Stay (HOSPITAL_COMMUNITY)
Admission: RE | Admit: 2020-06-29 | Discharge: 2020-07-01 | DRG: 254 | Disposition: A | Discharge status: Home Health | Payer: Medicare Other | Attending: Vascular Surgery | Admitting: Vascular Surgery

## 2020-06-29 DIAGNOSIS — Z20822 Contact with and (suspected) exposure to covid-19: Secondary | ICD-10-CM | POA: Diagnosis present

## 2020-06-29 DIAGNOSIS — Z8249 Family history of ischemic heart disease and other diseases of the circulatory system: Secondary | ICD-10-CM

## 2020-06-29 DIAGNOSIS — Z981 Arthrodesis status: Secondary | ICD-10-CM

## 2020-06-29 DIAGNOSIS — Z96641 Presence of right artificial hip joint: Secondary | ICD-10-CM | POA: Diagnosis present

## 2020-06-29 DIAGNOSIS — F17210 Nicotine dependence, cigarettes, uncomplicated: Secondary | ICD-10-CM | POA: Diagnosis present

## 2020-06-29 DIAGNOSIS — I70202 Unspecified atherosclerosis of native arteries of extremities, left leg: Secondary | ICD-10-CM | POA: Diagnosis present

## 2020-06-29 DIAGNOSIS — I739 Peripheral vascular disease, unspecified: Secondary | ICD-10-CM | POA: Diagnosis present

## 2020-06-29 DIAGNOSIS — I70212 Atherosclerosis of native arteries of extremities with intermittent claudication, left leg: Secondary | ICD-10-CM

## 2020-06-29 DIAGNOSIS — M1990 Unspecified osteoarthritis, unspecified site: Secondary | ICD-10-CM | POA: Diagnosis present

## 2020-06-29 HISTORY — DX: Family history of other specified conditions: Z84.89

## 2020-06-29 HISTORY — PX: ENDOVEIN HARVEST OF GREATER SAPHENOUS VEIN: SHX5059

## 2020-06-29 HISTORY — PX: FEMORAL-POPLITEAL BYPASS GRAFT: SHX937

## 2020-06-29 HISTORY — DX: Peripheral vascular disease, unspecified: I73.9

## 2020-06-29 HISTORY — DX: Other specified postprocedural states: Z98.890

## 2020-06-29 HISTORY — DX: Nausea with vomiting, unspecified: R11.2

## 2020-06-29 HISTORY — DX: Other complications of anesthesia, initial encounter: T88.59XA

## 2020-06-29 LAB — CBC
HCT: 50.5 % (ref 39.0–52.0)
Hemoglobin: 16.7 g/dL (ref 13.0–17.0)
MCH: 31.5 pg (ref 26.0–34.0)
MCHC: 33.1 g/dL (ref 30.0–36.0)
MCV: 95.1 fL (ref 80.0–100.0)
Platelets: 178 10*3/uL (ref 150–400)
RBC: 5.31 MIL/uL (ref 4.22–5.81)
RDW: 14.2 % (ref 11.5–15.5)
WBC: 15.8 10*3/uL — ABNORMAL HIGH (ref 4.0–10.5)
nRBC: 0 % (ref 0.0–0.2)

## 2020-06-29 LAB — COMPREHENSIVE METABOLIC PANEL
ALT: 14 U/L (ref 0–44)
AST: 12 U/L — ABNORMAL LOW (ref 15–41)
Albumin: 3.5 g/dL (ref 3.5–5.0)
Alkaline Phosphatase: 47 U/L (ref 38–126)
Anion gap: 9 (ref 5–15)
BUN: 21 mg/dL (ref 8–23)
CO2: 21 mmol/L — ABNORMAL LOW (ref 22–32)
Calcium: 8.9 mg/dL (ref 8.9–10.3)
Chloride: 108 mmol/L (ref 98–111)
Creatinine, Ser: 1.54 mg/dL — ABNORMAL HIGH (ref 0.61–1.24)
GFR, Estimated: 49 mL/min — ABNORMAL LOW (ref >60)
Glucose, Bld: 100 mg/dL — ABNORMAL HIGH (ref 70–99)
Potassium: 4.4 mmol/L (ref 3.5–5.1)
Sodium: 138 mmol/L (ref 135–145)
Total Bilirubin: 1 mg/dL (ref 0.3–1.2)
Total Protein: 6.9 g/dL (ref 6.5–8.1)

## 2020-06-29 LAB — PROTIME-INR
INR: 1.1 (ref 0.8–1.2)
Prothrombin Time: 14.1 seconds (ref 11.4–15.2)

## 2020-06-29 LAB — BLOOD GAS, ARTERIAL
Acid-base deficit: 2.3 mmol/L — ABNORMAL HIGH (ref 0.0–2.0)
Bicarbonate: 21.5 mmol/L (ref 20.0–28.0)
FIO2: 21
O2 Saturation: 95.2 %
Patient temperature: 37
pCO2 arterial: 33.5 mmHg (ref 32.0–48.0)
pH, Arterial: 7.423 (ref 7.350–7.450)
pO2, Arterial: 76.5 mmHg — ABNORMAL LOW (ref 83.0–108.0)

## 2020-06-29 LAB — SURGICAL PCR SCREEN
MRSA, PCR: NEGATIVE
Staphylococcus aureus: NEGATIVE

## 2020-06-29 LAB — POCT ACTIVATED CLOTTING TIME
Activated Clotting Time: 297 seconds
Activated Clotting Time: 208 seconds

## 2020-06-29 LAB — TYPE AND SCREEN
ABO/RH(D): A POS
Antibody Screen: NEGATIVE

## 2020-06-29 LAB — APTT: aPTT: 31 seconds (ref 24–36)

## 2020-06-29 SURGERY — BYPASS GRAFT FEMORAL-POPLITEAL ARTERY
Anesthesia: General | Site: Leg Upper | Laterality: Left

## 2020-06-29 MED ORDER — FENTANYL CITRATE (PF) 100 MCG/2ML IJ SOLN
INTRAMUSCULAR | Status: DC | PRN
  Administered 2020-06-29: 100 ug via INTRAVENOUS
  Administered 2020-06-29 (×3): 50 ug via INTRAVENOUS

## 2020-06-29 MED ORDER — ONDANSETRON HCL 4 MG/2ML IJ SOLN
INTRAMUSCULAR | Status: AC
  Filled 2020-06-29: qty 2

## 2020-06-29 MED ORDER — SODIUM CHLORIDE 0.9 % IV SOLN: INTRAVENOUS | Status: DC

## 2020-06-29 MED ORDER — 0.9 % SODIUM CHLORIDE (POUR BTL) OPTIME
TOPICAL | Status: DC | PRN
  Administered 2020-06-29: 2000 mL

## 2020-06-29 MED ORDER — LIDOCAINE HCL (CARDIAC) PF 100 MG/5ML IV SOSY
PREFILLED_SYRINGE | INTRAVENOUS | Status: DC | PRN
  Administered 2020-06-29: 40 mg via INTRAVENOUS

## 2020-06-29 MED ORDER — SODIUM CHLORIDE 0.9 % IV SOLN
INTRAVENOUS | Status: DC | PRN
  Administered 2020-06-29: 500 mL

## 2020-06-29 MED ORDER — DEXAMETHASONE SODIUM PHOSPHATE 10 MG/ML IJ SOLN
INTRAMUSCULAR | Status: DC | PRN
  Administered 2020-06-29: 5 mg via INTRAVENOUS

## 2020-06-29 MED ORDER — ATORVASTATIN CALCIUM 40 MG PO TABS
40.0000 mg | ORAL_TABLET | Freq: Every day | ORAL | Status: DC
  Administered 2020-06-29 – 2020-07-01 (×3): 40 mg via ORAL
  Filled 2020-06-29 (×3): qty 1

## 2020-06-29 MED ORDER — FENTANYL CITRATE (PF) 100 MCG/2ML IJ SOLN: 25.0000 ug | INTRAMUSCULAR | Status: DC | PRN

## 2020-06-29 MED ORDER — PROPOFOL 10 MG/ML IV BOLUS
INTRAVENOUS | Status: AC
  Filled 2020-06-29: qty 40

## 2020-06-29 MED ORDER — ALUM & MAG HYDROXIDE-SIMETH 200-200-20 MG/5ML PO SUSP: 15.0000 mL | ORAL | Status: DC | PRN

## 2020-06-29 MED ORDER — GUAIFENESIN-DM 100-10 MG/5ML PO SYRP: 15.0000 mL | ORAL_SOLUTION | ORAL | Status: DC | PRN

## 2020-06-29 MED ORDER — CEFAZOLIN SODIUM-DEXTROSE 2-4 GM/100ML-% IV SOLN
2.0000 g | Freq: Three times a day (TID) | INTRAVENOUS | Status: AC
  Administered 2020-06-29 – 2020-06-30 (×2): 2 g via INTRAVENOUS
  Filled 2020-06-29 (×2): qty 100

## 2020-06-29 MED ORDER — PROPOFOL 10 MG/ML IV BOLUS
INTRAVENOUS | Status: DC | PRN
  Administered 2020-06-29: 120 mg via INTRAVENOUS

## 2020-06-29 MED ORDER — LACTATED RINGERS IV SOLN: INTRAVENOUS | Status: DC

## 2020-06-29 MED ORDER — ONDANSETRON HCL 4 MG/2ML IJ SOLN: 4.0000 mg | Freq: Four times a day (QID) | INTRAMUSCULAR | Status: DC | PRN

## 2020-06-29 MED ORDER — FLUTICASONE PROPIONATE 50 MCG/ACT NA SUSP
2.0000 | Freq: Every morning | NASAL | Status: DC
  Administered 2020-06-30 – 2020-07-01 (×2): 2 via NASAL
  Filled 2020-06-29: qty 16

## 2020-06-29 MED ORDER — POTASSIUM CHLORIDE CRYS ER 20 MEQ PO TBCR: 20.0000 meq | EXTENDED_RELEASE_TABLET | Freq: Every day | ORAL | Status: DC | PRN

## 2020-06-29 MED ORDER — ACETAMINOPHEN 650 MG RE SUPP
325.0000 mg | RECTAL | Status: DC | PRN
Start: 2020-06-29 — End: 2020-07-01

## 2020-06-29 MED ORDER — ORAL CARE MOUTH RINSE: 15.0000 mL | Freq: Once | OROMUCOSAL | Status: AC

## 2020-06-29 MED ORDER — MIDAZOLAM HCL 2 MG/2ML IJ SOLN
INTRAMUSCULAR | Status: AC
  Filled 2020-06-29: qty 2

## 2020-06-29 MED ORDER — SENNOSIDES-DOCUSATE SODIUM 8.6-50 MG PO TABS: 1.0000 | ORAL_TABLET | Freq: Every evening | ORAL | Status: DC | PRN

## 2020-06-29 MED ORDER — EPHEDRINE SULFATE 50 MG/ML IJ SOLN
INTRAMUSCULAR | Status: DC | PRN
  Administered 2020-06-29: 5 mg via INTRAVENOUS
  Administered 2020-06-29: 10 mg via INTRAVENOUS

## 2020-06-29 MED ORDER — METOPROLOL TARTRATE 5 MG/5ML IV SOLN: 2.0000 mg | INTRAVENOUS | Status: DC | PRN

## 2020-06-29 MED ORDER — LABETALOL HCL 5 MG/ML IV SOLN: 10.0000 mg | INTRAVENOUS | Status: DC | PRN

## 2020-06-29 MED ORDER — CHLORHEXIDINE GLUCONATE CLOTH 2 % EX PADS: 6.0000 | MEDICATED_PAD | Freq: Once | CUTANEOUS | Status: DC

## 2020-06-29 MED ORDER — OXYCODONE-ACETAMINOPHEN 5-325 MG PO TABS
1.0000 | ORAL_TABLET | ORAL | Status: DC | PRN
Start: 2020-06-29 — End: 2020-07-01
  Administered 2020-06-29 – 2020-06-30 (×2): 2 via ORAL
  Administered 2020-06-30: 1 via ORAL
  Administered 2020-07-01: 2 via ORAL
  Filled 2020-06-29: qty 1
  Filled 2020-06-29 (×3): qty 2

## 2020-06-29 MED ORDER — MIDAZOLAM HCL 5 MG/5ML IJ SOLN
INTRAMUSCULAR | Status: DC | PRN
  Administered 2020-06-29: 2 mg via INTRAVENOUS

## 2020-06-29 MED ORDER — PANTOPRAZOLE SODIUM 40 MG PO TBEC
40.0000 mg | DELAYED_RELEASE_TABLET | Freq: Every day | ORAL | Status: DC
  Administered 2020-06-29 – 2020-07-01 (×3): 40 mg via ORAL
  Filled 2020-06-29 (×3): qty 1

## 2020-06-29 MED ORDER — PROTAMINE SULFATE 10 MG/ML IV SOLN
INTRAVENOUS | Status: DC | PRN
  Administered 2020-06-29: 40 mg via INTRAVENOUS

## 2020-06-29 MED ORDER — AMISULPRIDE (ANTIEMETIC) 5 MG/2ML IV SOLN: 10.0000 mg | Freq: Once | INTRAVENOUS | Status: DC | PRN

## 2020-06-29 MED ORDER — PROTAMINE SULFATE 10 MG/ML IV SOLN
INTRAVENOUS | Status: AC
  Filled 2020-06-29: qty 5

## 2020-06-29 MED ORDER — LIDOCAINE 2% (20 MG/ML) 5 ML SYRINGE
INTRAMUSCULAR | Status: AC
  Filled 2020-06-29: qty 5

## 2020-06-29 MED ORDER — HEPARIN SODIUM (PORCINE) 1000 UNIT/ML IJ SOLN
INTRAMUSCULAR | Status: DC | PRN
  Administered 2020-06-29: 7000 [IU] via INTRAVENOUS

## 2020-06-29 MED ORDER — ROCURONIUM BROMIDE 10 MG/ML (PF) SYRINGE
PREFILLED_SYRINGE | INTRAVENOUS | Status: DC | PRN
  Administered 2020-06-29: 40 mg via INTRAVENOUS
  Administered 2020-06-29: 10 mg via INTRAVENOUS
  Administered 2020-06-29: 20 mg via INTRAVENOUS
  Administered 2020-06-29: 30 mg via INTRAVENOUS
  Administered 2020-06-29: 20 mg via INTRAVENOUS

## 2020-06-29 MED ORDER — PROPOFOL 1000 MG/100ML IV EMUL
INTRAVENOUS | Status: AC
  Filled 2020-06-29: qty 100

## 2020-06-29 MED ORDER — SODIUM CHLORIDE 0.9 % IV SOLN: 500.0000 mL | Freq: Once | INTRAVENOUS | Status: DC | PRN

## 2020-06-29 MED ORDER — ACETAMINOPHEN 500 MG PO TABS
1000.0000 mg | ORAL_TABLET | Freq: Once | ORAL | Status: AC
  Administered 2020-06-29: 1000 mg via ORAL
  Filled 2020-06-29: qty 2

## 2020-06-29 MED ORDER — SODIUM CHLORIDE 0.9 % IV SOLN
INTRAVENOUS | Status: DC | PRN
  Administered 2020-06-29: 50 ug/min via INTRAVENOUS

## 2020-06-29 MED ORDER — ACETAMINOPHEN 325 MG PO TABS: 325.0000 mg | ORAL_TABLET | ORAL | Status: DC | PRN

## 2020-06-29 MED ORDER — TAMSULOSIN HCL 0.4 MG PO CAPS
0.4000 mg | ORAL_CAPSULE | Freq: Every day | ORAL | Status: DC
  Administered 2020-06-29 – 2020-07-01 (×3): 0.4 mg via ORAL
  Filled 2020-06-29 (×3): qty 1

## 2020-06-29 MED ORDER — PHENYLEPHRINE 40 MCG/ML (10ML) SYRINGE FOR IV PUSH (FOR BLOOD PRESSURE SUPPORT)
PREFILLED_SYRINGE | INTRAVENOUS | Status: AC
  Filled 2020-06-29: qty 10

## 2020-06-29 MED ORDER — HYDROMORPHONE HCL 1 MG/ML IJ SOLN
0.5000 mg | INTRAMUSCULAR | Status: DC | PRN
  Administered 2020-06-29: 1 mg via INTRAVENOUS
  Filled 2020-06-29: qty 1

## 2020-06-29 MED ORDER — ROCURONIUM BROMIDE 10 MG/ML (PF) SYRINGE
PREFILLED_SYRINGE | INTRAVENOUS | Status: AC
  Filled 2020-06-29: qty 10

## 2020-06-29 MED ORDER — LACTATED RINGERS IV SOLN: INTRAVENOUS | Status: DC | PRN

## 2020-06-29 MED ORDER — HYDRALAZINE HCL 20 MG/ML IJ SOLN: 5.0000 mg | INTRAMUSCULAR | Status: DC | PRN

## 2020-06-29 MED ORDER — CILOSTAZOL 50 MG PO TABS
100.0000 mg | ORAL_TABLET | Freq: Two times a day (BID) | ORAL | Status: DC
  Administered 2020-06-29 – 2020-07-01 (×4): 100 mg via ORAL
  Filled 2020-06-29 (×4): qty 2

## 2020-06-29 MED ORDER — MAGNESIUM SULFATE 2 GM/50ML IV SOLN: 2.0000 g | Freq: Every day | INTRAVENOUS | Status: DC | PRN

## 2020-06-29 MED ORDER — FENTANYL CITRATE (PF) 250 MCG/5ML IJ SOLN
INTRAMUSCULAR | Status: AC
  Filled 2020-06-29: qty 5

## 2020-06-29 MED ORDER — ASPIRIN EC 81 MG PO TBEC
81.0000 mg | DELAYED_RELEASE_TABLET | Freq: Every day | ORAL | Status: DC
  Administered 2020-06-29 – 2020-07-01 (×3): 81 mg via ORAL
  Filled 2020-06-29 (×3): qty 1

## 2020-06-29 MED ORDER — ONDANSETRON HCL 4 MG/2ML IJ SOLN
INTRAMUSCULAR | Status: DC | PRN
  Administered 2020-06-29: 2 mg via INTRAVENOUS

## 2020-06-29 MED ORDER — ATORVASTATIN CALCIUM 10 MG PO TABS: 10.0000 mg | ORAL_TABLET | Freq: Every day | ORAL | Status: DC

## 2020-06-29 MED ORDER — HEPARIN SODIUM (PORCINE) 5000 UNIT/ML IJ SOLN
5000.0000 [IU] | Freq: Three times a day (TID) | INTRAMUSCULAR | Status: DC
  Administered 2020-06-30 – 2020-07-01 (×4): 5000 [IU] via SUBCUTANEOUS
  Filled 2020-06-29 (×4): qty 1

## 2020-06-29 MED ORDER — DOCUSATE SODIUM 100 MG PO CAPS
100.0000 mg | ORAL_CAPSULE | Freq: Every day | ORAL | Status: DC
  Administered 2020-06-30 – 2020-07-01 (×3): 100 mg via ORAL
  Filled 2020-06-29 (×3): qty 1

## 2020-06-29 MED ORDER — PHENYLEPHRINE HCL (PRESSORS) 10 MG/ML IV SOLN
INTRAVENOUS | Status: DC | PRN
  Administered 2020-06-29: 120 ug via INTRAVENOUS

## 2020-06-29 MED ORDER — SUGAMMADEX SODIUM 200 MG/2ML IV SOLN
INTRAVENOUS | Status: DC | PRN
  Administered 2020-06-29: 150 mg via INTRAVENOUS

## 2020-06-29 MED ORDER — BISACODYL 5 MG PO TBEC: 5.0000 mg | DELAYED_RELEASE_TABLET | Freq: Every day | ORAL | Status: DC | PRN

## 2020-06-29 MED ORDER — CHLORHEXIDINE GLUCONATE 0.12 % MT SOLN
15.0000 mL | Freq: Once | OROMUCOSAL | Status: AC
  Administered 2020-06-29: 15 mL via OROMUCOSAL
  Filled 2020-06-29: qty 15

## 2020-06-29 MED ORDER — DEXAMETHASONE SODIUM PHOSPHATE 10 MG/ML IJ SOLN
INTRAMUSCULAR | Status: AC
  Filled 2020-06-29: qty 1

## 2020-06-29 MED ORDER — PHENOL 1.4 % MT LIQD: 1.0000 | OROMUCOSAL | Status: DC | PRN

## 2020-06-29 MED ORDER — CEFAZOLIN SODIUM-DEXTROSE 2-4 GM/100ML-% IV SOLN
2.0000 g | INTRAVENOUS | Status: AC
  Administered 2020-06-29: 2 g via INTRAVENOUS
  Filled 2020-06-29: qty 100

## 2020-06-29 MED ORDER — SODIUM CHLORIDE 0.9 % IV SOLN
INTRAVENOUS | Status: AC
  Filled 2020-06-29: qty 1.2

## 2020-06-29 SURGICAL SUPPLY — 63 items
BANDAGE ESMARK 6X9 LF (GAUZE/BANDAGES/DRESSINGS) IMPLANT
BLADE CLIPPER SURG (BLADE) ×2 IMPLANT
BNDG ESMARK 6X9 LF (GAUZE/BANDAGES/DRESSINGS)
CANISTER SUCT 3000ML PPV (MISCELLANEOUS) ×2 IMPLANT
CANNULA VESSEL 3MM 2 BLNT TIP (CANNULA) ×2 IMPLANT
CLIP FOGARTY SPRING 6M (CLIP) ×2 IMPLANT
CLIP VESOCCLUDE MED 24/CT (CLIP) ×2 IMPLANT
CLIP VESOCCLUDE SM WIDE 24/CT (CLIP) ×2 IMPLANT
COVER PROBE W GEL 5X96 (DRAPES) ×2 IMPLANT
CUFF TOURN SGL QUICK 24 (TOURNIQUET CUFF)
CUFF TOURN SGL QUICK 34 (TOURNIQUET CUFF)
CUFF TOURN SGL QUICK 42 (TOURNIQUET CUFF) IMPLANT
CUFF TRNQT CYL 24X4X16.5-23 (TOURNIQUET CUFF) IMPLANT
CUFF TRNQT CYL 34X4.125X (TOURNIQUET CUFF) IMPLANT
DERMABOND ADVANCED (GAUZE/BANDAGES/DRESSINGS) ×2
DERMABOND ADVANCED .7 DNX12 (GAUZE/BANDAGES/DRESSINGS) ×2 IMPLANT
DRAIN CHANNEL 15F RND FF W/TCR (WOUND CARE) IMPLANT
DRAPE C-ARM 42X72 X-RAY (DRAPES) IMPLANT
DRAPE HALF SHEET 40X57 (DRAPES) IMPLANT
DRAPE INCISE IOBAN 66X45 STRL (DRAPES) ×2 IMPLANT
ELECT REM PT RETURN 9FT ADLT (ELECTROSURGICAL) ×2
ELECTRODE REM PT RTRN 9FT ADLT (ELECTROSURGICAL) ×1 IMPLANT
EVACUATOR SILICONE 100CC (DRAIN) IMPLANT
GLOVE BIO SURGEON ST LM GN SZ9 (GLOVE) ×2 IMPLANT
GLOVE SRG 8 PF TXTR STRL LF DI (GLOVE) ×2 IMPLANT
GLOVE SURG UNDER POLY LF SZ7 (GLOVE) ×2 IMPLANT
GLOVE SURG UNDER POLY LF SZ7.5 (GLOVE) ×2 IMPLANT
GLOVE SURG UNDER POLY LF SZ8 (GLOVE) ×2
GOWN STRL REUS W/ TWL LRG LVL3 (GOWN DISPOSABLE) ×2 IMPLANT
GOWN STRL REUS W/ TWL XL LVL3 (GOWN DISPOSABLE) ×2 IMPLANT
GOWN STRL REUS W/TWL LRG LVL3 (GOWN DISPOSABLE) ×2
GOWN STRL REUS W/TWL XL LVL3 (GOWN DISPOSABLE) ×2
HEMOSTAT SPONGE AVITENE ULTRA (HEMOSTASIS) IMPLANT
INSERT FOGARTY SM (MISCELLANEOUS) ×2 IMPLANT
KIT BASIN OR (CUSTOM PROCEDURE TRAY) ×2 IMPLANT
KIT TURNOVER KIT B (KITS) ×2 IMPLANT
LOOP VESSEL MINI RED (MISCELLANEOUS) ×4 IMPLANT
NS IRRIG 1000ML POUR BTL (IV SOLUTION) ×4 IMPLANT
PACK PERIPHERAL VASCULAR (CUSTOM PROCEDURE TRAY) ×2 IMPLANT
PAD ARMBOARD 7.5X6 YLW CONV (MISCELLANEOUS) ×4 IMPLANT
SET MICROPUNCTURE 5F STIFF (MISCELLANEOUS) IMPLANT
STOPCOCK 4 WAY LG BORE MALE ST (IV SETS) IMPLANT
SUT ETHILON 3 0 PS 1 (SUTURE) IMPLANT
SUT GORETEX 5 0 TT13 24 (SUTURE) IMPLANT
SUT GORETEX 6.0 TT13 (SUTURE) IMPLANT
SUT MNCRL AB 4-0 PS2 18 (SUTURE) ×6 IMPLANT
SUT PROLENE 5 0 C 1 24 (SUTURE) ×6 IMPLANT
SUT PROLENE 6 0 BV (SUTURE) ×4 IMPLANT
SUT PROLENE 7 0 BV 1 (SUTURE) IMPLANT
SUT SILK 2 0 PERMA HAND 18 BK (SUTURE) ×2 IMPLANT
SUT SILK 2 0 SH (SUTURE) ×2 IMPLANT
SUT SILK 3 0 (SUTURE) ×2
SUT SILK 3-0 18XBRD TIE 12 (SUTURE) ×2 IMPLANT
SUT VIC AB 2-0 CT1 27 (SUTURE) ×2
SUT VIC AB 2-0 CT1 TAPERPNT 27 (SUTURE) ×2 IMPLANT
SUT VIC AB 3-0 SH 27 (SUTURE) ×4
SUT VIC AB 3-0 SH 27X BRD (SUTURE) ×4 IMPLANT
TAPE UMBILICAL COTTON 1/8X30 (MISCELLANEOUS) ×2 IMPLANT
TOWEL GREEN STERILE (TOWEL DISPOSABLE) ×2 IMPLANT
TRAY FOLEY MTR SLVR 16FR STAT (SET/KITS/TRAYS/PACK) ×2 IMPLANT
TUBING EXTENTION W/L.L. (IV SETS) IMPLANT
UNDERPAD 30X36 HEAVY ABSORB (UNDERPADS AND DIAPERS) ×2 IMPLANT
WATER STERILE IRR 1000ML POUR (IV SOLUTION) ×2 IMPLANT

## 2020-06-29 NOTE — H&P (Signed)
History and Physical Interval Note:  06/29/2020 8:24 AM  Gerald Carpenter  has presented today for surgery, with the diagnosis of PVD.  The various methods of treatment have been discussed with the patient and family. After consideration of risks, benefits and other options for treatment, the patient has consented to  Procedure(s): BYPASS GRAFT LEFT FEMORAL TO ABOVE KNEE POPLITEAL ARTERY (Left) as a surgical intervention.  The patient's history has been reviewed, patient examined, no change in status, stable for surgery.  I have reviewed the patient's chart and labs.  Questions were answered to the patient's satisfaction.    Left fem pop.  [name]  Patient name: Gerald Carpenter MRN: [id number]        DOB: April 08, 1950        Sex: male  REASON FOR CONSULT: 3 month follow-up for claudication of bilateral lower extremities  HPI: Gerald Carpenter is a 70 y.o. male, with history of tobacco abuse that presents for 3 month follow-up of lower extremity claudication.  When I first saw him he described pain in both calves when walking 1000 feet to 0.25 miles.  He had no rest pain or wounds.  We recommended conservative management with smoking cessation, walking therapies, and prescribed Pletal.  Overall today does not feel he is doing any better and potentially slightly worse.  States at times he cannot even walk to his car in the parking lot.  Just went to [place] and really couldn't do much according to him.  He has cut back on his smoking significantly and is down to 5 cigarettes a day.  He has taken the Pletal as prescribed with no improvement.  No rest pain or wounds.      Past Medical History:  Diagnosis Date  . Arthritis   . Nephritis    when pt. was 7 yrs. ols  . 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         Past Surgical History:  Procedure Laterality Date  . ANTERIOR LAT LUMBAR FUSION Right 02/05/2019   Procedure: ATTEMPTED ANTERIOR LATERAL INTERBODY FUSION;   Surgeon: Consuella Lose, MD;  Location: [place];  Service: Neurosurgery;  Laterality: Right;  RIGHT LATERAL TRANSPSOAS DISCECTOMY AND FUSION WITH ROBOTIC ASSISTED PEDICLE SCREW STABILIZATION, LUMBAR 4- LUMBAR 5  . COLONOSCOPY    . EYE SURGERY Bilateral 2013   lens implants for cataracts after Lasik  . KNEE ARTHROSCOPY Right 1980  . TONSILLECTOMY     at a very young age, but grew back  . TOTAL HIP ARTHROPLASTY Right 05/25/2019   Procedure: RIGHT TOTAL HIP ARTHROPLASTY ANTERIOR APPROACH;  Surgeon: Leandrew Koyanagi, MD;  Location: [place];  Service: Orthopedics;  Laterality: Right;         Family History  Problem Relation Age of Onset  . Pulmonary fibrosis Mother   . Heart disease Father     SOCIAL HISTORY: Social History        Socioeconomic History  . Marital status: Married    Spouse name: Not on file  . Number of children: Not on file  . Years of education: Not on file  . Highest education level: Not on file  Occupational History  . Not on file  Tobacco Use  . Smoking status: Current Some Day Smoker    Packs/day: 0.50    Years: 60.00    Pack years: 30.00    Types: Cigarettes  . Smokeless tobacco: Never Used  Vaping Use  . Vaping Use: Never used  Substance and  Sexual Activity  . 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 Drug use: Not Currently    Comment: prior usage of "speed"  when drinking  . Sexual activity: Not on file  Other Topics Concern  . Not on file  Social History Narrative   Recently moved here from [facility], [place]   Social Determinants of Health   Financial Resource Strain: Not on file  Food Insecurity: Not on file  Transportation Needs: Not on file  Physical Activity: Not on file  Stress: Not on file  Social Connections: Not on file  Intimate Partner Violence: Not on file    No Known Allergies        Current Outpatient Medications  Medication Sig Dispense Refill  .  cilostazol (PLETAL) 100 MG tablet Take 1 tablet (100 mg total) by mouth 2 (two) times daily before a meal. 60 tablet 11  . tamsulosin (FLOMAX) 0.4 MG CAPS capsule Take 1 capsule (0.4 mg total) by mouth daily. 30 capsule 3  . buPROPion (WELLBUTRIN SR) 150 MG 12 hr tablet Take 1 tablet (150 mg total) by mouth 2 (two) times daily. For the first 3 days, take one tablet by mouth daily. Starting day 4, take 1 tablet by mouth two times daily. Take for 12 weeks (Patient not taking: Reported on 04/12/2020) 63 tablet 3   No current facility-administered medications for this visit.    REVIEW OF SYSTEMS:  [X]  denotes positive finding, [ ]  denotes negative finding Cardiac  Comments:  Chest pain or chest pressure:    Shortness of breath upon exertion:    Short of breath when lying flat:    Irregular heart rhythm:        Vascular    Pain in calf, thigh, or hip brought on by ambulation: x Right leg worse today  Pain in feet at night that wakes you up from your sleep:     Blood clot in your veins:    Leg swelling:         Pulmonary    Oxygen at home:    Productive cough:     Wheezing:         Neurologic    Sudden weakness in arms or legs:     Sudden numbness in arms or legs:     Sudden onset of difficulty speaking or slurred speech:    Temporary loss of vision in one eye:     Problems with dizziness:         Gastrointestinal    Blood in stool:     Vomited blood:         Genitourinary    Burning when urinating:     Blood in urine:        Psychiatric    Major depression:         Hematologic    Bleeding problems:    Problems with blood clotting too easily:        Skin    Rashes or ulcers:        Constitutional    Fever or chills:      PHYSICAL EXAM:    Vitals:   06/14/20 0928  BP: 120/73  Pulse: 98  Resp: 18  Temp: 98.2 F (36.8 C)  TempSrc: Temporal  SpO2: 98%  Weight: 153  lb (69.4 kg)  Height: 5\' 8"  (1.727 m)    GENERAL: The patient is a well-nourished male, in no acute distress.  The vital signs are documented above. CARDIAC: There is a regular rate and rhythm.  VASCULAR:  Palpable femoral pulses both groins No palpable pedal pulses but feet are warm with no tissue loss PULMONARY: No respiratory distress. ABDOMEN: Soft and non-tender. MUSCULOSKELETAL: There are no major deformities or cyanosis. NEUROLOGIC: No focal weakness or paresthesias are detected. SKIN: There are no ulcers or rashes noted.   DATA:   ABIs 03/01/20 were 0.7 on the right biphasic and 0.65 on the left monophasic  Assessment/Plan:  70 year old male with history of tobacco abuse that presents for 72-month follow-up of bilateral lower extremity claudication.  We had previously recommended conservative management with smoking cessation, walking therapies, Pletal etc.  On follow-up today does not feel he is doing any better and is slightly worse with more symptoms in the right leg that has become more disabling.  At times he states he cannot even walk to his car in the parking lot.  After failing conservative management with worsening symptoms we recommended aortogram with lower extremity arteriogram and possible intervention.  I discussed risk and benefits including bleeding, infection, vessel injury etc.  Discussed that he may not have any endovascular options and require open surgical bypass at a later date.  We will get him scheduled for the Cath Lab next week.   [name], MD [facility] [place] Office: [phone]

## 2020-06-29 NOTE — Progress Notes (Signed)
PHARMACIST LIPID MONITORING   Gerald Carpenter is a 70 y.o. male admitted on 06/29/2020 for fem-pop bypass.  Pharmacy has been consulted to optimize lipid-lowering therapy with the indication of secondary prevention for clinical ASCVD.  Recent Labs:  Lipid Panel (last 6 months):   Lab Results  Component Value Date   CHOL 151 04/14/2020   TRIG 85.0 04/14/2020   HDL 30.30 (L) 04/14/2020   CHOLHDL 5 04/14/2020   VLDL 17.0 04/14/2020   LDLCALC 104 (H) 04/14/2020    Hepatic function panel (last 6 months):   Lab Results  Component Value Date   AST 12 (L) 06/29/2020   ALT 14 06/29/2020   ALKPHOS 47 06/29/2020   BILITOT 1.0 06/29/2020    SCr (since admission):   Serum creatinine: 1.54 mg/dL (H) 06/29/20 0636 Estimated creatinine clearance: 45 mL/min (A)  Current therapy and lipid therapy tolerance Current lipid-lowering therapy: atorvastatin 10mg   Previous lipid-lowering therapies (if applicable): n/a Documented or reported allergies or intolerances to lipid-lowering therapies (if applicable): none  Assessment:   Patient agrees with changes to lipid-lowering therapy  Plan:    1.Statin intensity (high intensity recommended for all patients regardless of the LDL):  Add or increase statin to high intensity.  2.Add ezetimibe (if any one of the following):   Not indicated at this time.  3.Refer to lipid clinic:   No  4.Follow-up with:  Primary care provider - Haydee Salter, MD  5.Follow-up labs after discharge:  Changes in lipid therapy were made. Check a lipid panel in 8-12 weeks then annually.     Erin Hearing PharmD., BCPS Clinical Pharmacist 06/29/2020 1:42 PM

## 2020-06-29 NOTE — Progress Notes (Signed)
Mobility Specialist - Progress Note   06/29/20 1538  Mobility  Activity Ambulated in hall  Level of Assistance Moderate assist, patient does 50-74%  Assistive Device Front wheel walker  Distance Ambulated (ft) 50 ft  Mobility Ambulated with assistance in hallway  Mobility Response Tolerated fair  Mobility performed by Mobility specialist  $Mobility charge 1 Mobility   Pre-mobility: 72 HR, 108/65 BP, 98% SpO2 Post-mobility: 70 HR, 96/79 BP, 98% SpO2  Pt mod assist to sit up on edge of bed, min guard during ambulation. Distance limited by lightheadedness. Pt back into bed w/o assistance after walk, call bell at side.   Pricilla Handler Mobility Specialist Mobility Specialist Phone: [phone]

## 2020-06-29 NOTE — Op Note (Signed)
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  Harvest of left leg great saphenous vein 2.  Left common femoral to above-knee popliteal artery bypass with ipsilateral nonreversed great saphenous vein  Surgeon: Dr. [name], MD  Assistant: Roxy Horseman, PA  Indications: Patient is a 70 year old male who presents with lifestyle limiting claudication worse in the left lower extremity.  He recently underwent arteriogram that showed flush left SFA occlusion with reconstitution of the distal SFA above-knee popliteal artery for a target.  He presents today for left common femoral to above-knee popliteal artery bypass after risk benefits discussed.  An assistant was needed for exposure and to expedite the case.  Findings: Left great saphenous vein was of excellent caliber and was harvested from the saphenofemoral junction to just below the knee.  Ultimately this was sewn subfascial subsartorial from the common femoral artery to the above-knee popliteal artery both end to side.  Excellent anterior tibial and dorsalis pedis signal at completion.  Anesthesia: General  EBL: <50 mL  Details: Patient was taken to the operating room after informed consent was obtained.  Placed on the operative table in the supine position.  General endotracheal anesthesia was induced.  The left leg and left groin were then prepped and draped in the usual sterile fashion.  Prior to prepping, I did mark the saphenous vein in the left leg from the saphenofemoral junction to just above the knee.  Timeout was subsequently performed to identify patient, procedure and site.  Initially started in the left groin and horizontal groin incision made just above the inguinal crease.  Dissected down through the subcutaneous tissue and opened femoral sheath longitudinal.  I got exposure of the common femoral  artery as well as SFA and profunda and the distal external iliac and all these were controlled Vesseloops.  The anterior wall of the common femoral was soft and good for sewing an anastomosis.  I then made two skip incisions in the left thigh medially.  We dissected out the great saphenous vein and all side branches were ligated between 3-0 silk ties and divided.  The saphenous vein was fully mobilized between the skin tunnels.  I made a subsequent small counterincision just below the knee over the great saphenous vein in order to adequately mobilize enough length.  I then went to the distal thigh where he had made an incision to harvest the great saphenous vein and I then dissected the sartorius muscle and reflected this posteriorly and got into the above-knee popliteal space where we dissected out the above-knee popliteal artery away from the popliteal veins and this was controlled Vesseloops proximally distally.  This was nice and soft and appeared to be adequate for sewing and anastomosis.  That point time the vein was then transected just below the knee over right angle clamp and and then passed between the skin tunnels and divided at the saphenofemoral junction over right angle clamp.  This was oversewn with a 5-0 Prolene.  We then reversed the vein and placed a vessel cannula and ultimately once we flushed it, it dilated nicely and no sidebranches needed to be repaired.  Then used a tunneler and tunneled from the above-knee popliteal exposure to the common femoral exposure subfascial subsartorial plane.  That point time we gave him 100 units/kg IV and checked an ACT to make sure greater than 250.  The vein was brought on the field  in non-reverse fashion and spatulated and then I placed a clamp on the distal external iliac with Vesseloops on the SFA profunda.  Common femorals opened 11 blade scalpel extended with Potts scissors.  I then sewed end-to-side anastomosis to the left common femoral artery with 5-0  Prolene parachute technique.  We did de-air everything prior to completion.  Once we came off clamps we had good pulse in the proximal bypass but no distal flow given that this was nonreversed and we had not lyse the valves.  We then used a valvulotome and carefully lysed all the valves and the vein graft and had good pulsatile flow distally.  Ultimately I then placed 2 medium clips distally and the vein was marked for orientation.  We then attached this to the tunneler and this was carefully tunneled subfascial subsartorial to the above-knee popliteal artery ensuring not to twist it.  Vesseloops were then used on the above-knee popliteal artery for proximal distal control and arteriotomy was made with a 11 blade scalpel Potts scissors and then I spatulated the distal end of the vein and sewn end to side to the above-knee popliteal artery.  Again everything was de-aired prior to completion.  We had an excellent palpable pulse in the bypass with a palpable pulse in the above-knee popliteal artery distally.  We had excellent signals in the anterior tibial and dorsalis pedis artery.  Protamine was given for reversal.  All the incisions were irrigated out with saline we closed the groin with 2-0 Vicryl 3-0 Vicryl 4-0 Monocryl Dermabond.  The distal vein harvest incisions and distal target incisions were closed with 3-0 Vicryl 4-0 Monocryl and Dermabond.  He remained stable and taken to recovery after awakened from anesthesia.  Complication: None  Condition: Stable  [name], MD [facility] [place] Office: [place]

## 2020-06-29 NOTE — Anesthesia Procedure Notes (Addendum)
Procedure Name: Intubation Date/Time: 06/29/2020 8:41 AM Performed by: Amadeo Garnet, CRNA Pre-anesthesia Checklist: Patient identified, Emergency Drugs available, Suction available and Patient being monitored Patient Re-evaluated:Patient Re-evaluated prior to induction Oxygen Delivery Method: Circle system utilized Preoxygenation: Pre-oxygenation with 100% oxygen Induction Type: IV induction Ventilation: Mask ventilation without difficulty Laryngoscope Size: Miller and 2 Grade View: Grade I Tube type: Oral Tube size: 7.5 mm Number of attempts: 1 Airway Equipment and Method: Stylet Placement Confirmation: ETT inserted through vocal cords under direct vision,  positive ETCO2,  CO2 detector and breath sounds checked- equal and bilateral Secured at: 22 cm Tube secured with: Tape Dental Injury: Teeth and Oropharynx as per pre-operative assessment  Comments: Performed by Jacqualine Code

## 2020-06-29 NOTE — Anesthesia Preprocedure Evaluation (Signed)
Anesthesia Evaluation  Patient identified by MRN, date of birth, ID band Patient awake    Reviewed: Allergy & Precautions, NPO status , Patient's Chart, lab work & pertinent test results  History of Anesthesia Complications (+) PONV  Airway Mallampati: II  TM Distance: >3 FB Neck ROM: Full    Dental  (+) Dental Advisory Given   Pulmonary Current Smoker,    breath sounds clear to auscultation       Cardiovascular + Peripheral Vascular Disease   Rhythm:Regular Rate:Normal     Neuro/Psych  Neuromuscular disease    GI/Hepatic negative GI ROS, Neg liver ROS,   Endo/Other  negative endocrine ROS  Renal/GU Renal disease     Musculoskeletal  (+) Arthritis ,   Abdominal   Peds  Hematology negative hematology ROS (+)   Anesthesia Other Findings   Reproductive/Obstetrics                             Lab Results  Component Value Date   WBC 17.2 (H) 05/26/2019   HGB 15.6 06/23/2020   HCT 46.0 06/23/2020   MCV 96.2 05/26/2019   PLT 150 05/26/2019   Lab Results  Component Value Date   CREATININE 1.50 (H) 06/23/2020   BUN 18 06/23/2020   NA 142 06/23/2020   K 4.4 06/23/2020   CL 106 06/23/2020   CO2 23 04/14/2020    Anesthesia Physical Anesthesia Plan  ASA: III  Anesthesia Plan: General   Post-op Pain Management:    Induction: Intravenous  PONV Risk Score and Plan: 2 and Dexamethasone, Ondansetron and Treatment may vary due to age or medical condition  Airway Management Planned: Oral ETT  Additional Equipment:   Intra-op Plan:   Post-operative Plan: Extubation in OR  Informed Consent: I have reviewed the patients History and Physical, chart, labs and discussed the procedure including the risks, benefits and alternatives for the proposed anesthesia with the patient or authorized representative who has indicated his/her understanding and acceptance.     Dental advisory  given  Plan Discussed with: CRNA  Anesthesia Plan Comments:         Anesthesia Quick Evaluation

## 2020-06-29 NOTE — Transfer of Care (Signed)
Immediate Anesthesia Transfer of Care Note  Patient: Gerald Carpenter  Procedure(s) Performed: BYPASS GRAFT LEFT COMMON FEMORAL TO ABOVE KNEE POPLITEAL ARTERY (Left Leg Upper) HARVEST OF LEFT GREATER SAPHENOUS VEIN (Left Leg Upper)  Patient Location: PACU  Anesthesia Type:General  Level of Consciousness: awake, alert  and oriented  Airway & Oxygen Therapy: Patient Spontanous Breathing and Patient connected to face mask oxygen  Post-op Assessment: Report given to RN and Post -op Vital signs reviewed and stable  Post vital signs: Reviewed and stable  Last Vitals:  Vitals Value Taken Time  BP 111/47   Temp    Pulse 75 06/29/20 1210  Resp 10 06/29/20 1210  SpO2 98 % 06/29/20 1210  Vitals shown include unvalidated device data.  Last Pain:  Vitals:   06/29/20 0654  PainSc: 0-No pain      Patients Stated Pain Goal: 2 (88/11/03 1594)  Complications: No complications documented.

## 2020-06-29 NOTE — Anesthesia Procedure Notes (Signed)
Arterial Line Insertion Start/End5/12/2020 8:00 AM, 06/29/2020 8:05 AM Performed by: Suzette Battiest, MD, Amadeo Garnet, CRNA  Patient location: Pre-op. Preanesthetic checklist: patient identified and risks and benefits discussed Lidocaine 1% used for infiltration Left, radial was placed Catheter size: 20 G Hand hygiene performed  and Seldinger technique used Allen's test indicative of satisfactory collateral circulation Attempts: 1 Procedure performed without using ultrasound guided technique. Following insertion, dressing applied and Biopatch. Post procedure assessment: normal  Patient tolerated the procedure well with no immediate complications. Additional procedure comments: Performed by Jacqualine Code .

## 2020-06-30 ENCOUNTER — Encounter (HOSPITAL_COMMUNITY): Payer: Self-pay | Admitting: Vascular Surgery

## 2020-06-30 LAB — CBC
HCT: 37.7 % — ABNORMAL LOW (ref 39.0–52.0)
Hemoglobin: 12.5 g/dL — ABNORMAL LOW (ref 13.0–17.0)
MCH: 31.6 pg (ref 26.0–34.0)
MCHC: 33.2 g/dL (ref 30.0–36.0)
MCV: 95.2 fL (ref 80.0–100.0)
Platelets: 153 10*3/uL (ref 150–400)
RBC: 3.96 MIL/uL — ABNORMAL LOW (ref 4.22–5.81)
RDW: 13.9 % (ref 11.5–15.5)
WBC: 18.4 10*3/uL — ABNORMAL HIGH (ref 4.0–10.5)
nRBC: 0 % (ref 0.0–0.2)

## 2020-06-30 LAB — BASIC METABOLIC PANEL
Anion gap: 5 (ref 5–15)
BUN: 18 mg/dL (ref 8–23)
CO2: 19 mmol/L — ABNORMAL LOW (ref 22–32)
Calcium: 6.7 mg/dL — ABNORMAL LOW (ref 8.9–10.3)
Chloride: 114 mmol/L — ABNORMAL HIGH (ref 98–111)
Creatinine, Ser: 1.16 mg/dL (ref 0.61–1.24)
GFR, Estimated: >60 mL/min (ref >60)
Glucose, Bld: 103 mg/dL — ABNORMAL HIGH (ref 70–99)
Potassium: 3.4 mmol/L — ABNORMAL LOW (ref 3.5–5.1)
Sodium: 138 mmol/L (ref 135–145)

## 2020-06-30 LAB — LIPID PANEL
Cholesterol: 99 mg/dL (ref 0–200)
HDL: 15 mg/dL — ABNORMAL LOW (ref >40)
LDL Cholesterol: 71 mg/dL (ref 0–99)
Total CHOL/HDL Ratio: 6.6 RATIO
Triglycerides: 66 mg/dL (ref <150)
VLDL: 13 mg/dL (ref 0–40)

## 2020-06-30 MED ORDER — CHLORHEXIDINE GLUCONATE CLOTH 2 % EX PADS
6.0000 | MEDICATED_PAD | Freq: Every day | CUTANEOUS | Status: DC
  Administered 2020-06-30 – 2020-07-01 (×2): 6 via TOPICAL

## 2020-06-30 NOTE — Plan of Care (Signed)
POC initiated and progressing. 

## 2020-06-30 NOTE — Progress Notes (Signed)
Mobility Specialist: Progress Note   06/30/20 1822  Mobility  Activity Ambulated in hall  Level of Assistance Contact guard assist, steadying assist  Assistive Device Front wheel walker  Distance Ambulated (ft) 380 ft  Mobility Ambulated with assistance in hallway  Mobility Response Tolerated well  Mobility performed by Mobility specialist  $Mobility charge 1 Mobility   Pre-Mobility: 85 HR Post-Mobility: 96 HR  Pt c/o 3/10 pain in his L knee, otherwise asx. Pt to BR and then to bed after walk per request.   Gerald Carpenter Mobility Specialist Mobility Specialist Phone: [phone]

## 2020-06-30 NOTE — Progress Notes (Signed)
Physical Therapy Evaluation Patient Details Name: Gerald Carpenter MRN: [id number] DOB: Sep 21, 1950 Today's Date: 06/30/2020   History of Present Illness  Pt is a 70 y/o male admitted 5/11 s/p  Left common femoral to above-knee popliteal artery bypass with ipsilateral nonreversed great saphenous vein. PMH includes:  arthritis, anterior lat lumbar fusion 01/2019, THA 05/2019.  Clinical Impression  Pt admitted with/for Fem-Pop BPG.  Pt presently needing min guard to supervision levels for mobility.  Expect pt to improve rapidly and be safe at home with intermittent assist from his wife.  Pt currently limited functionally due to the problems listed below.  (see problems list.)  Pt will benefit from PT to maximize function and safety to be able to get home safely with available assist.     Follow Up Recommendations No PT follow up    Equipment Recommendations  None recommended by PT    Recommendations for Other Services       Precautions / Restrictions Precautions Precautions: Fall      Mobility  Bed Mobility Overal bed mobility: Needs Assistance Bed Mobility: Supine to Sit     Supine to sit: Supervision     General bed mobility comments: increased time required    Transfers Overall transfer level: Needs assistance Equipment used: Rolling walker (2 wheeled) Transfers: Sit to/from Stand Sit to Stand: Min guard         General transfer comment: for safety/balance, cueing for hand placement  Ambulation/Gait Ambulation/Gait assistance: Supervision Gait Distance (Feet): 320 Feet Assistive device: Rolling walker (2 wheeled) Gait Pattern/deviations: Step-through pattern   Gait velocity interpretation: 1.31 - 2.62 ft/sec, indicative of limited community ambulator General Gait Details: generally steady, mildly antalgic,  slower gait speed, light use of the RW.  Stairs            Wheelchair Mobility    Modified Rankin (Stroke Patients Only)       Balance Overall  balance assessment: Needs assistance Sitting-balance support: No upper extremity supported;Feet supported Sitting balance-Leahy Scale: Good     Standing balance support: Bilateral upper extremity supported;During functional activity;No upper extremity supported Standing balance-Leahy Scale: Fair Standing balance comment: preferring use of the RW due to pain, but can stand without AD or external assist                             Pertinent Vitals/Pain Pain Assessment: Faces Faces Pain Scale: Hurts little more Pain Location: L knee, upper thigh incision Pain Descriptors / Indicators: Discomfort ("sting") Pain Intervention(s): Monitored during session    Home Living Family/patient expects to be discharged to:: Private residence Living Arrangements: Spouse/significant other Available Help at Discharge: Family;Available PRN/intermittently Type of Home: House Home Access: Stairs to enter Entrance Stairs-Rails: Right Entrance Stairs-Number of Steps: 3-4 Home Layout: Two level;1/2 bath on main level;Bed/bath upstairs Home Equipment: Walker - 2 wheels;Bedside commode Additional Comments: spouse works m-f full time days    Prior Function Level of Independence: Independent         Comments: retired, driving, limited iADLs     Hand Dominance   Dominant Hand: Right    Extremity/Trunk Assessment   Upper Extremity Assessment Upper Extremity Assessment: Overall WFL for tasks assessed    Lower Extremity Assessment Lower Extremity Assessment: Overall WFL for tasks assessed;LLE deficits/detail LLE Deficits / Details: Painful and stiff, but functional for w/bearing LLE: Unable to fully assess due to pain    Cervical / Trunk Assessment Cervical /  Trunk Assessment: Normal  Communication   Communication: No difficulties  Cognition Arousal/Alertness: Awake/alert Behavior During Therapy: WFL for tasks assessed/performed Overall Cognitive Status: Within Functional Limits  for tasks assessed                                        General Comments General comments (skin integrity, edema, etc.): vss    Exercises     Assessment/Plan    PT Assessment Patient needs continued PT services  PT Problem List Decreased activity tolerance;Decreased mobility;Decreased knowledge of use of DME;Pain;Decreased strength       PT Treatment Interventions DME instruction;Gait training;Stair training;Functional mobility training;Therapeutic activities;Patient/family education    PT Goals (Current goals can be found in the Care Plan section)  Acute Rehab PT Goals Patient Stated Goal: less pain PT Goal Formulation: With patient Time For Goal Achievement: 07/07/20 Potential to Achieve Goals: Good    Frequency Min 3X/week   Barriers to discharge        Co-evaluation               AM-PAC PT "6 Clicks" Mobility  Outcome Measure Help needed turning from your back to your side while in a flat bed without using bedrails?: None Help needed moving from lying on your back to sitting on the side of a flat bed without using bedrails?: A Little Help needed moving to and from a bed to a chair (including a wheelchair)?: A Little Help needed standing up from a chair using your arms (e.g., wheelchair or bedside chair)?: A Little Help needed to walk in hospital room?: A Little Help needed climbing 3-5 steps with a railing? : A Little 6 Click Score: 19    End of Session   Activity Tolerance: Patient tolerated treatment well Patient left: in bed;with call bell/phone within reach Nurse Communication: Mobility status PT Visit Diagnosis: Other abnormalities of gait and mobility (R26.89);Pain Pain - Right/Left: Left Pain - part of body: Knee;Leg    Time: 0355-9741 PT Time Calculation (min) (ACUTE ONLY): 22 min   Charges:   PT Evaluation $PT Eval Moderate Complexity: 1 Mod          06/30/2020  Ginger Carne., PT Acute Rehabilitation  Services [phone]  (pager) [phone]  (office)  Tessie Fass Genny Caulder 06/30/2020, 5:01 PM

## 2020-06-30 NOTE — Evaluation (Signed)
Occupational Therapy Evaluation Patient Details Name: Gerald Carpenter MRN: [id number] DOB: 09/15/1950 Today's Date: 06/30/2020    History of Present Illness Pt is a 70 y/o male admitted 5/11 s/p  Left common femoral to above-knee popliteal artery bypass with ipsilateral nonreversed great saphenous vein. PMH includes:  arthritis, anterior lat lumbar fusion 01/2019, THA 05/2019.   Clinical Impression   PTA patient independent and driving. Admitted for above and presenting with problem list below, including pain in L LE (groin incision and knee), impaired balance and decreased activity tolerance.  Patient completing ADLs with up to mod assist for LB dressing, min guard if standing during ADLs; transfers with min guard using RW and mobility using RW given min guard for safety.  He reports his spouse can assist as needed, reviewed compensatory techniques for LB ADLs.  Will follow acutely to optimize independence and safety with ADLs, but anticipate no further needs after dc home.     Follow Up Recommendations  No OT follow up;Supervision - Intermittent    Equipment Recommendations  None recommended by OT    Recommendations for Other Services       Precautions / Restrictions Precautions Precautions: Fall Restrictions Weight Bearing Restrictions: Yes LLE Weight Bearing: Weight bearing as tolerated      Mobility Bed Mobility Overal bed mobility: Needs Assistance Bed Mobility: Supine to Sit     Supine to sit: Min assist     General bed mobility comments: for L LE mgmt and trunk support to ascend minimally, increased time required    Transfers Overall transfer level: Needs assistance Equipment used: Rolling walker (2 wheeled) Transfers: Sit to/from Stand Sit to Stand: Min guard         General transfer comment: for safety/balance, cueing for hand placement    Balance Overall balance assessment: Needs assistance Sitting-balance support: No upper extremity supported;Feet  supported Sitting balance-Leahy Scale: Good     Standing balance support: Bilateral upper extremity supported;During functional activity;No upper extremity supported Standing balance-Leahy Scale: Poor Standing balance comment: relies on BUE support dynaimcally                           ADL either performed or assessed with clinical judgement   ADL Overall ADL's : Needs assistance/impaired     Grooming: Min guard;Standing   Upper Body Bathing: Set up;Sitting   Lower Body Bathing: Minimal assistance;Sit to/from stand   Upper Body Dressing : Set up;Sitting   Lower Body Dressing: Moderate assistance;Sit to/from stand Lower Body Dressing Details (indicate cue type and reason): discussed compensatory techniques, plans to wear slip on shoes and understands to don L LE first Toilet Transfer: Min guard;Ambulation;RW;BSC Toilet Transfer Details (indicate cue type and reason): cueing for hand placement         Functional mobility during ADLs: Min guard;Rolling walker General ADL Comments: pt limited by decreased ROM and pain in L LE, impaired balance     Vision   Vision Assessment?: No apparent visual deficits     Perception     Praxis      Pertinent Vitals/Pain Pain Assessment: 0-10 Pain Score: 4  Pain Location: L knee, upper thigh incision Pain Descriptors / Indicators: Discomfort ("sting") Pain Intervention(s): Limited activity within patient's tolerance;Monitored during session;Repositioned     Hand Dominance Right   Extremity/Trunk Assessment Upper Extremity Assessment Upper Extremity Assessment: Overall WFL for tasks assessed   Lower Extremity Assessment Lower Extremity Assessment: Defer to PT evaluation (s/p L  fem pop bypass)   Cervical / Trunk Assessment Cervical / Trunk Assessment: Normal   Communication Communication Communication: No difficulties   Cognition Arousal/Alertness: Awake/alert Behavior During Therapy: WFL for tasks  assessed/performed Overall Cognitive Status: Within Functional Limits for tasks assessed                                     General Comments  reports spouse will assist initally as needed, looking into sleeping in recliner or moving a bed to the first floor    Exercises     Shoulder Instructions      Home Living Family/patient expects to be discharged to:: Private residence Living Arrangements: Spouse/significant other Available Help at Discharge: Family;Available PRN/intermittently Type of Home: House Home Access: Stairs to enter [employer] of Steps: 3-4 Entrance Stairs-Rails: Right Home Layout: Two level;1/2 bath on main level;Bed/bath upstairs Alternate Level Stairs-Number of Steps: steps, landing, steps Alternate Level Stairs-Rails: Right Bathroom Shower/Tub: Walk-in shower;Door   [employer] Toilet: Standard     Home Equipment: Environmental consultant - 2 wheels;Bedside commode   Additional Comments: spouse works m-f full time days      Prior Functioning/Environment Level of Independence: Independent        Comments: retired, driving, limited iADLs        OT Problem List: Decreased activity tolerance;Decreased range of motion;Decreased knowledge of use of DME or AE;Decreased knowledge of precautions;Pain      OT Treatment/Interventions: Self-care/ADL training;DME and/or AE instruction;Patient/family education;Balance training;Therapeutic activities    OT Goals(Current goals can be found in the care plan section) Acute Rehab OT Goals Patient Stated Goal: less pain OT Goal Formulation: With patient Time For Goal Achievement: 07/14/20 Potential to Achieve Goals: Good  OT Frequency: Min 2X/week   Barriers to D/C:            Co-evaluation              AM-PAC OT "6 Clicks" Daily Activity     Outcome Measure Help from another person eating meals?: None Help from another person taking care of personal grooming?: A Little Help from another  person toileting, which includes using toliet, bedpan, or urinal?: A Little Help from another person bathing (including washing, rinsing, drying)?: A Little Help from another person to put on and taking off regular upper body clothing?: None Help from another person to put on and taking off regular lower body clothing?: A Little 6 Click Score: 20   End of Session Equipment Utilized During Treatment: Rolling walker Nurse Communication: Mobility status  Activity Tolerance: Patient tolerated treatment well Patient left: in chair;Other (comment);with nursing/sitter in room (at sink with NT)  OT Visit Diagnosis: Other abnormalities of gait and mobility (R26.89);Pain Pain - Right/Left: Left Pain - part of body: Knee;Leg                Time: 7371-0626 OT Time Calculation (min): 28 min Charges:  OT General Charges $OT Visit: 1 Visit OT Evaluation $OT Eval Moderate Complexity: 1 Mod OT Treatments $Self Care/Home Management : 8-22 mins  Jolaine Artist, OT Acute Rehabilitation Services Pager [phone] Office [phone]   Delight Stare 06/30/2020, 10:32 AM

## 2020-06-30 NOTE — Progress Notes (Signed)
Mobility Specialist - Progress Note   06/30/20 1216  Mobility  Activity Ambulated in hall  Level of Assistance Minimal assist, patient does 75% or more  Assistive Device Front wheel walker  Distance Ambulated (ft) 380 ft  Mobility Ambulated with assistance in hallway  Mobility Response Tolerated well  Mobility performed by Mobility specialist  $Mobility charge 1 Mobility   Pre-mobility: 72 HR During mobility: 90 HR Post-mobility: 77 HR  Pt min assist to sit up on edge of bed. He stated his knee pain improved as he walked. Pt back in bed after walk w/o assist.   Pricilla Handler Mobility Specialist Mobility Specialist Phone: [phone]

## 2020-06-30 NOTE — Progress Notes (Addendum)
   VASCULAR SURGERY ASSESSMENT & PLAN:   1 Day Post-Op Left common femoral to above-knee popliteal artery bypass with ipsilateral nonreversed great saphenous vein secondary to short distance lifestyle limiting claudication left lower extremity. LLE well perfused. VSS. Afebrile. Continue aspirin and statin. Excellent UOP. Hgb 12.5  DC ivfs  SUBJECTIVE:   Complaining of left knee pain. Tolerating diet and voiding spontaneously.  PHYSICAL EXAM:   Vitals:   06/29/20 2100 06/30/20 0014 06/30/20 0437 06/30/20 0511  BP: (!) 95/59 (!) 106/57 (!) 93/59 (!) 101/56  Pulse: 77 73 78 74  Resp: 14 15 (!) 21 17  Temp: 98 F (36.7 C) 97.8 F (36.6 C) 98.1 F (36.7 C) 98.1 F (36.7 C)  TempSrc: Oral Oral Oral Oral  SpO2: 95% 93% 93% 99%  Weight:      Height:       General appearance: Awake, alert in no apparent distress Cardiac: Heart rate and rhythm are regular Respirations: Nonlabored Incisions: Left groin, thigh and lower leg incisions are all well approximated without bleeding or hematoma. Extremities: Both feet are warm with intact sensation and motor function.  Left calf is soft. 1+ left DP pulse.  LABS:   Lab Results  Component Value Date   WBC 18.4 (H) 06/30/2020   HGB 12.5 (L) 06/30/2020   HCT 37.7 (L) 06/30/2020   MCV 95.2 06/30/2020   PLT 153 06/30/2020   Lab Results  Component Value Date   CREATININE 1.16 06/30/2020   Lab Results  Component Value Date   INR 1.1 06/29/2020   CBG (last 3)  No results for input(s): GLUCAP in the last 72 hours.  PROBLEM LIST:    Active Problems:   PAD (peripheral artery disease) (HCC)   CURRENT MEDS:   . aspirin EC  81 mg Oral Daily  . atorvastatin  40 mg Oral Daily  . Chlorhexidine Gluconate Cloth  6 each Topical Daily  . cilostazol  100 mg Oral BID AC  . docusate sodium  100 mg Oral Daily  . fluticasone  2 spray Each Nare q AM  . heparin  5,000 Units Subcutaneous Q8H  . pantoprazole  40 mg Oral Daily  . tamsulosin  0.4  mg Oral Daily   Barbie Banner, [place] Office: [phone] 06/30/2020   I have seen and evaluated the patient. I agree with the PA note as documented above.  Postop day 1 status post left common femoral to above-knee popliteal artery bypass with vein for short distance lifestyle limiting claudication.  He has a 1+ palpable DP pulse in the left foot.  All of his incisions look good.  Out of bed and mobilize today with therapy.  Foley and A-line have been discontinued.  Appreciate PT OT input.  [name], MD [facility] Office: [phone]

## 2020-07-01 MED ORDER — OXYCODONE-ACETAMINOPHEN 5-325 MG PO TABS: 1.0000 | ORAL_TABLET | ORAL | 0 refills | Status: DC | PRN

## 2020-07-01 MED ORDER — ATORVASTATIN CALCIUM 40 MG PO TABS: 40.0000 mg | ORAL_TABLET | Freq: Every day | ORAL | 1 refills | Status: DC

## 2020-07-01 NOTE — TOC Transition Note (Signed)
Transition of Care (TOC) - CM/SW Discharge Note Marvetta Gibbons RN, BSN Transitions of Care Unit 4E- RN Case Manager See Treatment Team for direct phone #    Patient Details  Name: Gerald Carpenter MRN: [id number] Date of Birth: 1950/12/31  Transition of Care [facility] CM/SW Contact:  Dawayne Patricia, RN Phone Number: 07/01/2020, 12:20 PM   Clinical Narrative:    Pt stable for transition home today, Pt from home with spouse. Notified by [facility] that pt has pre-op referral from Vascular office for any [facility] needs. They will f/u with pt post discharge.  Lattie Haw with [facility] aware of pt's discharge home today for follow up.    Final next level of care: [place] Barriers to Discharge: No Barriers Identified   Patient Goals and CMS Choice    NA    Discharge Placement               Home with [facility]        Discharge Plan and Services   Discharge Planning Services: NA            DME Arranged: N/A DME Agency: NA       HH Arranged: NA HH Agency: [facility] Date HH Agency Contacted: 07/01/20 Time [place]: 1220 Representative spoke with at [place]: [place] ([place]) Interventions     Readmission Risk Interventions Readmission Risk Prevention Plan 07/01/2020  Post Dischage Appt Complete  Medication Screening Complete  Transportation Screening Complete

## 2020-07-01 NOTE — Progress Notes (Signed)
D/C instructions given to patient. Wound care and medications reviewed. IV removed, clean and intact. Wife to escort pt home.  Clyde Canterbury, RN

## 2020-07-01 NOTE — Discharge Instructions (Signed)
 [facility] [place]  Discharge instructions  Lower Extremity Bypass Surgery  Please refer to the following instruction for your post-procedure care. Your surgeon or physician assistant will discuss any changes with you.  Activity  You are encouraged to walk as much as you can. You can slowly return to normal activities during the month after your surgery. Avoid strenuous activity and heavy lifting until your doctor tells you it's OK. Avoid activities such as vacuuming or swinging a golf club. Do not drive until your doctor give the OK and you are no longer taking prescription pain medications. It is also normal to have difficulty with sleep habits, eating and bowel movement after surgery. These will go away with time.  Bathing/Showering  You may shower after you go home. Do not soak in a bathtub, hot tub, or swim until the incision heals completely.  Incision Care  Clean your incision with mild soap and water. Shower every day. Pat the area dry with a clean towel. You do not need a bandage unless otherwise instructed. Do not apply any ointments or creams to your incision. If you have open wounds you will be instructed how to care for them or a visiting nurse may be arranged for you. If you have staples or sutures along your incision they will be removed at your post-op appointment. You may have skin glue on your incision. Do not peel it off. It will come off on its own in about one week. If you have a great deal of moisture in your groin, use a gauze help keep this area dry.  Diet  Resume your normal diet. There are no special food restrictions following this procedure. A low fat/ low cholesterol diet is recommended for all patients with vascular disease. In order to heal from your surgery, it is CRITICAL to get adequate nutrition. Your body requires vitamins, minerals, and protein. Vegetables are the best source of vitamins and minerals. Vegetables also provide the  perfect balance of protein. Processed food has little nutritional value, so try to avoid this.  Medications  Resume taking all your medications unless your doctor or nurse practitioner tells you not to. If your incision is causing pain, you may take over-the-counter pain relievers such as acetaminophen (Tylenol). If you were prescribed a stronger pain medication, please aware these medication can cause nausea and constipation. Prevent nausea by taking the medication with a snack or meal. Avoid constipation by drinking plenty of fluids and eating foods with high amount of fiber, such as fruits, vegetables, and grains. Take Colase 100 mg (an over-the-counter stool softener) twice a day as needed for constipation. Do not take Tylenol if you are taking prescription pain medications.  Follow Up  Our office will schedule a follow up appointment 2-3 weeks following discharge.  Please call us immediately for any of the following conditions  Severe or worsening pain in your legs or feet while at rest or while walking Increase pain, redness, warmth, or drainage (pus) from your incision site(s) Fever of 101 degree or higher The swelling in your leg with the bypass suddenly worsens and becomes more painful than when you were in the hospital If you have been instructed to feel your graft pulse then you should do so every day. If you can no longer feel this pulse, call the office immediately. Not all patients are given this instruction.  Leg swelling is common after leg bypass surgery.  The swelling should improve over a few months   following surgery. To improve the swelling, you may elevate your legs above the level of your heart while you are sitting or resting. Your surgeon or physician assistant may ask you to apply an ACE wrap or wear compression (TED) stockings to help to reduce swelling.  Reduce your risk of vascular disease  Stop smoking. If you would like help call [facility] at 1-800-QUIT-NOW  (1-800-784-8669) or [place] at 336-586-4000.  Manage your cholesterol Maintain a desired weight Control your diabetes weight Control your diabetes Keep your blood pressure down  If you have any questions, please call the office at [phone]   

## 2020-07-01 NOTE — Progress Notes (Signed)
Mobility Specialist: Progress Note   07/01/20 1358  Mobility  Activity Ambulated in hall  Level of Assistance Standby assist, set-up cues, supervision of patient - no hands on  Assistive Device Front wheel walker  Distance Ambulated (ft) 380 ft  Mobility Ambulated with assistance in hallway  Mobility Response Tolerated well  Mobility performed by Mobility specialist  $Mobility charge 1 Mobility   Post-Mobility: 106 HR, 95% SpO2  Pt c/o stiffness in LLE, otherwise asx. Pt is hopeful for discharge soon.   [facility] Trammell Bowden Mobility Specialist Mobility Specialist Phone: [phone]

## 2020-07-01 NOTE — Progress Notes (Addendum)
[facility]  Subjective  - Tenderness at all incisions and swelling in the left leg.     Objective 111/62 79 98 F (36.7 C) (Oral) 14 97%  Intake/Output Summary (Last 24 hours) at 07/01/2020 0740 Last data filed at 07/01/2020 0428 Gross per 24 hour  Intake --  Output 3000 ml  Net -3000 ml    Brisk doppler PT/DP/Peroneal Groin incision soft without hematoma Mild edema in the left LE Lungs non labored breathing  Assessment/Planning: POD # 2  Left common femoral to above-knee popliteal artery bypass with ipsilateral nonreversedgreat saphenous vein secondary to short distance lifestyle limiting claudication left lower extremity.  Working on pain control with ambulation Discharge pending on mobility today. Encouraged elevation of the left LE above the heart.  Roxy Horseman 07/01/2020 7:40 AM --  Laboratory Lab Results: Recent Labs    06/29/20 0636 06/30/20 0524  WBC 15.8* 18.4*  HGB 16.7 12.5*  HCT 50.5 37.7*  PLT 178 153   BMET Recent Labs    06/29/20 0636 06/30/20 0524  NA 138 138  K 4.4 3.4*  CL 108 114*  CO2 21* 19*  GLUCOSE 100* 103*  BUN 21 18  CREATININE 1.54* 1.16  CALCIUM 8.9 6.7*    COAG Lab Results  Component Value Date   INR 1.1 06/29/2020   INR 1.0 05/21/2019   INR 1.0 02/11/2019   No results found for: PTT  I have seen and evaluated the patient. I agree with the PA note as documented above.  Postop day 2 status post left common femoral to above-knee popliteal artery bypass for lifestyle limiting short distance claudication.  Has a palpable dorsalis pedis pulse in the left foot.  All of incisions look good.  He feels appropriate for discharge today.  Discussed plan for aspirin and statin at discharge.  Will arrange follow-up in 2 to 3 weeks for incision checks.  PT/OT recommended no follow-up.  [name], MD [facility] Office: [phone]

## 2020-07-01 NOTE — Progress Notes (Signed)
Occupational Therapy Treatment Patient Details Name: Gerald Carpenter MRN: [id number] DOB: 1950/12/06 Today's Date: 07/01/2020    History of present illness Pt is a 70 y/o male admitted 5/11 s/p  Left common femoral to above-knee popliteal artery bypass with ipsilateral nonreversed great saphenous vein. PMH includes:  arthritis, anterior lat lumbar fusion 01/2019, THA 05/2019.   OT comments  Patient progressing well, still limited by pain and stiffness in L LE.  Completing transfers and mobility with supervision, LB dressing with min assist and grooming with supervision.  Will have spouse assist at dc as needed.     Follow Up Recommendations  No OT follow up;Supervision - Intermittent    Equipment Recommendations  None recommended by OT    Recommendations for Other Services      Precautions / Restrictions Precautions Precautions: Fall       Mobility Bed Mobility Overal bed mobility: Needs Assistance Bed Mobility: Supine to Sit;Sit to Supine     Supine to sit: Supervision Sit to supine: Supervision   General bed mobility comments: increased time required    Transfers Overall transfer level: Needs assistance Equipment used: Rolling walker (2 wheeled) Transfers: Sit to/from Stand Sit to Stand: Supervision         General transfer comment: for safety    Balance Overall balance assessment: Needs assistance Sitting-balance support: No upper extremity supported;Feet supported Sitting balance-Leahy Scale: Good     Standing balance support: Bilateral upper extremity supported;During functional activity;No upper extremity supported Standing balance-Leahy Scale: Fair Standing balance comment: preferring use of the RW due to pain, but can stand without AD or external assist                           ADL either performed or assessed with clinical judgement   ADL Overall ADL's : Needs assistance/impaired     Grooming: Oral care;Supervision/safety;Standing                Lower Body Dressing: Minimal assistance;Sit to/from stand Lower Body Dressing Details (indicate cue type and reason): will have spouse assist, continues to have difficulty reaching L LE Toilet Transfer: Supervision/safety;Ambulation   Toileting- Clothing Manipulation and Hygiene: Modified independent;Sit to/from stand   Tub/ Shower Transfer: Walk-in shower;Supervision/safety;Ambulation;Grab bars Tub/Shower Transfer Details (indicate cue type and reason): simulated in room Functional mobility during ADLs: Supervision/safety;Rolling walker       Vision   Vision Assessment?: No apparent visual deficits   Perception     Praxis      Cognition Arousal/Alertness: Awake/alert Behavior During Therapy: WFL for tasks assessed/performed Overall Cognitive Status: Within Functional Limits for tasks assessed                                          Exercises     Shoulder Instructions       General Comments VSS    Pertinent Vitals/ Pain       Pain Assessment: Faces Faces Pain Scale: Hurts little more Pain Location: L knee, upper thigh incision Pain Descriptors / Indicators: Discomfort Pain Intervention(s): Monitored during session  Home Living                                          Prior Functioning/Environment  Frequency  Min 2X/week        Progress Toward Goals  OT Goals(current goals can now be found in the care plan section)  Progress towards OT goals: Progressing toward goals  Acute Rehab OT Goals Patient Stated Goal: less pain OT Goal Formulation: With patient  Plan Discharge plan remains appropriate;Frequency remains appropriate    Co-evaluation                 AM-PAC OT "6 Clicks" Daily Activity     Outcome Measure   Help from another person eating meals?: None Help from another person taking care of personal grooming?: A Little Help from another person toileting, which  includes using toliet, bedpan, or urinal?: A Little Help from another person bathing (including washing, rinsing, drying)?: A Little Help from another person to put on and taking off regular upper body clothing?: None Help from another person to put on and taking off regular lower body clothing?: A Little 6 Click Score: 20    End of Session Equipment Utilized During Treatment: Rolling walker  OT Visit Diagnosis: Other abnormalities of gait and mobility (R26.89);Pain Pain - Right/Left: Left Pain - part of body: Knee;Leg   Activity Tolerance Patient tolerated treatment well   Patient Left in bed;with call bell/phone within reach   Nurse Communication Mobility status        Time: 4315-4008 OT Time Calculation (min): 20 min  Charges: OT General Charges $OT Visit: 1 Visit OT Treatments $Self Care/Home Management : 8-22 mins  Jolaine Artist, OT Acute Rehabilitation Services Pager [phone] Office [phone]    Gerald Carpenter 07/01/2020, 10:45 AM

## 2020-07-01 NOTE — Progress Notes (Signed)
Physical Therapy Treatment Patient Details Name: Gerald Carpenter MRN: [id number] DOB: October 24, 1950 Today's Date: 07/01/2020    History of Present Illness Pt is a 70 y/o male admitted 5/11 s/p  Left common femoral to above-knee popliteal artery bypass with ipsilateral nonreversed great saphenous vein. PMH includes:  arthritis, anterior lat lumbar fusion 01/2019, THA 05/2019.    PT Comments    Pt received in supine with LLE elevated, agreeable to therapy session and with good participation for stair trial. Emphasis on safe gait progression community distances with RW, safety with sit<>stand transfers and HEP instruction. Pt continues to benefit from PT services to progress toward functional mobility goals. Pt needing min guard at most for functional mobility tasks. Discussed with Supervising PT Bunnie Philips, pt would benefit from OPPT upon discharge so recommendation updated as pt agreeable.    Follow Up Recommendations  Outpatient PT     Equipment Recommendations  None recommended by PT    Recommendations for Other Services       Precautions / Restrictions Precautions Precautions: Fall    Mobility  Bed Mobility Overal bed mobility: Needs Assistance Bed Mobility: Supine to Sit;Sit to Supine     Supine to sit: Modified independent (Device/Increase time) Sit to supine: Modified independent (Device/Increase time)   General bed mobility comments: no cues needed, increased time to perform    Transfers Overall transfer level: Needs assistance Equipment used: Rolling walker (2 wheeled) Transfers: Sit to/from Stand Sit to Stand: Supervision         General transfer comment: verbal cues for technique  Ambulation/Gait Ambulation/Gait assistance: Supervision Gait Distance (Feet): 300 Feet Assistive device: Rolling walker (2 wheeled) Gait Pattern/deviations: Step-through pattern;Antalgic   Gait velocity interpretation: 1.31 - 2.62 ft/sec, indicative of limited community  ambulator General Gait Details: generally steady, mildly antalgic,  slower gait speed, light use of the RW. occasional cues for proximity to RW to use arms more due to LE pain   Stairs Stairs: Yes Stairs assistance: Min guard Stair Management: One rail Right;Step to pattern;Forwards Number of Stairs: 3 General stair comments: min guard via LUE HHA and R rail ascending then backward descending, no LOB but needs cues to reinforce technique; also printed HEP handout with stair instruction   Wheelchair Mobility    Modified Rankin (Stroke Patients Only)       Balance Overall balance assessment: Needs assistance Sitting-balance support: No upper extremity supported;Feet supported Sitting balance-Leahy Scale: Good     Standing balance support: Bilateral upper extremity supported;During functional activity;No upper extremity supported Standing balance-Leahy Scale: Fair Standing balance comment: preferring use of the RW due to pain, but can stand without AD or external assist                            Cognition Arousal/Alertness: Awake/alert Behavior During Therapy: WFL for tasks assessed/performed Overall Cognitive Status: Within Functional Limits for tasks assessed                                        Exercises General Exercises - Lower Extremity Ankle Circles/Pumps: AROM;Both;10 reps;Supine Quad Sets: AROM;Left;5 reps;Supine Long Arc Quad: Left;AROM;5 reps;Seated Heel Slides: AROM;Left;5 reps;Supine Hip ABduction/ADduction: AROM;Left;10 reps;Supine    General Comments General comments (skin integrity, edema, etc.): HR 66 bpm resting and up to 81 bpm with ambulation; pt reports he will likely sleep in recliner chair  Pertinent Vitals/Pain Pain Assessment: Faces Faces Pain Scale: Hurts little more Pain Location: L knee, upper thigh incision Pain Descriptors / Indicators: Discomfort Pain Intervention(s): Monitored during  session;Repositioned    Home Living                      Prior Function            PT Goals (current goals can now be found in the care plan section) Acute Rehab PT Goals Patient Stated Goal: less pain PT Goal Formulation: With patient Time For Goal Achievement: 07/07/20 Potential to Achieve Goals: Good Progress towards PT goals: Progressing toward goals    Frequency    Min 3X/week      PT Plan Current plan remains appropriate    Co-evaluation              AM-PAC PT "6 Clicks" Mobility   Outcome Measure  Help needed turning from your back to your side while in a flat bed without using bedrails?: None Help needed moving from lying on your back to sitting on the side of a flat bed without using bedrails?: A Little Help needed moving to and from a bed to a chair (including a wheelchair)?: A Little Help needed standing up from a chair using your arms (e.g., wheelchair or bedside chair)?: A Little Help needed to walk in hospital room?: A Little Help needed climbing 3-5 steps with a railing? : A Little 6 Click Score: 19    End of Session Equipment Utilized During Treatment: Gait belt Activity Tolerance: Patient tolerated treatment well Patient left: in bed;with call bell/phone within reach (LLE elevated) Nurse Communication: Mobility status PT Visit Diagnosis: Other abnormalities of gait and mobility (R26.89);Pain Pain - Right/Left: Left Pain - part of body: Knee;Leg     Time: 9518-8416 PT Time Calculation (min) (ACUTE ONLY): 16 min  Charges:  $Gait Training: 8-22 mins                     Gareld Obrecht P., PTA Acute Rehabilitation Services Pager: [phone] Office: [place] 07/01/2020, 12:39 PM

## 2020-07-01 NOTE — Anesthesia Postprocedure Evaluation (Signed)
Anesthesia Post Note  Patient: Salim Forero  Procedure(s) Performed: BYPASS GRAFT LEFT COMMON FEMORAL TO ABOVE KNEE POPLITEAL ARTERY (Left Leg Upper) HARVEST OF LEFT GREATER SAPHENOUS VEIN (Left Leg Upper)     Patient location during evaluation: PACU Anesthesia Type: General Level of consciousness: awake and alert Pain management: pain level controlled Vital Signs Assessment: post-procedure vital signs reviewed and stable Respiratory status: spontaneous breathing, nonlabored ventilation, respiratory function stable and patient connected to nasal cannula oxygen Cardiovascular status: blood pressure returned to baseline and stable Postop Assessment: no apparent nausea or vomiting Anesthetic complications: no   No complications documented.  Last Vitals:  Vitals:   07/01/20 0815 07/01/20 1212  BP: 112/64 (!) 97/53  Pulse: 74 86  Resp: 20 15  Temp: 36.7 C 36.4 C  SpO2: 91% 98%    Last Pain:  Vitals:   07/01/20 1212  TempSrc: Oral  PainSc: 0-No pain   Pain Goal: Patients Stated Pain Goal: 4 (06/29/20 1210)                 Tiajuana Amass

## 2020-07-04 NOTE — Discharge Summary (Signed)
[facility] Discharge Summary   Patient ID:  Gerald Carpenter MRN: [id number] DOB/AGE: 1950/06/19 70 y.o.  Admit date: 06/28/2020 Discharge date: 07/01/20 Date of Surgery: 06/29/2020 Surgeon: * Surgery not found *  Admission Diagnosis: No admission diagnoses are documented for this encounter.  Discharge Diagnoses:  No admission diagnoses are documented for this encounter.  Secondary Diagnoses: Past Medical History:  Diagnosis Date  . Arthritis   . Complication of anesthesia   . Family history of adverse reaction to anesthesia    daughter- N/V  . Nephritis    when pt. was 7 yrs. ols  . Peripheral vascular disease ([place])   . PONV (postoperative nausea and vomiting)    "Only when I have aortagram on 06/23/20".  . 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    Procedure(s): BYPASS GRAFT LEFT COMMON FEMORAL TO ABOVE KNEE POPLITEAL ARTERY HARVEST OF LEFT GREATER SAPHENOUS VEIN  Discharged Condition: stable  HPI: Patient is a 70 year old male who presents with lifestyle limiting claudication worse in the left lower extremity.  He recently underwent arteriogram that showed flush left SFA occlusion with reconstitution of the distal SFA above-knee popliteal artery for a target.    Hospital Course:  Gerald Carpenter is a 70 y.o. male is S/P  Procedure(s): BYPASS GRAFT LEFT COMMON FEMORAL TO ABOVE KNEE POPLITEAL ARTERY HARVEST OF LEFT GREATER SAPHENOUS VEIN Palpable left DP post op.  Incision healing well. He was discharged post op day 2 in stable condition.  We discussed elevation of the left LE when at rest.  He will continue  Patient is a 70 year old male who presents with lifestyle limiting claudication worse in the left lower extremity.  He recently underwent arteriogram that showed flush left SFA occlusion with reconstitution of the distal SFA above-knee popliteal artery for a target.      Significant Diagnostic Studies: CBC Lab Results  Component Value Date    WBC 18.4 (H) 06/30/2020   HGB 12.5 (L) 06/30/2020   HCT 37.7 (L) 06/30/2020   MCV 95.2 06/30/2020   PLT 153 06/30/2020    BMET    Component Value Date/Time   NA 138 06/30/2020 0524   K 3.4 (L) 06/30/2020 0524   CL 114 (H) 06/30/2020 0524   CO2 19 (L) 06/30/2020 0524   GLUCOSE 103 (H) 06/30/2020 0524   BUN 18 06/30/2020 0524   CREATININE 1.16 06/30/2020 0524   CALCIUM 6.7 (L) 06/30/2020 0524   GFRNONAA >60 06/30/2020 0524   GFRAA >60 05/26/2019 0457   COAG Lab Results  Component Value Date   INR 1.1 06/29/2020   INR 1.0 05/21/2019   INR 1.0 02/11/2019     Disposition:  Discharge to :Home     Medication List    STOP taking these medications   cilostazol 100 MG tablet Commonly known as: PLETAL     TAKE these medications   aspirin EC 81 MG tablet Take 1 tablet (81 mg total) by mouth daily. Swallow whole. Notes to patient: Tomorrow morning 5/14   atorvastatin 40 MG tablet Commonly known as: LIPITOR Take 1 tablet (40 mg total) by mouth daily. What changed:   medication strength  how much to take   fluticasone 50 MCG/ACT nasal spray Commonly known as: FLONASE Place 2 sprays into both nostrils in the morning.   oxyCODONE-acetaminophen 5-325 MG tablet Commonly known as: PERCOCET/ROXICET Take 1-2 tablets by mouth every 4 (four) hours as needed for moderate pain.   tamsulosin 0.4 MG Caps capsule Commonly known as:  FLOMAX Take 1 capsule (0.4 mg total) by mouth daily. What changed: when to take this   vitamin C 500 MG tablet Commonly known as: ASCORBIC ACID Take 500 mg by mouth in the morning.   Vitamin D3 250 MCG (10000 UT) Tabs Take 10,000 Units by mouth in the morning.      Verbal and written Discharge instructions given to the patient. Wound care per Discharge AVS   Signed: Roxy Horseman 07/04/2020, 9:37 AM - For [facility]QI Registry use --- Instructions: Press F2 to tab through selections.  Delete question if not applicable.    Post-op:  Wound infection: No  Graft infection: No  Transfusion: No  If yes, 0 units given New Arrhythmia: No Ipsilateral amputation: [x ] no, [ ]  Minor, [ ]  BKA, [ ]  AKA Discharge patency: [x ] Primary, [ ]  Primary assisted, [ ]  Secondary, [ ]  Occluded Patency judged by: [ ]  Dopper only, [ ]  Palpable graft pulse, [x ] Palpable distal pulse, [ ]  ABI inc. > 0.15, [ ]  Duplex  D/C Ambulatory Status: Ambulatory  Complications: MI: [x ] No, [ ]  Troponin only, [ ]  EKG or Clinical CHF: No Resp failure: [x ] none, [ ]  Pneumonia, [ ]  Ventilator Chg in renal function: [x ] none, [ ]  Inc. Cr > 0.5, [ ]  Temp. Dialysis, [ ]  Permanent dialysis Stroke: [x ] None, [ ]  Minor, [ ]  Major Return to OR: No  Reason for return to OR: [ ]  Bleeding, [ ]  Infection, [ ]  Thrombosis, [ ]  Revision  Discharge medications: Statin use:  Yes ASA use:  Yes Plavix use:  No  for medical reason   Beta blocker use: No  for medical reason   Coumadin use: No  for medical reason

## 2020-07-04 NOTE — Discharge Summary (Addendum)
[facility] Discharge Summary   Patient ID:  Gerald Carpenter MRN: [id number] DOB/AGE: [birth year] 70 y.o.  Admit date: 06/29/2020 Discharge date: 07/04/2020 Date of Surgery: 06/29/2020 Surgeon: Surgeon(s): [name], MD  Admission Diagnosis: PAD (peripheral artery disease) [facility]) [I73.9]  Discharge Diagnoses:  PAD (peripheral artery disease) ([place]) [I73.9]  Secondary Diagnoses: Past Medical History:  Diagnosis Date  . Arthritis   . Complication of anesthesia   . Family history of adverse reaction to anesthesia    daughter- N/V  . Nephritis    when pt. was 7 yrs. ols  . Peripheral vascular disease ([place])   . PONV (postoperative nausea and vomiting)    "Only when I have aortagram on 06/23/20".  . 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    Procedure(s): BYPASS GRAFT LEFT COMMON FEMORAL TO ABOVE KNEE POPLITEAL ARTERY HARVEST OF LEFT GREATER SAPHENOUS VEIN  Discharged Condition: stable  HPI: Patient is a 70 year old male who presents with lifestyle limiting claudication worse in the left lower extremity.  He recently underwent arteriogram that showed flush left SFA occlusion with reconstitution of the distal SFA above-knee popliteal artery for a target.    Hospital Course:  Gerald Carpenter is a 69 y.o. male is S/P  Procedure(s): BYPASS GRAFT LEFT COMMON FEMORAL TO ABOVE KNEE POPLITEAL ARTERY HARVEST OF LEFT GREATER SAPHENOUS VEIN Palpable left DP post op.  Incision healing well. He was discharged post op day 2 in stable condition.  We discussed elevation of the left LE when at rest.  He will continue .  F/U has been arranged.     Significant Diagnostic Studies: CBC Lab Results  Component Value Date   WBC 18.4 (H) 06/30/2020   HGB 12.5 (L) 06/30/2020   HCT 37.7 (L) 06/30/2020   MCV 95.2 06/30/2020   PLT 153 06/30/2020    BMET    Component Value Date/Time   NA 138 06/30/2020 0524   K 3.4 (L) 06/30/2020 0524   CL 114 (H) 06/30/2020  0524   CO2 19 (L) 06/30/2020 0524   GLUCOSE 103 (H) 06/30/2020 0524   BUN 18 06/30/2020 0524   CREATININE 1.16 06/30/2020 0524   CALCIUM 6.7 (L) 06/30/2020 0524   GFRNONAA >60 06/30/2020 0524   GFRAA >60 05/26/2019 0457   COAG Lab Results  Component Value Date   INR 1.1 06/29/2020   INR 1.0 05/21/2019   INR 1.0 02/11/2019     Disposition:  Discharge to :Home Discharge Instructions    Call MD for:  redness, tenderness, or signs of infection (pain, swelling, bleeding, redness, odor or green/yellow discharge around incision site)   Complete by: As directed    Call MD for:  severe or increased pain, loss or decreased feeling  in affected limb(s)   Complete by: As directed    Call MD for:  temperature >100.5   Complete by: As directed    Resume previous diet   Complete by: As directed      Allergies as of 07/01/2020   No Active Allergies     Medication List    STOP taking these medications   cilostazol 100 MG tablet Commonly known as: PLETAL     TAKE these medications   aspirin EC 81 MG tablet Take 1 tablet (81 mg total) by mouth daily. Swallow whole. Notes to patient: Tomorrow morning 5/14   atorvastatin 40 MG tablet Commonly known as: LIPITOR Take 1 tablet (40 mg total) by mouth daily. What changed:   medication strength  how much to take   fluticasone 50 MCG/ACT nasal spray Commonly known as: FLONASE Place 2 sprays into both nostrils in the morning.   oxyCODONE-acetaminophen 5-325 MG tablet Commonly known as: PERCOCET/ROXICET Take 1-2 tablets by mouth every 4 (four) hours as needed for moderate pain.   tamsulosin 0.4 MG Caps capsule Commonly known as: FLOMAX Take 1 capsule (0.4 mg total) by mouth daily. What changed: when to take this   vitamin C 500 MG tablet Commonly known as: ASCORBIC ACID Take 500 mg by mouth in the morning.   Vitamin D3 250 MCG (10000 UT) Tabs Take 10,000 Units by mouth in the morning.      Verbal and written Discharge  instructions given to the patient. Wound care per Discharge AVS  Follow-up Information    [facility] Follow up.   Specialty: [place] Why: pre-op referral from Vascular office for any [facility] needs- they will f/u with you post discharge Contact information: [place] [address] [phone]        [name], MD Follow up in 2 week(s).   Specialty: Vascular Surgery Contact information: [place] [address] [phone]               Signed: Roxy Horseman 07/04/2020, 9:21 AM - For VQI Registry use --- Instructions: Press F2 to tab through selections.  Delete question if not applicable.   Post-op:  Wound infection: No  Graft infection: No  Transfusion: No  If yes, 0 units given New Arrhythmia: No Ipsilateral amputation: [x ] no, [ ]  Minor, [ ]  BKA, [ ]  AKA Discharge patency: [x ] Primary, [ ]  Primary assisted, [ ]  Secondary, [ ]  Occluded Patency judged by: [ ]  Dopper only, [ ]  Palpable graft pulse, [x ] Palpable distal pulse, [ ]  ABI inc. > 0.15, [ ]  Duplex  D/C Ambulatory Status: Ambulatory  Complications: MI: [x ] No, [ ]  Troponin only, [ ]  EKG or Clinical CHF: No Resp failure: [x ] none, [ ]  Pneumonia, [ ]  Ventilator Chg in renal function: [x ] none, [ ]  Inc. Cr > 0.5, [ ]  Temp. Dialysis, [ ]  Permanent dialysis Stroke: [x ] None, [ ]  Minor, [ ]  Major Return to OR: No  Reason for return to OR: [ ]  Bleeding, [ ]  Infection, [ ]  Thrombosis, [ ]  Revision  Discharge medications: Statin use:  Yes ASA use:  Yes Plavix use:  No  for medical reason   Beta blocker use: No  for medical reason   Coumadin use: No  for medical reason

## 2020-07-11 ENCOUNTER — Other Ambulatory Visit: Payer: Self-pay

## 2020-07-11 ENCOUNTER — Ambulatory Visit (INDEPENDENT_AMBULATORY_CARE_PROVIDER_SITE_OTHER): Payer: Medicare Other | Admitting: Family Medicine

## 2020-07-11 ENCOUNTER — Encounter: Payer: Self-pay | Admitting: Family Medicine

## 2020-07-11 VITALS — BP 124/66 | HR 86 | Temp 98.1°F | Ht 68.0 in | Wt 152.8 lb

## 2020-07-11 DIAGNOSIS — Z1159 Encounter for screening for other viral diseases: Secondary | ICD-10-CM

## 2020-07-11 DIAGNOSIS — I70212 Atherosclerosis of native arteries of extremities with intermittent claudication, left leg: Secondary | ICD-10-CM | POA: Diagnosis not present

## 2020-07-11 DIAGNOSIS — R399 Unspecified symptoms and signs involving the genitourinary system: Secondary | ICD-10-CM

## 2020-07-11 DIAGNOSIS — N1832 Chronic kidney disease, stage 3b: Secondary | ICD-10-CM | POA: Diagnosis not present

## 2020-07-11 DIAGNOSIS — Z716 Tobacco abuse counseling: Secondary | ICD-10-CM

## 2020-07-11 LAB — BASIC METABOLIC PANEL
BUN: 26 mg/dL — ABNORMAL HIGH (ref 6–23)
CO2: 23 mEq/L (ref 19–32)
Calcium: 8.7 mg/dL (ref 8.4–10.5)
Chloride: 103 mEq/L (ref 96–112)
Creatinine, Ser: 1.34 mg/dL (ref 0.40–1.50)
GFR: 54.81 mL/min — ABNORMAL LOW (ref >60.00)
Glucose, Bld: 138 mg/dL — ABNORMAL HIGH (ref 70–99)
Potassium: 4.3 mEq/L (ref 3.5–5.1)
Sodium: 137 mEq/L (ref 135–145)

## 2020-07-11 LAB — CBC
HCT: 41.4 % (ref 39.0–52.0)
Hemoglobin: 13.8 g/dL (ref 13.0–17.0)
MCHC: 33.3 g/dL (ref 30.0–36.0)
MCV: 94.2 fl (ref 78.0–100.0)
Platelets: 221 10*3/uL (ref 150.0–400.0)
RBC: 4.4 Mil/uL (ref 4.22–5.81)
RDW: 15.3 % (ref 11.5–15.5)
WBC: 16.6 10*3/uL — ABNORMAL HIGH (ref 4.0–10.5)

## 2020-07-11 MED ORDER — TAMSULOSIN HCL 0.4 MG PO CAPS: 0.4000 mg | ORAL_CAPSULE | Freq: Every morning | ORAL | 3 refills | Status: DC

## 2020-07-11 NOTE — Progress Notes (Signed)
[facility] [address] [place] [address] Dept: [phone] Dept Fax: [phone]  Chronic Care Office Visit  Subjective:    Patient ID: Gerald Carpenter, male    DOB: 31-Oct-1950, 70 y.o..   MRN: [id number]  Chief Complaint  Patient presents with  . Follow-up    3 month f/u.  No concerns.      History of Present Illness:  Patient is in today for reassessment of chronic medical issues.  Since his last visit, Gerald Carpenter underwent an aortogram that confirmed a left superficial femoral artery occlusion and a 30-40% stenosis of the right common femoral artery and high-grade stenosis of the proximal posterior tibial artery. He underwent a bypass using the left greater saphenous vein of the left common femoral artery to the above knee popliteal artery. He feels his surgical wounds are healing well. He has been walking tot he end of the block and back without difficulty. Home health will be making a final visit today with a plan to transition him to outpatient PT.  Gerald Carpenter continues to work at smoking cessation. He smokes 4-5 cigarettes a day and notes that he often only takes 1-2 drags off a cigarette before setting it down.  Gerald Carpenter was started on Flomax for LUTS in Feb. He notes that he is urinating much better at this point. He still has some urinary frequency, but no longer feels like he has to strain to initiate his stream.  Past Medical History: Patient Active Problem List   Diagnosis Date Noted  . PAD (peripheral artery disease) ([place]) 06/29/2020  . Prediabetes 04/22/2020  . Chronic kidney disease, stage 3b ([place]) 04/15/2020  . Tobacco dependence due to cigarettes 04/12/2020  . Lower urinary tract symptoms (LUTS) 04/12/2020  . History of colon polyps 04/12/2020  . Atherosclerosis of native arteries of extremity with intermittent claudication ([place]) 03/01/2020  . Pain in both feet 07/31/2019  . Primary osteoarthritis  of right knee 07/07/2019  . Status post total replacement of right hip 05/25/2019  . Lumbar radiculopathy 02/05/2019   Past Surgical History:  Procedure Laterality Date  . ABDOMINAL AORTOGRAM W/LOWER EXTREMITY N/A 06/23/2020   Procedure: ABDOMINAL AORTOGRAM W/LOWER EXTREMITY;  Surgeon: [name], MD;  Location: [place] [facility];  Service: Cardiovascular;  Laterality: N/A;  . ANTERIOR LAT LUMBAR FUSION Right 02/05/2019   Procedure: ATTEMPTED ANTERIOR LATERAL INTERBODY FUSION;  Surgeon: Consuella Lose, MD;  Location: [place];  Service: Neurosurgery;  Laterality: Right;  RIGHT LATERAL TRANSPSOAS DISCECTOMY AND FUSION WITH ROBOTIC ASSISTED PEDICLE SCREW STABILIZATION, LUMBAR 4- LUMBAR 5  . COLONOSCOPY    . ENDOVEIN HARVEST OF GREATER SAPHENOUS VEIN Left 06/29/2020   Procedure: HARVEST OF LEFT GREATER SAPHENOUS VEIN;  Surgeon: [name], MD;  Location: [place];  Service: Vascular;  Laterality: Left;  . EYE SURGERY Bilateral 2013   lens implants for cataracts after Lasik  . FEMORAL-POPLITEAL BYPASS GRAFT Left 06/29/2020   Procedure: BYPASS GRAFT LEFT COMMON FEMORAL TO ABOVE KNEE POPLITEAL ARTERY;  Surgeon: [name], MD;  Location: [place];  Service: Vascular;  Laterality: Left;  . KNEE ARTHROSCOPY Right 1980  . TONSILLECTOMY     at a very young age, but grew back  . TOTAL HIP ARTHROPLASTY Right 05/25/2019   Procedure: RIGHT TOTAL HIP ARTHROPLASTY ANTERIOR APPROACH;  Surgeon: Leandrew Koyanagi, MD;  Location: [place] [facility];  Service: Orthopedics;  Laterality: Right;   Family History  Problem Relation Age of Onset  .  Pulmonary fibrosis Mother   . Heart disease Father    Outpatient Medications Prior to Visit  Medication Sig Dispense Refill  . aspirin EC 81 MG tablet Take 1 tablet (81 mg total) by mouth daily. Swallow whole. 150 tablet 2  . atorvastatin (LIPITOR) 40 MG tablet Take 1 tablet (40 mg total) by mouth daily. 30 tablet 1  . fluticasone (FLONASE) 50 MCG/ACT nasal spray  Place 2 sprays into both nostrils in the morning.    . tamsulosin (FLOMAX) 0.4 MG CAPS capsule Take 1 capsule (0.4 mg total) by mouth daily. (Patient taking differently: Take 0.4 mg by mouth in the morning.) 30 capsule 3  . Cholecalciferol (VITAMIN D3) 250 MCG (10000 UT) TABS Take 10,000 Units by mouth in the morning. (Patient not taking: Reported on 07/11/2020)    . oxyCODONE-acetaminophen (PERCOCET/ROXICET) 5-325 MG tablet Take 1-2 tablets by mouth every 4 (four) hours as needed for moderate pain. (Patient not taking: Reported on 07/11/2020) 30 tablet 0  . vitamin C (ASCORBIC ACID) 500 MG tablet Take 500 mg by mouth in the morning. (Patient not taking: Reported on 07/11/2020)     No facility-administered medications prior to visit.    Allergies  Allergen Reactions  . Anesthetics, Amide Nausea And Vomiting   Objective:   Today's Vitals   07/11/20 0822  BP: 124/66  Pulse: 86  Temp: 98.1 F (36.7 C)  TempSrc: Temporal  SpO2: 98%  Weight: 152 lb 12.8 oz (69.3 kg)  Height: 5\' 8"  (1.727 m)   Body mass index is 23.23 kg/m.   General: Well developed, well nourished. No acute distress. Extremities: Surgical wounds on left thigh are clean without sign of drainage. There is some resolving bruising to the inner   thigh. Cap refill is about 3 secs at left great toe. Psych: Alert and oriented. Normal mood and affect.i  Health Maintenance Due  Topic Date Due  . Hepatitis C Screening  Never done  . COVID-19 Vaccine (3 - Booster for Pfizer series) 10/05/2019   Lab Results CBC Latest Ref Rng & Units 06/30/2020 06/29/2020 06/23/2020  WBC 4.0 - 10.5 K/uL 18.4(H) 15.8(H) -  Hemoglobin 13.0 - 17.0 g/dL 12.5(L) 16.7 15.6  Hematocrit 39.0 - 52.0 % 37.7(L) 50.5 46.0  Platelets 150 - 400 K/uL 153 178 -   BMP Latest Ref Rng & Units 06/30/2020 06/29/2020 06/23/2020  Glucose 70 - 99 mg/dL 103(H) 100(H) 101(H)  BUN 8 - 23 mg/dL 18 21 18   Creatinine 0.61 - 1.24 mg/dL 1.16 1.54(H) 1.50(H)  Sodium 135 - 145  mmol/L 138 138 142  Potassium 3.5 - 5.1 mmol/L 3.4(L) 4.4 4.4  Chloride 98 - 111 mmol/L 114(H) 108 106  CO2 22 - 32 mmol/L 19(L) 21(L) -  Calcium 8.9 - 10.3 mg/dL 6.7(L) 8.9 -     Assessment & Plan:   1. Atherosclerosis of native artery of left lower extremity with intermittent claudication [facility]) Mr. Borgwardt is s/p arterial bypass and recovering well. I reviewed his discharge summary and aortogram results. He is advancing his exercise and working with PT. As he had an elevated WBC on his last lab draw, I will check a repeat CBC today to make sure this is resolving.  - CBC  2. Chronic kidney disease, stage 3b (HCC) Last BMP showed normal renal funciton. Mr. Milich feels he may have been relatively dehydrated on previous blood draws. We will reassess today.  - Basic metabolic panel  3. Lower urinary tract symptoms (LUTS) Improved on Flomax.  -  tamsulosin (FLOMAX) 0.4 MG CAPS capsule; Take 1 capsule (0.4 mg total) by mouth in the morning.  Dispense: 90 capsule; Refill: 3  4. Encounter for hepatitis C screening test for low risk patient  - HCV Ab w Reflex to Quant PCR  5. Encounter for smoking cessation counseling Continue to advise smoking cessation. Patient is working on this on his own.  Haydee Salter, MD

## 2020-07-13 LAB — HCV AB W REFLEX TO QUANT PCR: HCV Ab: <0.1 s/co ratio (ref 0.0–0.9)

## 2020-07-13 LAB — HCV INTERPRETATION

## 2020-07-19 ENCOUNTER — Ambulatory Visit (INDEPENDENT_AMBULATORY_CARE_PROVIDER_SITE_OTHER): Payer: Medicare Other | Admitting: Physician Assistant

## 2020-07-19 ENCOUNTER — Other Ambulatory Visit: Payer: Self-pay

## 2020-07-19 VITALS — BP 110/63 | HR 81 | Temp 98.0°F | Resp 20 | Ht 68.0 in | Wt 147.8 lb

## 2020-07-19 DIAGNOSIS — I739 Peripheral vascular disease, unspecified: Secondary | ICD-10-CM

## 2020-07-19 NOTE — Progress Notes (Signed)
  POST OPERATIVE OFFICE NOTE    CC:  F/u for surgery  HPI:  This is a 70 y.o. male who is s/p left CFA to AK popliteal bypass grafting with ipsilateral nonreversed GSV on 06/29/2020 by Dr. Carlis Abbott for short distance lifestyle limiting claudication.    Pt returns today for follow up.  Pt states he is doing well overall.  He does have some numbness and pain around the left knee but his pre op symptoms of short distance claudication has resolved.  He continues to smoke.  He says he has gone from 3 ppd to 5-6 cigarettes per day.  Inquires about patches.  Allergies  Allergen Reactions  . Anesthetics, Amide Nausea And Vomiting    Current Outpatient Medications  Medication Sig Dispense Refill  . aspirin EC 81 MG tablet Take 1 tablet (81 mg total) by mouth daily. Swallow whole. 150 tablet 2  . atorvastatin (LIPITOR) 40 MG tablet Take 1 tablet (40 mg total) by mouth daily. 30 tablet 1  . fluticasone (FLONASE) 50 MCG/ACT nasal spray Place 2 sprays into both nostrils in the morning.    . tamsulosin (FLOMAX) 0.4 MG CAPS capsule Take 1 capsule (0.4 mg total) by mouth in the morning. 90 capsule 3   No current facility-administered medications for this visit.     ROS:  See HPI  Physical Exam:  Today's Vitals   07/19/20 0930  BP: 110/63  Pulse: 81  Resp: 20  Temp: 98 F (36.7 C)  TempSrc: Temporal  SpO2: 99%  Weight: 147 lb 12.8 oz (67 kg)  Height: 5\' 8"  (1.727 m)  PainSc: 2   PainLoc: Leg   Body mass index is 22.47 kg/m.   Incision:  All have healed nicely Extremities:  Brisk biphasic left DP and brisk monophasic PT/peroneal.      Assessment/Plan:  This is a 70 y.o. male who is s/p:  left CFA to AK popliteal bypass grafting with ipsilateral nonreversed GSV on 06/29/2020 by Dr. Carlis Abbott for short distance lifestyle limiting claudication.  -pt doing well with brisk doppler signals left foot.  -his incisions have healed nicely. Numbness around knee most likely due to nerve irritation  and hopeful this will improve over time. -discussed smoking cessation and ok for patches. -pt will f/u in 3 months with ABI and LLE arterial duplex.  He will call sooner if he has any issues.  -continue asa/statin  Leontine Locket, [facility] [phone]   Clinic MD:  Pt seen with Dr. Carlis Abbott

## 2020-07-22 ENCOUNTER — Other Ambulatory Visit: Payer: Self-pay

## 2020-07-22 DIAGNOSIS — I739 Peripheral vascular disease, unspecified: Secondary | ICD-10-CM

## 2020-07-28 ENCOUNTER — Other Ambulatory Visit: Payer: Self-pay | Admitting: Family Medicine

## 2020-07-28 DIAGNOSIS — R399 Unspecified symptoms and signs involving the genitourinary system: Secondary | ICD-10-CM

## 2020-07-29 ENCOUNTER — Encounter: Payer: Self-pay | Admitting: Physical Medicine and Rehabilitation

## 2020-08-09 ENCOUNTER — Ambulatory Visit (INDEPENDENT_AMBULATORY_CARE_PROVIDER_SITE_OTHER): Payer: Medicare Other | Admitting: *Deleted

## 2020-08-09 DIAGNOSIS — Z0000 Encounter for general adult medical examination without abnormal findings: Secondary | ICD-10-CM

## 2020-08-09 NOTE — Progress Notes (Signed)
Subjective:   Lula Michaux is a 70 y.o. male who presents for Medicare Annual/Subsequent preventive examination.  I connected with  [name] on 08/09/20 by a telephone enabled telemedicine application and verified that I am speaking with the correct person using two identifiers.   I discussed the limitations of evaluation and management by telemedicine. The patient expressed understanding and agreed to proceed.  Patient location: home  Provider location: Tele-health visit    Review of Systems     NA Cardiac Risk Factors include: advanced age (>66men, >77 women);dyslipidemia;male gender;family history of premature cardiovascular disease     Objective:    Today's Vitals   08/09/20 0813  PainSc: 0-No pain   There is no height or weight on file to calculate BMI.  Advanced Directives 08/09/2020 06/29/2020 06/23/2020 05/25/2019 05/21/2019 03/09/2019 02/10/2019  Does Patient Have a Medical Advance Directive? No No No No No No No  Would patient like information on creating a medical advance directive? No - Patient declined No - Patient declined No - Patient declined No - Patient declined No - Patient declined No - Patient declined -    Current Medications (verified) Outpatient Encounter Medications as of 08/09/2020  Medication Sig   aspirin EC 81 MG tablet Take 1 tablet (81 mg total) by mouth daily. Swallow whole.   atorvastatin (LIPITOR) 40 MG tablet Take 1 tablet (40 mg total) by mouth daily.   fluticasone (FLONASE) 50 MCG/ACT nasal spray Place 2 sprays into both nostrils in the morning.   tamsulosin (FLOMAX) 0.4 MG CAPS capsule Take 1 capsule (0.4 mg total) by mouth in the morning.   No facility-administered encounter medications on file as of 08/09/2020.    Allergies (verified) Anesthetics, amide   History: Past Medical History:  Diagnosis Date   Arthritis    Complication of anesthesia    Family history of adverse reaction to anesthesia    daughter- N/V   Nephritis     when pt. was 7 yrs. ols   Peripheral vascular disease (HCC)    PONV (postoperative nausea and vomiting)    "Only when I have aortagram on 06/23/20".   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   Past Surgical History:  Procedure Laterality Date   ABDOMINAL AORTOGRAM W/LOWER EXTREMITY N/A 06/23/2020   Procedure: ABDOMINAL AORTOGRAM W/LOWER EXTREMITY;  Surgeon: [name], MD;  Location: [facility];  Service: Cardiovascular;  Laterality: N/A;   ANTERIOR LAT LUMBAR FUSION Right 02/05/2019   Procedure: ATTEMPTED ANTERIOR LATERAL INTERBODY FUSION;  Surgeon: Consuella Lose, MD;  Location: [facility];  Service: Neurosurgery;  Laterality: Right;  RIGHT LATERAL TRANSPSOAS DISCECTOMY AND FUSION WITH ROBOTIC ASSISTED PEDICLE SCREW STABILIZATION, LUMBAR 4- LUMBAR 5   COLONOSCOPY     ENDOVEIN HARVEST OF GREATER SAPHENOUS VEIN Left 06/29/2020   Procedure: HARVEST OF LEFT GREATER SAPHENOUS VEIN;  Surgeon: [name], MD;  Location: [facility];  Service: Vascular;  Laterality: Left;   EYE SURGERY Bilateral 2013   lens implants for cataracts after Lasik   FEMORAL-POPLITEAL BYPASS GRAFT Left 06/29/2020   Procedure: BYPASS GRAFT LEFT COMMON FEMORAL TO ABOVE KNEE POPLITEAL ARTERY;  Surgeon: [name], MD;  Location: [facility];  Service: Vascular;  Laterality: Left;   KNEE ARTHROSCOPY Right 1980   TONSILLECTOMY     at a very young age, but grew back   TOTAL HIP ARTHROPLASTY Right 05/25/2019   Procedure: RIGHT TOTAL HIP ARTHROPLASTY ANTERIOR APPROACH;  Surgeon: Leandrew Koyanagi, MD;  Location: [facility]  OR;  Service: Orthopedics;  Laterality: Right;   Family History  Problem Relation Age of Onset   Pulmonary fibrosis Mother    Heart disease Father    Social History   Socioeconomic History   Marital status: Married    Spouse name: Not on file   Number of children: Not on file   Years of education: Not on file   Highest education level: Not on file  Occupational History   Not on file   Tobacco Use   Smoking status: Some Days    Packs/day: 0.25    Years: 53.00    Pack years: 13.25    Types: Cigarettes   Smokeless tobacco: Never   Tobacco comments:    had first cigarettes age  75, habit started at age 76  Vaping Use   Vaping Use: Never used  Substance and Sexual Activity   Alcohol use: Not Currently    Comment: 05/10/1991   Drug use: Not Currently    Comment: prior usage of "speed"  when drinking   Sexual activity: Yes  Other Topics Concern   Not on file  Social History Narrative   Recently moved here from [facility], [place]   Social Determinants of Health   Financial Resource Strain: Low Risk    Difficulty of Paying Living Expenses: Not hard at all  Food Insecurity: No Food Insecurity   Worried About Charity fundraiser in the Last Year: Never true   Arboriculturist in the Last Year: Never true  Transportation Needs: No Transportation Needs   Lack of Transportation (Medical): No   Lack of Transportation (Non-Medical): No  Physical Activity: Inactive   Days of Exercise per Week: 0 days   Minutes of Exercise per Session: 0 min  Stress: No Stress Concern Present   Feeling of Stress : Not at all  Social Connections: Moderately Isolated   Frequency of Communication with Friends and Family: More than three times a week   Frequency of Social Gatherings with Friends and Family: Once a week   Attends Religious Services: Never   Marine scientist or Organizations: No   Attends Music therapist: Never   Marital Status: Married    Tobacco Counseling Ready to quit: Not Answered Counseling given: Not Answered Tobacco comments: had first cigarettes age  15, habit started at age 20   Clinical Intake:  Pre-visit preparation completed: Yes  Pain : No/denies pain Pain Score: 0-No pain     Nutritional Risks: None Diabetes: No  How often do you need to have someone help you when you read instructions, pamphlets, or other written materials  from your doctor or pharmacy?: 1 - Never  Diabetic?  No  Interpreter Needed?: No  Information entered by :: Leroy Kennedy LPN   Activities of Daily Living In your present state of health, do you have any difficulty performing the following activities: 08/09/2020 06/29/2020  Hearing? N -  Vision? N -  Difficulty concentrating or making decisions? - -  Walking or climbing stairs? N -  Dressing or bathing? N -  Doing errands, shopping? N N  Preparing Food and eating ? N -  Using the Toilet? N -  In the past six months, have you accidently leaked urine? N -  Do you have problems with loss of bowel control? N -  Managing your Medications? N -  Managing your Finances? N -  Housekeeping or managing your Housekeeping? N -  Some recent data  might be hidden    Patient Care Team: Haydee Salter, MD as PCP - General (Family Medicine) [name], MD as Consulting Physician (Vascular Surgery) Newt Minion, MD as Consulting Physician (Orthopedic Surgery) Leandrew Koyanagi, MD as Attending Physician (Orthopedic Surgery) Consuella Lose, MD as Consulting Physician (Neurosurgery)  Indicate any recent Medical Services you may have received from other than [facility] providers in the past year (date may be approximate).     Assessment:   This is a routine wellness examination for Daronte.  Hearing/Vision screen Hearing Screening - Comments:: No trouble hearing Vision Screening - Comments:: 8 years ago cataract surgery Lens inserts No issue   Dietary issues and exercise activities discussed: Current Exercise Habits: The patient does not participate in regular exercise at present, Exercise limited by: cardiac condition(s)   Goals Addressed             This Visit's Progress    Patient Stated       Would like to get back to part-time work        Depression Screen PHQ 2/9 Scores 08/09/2020 04/12/2020  PHQ - 2 Score 0 0    Fall Risk Fall Risk  08/09/2020 04/12/2020  Falls in  the past year? 0 0  Number falls in past yr: 0 -  Injury with Fall? 0 -  Follow up Falls evaluation completed;Falls prevention discussed -    FALL RISK PREVENTION PERTAINING TO THE HOME:  Any stairs in or around the home? Yes  If so, are there any without handrails? Yes  Home free of loose throw rugs in walkways, pet beds, electrical cords, etc? Yes  Adequate lighting in your home to reduce risk of falls? Yes   ASSISTIVE DEVICES UTILIZED TO PREVENT FALLS:  Life alert? No  Use of a cane, walker or w/c? No  Grab bars in the bathroom? No  Shower chair or bench in shower? No  Elevated toilet seat or a handicapped toilet? No   TIMED UP AND GO:  Was the test performed? No .   Tele-Health visit    Cognitive Function:  Normal cognitive status assessed by direct observation by this Nurse Health Advisor. No abnormalities found.          Immunizations Immunization History  Administered Date(s) Administered   PFIZER(Purple Top)SARS-COV-2 Vaccination 04/11/2019, 05/05/2019   Pneumococcal Conjugate-13 04/12/2020   Tdap 11/10/2017    TDAP status: Up to date  Flu Vaccine status: Due, Education has been provided regarding the importance of this vaccine. Advised may receive this vaccine at local pharmacy or Health Dept. Aware to provide a copy of the vaccination record if obtained from local pharmacy or Health Dept. Verbalized acceptance and understanding.  Pneumococcal vaccine status: Up to date  Covid-19 vaccine status: Information provided on how to obtain vaccines.   Qualifies for Shingles Vaccine? No   Zostavax completed  patient states had in [place]    Shingrix Completed?: No.    Education has been provided regarding the importance of this vaccine. Patient has been advised to call insurance company to determine out of pocket expense if they have not yet received this vaccine. Advised may also receive vaccine at local pharmacy or Health Dept. Verbalized acceptance and  understanding.  Screening Tests Health Maintenance  Topic Date Due   Zoster Vaccines- Shingrix (1 of 2) Never done   COVID-19 Vaccine (3 - Booster for Pfizer series) 10/05/2019   PNA vac Low Risk Adult (2 of 2 - PPSV23)  04/12/2021   COLONOSCOPY (Pts 45-78yrs Insurance coverage will need to be confirmed)  11/29/2022   TETANUS/TDAP  11/11/2027   Hepatitis C Screening  Completed   HPV VACCINES  Aged Out   INFLUENZA VACCINE  Discontinued    Health Maintenance  Health Maintenance Due  Topic Date Due   Zoster Vaccines- Shingrix (1 of 2) Never done   COVID-19 Vaccine (3 - Booster for Pfizer series) 10/05/2019    Colorectal cancer screening: Type of screening: Colonoscopy. Completed 2018 per patient. Repeat every 5 years  Lung Cancer Screening: (Low Dose CT Chest recommended if Age 36-80 years, 30 pack-year currently smoking OR have quit w/in 15years.) does not qualify.   Lung Cancer Screening Referral: na  Additional Screening:  Hepatitis C Screening: does qualify  Vision Screening: Recommended annual ophthalmology exams for early detection of glaucoma and other disorders of the eye. Is the patient up to date with their annual eye exam?  No  Who is the provider or what is the name of the office in which the patient attends annual eye exams? Declined If pt is not established with a provider, would they like to be referred to a provider to establish care?  Declined .   Dental Screening: Recommended annual dental exams for proper oral hygiene  Community Resource Referral / Chronic Care Management: CRR required this visit?  No   CCM required this visit?  No      Plan:     I have personally reviewed and noted the following in the patient's chart:   Medical and social history Use of alcohol, tobacco or illicit drugs  Current medications and supplements including opioid prescriptions. Patient is not currently taking opioid prescriptions. Functional ability and  status Nutritional status Physical activity Advanced directives List of other physicians Hospitalizations, surgeries, and ER visits in previous 12 months Vitals Screenings to include cognitive, depression, and falls Referrals and appointments  In addition, I have reviewed and discussed with patient certain preventive protocols, quality metrics, and best practice recommendations. A written personalized care plan for preventive services as well as general preventive health recommendations were provided to patient.     Leroy Kennedy, LPN   6/31/4970   Nurse Notes:  NA

## 2020-08-09 NOTE — Patient Instructions (Signed)
Mr. Gerald Carpenter , Thank you for taking time to come for your Medicare Wellness Visit. I appreciate your ongoing commitment to your health goals. Please review the following plan we discussed and let me know if I can assist you in the future.   Screening recommendations/referrals: Colonoscopy: per patient up to date due in 2 years Recommended yearly ophthalmology/optometry visit for glaucoma screening and checkup Recommended yearly dental visit for hygiene and checkup  Vaccinations: Influenza vaccine: Education provided Pneumococcal vaccine: up to date Tdap vaccine: up to date Shingles vaccine: per patient up to date    Advanced directives: education provided  Conditions/risks identified: na  Next appointment: 10-11-2020 @ 8:00 am Dr. Gena Fray  Preventive Care 68 Years and Older, Male Preventive care refers to lifestyle choices and visits with your health care provider that can promote health and wellness. What does preventive care include? A yearly physical exam. This is also called an annual well check. Dental exams once or twice a year. Routine eye exams. Ask your health care provider how often you should have your eyes checked. Personal lifestyle choices, including: Daily care of your teeth and gums. Regular physical activity. Eating a healthy diet. Avoiding tobacco and drug use. Limiting alcohol use. Practicing safe sex. Taking low doses of aspirin every day. Taking vitamin and mineral supplements as recommended by your health care provider. What happens during an annual well check? The services and screenings done by your health care provider during your annual well check will depend on your age, overall health, lifestyle risk factors, and family history of disease. Counseling  Your health care provider may ask you questions about your: Alcohol use. Tobacco use. Drug use. Emotional well-being. Home and relationship well-being. Sexual activity. Eating habits. History of  falls. Memory and ability to understand (cognition). Work and work Statistician. Screening  You may have the following tests or measurements: Height, weight, and BMI. Blood pressure. Lipid and cholesterol levels. These may be checked every 5 years, or more frequently if you are over 68 years old. Skin check. Lung cancer screening. You may have this screening every year starting at age 68 if you have a 30-pack-year history of smoking and currently smoke or have quit within the past 15 years. Fecal occult blood test (FOBT) of the stool. You may have this test every year starting at age 68. Flexible sigmoidoscopy or colonoscopy. You may have a sigmoidoscopy every 5 years or a colonoscopy every 10 years starting at age 68. Prostate cancer screening. Recommendations will vary depending on your family history and other risks. Hepatitis C blood test. Hepatitis B blood test. Sexually transmitted disease (STD) testing. Diabetes screening. This is done by checking your blood sugar (glucose) after you have not eaten for a while (fasting). You may have this done every 1-3 years. Abdominal aortic aneurysm (AAA) screening. You may need this if you are a current or former smoker. Osteoporosis. You may be screened starting at age 68 if you are at high risk. Talk with your health care provider about your test results, treatment options, and if necessary, the need for more tests. Vaccines  Your health care provider may recommend certain vaccines, such as: Influenza vaccine. This is recommended every year. Tetanus, diphtheria, and acellular pertussis (Tdap, Td) vaccine. You may need a Td booster every 10 years. Zoster vaccine. You may need this after age 68. Pneumococcal 13-valent conjugate (PCV13) vaccine. One dose is recommended after age 68. Pneumococcal polysaccharide (PPSV23) vaccine. One dose is recommended after age 68. Talk  to your health care provider about which screenings and vaccines you need and  how often you need them. This information is not intended to replace advice given to you by your health care provider. Make sure you discuss any questions you have with your health care provider. Document Released: 03/04/2015 Document Revised: 10/26/2015 Document Reviewed: 12/07/2014 Elsevier Interactive Patient Education  2017 [employer] Prevention in the Home Falls can cause injuries. They can happen to people of all ages. There are many things you can do to make your home safe and to help prevent falls. What can I do on the outside of my home? Regularly fix the edges of walkways and driveways and fix any cracks. Remove anything that might make you trip as you walk through a door, such as a raised step or threshold. Trim any bushes or trees on the path to your home. Use bright outdoor lighting. Clear any walking paths of anything that might make someone trip, such as rocks or tools. Regularly check to see if handrails are loose or broken. Make sure that both sides of any steps have handrails. Any raised decks and porches should have guardrails on the edges. Have any leaves, snow, or ice cleared regularly. Use sand or salt on walking paths during winter. Clean up any spills in your garage right away. This includes oil or grease spills. What can I do in the bathroom? Use night lights. Install grab bars by the toilet and in the tub and shower. Do not use towel bars as grab bars. Use non-skid mats or decals in the tub or shower. If you need to sit down in the shower, use a plastic, non-slip stool. Keep the floor dry. Clean up any water that spills on the floor as soon as it happens. Remove soap buildup in the tub or shower regularly. Attach bath mats securely with double-sided non-slip rug tape. Do not have throw rugs and other things on the floor that can make you trip. What can I do in the bedroom? Use night lights. Make sure that you have a light by your bed that is easy to  reach. Do not use any sheets or blankets that are too big for your bed. They should not hang down onto the floor. Have a firm chair that has side arms. You can use this for support while you get dressed. Do not have throw rugs and other things on the floor that can make you trip. What can I do in the kitchen? Clean up any spills right away. Avoid walking on wet floors. Keep items that you use a lot in easy-to-reach places. If you need to reach something above you, use a strong step stool that has a grab bar. Keep electrical cords out of the way. Do not use floor polish or wax that makes floors slippery. If you must use wax, use non-skid floor wax. Do not have throw rugs and other things on the floor that can make you trip. What can I do with my stairs? Do not leave any items on the stairs. Make sure that there are handrails on both sides of the stairs and use them. Fix handrails that are broken or loose. Make sure that handrails are as long as the stairways. Check any carpeting to make sure that it is firmly attached to the stairs. Fix any carpet that is loose or worn. Avoid having throw rugs at the top or bottom of the stairs. If you do have throw rugs,  attach them to the floor with carpet tape. Make sure that you have a light switch at the top of the stairs and the bottom of the stairs. If you do not have them, ask someone to add them for you. What else can I do to help prevent falls? Wear shoes that: Do not have high heels. Have rubber bottoms. Are comfortable and fit you well. Are closed at the toe. Do not wear sandals. If you use a stepladder: Make sure that it is fully opened. Do not climb a closed stepladder. Make sure that both sides of the stepladder are locked into place. Ask someone to hold it for you, if possible. Clearly mark and make sure that you can see: Any grab bars or handrails. First and last steps. Where the edge of each step is. Use tools that help you move  around (mobility aids) if they are needed. These include: Canes. Walkers. Scooters. Crutches. Turn on the lights when you go into a dark area. Replace any light bulbs as soon as they burn out. Set up your furniture so you have a clear path. Avoid moving your furniture around. If any of your floors are uneven, fix them. If there are any pets around you, be aware of where they are. Review your medicines with your doctor. Some medicines can make you feel dizzy. This can increase your chance of falling. Ask your doctor what other things that you can do to help prevent falls. This information is not intended to replace advice given to you by your health care provider. Make sure you discuss any questions you have with your health care provider. Document Released: 12/02/2008 Document Revised: 07/14/2015 Document Reviewed: 03/12/2014 Elsevier Interactive Patient Education  2017 [employer].

## 2020-08-17 ENCOUNTER — Telehealth: Payer: Self-pay

## 2020-08-17 NOTE — Telephone Encounter (Signed)
Patient calls today to ask about restrictions s/p FPBG on 5/11. He is doing well and would like to light exercise and get back to his routines. Discussed with PA, advised this was fine - patient verbalizes understanding.

## 2020-09-18 ENCOUNTER — Emergency Department (HOSPITAL_COMMUNITY)
Admission: EM | Admit: 2020-09-18 | Discharge: 2020-09-18 | Disposition: A | Discharge status: Home / Self Care | Payer: Medicare Other | Attending: Emergency Medicine | Admitting: Emergency Medicine

## 2020-09-18 ENCOUNTER — Emergency Department (HOSPITAL_COMMUNITY): Payer: Medicare Other

## 2020-09-18 ENCOUNTER — Other Ambulatory Visit: Payer: Self-pay

## 2020-09-18 ENCOUNTER — Emergency Department (HOSPITAL_BASED_OUTPATIENT_CLINIC_OR_DEPARTMENT_OTHER): Payer: Medicare Other

## 2020-09-18 ENCOUNTER — Encounter (HOSPITAL_COMMUNITY): Payer: Self-pay | Admitting: Emergency Medicine

## 2020-09-18 DIAGNOSIS — Z7982 Long term (current) use of aspirin: Secondary | ICD-10-CM | POA: Insufficient documentation

## 2020-09-18 DIAGNOSIS — N1832 Chronic kidney disease, stage 3b: Secondary | ICD-10-CM | POA: Insufficient documentation

## 2020-09-18 DIAGNOSIS — Z96641 Presence of right artificial hip joint: Secondary | ICD-10-CM | POA: Insufficient documentation

## 2020-09-18 DIAGNOSIS — M7989 Other specified soft tissue disorders: Secondary | ICD-10-CM | POA: Diagnosis present

## 2020-09-18 DIAGNOSIS — F17210 Nicotine dependence, cigarettes, uncomplicated: Secondary | ICD-10-CM | POA: Insufficient documentation

## 2020-09-18 LAB — CBC WITH DIFFERENTIAL/PLATELET
Abs Immature Granulocytes: 0.05 10*3/uL (ref 0.00–0.07)
Basophils Absolute: 0.2 10*3/uL — ABNORMAL HIGH (ref 0.0–0.1)
Basophils Relative: 2 %
Eosinophils Absolute: 0.9 10*3/uL — ABNORMAL HIGH (ref 0.0–0.5)
Eosinophils Relative: 7 %
HCT: 46.5 % (ref 39.0–52.0)
Hemoglobin: 15.7 g/dL (ref 13.0–17.0)
Immature Granulocytes: 0 %
Lymphocytes Relative: 21 %
Lymphs Abs: 2.9 10*3/uL (ref 0.7–4.0)
MCH: 31.6 pg (ref 26.0–34.0)
MCHC: 33.8 g/dL (ref 30.0–36.0)
MCV: 93.6 fL (ref 80.0–100.0)
Monocytes Absolute: 1.6 10*3/uL — ABNORMAL HIGH (ref 0.1–1.0)
Monocytes Relative: 12 %
Neutro Abs: 8.1 10*3/uL — ABNORMAL HIGH (ref 1.7–7.7)
Neutrophils Relative %: 58 %
Platelets: 215 10*3/uL (ref 150–400)
RBC: 4.97 MIL/uL (ref 4.22–5.81)
RDW: 15.9 % — ABNORMAL HIGH (ref 11.5–15.5)
WBC: 13.8 10*3/uL — ABNORMAL HIGH (ref 4.0–10.5)
nRBC: 0 % (ref 0.0–0.2)

## 2020-09-18 LAB — COMPREHENSIVE METABOLIC PANEL
ALT: 19 U/L (ref 0–44)
AST: 19 U/L (ref 15–41)
Albumin: 3.5 g/dL (ref 3.5–5.0)
Alkaline Phosphatase: 51 U/L (ref 38–126)
Anion gap: 8 (ref 5–15)
BUN: 19 mg/dL (ref 8–23)
CO2: 23 mmol/L (ref 22–32)
Calcium: 8.7 mg/dL — ABNORMAL LOW (ref 8.9–10.3)
Chloride: 107 mmol/L (ref 98–111)
Creatinine, Ser: 1.65 mg/dL — ABNORMAL HIGH (ref 0.61–1.24)
GFR, Estimated: 45 mL/min — ABNORMAL LOW (ref >60)
Glucose, Bld: 106 mg/dL — ABNORMAL HIGH (ref 70–99)
Potassium: 4.1 mmol/L (ref 3.5–5.1)
Sodium: 138 mmol/L (ref 135–145)
Total Bilirubin: 0.2 mg/dL — ABNORMAL LOW (ref 0.3–1.2)
Total Protein: 6.6 g/dL (ref 6.5–8.1)

## 2020-09-18 NOTE — ED Provider Notes (Signed)
Emergency Medicine Provider Triage Evaluation Note  Gerald Carpenter , a 70 y.o. male  was evaluated in triage.  Pt complains of pain and swelling to LLE. Hx of bypass 3 months ago. Warmth to LLE. No fever, chills, emesis. Chronic numbness to LLE since surgery. No pain to posterior calf. No CP, SOB, recently came back from beach  Review of Systems  Positive: Leg swelling, redness Negative: Fever, emesis  Physical Exam  There were no vitals taken for this visit. Gen:   Awake, no distress   Resp:  Normal effort  MSK:   Moves extremities without difficulty, swelling, redness, warmth to LLE Other:    Medical Decision Making  Medically screening exam initiated at 2:52 PM.  Appropriate orders placed.  Gerald Carpenter was informed that the remainder of the evaluation will be completed by another provider, this initial triage assessment does not replace that evaluation, and the importance of remaining in the ED until their evaluation is complete.  LLE leg swelling, redness   Gerald Carpenter A, PA-C 09/18/20 1456    Wyvonnia Dusky, MD 09/19/20 [phone]

## 2020-09-18 NOTE — ED Provider Notes (Signed)
[facility] EMERGENCY DEPARTMENT Provider Note   CSN: [id number] Arrival date & time: 09/18/20  1446     History No chief complaint on file.   Gerald Carpenter is a 70 y.o. male past ministry of arthritis, peripheral vascular disease who presents for evaluation of swelling noted to his left ankle that began yesterday.  He has a history of femoropopliteal bypass in the left lower extremity in May.  He states that if he was ever to have any swelling of the leg, he was to Get it evaluated.  He states yesterday he did walk a lot and states that he noticed last night, it was swollen.  He has not had any overlying warmth, erythema.  He states occasionally he gets some pain in the back part of his heel.  He has numbness to the left lower extremity which he states is baseline since he had surgery.  No trauma, injury, fall.  He denies any fevers, chest pain, difficulty breathing.   The history is provided by the patient.      Past Medical History:  Diagnosis Date   Arthritis    Complication of anesthesia    Family history of adverse reaction to anesthesia    daughter- N/V   Nephritis    when pt. was 7 yrs. ols   Peripheral vascular disease (HCC)    PONV (postoperative nausea and vomiting)    "Only when I have aortagram on 06/23/20".   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    Patient Active Problem List   Diagnosis Date Noted   PAD (peripheral artery disease) ([place]) 06/29/2020   Prediabetes 04/22/2020   Chronic kidney disease, stage 3b ([place]) 04/15/2020   Tobacco dependence due to cigarettes 04/12/2020   Lower urinary tract symptoms (LUTS) 04/12/2020   History of colon polyps 04/12/2020   Atherosclerosis of native arteries of extremity with intermittent claudication ([place]) 03/01/2020   Pain in both feet 07/31/2019   Primary osteoarthritis of right knee 07/07/2019   Status post total replacement of right hip 05/25/2019   Lumbar radiculopathy 02/05/2019    Past  Surgical History:  Procedure Laterality Date   ABDOMINAL AORTOGRAM W/LOWER EXTREMITY N/A 06/23/2020   Procedure: ABDOMINAL AORTOGRAM W/LOWER EXTREMITY;  Surgeon: [name], MD;  Location: [facility];  Service: Cardiovascular;  Laterality: N/A;   ANTERIOR LAT LUMBAR FUSION Right 02/05/2019   Procedure: ATTEMPTED ANTERIOR LATERAL INTERBODY FUSION;  Surgeon: Consuella Lose, MD;  Location: [facility];  Service: Neurosurgery;  Laterality: Right;  RIGHT LATERAL TRANSPSOAS DISCECTOMY AND FUSION WITH ROBOTIC ASSISTED PEDICLE SCREW STABILIZATION, LUMBAR 4- LUMBAR 5   COLONOSCOPY     ENDOVEIN HARVEST OF GREATER SAPHENOUS VEIN Left 06/29/2020   Procedure: HARVEST OF LEFT GREATER SAPHENOUS VEIN;  Surgeon: [name], MD;  Location: [facility];  Service: Vascular;  Laterality: Left;   EYE SURGERY Bilateral 2013   lens implants for cataracts after Lasik   FEMORAL-POPLITEAL BYPASS GRAFT Left 06/29/2020   Procedure: BYPASS GRAFT LEFT COMMON FEMORAL TO ABOVE KNEE POPLITEAL ARTERY;  Surgeon: [name], MD;  Location: [facility];  Service: Vascular;  Laterality: Left;   KNEE ARTHROSCOPY Right 1980   TONSILLECTOMY     at a very young age, but grew back   TOTAL HIP ARTHROPLASTY Right 05/25/2019   Procedure: RIGHT TOTAL HIP ARTHROPLASTY ANTERIOR APPROACH;  Surgeon: Leandrew Koyanagi, MD;  Location: [facility];  Service: Orthopedics;  Laterality: Right;       Family History  Problem Relation Age of Onset   Pulmonary fibrosis Mother    Heart disease Father     Social History   Tobacco Use   Smoking status: Some Days    Packs/day: 0.25    Years: 53.00    Pack years: 13.25    Types: Cigarettes   Smokeless tobacco: Never   Tobacco comments:    had first cigarettes age  55, habit started at age 67  Vaping Use   Vaping Use: Never used  Substance Use Topics   Alcohol use: Not Currently    Comment: 05/10/1991   Drug use: Not Currently    Comment: prior usage of "speed"  when drinking     Home Medications Prior to Admission medications   Medication Sig Start Date End Date Taking? Authorizing Provider  aspirin EC 81 MG tablet Take 1 tablet (81 mg total) by mouth daily. Swallow whole. 06/23/20 06/23/21  [name], MD  atorvastatin (LIPITOR) 40 MG tablet Take 1 tablet (40 mg total) by mouth daily. 07/02/20   Ulyses Amor, PA-C  fluticasone (FLONASE) 50 MCG/ACT nasal spray Place 2 sprays into both nostrils in the morning. 06/02/20   [provider]  tamsulosin (FLOMAX) 0.4 MG CAPS capsule Take 1 capsule (0.4 mg total) by mouth in the morning. 07/11/20   Haydee Salter, MD    Allergies    Anesthetics, amide  Review of Systems   Review of Systems  Constitutional:  Negative for fever.  Respiratory:  Negative for cough and shortness of breath.   Cardiovascular:  Positive for leg swelling. Negative for chest pain.  Gastrointestinal:  Negative for abdominal pain, nausea and vomiting.  Genitourinary:  Negative for dysuria and hematuria.  Skin:  Negative for color change.  Neurological:  Positive for numbness (chronic). Negative for weakness and headaches.  All other systems reviewed and are negative.  Physical Exam Updated Vital Signs BP 127/89 (BP Location: Right Arm)   Pulse 98   Temp 98.9 F (37.2 C) (Oral)   Resp 20   SpO2 99%   Physical Exam Vitals and nursing note reviewed.  Constitutional:      Appearance: Normal appearance. He is well-developed.  HENT:     Head: Normocephalic and atraumatic.  Eyes:     General: Lids are normal.     Conjunctiva/sclera: Conjunctivae normal.     Pupils: Pupils are equal, round, and reactive to light.  Cardiovascular:     Rate and Rhythm: Normal rate and regular rhythm.     Pulses:          Dorsalis pedis pulses are 2+ on the right side and detected w/ Doppler on the left side.     Heart sounds: Normal heart sounds. No murmur heard.   No friction rub. No gallop.     Comments: Good Doppler pulse of left  lower extremity. Pulmonary:     Effort: Pulmonary effort is normal.     Breath sounds: Normal breath sounds.  Abdominal:     Palpations: Abdomen is soft. Abdomen is not rigid.     Tenderness: There is no abdominal tenderness. There is no guarding.  Musculoskeletal:        General: Normal range of motion.     Cervical back: Full passive range of motion without pain.     Comments: Mild swelling around the left foot/ankle.  No overlying warmth.  Mild tenderness noted to left calf.  No overlying warmth, erythema.  Skin:    General:  Skin is warm and dry.     Capillary Refill: Capillary refill takes less than 2 seconds.     Comments: Good distal cap refill. LLE is not dusky in appearance or cool to touch.  Neurological:     Mental Status: He is alert and oriented to person, place, and time.  Psychiatric:        Speech: Speech normal.     ED Results / Procedures / Treatments   Labs (all labs ordered are listed, but only abnormal results are displayed) Labs Reviewed  CBC WITH DIFFERENTIAL/PLATELET - Abnormal; Notable for the following components:      Result Value   WBC 13.8 (*)    RDW 15.9 (*)    Neutro Abs 8.1 (*)    Monocytes Absolute 1.6 (*)    Eosinophils Absolute 0.9 (*)    Basophils Absolute 0.2 (*)    All other components within normal limits  COMPREHENSIVE METABOLIC PANEL - Abnormal; Notable for the following components:   Glucose, Bld 106 (*)    Creatinine, Ser 1.65 (*)    Calcium 8.7 (*)    Total Bilirubin 0.2 (*)    GFR, Estimated 45 (*)    All other components within normal limits    EKG None  Radiology DG Foot Complete Left  Result Date: 09/18/2020 CLINICAL DATA:  Pain, swelling EXAM: LEFT FOOT - COMPLETE 3+ VIEW COMPARISON:  None. FINDINGS: Plantar calcaneal spur. No acute bony abnormality. Specifically, no fracture, subluxation, or dislocation. IMPRESSION: No acute bony abnormality. Electronically Signed   By: Rolm Baptise M.D.   On: 09/18/2020 15:32   VAS  [place] LOWER EXTREMITY VENOUS (DVT) (ONLY [facility] & [facility])  Result Date: 09/18/2020  Lower Venous DVT Study Patient Name:  VICTORIA FIORITO  Date of Exam:   09/18/2020 Medical Rec #: [id number]       Accession #:    [id number] Date of Birth: [birth year]      Patient Gender: M Patient Age:   69Y Exam Location:  [facility] Procedure:      VAS [place] LOWER EXTREMITY VENOUS (DVT) Referring Phys: [id number] BRITNI A HENDERLY --------------------------------------------------------------------------------  Indications: Swelling.  Risk Factors: Status post left common femoral to above knee popliteal artery bypass graft with saphenous vein 06/29/20. Comparison Study: No prior study Performing Technologist: Sharion Dove RVS  Examination Guidelines: A complete evaluation includes B-mode imaging, spectral Doppler, color Doppler, and power Doppler as needed of all accessible portions of each vessel. Bilateral testing is considered an integral part of a complete examination. Limited examinations for reoccurring indications may be performed as noted. The reflux portion of the exam is performed with the patient in reverse Trendelenburg.  +-----+---------------+---------+-----------+----------+--------------+ RIGHTCompressibilityPhasicitySpontaneityPropertiesThrombus Aging +-----+---------------+---------+-----------+----------+--------------+ CFV  Full           Yes      Yes                                 +-----+---------------+---------+-----------+----------+--------------+   +---------+---------------+---------+-----------+----------+--------------+ LEFT     CompressibilityPhasicitySpontaneityPropertiesThrombus Aging +---------+---------------+---------+-----------+----------+--------------+ CFV      Full           Yes      Yes                                 +---------+---------------+---------+-----------+----------+--------------+ SFJ      Full                                                         +---------+---------------+---------+-----------+----------+--------------+  FV Prox  Full                                                        +---------+---------------+---------+-----------+----------+--------------+ FV Mid   Full                                                        +---------+---------------+---------+-----------+----------+--------------+ FV DistalFull                                                        +---------+---------------+---------+-----------+----------+--------------+ PFV      Full                                                        +---------+---------------+---------+-----------+----------+--------------+ POP      Full           Yes      Yes                                 +---------+---------------+---------+-----------+----------+--------------+ PTV      Full                                                        +---------+---------------+---------+-----------+----------+--------------+ PERO     Full                                                        +---------+---------------+---------+-----------+----------+--------------+    Summary: RIGHT: - No evidence of common femoral vein obstruction.  LEFT: - There is no evidence of deep vein thrombosis in the lower extremity.  - No cystic structure found in the popliteal fossa.  *See table(s) above for measurements and observations. Electronically signed by Deitra Mayo MD on 09/18/2020 at 5:11:31 PM.    Final     Procedures Procedures   Medications Ordered in ED Medications - No data to display  ED Course  I have reviewed the triage vital signs and the nursing notes.  Pertinent labs & imaging results that were available during my care of the patient were reviewed by me and considered in my medical decision making (see chart for details).    MDM Rules/Calculators/A&P                           70 year old male with past ministry of PAD, PVD who presents  for evaluation of left lower extremity swelling and began yesterday.  Reports walking a lot yesterday and afterwards, noted some swelling around his left ankle, left foot.  No trauma, injury, fall.  Overlying warmth, erythema.  No fevers he has baseline numbness secondary to his procedure but states it is not worse.  He denies any chest pain, difficulty breathing.  On initial arrival, he is afebrile, nontoxic-appearing.  Vital signs are stable.  He has some mild swelling around the left ankle, left foot.  No overlying warmth, erythema.  Good Doppler pulse.  Left lower extremity is not cool to touch or dusky in appearance.  He does have some pain occasionally around the tendon.  Question of there is some tendinitis that is causing some inflammation.  Labs, ultrasound, x-ray ordered at triage.  Ultrasound shows no evidence of blood clot.  X-ray of foot negative.  CBC shows slight leukocytosis of 13.8.  CMP shows BUN of 19, creatinine 1.65.  Discussed results with patient.  At this time, there is no overlying warmth, erythema.  He is not any fever.  Not suspect this is infectious etiology.  Patient instructed to follow-up with his vascular doctor as directed. At this time, patient exhibits no emergent life-threatening condition that require further evaluation in ED. Discussed patient with Dr. Tyrone Nine who is agreeable. Patient had ample opportunity for questions and discussion. All patient's questions were answered with full understanding. Strict return precautions discussed. Patient expresses understanding and agreement to plan.   Portions of this note were generated with [employer]. Dictation errors may occur despite best attempts at proofreading.   Final Clinical Impression(s) / ED Diagnoses Final diagnoses:  Left leg swelling    Rx / DC Orders ED Discharge Orders     None        Desma Mcgregor 09/18/20 2129    Deno Etienne, DO 09/19/20 0005

## 2020-09-18 NOTE — Progress Notes (Signed)
VASCULAR LAB    Left lower extremity venous duplex has been performed.  See CV proc for preliminary results.  Messaged results to Karolee Stamps, RN  Mauro Kaufmann, [facility], RVT 09/18/2020, 3:49 PM

## 2020-09-18 NOTE — Discharge Instructions (Addendum)
Your ultrasound today did not show any evidence of blood clot.  Your x-ray looked reassuring.  Your blood work was reassuring.  Please follow-up with your vascular surgeon.  Return to the emergency department for any overlying warmth, redness, pain, fevers or any other worsening concerning symptoms.

## 2020-09-18 NOTE — ED Triage Notes (Signed)
Pt reports swelling and numbness to LLE since bypass surgery 3 months ago.  Denies chest pain and SOB.

## 2020-10-11 ENCOUNTER — Other Ambulatory Visit: Payer: Self-pay

## 2020-10-11 ENCOUNTER — Encounter: Payer: Self-pay | Admitting: Family Medicine

## 2020-10-11 ENCOUNTER — Ambulatory Visit (INDEPENDENT_AMBULATORY_CARE_PROVIDER_SITE_OTHER): Payer: Medicare Other | Admitting: Family Medicine

## 2020-10-11 VITALS — BP 136/70 | HR 79 | Temp 97.7°F | Ht 68.0 in | Wt 153.0 lb

## 2020-10-11 DIAGNOSIS — M79672 Pain in left foot: Secondary | ICD-10-CM

## 2020-10-11 DIAGNOSIS — I739 Peripheral vascular disease, unspecified: Secondary | ICD-10-CM

## 2020-10-11 DIAGNOSIS — D72829 Elevated white blood cell count, unspecified: Secondary | ICD-10-CM

## 2020-10-11 DIAGNOSIS — M79671 Pain in right foot: Secondary | ICD-10-CM

## 2020-10-11 DIAGNOSIS — F17210 Nicotine dependence, cigarettes, uncomplicated: Secondary | ICD-10-CM | POA: Diagnosis not present

## 2020-10-11 NOTE — Progress Notes (Signed)
[facility] [address] [place] [address] Dept: [phone] Dept Fax: [phone]  Chronic Care Office Visit  Subjective:    Patient ID: Gerald Carpenter, male    DOB: 1950-09-18, 70 y.o..   MRN: [id number]  Chief Complaint  Patient presents with   Follow-up    3 month f/u .  Still having issues with legs/feet.    History of Present Illness:  Patient is in today for reassessment of chronic medical issues.  Gerald Carpenter has a history of claudication due to peripheral artery disease. He underwent a left femoral to above knee popliteal artery bypass in May. He notes that in the past month or so, he has had some swelling occur in the left ankle/lower leg. He recently took a trip to [place] to go fishing. He noted when walking up hill, he had a recurrence of past symptoms where the legs felt very fatigued and he had to stop walking for a bit for them to recover. He also continues to feel burning pain sensation in the soles of his feet, esp. around the ball of the foot. He notes he was told at one point that this was due to his having a tight Achilles tendon.  Gerald Carpenter continues to smoke about a 1/4 ppd of cigarettes and has been unable to stop. He also notes he stopped takign his atorvastatin, noting someone told him it was for blood pressure. He still has quite a bit of this at home.  Past Medical History: Patient Active Problem List   Diagnosis Date Noted   Leukocytosis 10/11/2020   PAD (peripheral artery disease) ([place]) 06/29/2020   Prediabetes 04/22/2020   Chronic kidney disease, stage 3b ([place]) 04/15/2020   Tobacco dependence due to cigarettes 04/12/2020   Lower urinary tract symptoms (LUTS) 04/12/2020   History of colon polyps 04/12/2020   Atherosclerosis of native arteries of extremity with intermittent claudication ([place]) 03/01/2020   Pain in both feet 07/31/2019   Primary osteoarthritis of right knee 07/07/2019   Status  post total replacement of right hip 05/25/2019   Lumbar radiculopathy 02/05/2019   Past Surgical History:  Procedure Laterality Date   ABDOMINAL AORTOGRAM W/LOWER EXTREMITY N/A 06/23/2020   Procedure: ABDOMINAL AORTOGRAM W/LOWER EXTREMITY;  Surgeon: [name], MD;  Location: [facility];  Service: Cardiovascular;  Laterality: N/A;   ANTERIOR LAT LUMBAR FUSION Right 02/05/2019   Procedure: ATTEMPTED ANTERIOR LATERAL INTERBODY FUSION;  Surgeon: Consuella Lose, MD;  Location: [facility];  Service: Neurosurgery;  Laterality: Right;  RIGHT LATERAL TRANSPSOAS DISCECTOMY AND FUSION WITH ROBOTIC ASSISTED PEDICLE SCREW STABILIZATION, LUMBAR 4- LUMBAR 5   COLONOSCOPY     ENDOVEIN HARVEST OF GREATER SAPHENOUS VEIN Left 06/29/2020   Procedure: HARVEST OF LEFT GREATER SAPHENOUS VEIN;  Surgeon: [name], MD;  Location: [facility];  Service: Vascular;  Laterality: Left;   EYE SURGERY Bilateral 2013   lens implants for cataracts after Lasik   FEMORAL-POPLITEAL BYPASS GRAFT Left 06/29/2020   Procedure: BYPASS GRAFT LEFT COMMON FEMORAL TO ABOVE KNEE POPLITEAL ARTERY;  Surgeon: [name], MD;  Location: [facility];  Service: Vascular;  Laterality: Left;   KNEE ARTHROSCOPY Right 1980   TONSILLECTOMY     at a very young age, but grew back   TOTAL HIP ARTHROPLASTY Right 05/25/2019   Procedure: RIGHT TOTAL HIP ARTHROPLASTY ANTERIOR APPROACH;  Surgeon: Leandrew Koyanagi, MD;  Location: [facility];  Service: Orthopedics;  Laterality: Right;   Family  History  Problem Relation Age of Onset   Pulmonary fibrosis Mother    Heart disease Father    Outpatient Medications Prior to Visit  Medication Sig Dispense Refill   aspirin EC 81 MG tablet Take 1 tablet (81 mg total) by mouth daily. Swallow whole. 150 tablet 2   fluticasone (FLONASE) 50 MCG/ACT nasal spray Place 2 sprays into both nostrils in the morning.     tamsulosin (FLOMAX) 0.4 MG CAPS capsule Take 1 capsule (0.4 mg total) by mouth in the  morning. 90 capsule 3   atorvastatin (LIPITOR) 40 MG tablet Take 1 tablet (40 mg total) by mouth daily. (Patient not taking: Reported on 10/11/2020) 30 tablet 1   No facility-administered medications prior to visit.   Allergies  Allergen Reactions   Anesthetics, Amide Nausea And Vomiting    Objective:   Today's Vitals   10/11/20 0805  Pulse: 79  Temp: 97.7 F (36.5 C)  TempSrc: Temporal  SpO2: 97%  Weight: 153 lb (69.4 kg)  Height: '5\' 8"'$  (1.727 m)   Body mass index is 23.26 kg/m.   General: Well developed, well nourished. No acute distress. Extremities: Full ROM. No joint swelling or tenderness. Trace edema of the left ankle. The surgical wounds int he left leg are well healed. Cap refill of both feet is ~ 4 seconds in the toes. Psych: Alert and oriented. Normal mood and affect.  Health Maintenance Due  Topic Date Due   Zoster Vaccines- Shingrix (1 of 2) Never done   COVID-19 Vaccine (3 - Booster for Pfizer series) 10/05/2019     Lab Results  Component Value Date   CHOL 99 06/30/2020   HDL 15 (L) 06/30/2020   LDLCALC 71 06/30/2020   TRIG 66 06/30/2020   CHOLHDL 6.6 06/30/2020   Lab Results  Component Value Date   WBC 13.8 (H) 09/18/2020   HGB 15.7 09/18/2020   HCT 46.5 09/18/2020   MCV 93.6 09/18/2020   PLT 215 09/18/2020   CMP Latest Ref Rng & Units 09/18/2020 07/11/2020 06/30/2020  Glucose 70 - 99 mg/dL 106(H) 138(H) 103(H)  BUN 8 - 23 mg/dL 19 26(H) 18  Creatinine 0.61 - 1.24 mg/dL 1.65(H) 1.34 1.16  Sodium 135 - 145 mmol/L 138 137 138  Potassium 3.5 - 5.1 mmol/L 4.1 4.3 3.4(L)  Chloride 98 - 111 mmol/L 107 103 114(H)  CO2 22 - 32 mmol/L 23 23 19(L)  Calcium 8.9 - 10.3 mg/dL 8.7(L) 8.7 6.7(L)  Total Protein 6.5 - 8.1 g/dL 6.6 - -  Total Bilirubin 0.3 - 1.2 mg/dL 0.2(L) - -  Alkaline Phos 38 - 126 U/L 51 - -  AST 15 - 41 U/L 19 - -  ALT 0 - 44 U/L 19 - -   Imaging: Left Foot x-ray (09/18/2020) IMPRESSION: No acute bony abnormality. Assessment & Plan:    1. PAD (peripheral artery disease) ([place]) Despite his bypass surgery, I suspect Gerald Carpenter has some ongoing issue with smaller arterial disease that this could not correct. He has an appointment next Thursday with vascular surgery, so I recommended he discuss his ongoing claudication with them. I did recommend he restart his atorvastatin, as his last lipids were much improved. I wrote this out for him to help him remember. I will plan to check lipids again in 3 months.  2. Pain in both feet As above. I suspect this is the underlying cause of his pain.  3. Tobacco dependence due to cigarettes Gerald Carpenter's ongoing smoking remains an  issue and a risk for progression of his PAD.  4. Leukocytosis, unspecified type This has been chronic, low-grade, and long-standing. This appears to be benign.  Haydee Salter, MD

## 2020-10-19 ENCOUNTER — Ambulatory Visit: Payer: Medicare Other

## 2020-10-19 ENCOUNTER — Other Ambulatory Visit: Payer: Self-pay

## 2020-10-19 ENCOUNTER — Ambulatory Visit (INDEPENDENT_AMBULATORY_CARE_PROVIDER_SITE_OTHER)
Admission: RE | Admit: 2020-10-19 | Discharge: 2020-10-19 | Disposition: A | Discharge status: Home / Self Care | Payer: Medicare Other | Source: Ambulatory Visit | Attending: Vascular Surgery | Admitting: Vascular Surgery

## 2020-10-19 ENCOUNTER — Ambulatory Visit (HOSPITAL_COMMUNITY)
Admission: RE | Admit: 2020-10-19 | Discharge: 2020-10-19 | Disposition: A | Discharge status: Home / Self Care | Payer: Medicare Other | Source: Ambulatory Visit | Attending: Vascular Surgery | Admitting: Vascular Surgery

## 2020-10-19 ENCOUNTER — Ambulatory Visit (INDEPENDENT_AMBULATORY_CARE_PROVIDER_SITE_OTHER): Payer: Medicare Other | Admitting: Physician Assistant

## 2020-10-19 VITALS — BP 123/80 | HR 79 | Temp 98.3°F | Resp 20 | Ht 68.0 in | Wt 152.1 lb

## 2020-10-19 DIAGNOSIS — I739 Peripheral vascular disease, unspecified: Secondary | ICD-10-CM

## 2020-10-19 DIAGNOSIS — I70213 Atherosclerosis of native arteries of extremities with intermittent claudication, bilateral legs: Secondary | ICD-10-CM

## 2020-10-19 MED ORDER — ATORVASTATIN CALCIUM 40 MG PO TABS: 40.0000 mg | ORAL_TABLET | Freq: Every day | ORAL | 3 refills | Status: DC

## 2020-10-19 NOTE — Progress Notes (Signed)
Office Note     CC:  follow up Requesting Provider:  Haydee Salter, MD  HPI: Gerald Carpenter is a 70 y.o. (December 26, 1950) male who presents for postoperative surveillance of left common femoral to above-the-knee popliteal artery bypass with vein by Dr. Carlis Abbott on 06/29/2020 due to lifestyle limiting claudication.  Patient denies any rest pain or nonhealing wounds of bilateral lower extremities.  He does have claudication symptoms that are equal bilaterally if walking up a hill or a long distance however this is tolerable.  He also has history of L4-L5 fusion 2 years ago.  He also has had his right hip replaced in April 2021.  He is taking his aspirin and statin daily.  He continues to smoke about 3 cigarettes a day as well as nicotine patches and is trying to quit.   Past Medical History:  Diagnosis Date   Arthritis    Complication of anesthesia    Family history of adverse reaction to anesthesia    daughter- N/V   Nephritis    when pt. was 7 yrs. ols   Peripheral vascular disease (HCC)    PONV (postoperative nausea and vomiting)    "Only when I have aortagram on 06/23/20".   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    Past Surgical History:  Procedure Laterality Date   ABDOMINAL AORTOGRAM W/LOWER EXTREMITY N/A 06/23/2020   Procedure: ABDOMINAL AORTOGRAM W/LOWER EXTREMITY;  Surgeon: [name], MD;  Location: [facility];  Service: Cardiovascular;  Laterality: N/A;   ANTERIOR LAT LUMBAR FUSION Right 02/05/2019   Procedure: ATTEMPTED ANTERIOR LATERAL INTERBODY FUSION;  Surgeon: Consuella Lose, MD;  Location: [place];  Service: Neurosurgery;  Laterality: Right;  RIGHT LATERAL TRANSPSOAS DISCECTOMY AND FUSION WITH ROBOTIC ASSISTED PEDICLE SCREW STABILIZATION, LUMBAR 4- LUMBAR 5   COLONOSCOPY     ENDOVEIN HARVEST OF GREATER SAPHENOUS VEIN Left 06/29/2020   Procedure: HARVEST OF LEFT GREATER SAPHENOUS VEIN;  Surgeon: [name], MD;  Location: [place];  Service:  Vascular;  Laterality: Left;   EYE SURGERY Bilateral 2013   lens implants for cataracts after Lasik   FEMORAL-POPLITEAL BYPASS GRAFT Left 06/29/2020   Procedure: BYPASS GRAFT LEFT COMMON FEMORAL TO ABOVE KNEE POPLITEAL ARTERY;  Surgeon: [name], MD;  Location: [place];  Service: Vascular;  Laterality: Left;   KNEE ARTHROSCOPY Right 1980   TONSILLECTOMY     at a very young age, but grew back   TOTAL HIP ARTHROPLASTY Right 05/25/2019   Procedure: RIGHT TOTAL HIP ARTHROPLASTY ANTERIOR APPROACH;  Surgeon: Leandrew Koyanagi, MD;  Location: [place];  Service: Orthopedics;  Laterality: Right;    Social History   Socioeconomic History   Marital status: Married    Spouse name: Not on file   Number of children: Not on file   Years of education: Not on file   Highest education level: Not on file  Occupational History   Not on file  Tobacco Use   Smoking status: Some Days    Packs/day: 0.25    Years: 53.00    Pack years: 13.25    Types: Cigarettes   Smokeless tobacco: Never   Tobacco comments:    had first cigarettes age  62, habit started at age 63  Vaping Use   Vaping Use: Never used  Substance and Sexual Activity   Alcohol use: Not Currently    Comment: 05/10/1991   Drug use: Not Currently    Comment: prior usage of "speed"  when  drinking   Sexual activity: Yes  Other Topics Concern   Not on file  Social History Narrative   Recently moved here from [facility], [place]   Social Determinants of Health   Financial Resource Strain: Low Risk    Difficulty of Paying Living Expenses: Not hard at all  Food Insecurity: No Food Insecurity   Worried About Charity fundraiser in the Last Year: Never true   Arboriculturist in the Last Year: Never true  Transportation Needs: No Transportation Needs   Lack of Transportation (Medical): No   Lack of Transportation (Non-Medical): No  Physical Activity: Inactive   Days of Exercise per Week: 0 days   Minutes of Exercise per Session: 0 min   Stress: No Stress Concern Present   Feeling of Stress : Not at all  Social Connections: Moderately Isolated   Frequency of Communication with Friends and Family: More than three times a week   Frequency of Social Gatherings with Friends and Family: Once a week   Attends Religious Services: Never   Marine scientist or Organizations: No   Attends Music therapist: Never   Marital Status: Married  Human resources officer Violence: Not At Risk   Fear of Current or Ex-Partner: No   Emotionally Abused: No   Physically Abused: No   Sexually Abused: No    Family History  Problem Relation Age of Onset   Pulmonary fibrosis Mother    Heart disease Father     Current Outpatient Medications  Medication Sig Dispense Refill   aspirin EC 81 MG tablet Take 1 tablet (81 mg total) by mouth daily. Swallow whole. 150 tablet 2   fluticasone (FLONASE) 50 MCG/ACT nasal spray Place 2 sprays into both nostrils in the morning.     tamsulosin (FLOMAX) 0.4 MG CAPS capsule Take 1 capsule (0.4 mg total) by mouth in the morning. 90 capsule 3   atorvastatin (LIPITOR) 40 MG tablet Take 1 tablet (40 mg total) by mouth daily. 90 tablet 3   No current facility-administered medications for this visit.    Allergies  Allergen Reactions   Anesthetics, Amide Nausea And Vomiting     REVIEW OF SYSTEMS:   '[X]'$  denotes positive finding, '[ ]'$  denotes negative finding Cardiac  Comments:  Chest pain or chest pressure:    Shortness of breath upon exertion:    Short of breath when lying flat:    Irregular heart rhythm:        Vascular    Pain in calf, thigh, or hip brought on by ambulation:    Pain in feet at night that wakes you up from your sleep:     Blood clot in your veins:    Leg swelling:         Pulmonary    Oxygen at home:    Productive cough:     Wheezing:         Neurologic    Sudden weakness in arms or legs:     Sudden numbness in arms or legs:     Sudden onset of difficulty  speaking or slurred speech:    Temporary loss of vision in one eye:     Problems with dizziness:         Gastrointestinal    Blood in stool:     Vomited blood:         Genitourinary    Burning when urinating:     Blood in urine:  Psychiatric    Major depression:         Hematologic    Bleeding problems:    Problems with blood clotting too easily:        Skin    Rashes or ulcers:        Constitutional    Fever or chills:      PHYSICAL EXAMINATION:  Vitals:   10/19/20 0951  BP: 123/80  Pulse: 79  Resp: 20  Temp: 98.3 F (36.8 C)  TempSrc: Temporal  SpO2: 99%  Weight: 152 lb 1.6 oz (69 kg)  Height: '5\' 8"'$  (1.727 m)    General:  WDWN in NAD; vital signs documented above Gait: Not observed HENT: WNL, normocephalic Pulmonary: normal non-labored breathing , without Rales, rhonchi,  wheezing Cardiac: regular HR Abdomen: soft, NT, no masses Skin: without rashes Vascular Exam/Pulses:  Right Left  DP absent absent  PT absent absent   Extremities: without ischemic changes, without Gangrene , without cellulitis; without open wounds;  Musculoskeletal: no muscle wasting or atrophy  Neurologic: A&O X 3;  No focal weakness or paresthesias are detected Psychiatric:  The pt has Normal affect.   Non-Invasive Vascular Imaging:   Left lower extremity bypass widely patent without any hemodynamically sniffing stenosis   ABI/TBIToday's ABIToday's TBIPrevious ABIPrevious TBI  +-------+-----------+-----------+------------+------------+  Right  0.73       0.41       0.70        0.00          +-------+-----------+-----------+------------+------------+  Left   0.91       0.77       0.65        0.25      ASSESSMENT/PLAN:: 70 y.o. male here for follow up for surveillance of left lower extremity bypass  -Left lower extremity bypass is widely patent by duplex and ABIs now within normal limits -He has known PAD as well in the right lower extremity with mild  narrowing of his common femoral artery as well as tibial disease however he is without rest pain or nonhealing wounds thus we will proceed conservatively.  There is likely a component of neurogenic claudication from prior lumbar spine surgery given the symmetry of claudication symptoms -Continue aspirin and statin daily; statin prescription refilled today -Encouraged smoking cessation as well as cessation from nicotine patches -Recheck bypass duplex and ABIs in 6 months   Dagoberto Ligas, PA-C [facility] [phone]  Clinic MD:   Donzetta Matters

## 2020-10-21 ENCOUNTER — Other Ambulatory Visit: Payer: Self-pay

## 2020-10-21 DIAGNOSIS — I739 Peripheral vascular disease, unspecified: Secondary | ICD-10-CM

## 2020-10-30 IMAGING — CR DG CHEST 2V
2 series · 2 of 2 positions shown · non-contrast
Comparison: None.

CLINICAL DATA: Pre-admission films.  Patient for right hip surgery.

EXAM:
CHEST - 2 VIEW

[w chest pa]
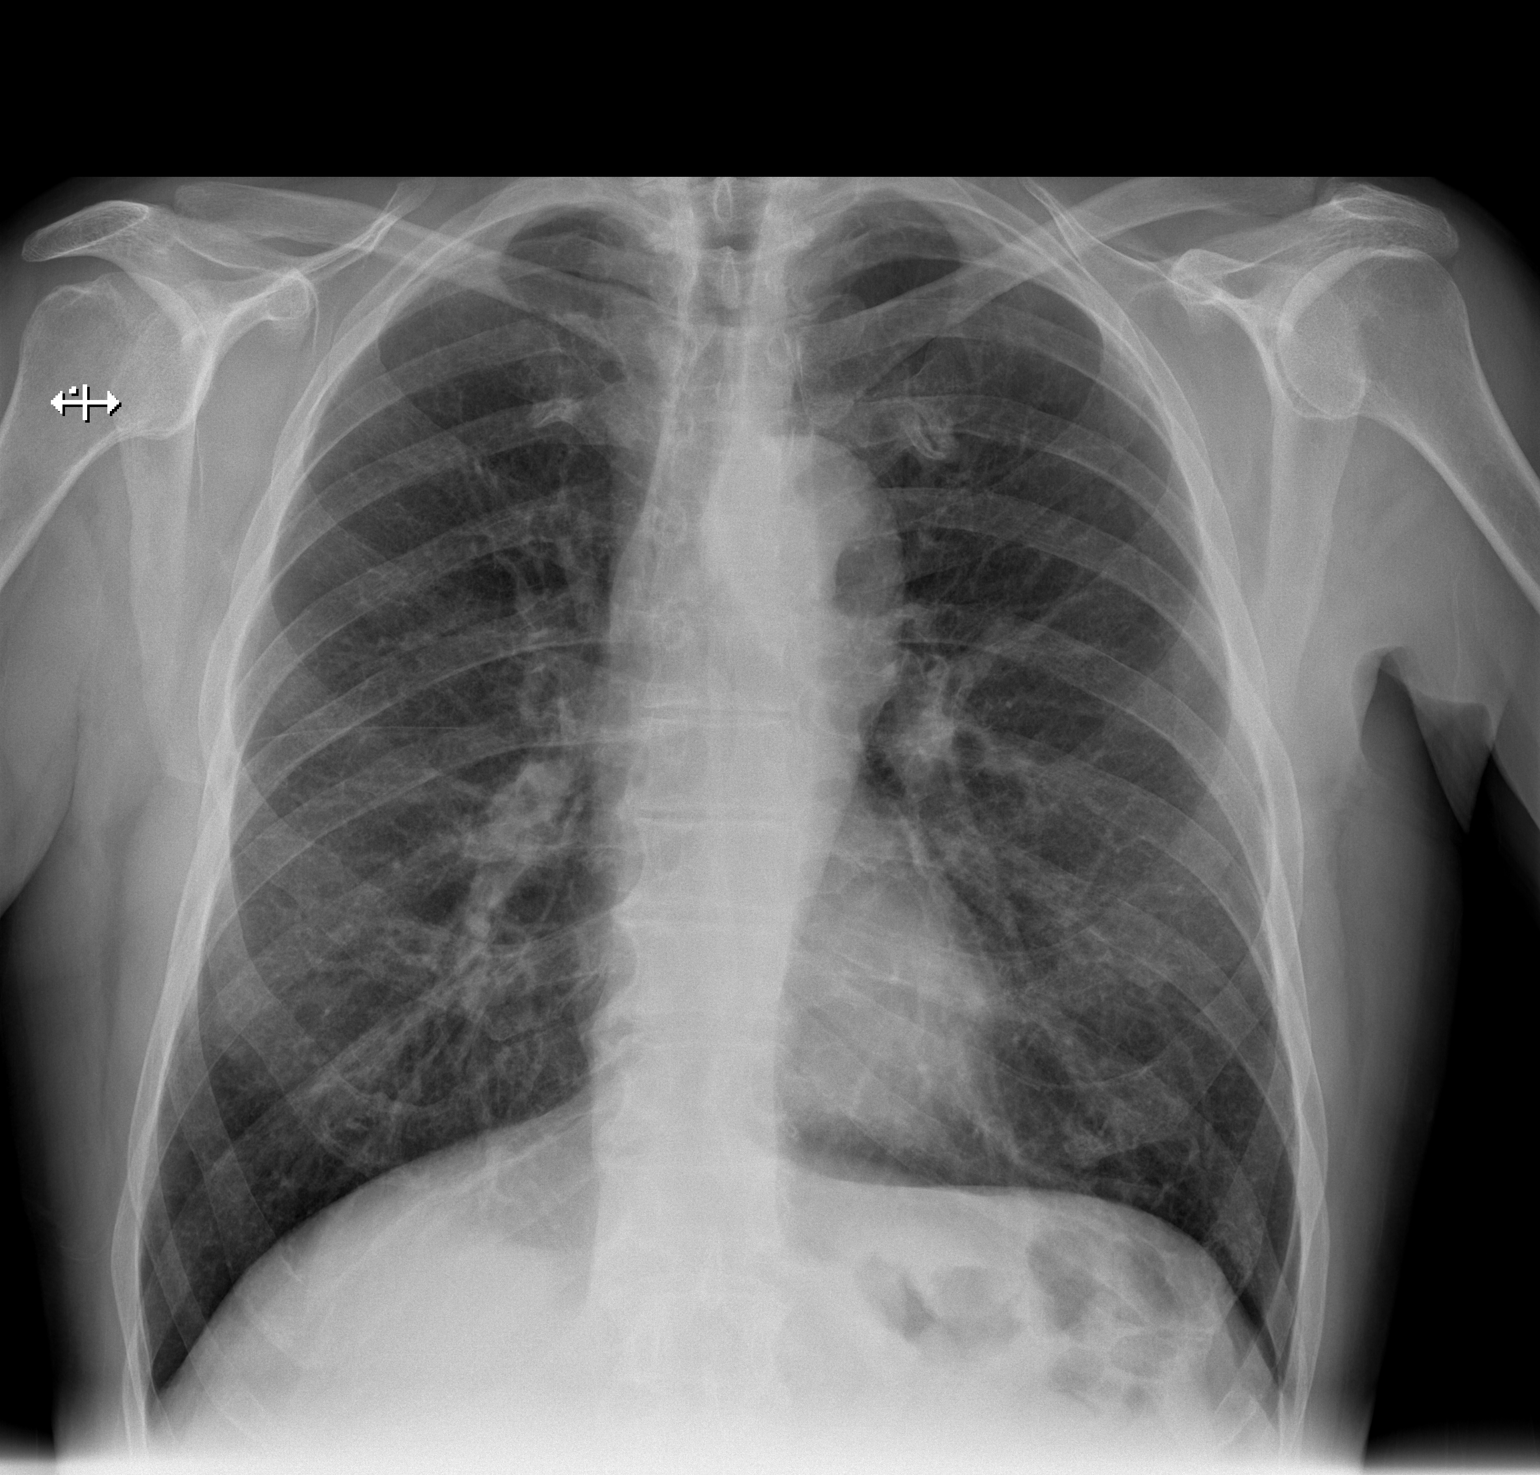

[w chest lat]
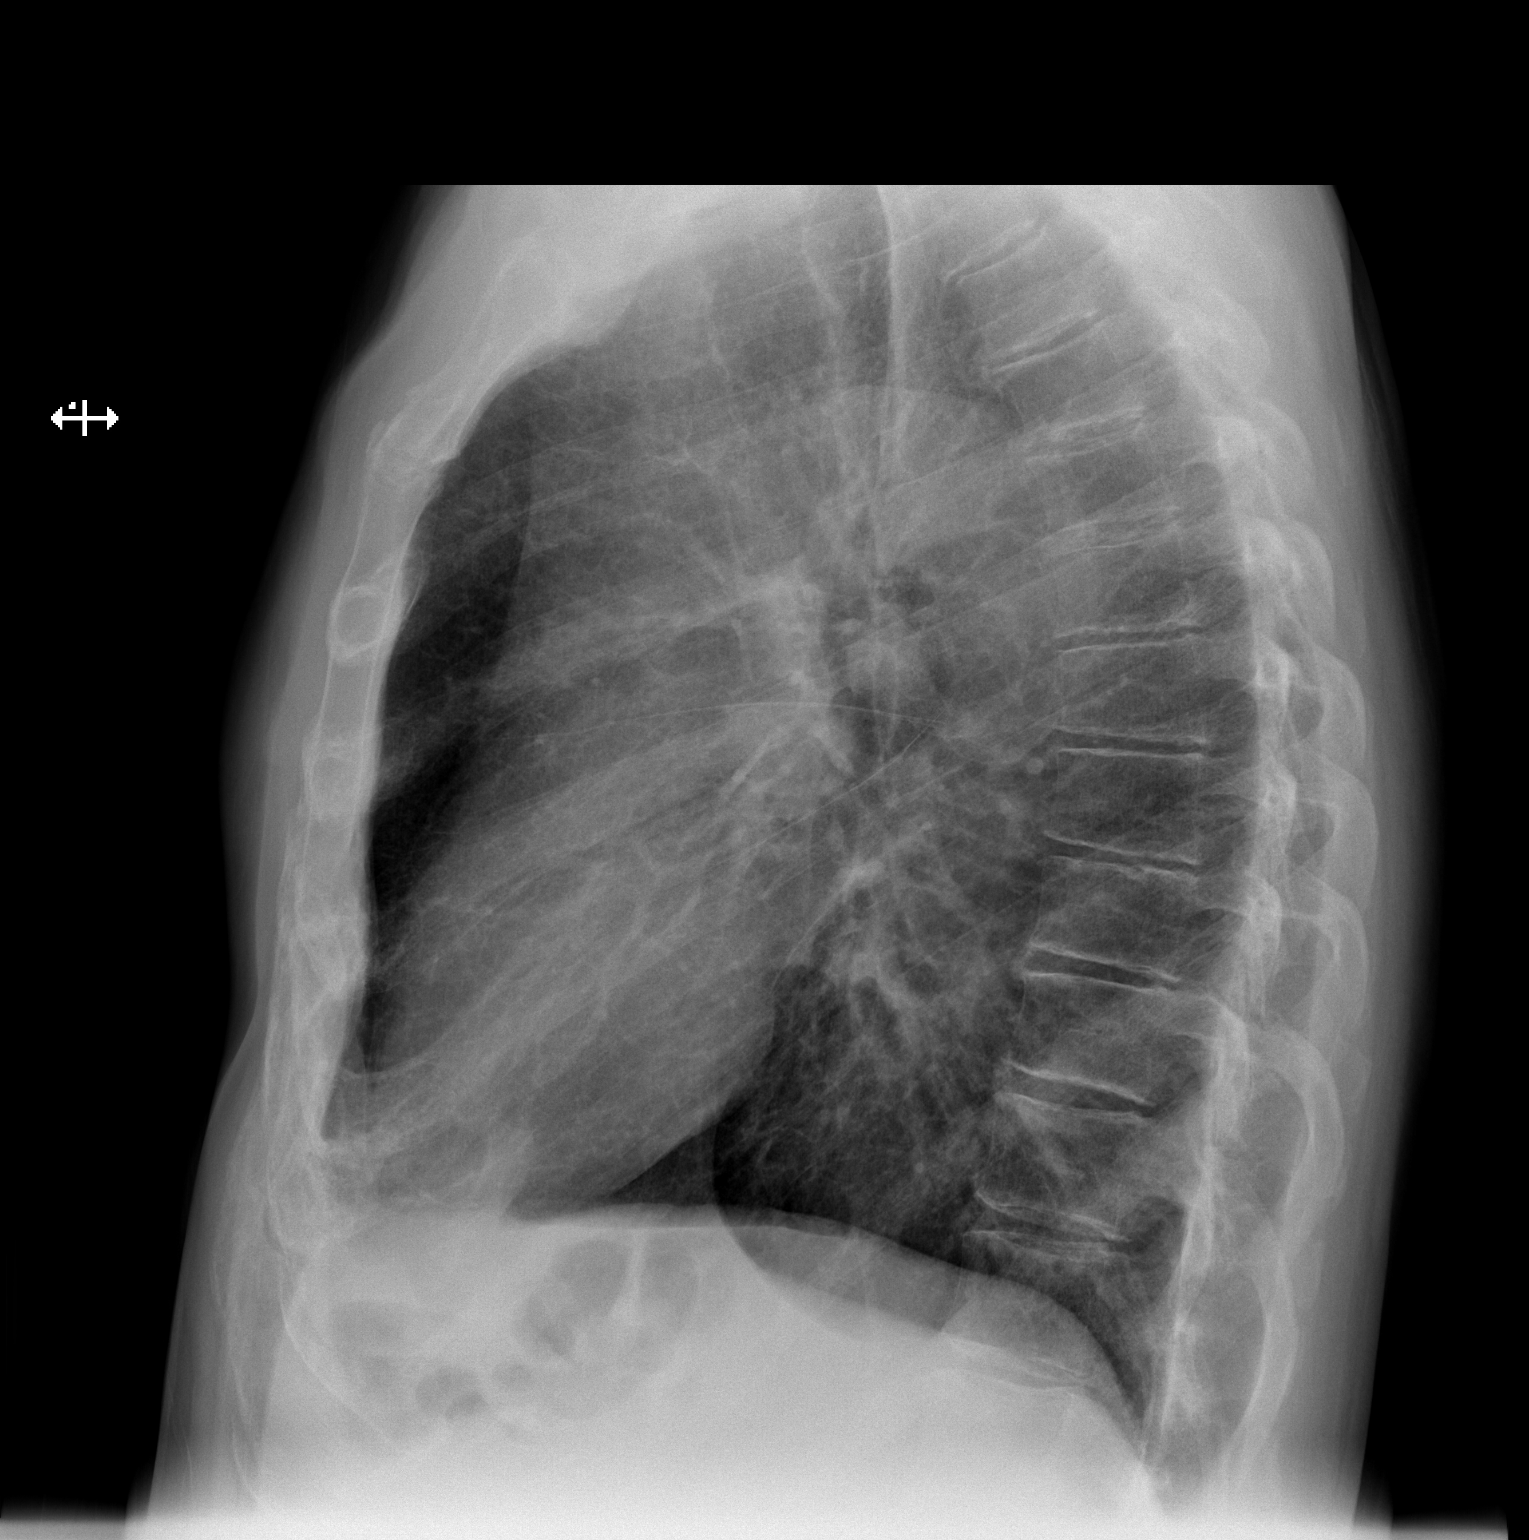

[2 of 2 positions shown; findings below may reference images not displayed]

FINDINGS: The chest is hyperexpanded with flattening of the hemidiaphragms and
attenuation of the pulmonary vasculature compatible with COPD. Lungs
are clear. No pneumothorax or pleural effusion. Heart size is
normal. No acute or focal bony abnormality.
IMPRESSION: No acute disease.

Findings compatible with COPD.

## 2020-11-17 ENCOUNTER — Other Ambulatory Visit: Payer: Self-pay

## 2020-11-17 ENCOUNTER — Encounter
Payer: Medicare Other | Attending: Physical Medicine and Rehabilitation | Admitting: Physical Medicine and Rehabilitation

## 2020-11-17 ENCOUNTER — Encounter: Payer: Self-pay | Admitting: Physical Medicine and Rehabilitation

## 2020-11-17 VITALS — BP 117/80 | HR 97 | Temp 98.4°F | Resp 95 | Ht 68.0 in | Wt 154.4 lb

## 2020-11-17 DIAGNOSIS — I739 Peripheral vascular disease, unspecified: Secondary | ICD-10-CM

## 2020-11-17 DIAGNOSIS — R5381 Other malaise: Secondary | ICD-10-CM | POA: Diagnosis present

## 2020-11-17 DIAGNOSIS — I70212 Atherosclerosis of native arteries of extremities with intermittent claudication, left leg: Secondary | ICD-10-CM

## 2020-11-17 MED ORDER — NALTREXONE HCL 50 MG PO TABS: 50.0000 mg | ORAL_TABLET | Freq: Every day | ORAL | 3 refills | Status: DC

## 2020-11-17 NOTE — Progress Notes (Signed)
Subjective:    Patient ID: Gerald Carpenter, male    DOB: March 30, 1950, 70 y.o.   MRN: [id number]  HPI Gerald Carpenter is a 70 year old man who presents to establish care for PAD with legs locking up.  -He is s/p bypass- he does not note improvements in the leg after the bypass -He has decreased sensation in his left lower leg and pain. -pain is 4/10 on average and right now. Feels intermittent, dull, aching. -He does not take any medications for pain -He is a recovering alcoholic.  -he is an active smoker  Pain Inventory Average Pain 4 Pain Right Now 4 My pain is intermittent, dull, and aching  In the last 24 hours, has pain interfered with the following? General activity 10 Relation with others 10 Enjoyment of life 0 What TIME of day is your pain at its worst? morning , daytime, evening, and night Sleep (in general) Fair  Pain is worse with: walking Pain improves with:  nothing Relief from Meds: 0  walk without assistance how many minutes can you walk? 10 ability to climb steps?  yes do you drive?  yes  retired Do you have any goals in this area?  yes  weakness numbness tingling trouble walking spasms suicidal thoughts  Any changes since last visit?  no  Primary care Monica Martinez MD Neurosurgeon Dr Laurance Flatten Orthopedist Dr Mechele Claude    Family History  Problem Relation Age of Onset   Pulmonary fibrosis Mother    Heart disease Father    Social History   Socioeconomic History   Marital status: Married    Spouse name: Not on file   Number of children: Not on file   Years of education: Not on file   Highest education level: Not on file  Occupational History   Not on file  Tobacco Use   Smoking status: Some Days    Packs/day: 0.25    Years: 53.00    Pack years: 13.25    Types: Cigarettes   Smokeless tobacco: Never   Tobacco comments:    had first cigarettes age  23, habit started at age 19  Vaping Use   Vaping Use: Never used  Substance and Sexual  Activity   Alcohol use: Not Currently    Comment: 05/10/1991   Drug use: Not Currently    Comment: prior usage of "speed"  when drinking   Sexual activity: Yes  Other Topics Concern   Not on file  Social History Narrative   Recently moved here from [facility], [place]   Social Determinants of Health   Financial Resource Strain: Low Risk    Difficulty of Paying Living Expenses: Not hard at all  Food Insecurity: No Food Insecurity   Worried About Charity fundraiser in the Last Year: Never true   Arboriculturist in the Last Year: Never true  Transportation Needs: No Transportation Needs   Lack of Transportation (Medical): No   Lack of Transportation (Non-Medical): No  Physical Activity: Inactive   Days of Exercise per Week: 0 days   Minutes of Exercise per Session: 0 min  Stress: No Stress Concern Present   Feeling of Stress : Not at all  Social Connections: Moderately Isolated   Frequency of Communication with Friends and Family: More than three times a week   Frequency of Social Gatherings with Friends and Family: Once a week   Attends Religious Services: Never   Marine scientist or Organizations: No  Attends Archivist Meetings: Never   Marital Status: Married   Past Surgical History:  Procedure Laterality Date   ABDOMINAL AORTOGRAM W/LOWER EXTREMITY N/A 06/23/2020   Procedure: ABDOMINAL AORTOGRAM W/LOWER EXTREMITY;  Surgeon: [name], MD;  Location: [facility];  Service: Cardiovascular;  Laterality: N/A;   ANTERIOR LAT LUMBAR FUSION Right 02/05/2019   Procedure: ATTEMPTED ANTERIOR LATERAL INTERBODY FUSION;  Surgeon: Consuella Lose, MD;  Location: [place];  Service: Neurosurgery;  Laterality: Right;  RIGHT LATERAL TRANSPSOAS DISCECTOMY AND FUSION WITH ROBOTIC ASSISTED PEDICLE SCREW STABILIZATION, LUMBAR 4- LUMBAR 5   COLONOSCOPY     ENDOVEIN HARVEST OF GREATER SAPHENOUS VEIN Left 06/29/2020   Procedure: HARVEST OF LEFT GREATER SAPHENOUS VEIN;   Surgeon: [name], MD;  Location: [place];  Service: Vascular;  Laterality: Left;   EYE SURGERY Bilateral 2013   lens implants for cataracts after Lasik   FEMORAL-POPLITEAL BYPASS GRAFT Left 06/29/2020   Procedure: BYPASS GRAFT LEFT COMMON FEMORAL TO ABOVE KNEE POPLITEAL ARTERY;  Surgeon: [name], MD;  Location: [place];  Service: Vascular;  Laterality: Left;   KNEE ARTHROSCOPY Right 1980   TONSILLECTOMY     at a very young age, but grew back   TOTAL HIP ARTHROPLASTY Right 05/25/2019   Procedure: RIGHT TOTAL HIP ARTHROPLASTY ANTERIOR APPROACH;  Surgeon: Leandrew Koyanagi, MD;  Location: [place];  Service: Orthopedics;  Laterality: Right;   Past Medical History:  Diagnosis Date   Arthritis    Complication of anesthesia    Family history of adverse reaction to anesthesia    daughter- N/V   Nephritis    when pt. was 70 yrs. ols   Peripheral vascular disease (HCC)    PONV (postoperative nausea and vomiting)    "Only when I have aortagram on 06/23/20".   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   BP 117/80   Pulse 97   Temp 98.4 F (36.9 C)   Resp (!) 95   Ht 5\' 8"  (1.727 m)   Wt 154 lb 6.4 oz (70 kg)   BMI 23.48 kg/m   Opioid Risk Score:   Fall Risk Score:  `1  Depression screen PHQ 2/9  Depression screen [facility] 2/9 11/17/2020 08/09/2020 04/12/2020  Decreased Interest 3 0 0  Down, Depressed, Hopeless 3 0 0  PHQ - 2 Score 6 0 0  Altered sleeping 1 - -  Tired, decreased energy 3 - -  Change in appetite 0 - -  Feeling bad or failure about yourself  2 - -  Trouble concentrating 0 - -  Moving slowly or fidgety/restless 0 - -  Suicidal thoughts 2 - -  PHQ-9 Score 14 - -  Difficult doing work/chores Extremely dIfficult - -     Review of Systems  Constitutional: Negative.   HENT: Negative.    Eyes: Negative.   Respiratory: Negative.    Cardiovascular: Negative.   Gastrointestinal: Negative.   Endocrine: Negative.   Genitourinary: Negative.    Musculoskeletal:        Leg numbness or heaviness  Skin: Negative.   Allergic/Immunologic: Negative.   Neurological:  Positive for weakness and numbness.       Tingling  Hematological: Negative.   Psychiatric/Behavioral:  Positive for dysphoric mood and suicidal ideas.   All other systems reviewed and are negative.     Objective:   Physical Exam  Gen: no distress, normal appearing HEENT: oral mucosa pink and moist, NCAT Cardio: Reg rate Chest:  normal effort, normal rate of breathing Abd: soft, non-distended Ext: no edema Psych: pleasant, normal affect Skin: intact Neuro:Decreased sensation in left lower extremity     Assessment & Plan:   Mr. Bamba is a 70 year old man who presents to establish care for lack of sensation in left leg following bypass for PAD.  1) Lack of sensation in left lower leg following bypass -Discussed Qutenza as an option for neuropathic pain control. Discussed that this is a capsaicin patch, stronger than capsaicin cream. Discussed that it is currently approved for diabetic peripheral neuropathy and post-herpetic neuralgia, but that it has also shown benefit in treating other forms of neuropathy. Provided patient with link to site to learn more about the patch: [employer]. Discussed that the patch would be placed in office and benefits usually last 3 months. Discussed that unintended exposure to capsaicin can cause severe irritation of eyes, mucous membranes, respiratory tract, and skin, but that Qutenza is a local treatment and does not have the systemic side effects of other nerve medications. Discussed that there may be pain, itching, erythema, and decreased sensory function associated with the application of Qutenza. Side effects usually subside within 1 week. A cold pack of analgesic medications can help with these side effects. Blood pressure can also be increased due to pain associated with administration of the patch.  -discussed smoking  as a risk factor.  2) Active smoker: -discussed benefits of reducing smoking.  -discussed cold [place] -discussed use of the lozenges and the patch.  -prescribed Naltrexone 50mg  daily.

## 2020-11-28 ENCOUNTER — Other Ambulatory Visit: Payer: Self-pay

## 2020-11-28 ENCOUNTER — Ambulatory Visit: Payer: Medicare Other | Attending: Physical Medicine and Rehabilitation | Admitting: Physical Therapy

## 2020-11-28 DIAGNOSIS — M62830 Muscle spasm of back: Secondary | ICD-10-CM | POA: Insufficient documentation

## 2020-11-28 DIAGNOSIS — R2681 Unsteadiness on feet: Secondary | ICD-10-CM | POA: Insufficient documentation

## 2020-11-28 DIAGNOSIS — M5441 Lumbago with sciatica, right side: Secondary | ICD-10-CM | POA: Insufficient documentation

## 2020-11-28 DIAGNOSIS — M6281 Muscle weakness (generalized): Secondary | ICD-10-CM | POA: Insufficient documentation

## 2020-11-28 DIAGNOSIS — R262 Difficulty in walking, not elsewhere classified: Secondary | ICD-10-CM | POA: Insufficient documentation

## 2020-11-28 NOTE — Therapy (Signed)
[facility]. [place], [place], [address] Phone: [phone]   Fax:  [phone]  Physical Therapy Evaluation  Patient Details  Name: Gerald Carpenter MRN: [id number] Date of Birth: Jul 25, 1950 No data recorded  Encounter Date: 11/28/2020   PT End of Session - 11/28/20 1328     Visit Number 1    Number of Visits 13    Date for PT Re-Evaluation 01/09/21    PT Start Time 1230    PT Stop Time 1318    PT Time Calculation (min) 48 min    Activity Tolerance Patient tolerated treatment well    Behavior During Therapy [facility] for tasks assessed/performed             Past Medical History:  Diagnosis Date   Arthritis    Complication of anesthesia    Family history of adverse reaction to anesthesia    daughter- N/V   Nephritis    when pt. was 7 yrs. ols   Peripheral vascular disease (HCC)    PONV (postoperative nausea and vomiting)    "Only when I have aortagram on 06/23/20".   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    Past Surgical History:  Procedure Laterality Date   ABDOMINAL AORTOGRAM W/LOWER EXTREMITY N/A 06/23/2020   Procedure: ABDOMINAL AORTOGRAM W/LOWER EXTREMITY;  Surgeon: [name], MD;  Location: [place] [facility];  Service: Cardiovascular;  Laterality: N/A;   ANTERIOR LAT LUMBAR FUSION Right 02/05/2019   Procedure: ATTEMPTED ANTERIOR LATERAL INTERBODY FUSION;  Surgeon: Consuella Lose, MD;  Location: [place];  Service: Neurosurgery;  Laterality: Right;  RIGHT LATERAL TRANSPSOAS DISCECTOMY AND FUSION WITH ROBOTIC ASSISTED PEDICLE SCREW STABILIZATION, LUMBAR 4- LUMBAR 5   COLONOSCOPY     ENDOVEIN HARVEST OF GREATER SAPHENOUS VEIN Left 06/29/2020   Procedure: HARVEST OF LEFT GREATER SAPHENOUS VEIN;  Surgeon: [name], MD;  Location: [place];  Service: Vascular;  Laterality: Left;   EYE SURGERY Bilateral 2013   lens implants for cataracts after Lasik   FEMORAL-POPLITEAL BYPASS GRAFT  Left 06/29/2020   Procedure: BYPASS GRAFT LEFT COMMON FEMORAL TO ABOVE KNEE POPLITEAL ARTERY;  Surgeon: [name], MD;  Location: [place];  Service: Vascular;  Laterality: Left;   KNEE ARTHROSCOPY Right 1980   TONSILLECTOMY     at a very young age, but grew back   TOTAL HIP ARTHROPLASTY Right 05/25/2019   Procedure: RIGHT TOTAL HIP ARTHROPLASTY ANTERIOR APPROACH;  Surgeon: Leandrew Koyanagi, MD;  Location: [place];  Service: Orthopedics;  Laterality: Right;    There were no vitals filed for this visit.    Subjective Assessment - 11/28/20 1235     Subjective Patient presents today reporting stiffness, limited gait due to legs feeling "dead".    Pertinent History L fem-pop bypass graft, Lumbar fusion, R THR,    How long can you walk comfortably? limited    Patient Stated Goals Build leg strength.    Currently in Pain? Yes    Pain Location Hip    Pain Orientation Left;Right;Proximal    Pain Descriptors / Indicators Other (Comment);Heaviness;Tightness    Pain Type Chronic pain;Acute pain    Pain Onset Other (comment)    Pain Frequency Intermittent    Aggravating Factors  walking    Pain Relieving Factors nothing    Effect of Pain on Daily Activities limited walking.                [facility] PT Assessment -  11/28/20 1303       ROM / Strength   AROM / PROM / Strength AROM;Strength      AROM   Overall AROM  Within functional limits for tasks performed    Overall AROM Comments general stiffness throughout trunk and LEs.      Strength   Strength Assessment Site Hip;Knee;Ankle    Right/Left Hip Right;Left    Right Hip Flexion 3+/5    Right Hip Extension 3+/5    Right Hip ABduction 3+/5    Left Hip Flexion 4-/5    Left Hip Extension 3+/5    Left Hip ABduction 3+/5    Right/Left Knee Right;Left    Right Knee Flexion 3+/5    Right Knee Extension 3+/5    Left Knee Flexion 4-/5    Left Knee Extension 4-/5    Right/Left Ankle Right;Left    Right Ankle Dorsiflexion 3+/5     Right Ankle Plantar Flexion 3+/5    Left Ankle Dorsiflexion 3+/5    Left Ankle Plantar Flexion 3+/5      Ambulation/Gait   Gait Pattern Decreased step length - left;Decreased step length - right;Step-through pattern;Wide base of support;Lateral hip instability                        Objective measurements completed on examination: See above findings.                PT Education - 11/28/20 1327     Education Details Patient educated to HEP, encouraged to emphasize quality over quantity. POC    Person(s) Educated Patient    Methods Explanation;Demonstration;Tactile cues;Verbal cues;Handout    Comprehension Verbal cues required;Tactile cues required;Returned demonstration;Verbalized understanding              PT Short Term Goals - 11/28/20 1335       PT SHORT TERM GOAL #1   Title independent with initial HEP    Baseline Initiated HEP    Time 2    Period Weeks    Status New    Target Date 12/12/20               PT Long Term Goals - 11/28/20 1341       PT LONG TERM GOAL #1   Title Increase strength in BLE to 4+/5    Baseline 3+-(4-)/5    Time 6    Period Weeks    Status New    Target Date 01/09/21      PT LONG TERM GOAL #2   Title Patient will be able to walk > 1000' without stopping due to pain, I, no AD, on level and unlevel surfaces.    Baseline Patient reports severe limitaitons in distance.    Time 6    Period Weeks    Status New    Target Date 01/09/21      PT LONG TERM GOAL #3   Title Increase flexibility in spine and hips to allow patient to cross either leg over the other to don shoes.    Baseline too stiff to reach    Time 6    Period Weeks    Status New    Target Date 01/09/21      PT LONG TERM GOAL #4   Title Climb up and down at least 12 stairs without handrail, step over step, I.    Baseline N/T    Time 6    Period Weeks    Status  New    Target Date 01/09/21                    Plan - 11/28/20  1330     Personal Factors and Comorbidities Age;Past/Current Experience;Comorbidity 2    Comorbidities arthritis, PVD    Examination-Activity Limitations Bathing;Locomotion Level;Bend;Sleep;Squat;Dressing;Stand    Examination-Participation Restrictions Driving;Community Activity    Stability/Clinical Decision Making Evolving/Moderate complexity    Clinical Decision Making Moderate    Rehab Potential Good    PT Frequency 2x / week    PT Duration 6 weeks    PT Treatment/Interventions ADLs/Self Care Home Management;Iontophoresis 4mg /ml Dexamethasone;Gait training;Stair training;Functional mobility training;Therapeutic activities;Therapeutic exercise;Moist Heat;Balance training;Neuromuscular re-education;Manual techniques;Dry needling;Vasopneumatic Device;Patient/family education;Electrical Stimulation    PT Next Visit Plan Progress HEP, progressive strength and flexibility program, balance training    Consulted and Agree with Plan of Care Patient             Patient will benefit from skilled therapeutic intervention in order to improve the following deficits and impairments:  Abnormal gait, Difficulty walking, Increased fascial restricitons, Decreased endurance, Increased muscle spasms, Decreased activity tolerance, Pain, Decreased balance, Impaired flexibility, Decreased strength, Postural dysfunction  Visit Diagnosis: Difficulty in walking, not elsewhere classified  Muscle weakness (generalized)  Muscle spasm of back  Unsteadiness on feet     Problem List Patient Active Problem List   Diagnosis Date Noted   Leukocytosis 10/11/2020   PAD (peripheral artery disease) ([place]) 06/29/2020   Prediabetes 04/22/2020   Chronic kidney disease, stage 3b ([place]) 04/15/2020   Tobacco dependence due to cigarettes 04/12/2020   Lower urinary tract symptoms (LUTS) 04/12/2020   History of colon polyps 04/12/2020   Atherosclerosis of native arteries of extremity with intermittent claudication  ([place]) 03/01/2020   Pain in both feet 07/31/2019   Primary osteoarthritis of right knee 07/07/2019   Status post total replacement of right hip 05/25/2019   Lumbar radiculopathy 02/05/2019    Marcelina Morel, DPT 11/28/2020, 1:49 PM  [place]. [place], [place], [address] Phone: [phone]   Fax:  [phone]  Name: Gerald Carpenter MRN: [id number] Date of Birth: 1951/02/11

## 2020-11-28 NOTE — Patient Instructions (Signed)
Access Code: [email or web address] URL: [email or web address] Date: 11/28/2020 Prepared by: Ethel Rana  Exercises Supine Bridge - 1 x daily - 7 x weekly - 1 sets - 10 reps Supine Straight Leg Raise - 1 x daily - 7 x weekly - 1 sets - 10 reps Standing Bilateral Heel Raise on Step - 1 x daily - 7 x weekly - 20 reps Hooklying Bilateral Isometric Clamshell - 1 x daily - 7 x weekly - 1 sets - 10 reps Side Stepping with Resistance at Feet - 1 x daily - 7 x weekly - 3 sets - 10 reps

## 2020-11-30 ENCOUNTER — Other Ambulatory Visit: Payer: Self-pay

## 2020-11-30 ENCOUNTER — Ambulatory Visit: Payer: Medicare Other | Admitting: Physical Therapy

## 2020-11-30 DIAGNOSIS — M62830 Muscle spasm of back: Secondary | ICD-10-CM

## 2020-11-30 DIAGNOSIS — M6281 Muscle weakness (generalized): Secondary | ICD-10-CM

## 2020-11-30 DIAGNOSIS — R262 Difficulty in walking, not elsewhere classified: Secondary | ICD-10-CM | POA: Diagnosis not present

## 2020-11-30 DIAGNOSIS — R2681 Unsteadiness on feet: Secondary | ICD-10-CM

## 2020-11-30 DIAGNOSIS — M5441 Lumbago with sciatica, right side: Secondary | ICD-10-CM

## 2020-11-30 NOTE — Therapy (Signed)
[place]. [place], [place], [address] Phone: [phone]   Fax:  [phone]  Physical Therapy Treatment  Patient Details  Name: Gerald Carpenter MRN: [id number] Date of Birth: 24-Mar-1950 No data recorded  Encounter Date: 11/30/2020   PT End of Session - 11/30/20 1708     Visit Number 2    Number of Visits 13    Date for PT Re-Evaluation 01/09/21    PT Start Time 1700    PT Stop Time 1745    PT Time Calculation (min) 45 min    Activity Tolerance Patient tolerated treatment well    Behavior During Therapy [facility] for tasks assessed/performed             Past Medical History:  Diagnosis Date   Arthritis    Complication of anesthesia    Family history of adverse reaction to anesthesia    daughter- N/V   Nephritis    when pt. was 7 yrs. ols   Peripheral vascular disease (HCC)    PONV (postoperative nausea and vomiting)    "Only when I have aortagram on 06/23/20".   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    Past Surgical History:  Procedure Laterality Date   ABDOMINAL AORTOGRAM W/LOWER EXTREMITY N/A 06/23/2020   Procedure: ABDOMINAL AORTOGRAM W/LOWER EXTREMITY;  Surgeon: [name], MD;  Location: [place] [facility];  Service: Cardiovascular;  Laterality: N/A;   ANTERIOR LAT LUMBAR FUSION Right 02/05/2019   Procedure: ATTEMPTED ANTERIOR LATERAL INTERBODY FUSION;  Surgeon: Consuella Lose, MD;  Location: [place];  Service: Neurosurgery;  Laterality: Right;  RIGHT LATERAL TRANSPSOAS DISCECTOMY AND FUSION WITH ROBOTIC ASSISTED PEDICLE SCREW STABILIZATION, LUMBAR 4- LUMBAR 5   COLONOSCOPY     ENDOVEIN HARVEST OF GREATER SAPHENOUS VEIN Left 06/29/2020   Procedure: HARVEST OF LEFT GREATER SAPHENOUS VEIN;  Surgeon: [name], MD;  Location: [place];  Service: Vascular;  Laterality: Left;   EYE SURGERY Bilateral 2013   lens implants for cataracts after Lasik   FEMORAL-POPLITEAL BYPASS GRAFT  Left 06/29/2020   Procedure: BYPASS GRAFT LEFT COMMON FEMORAL TO ABOVE KNEE POPLITEAL ARTERY;  Surgeon: [name], MD;  Location: [place];  Service: Vascular;  Laterality: Left;   KNEE ARTHROSCOPY Right 1980   TONSILLECTOMY     at a very young age, but grew back   TOTAL HIP ARTHROPLASTY Right 05/25/2019   Procedure: RIGHT TOTAL HIP ARTHROPLASTY ANTERIOR APPROACH;  Surgeon: Leandrew Koyanagi, MD;  Location: [place];  Service: Orthopedics;  Laterality: Right;    There were no vitals filed for this visit.   Subjective Assessment - 11/30/20 1706     Subjective Doing well today. Has only had 2 days to try his exercises.    Pertinent History L fem-pop bypass graft, Lumbar fusion, R THR,    How long can you walk comfortably? limited    Patient Stated Goals Build leg strength.    Currently in Pain? Yes                               [facility] Adult PT Treatment/Exercise - 11/30/20 0001       Exercises   Exercises Knee/Hip      Knee/Hip Exercises: Aerobic   Nustep L4 x 7 min UEs/LEs      Knee/Hip Exercises: Standing   Heel Raises Both;20 reps    Heel Raises Limitations single leg  on R x10 reps    Hip Extension Stengthening;Both;2 sets;10 reps    Extension Limitations red tband    Wall Squat 10 reps      Knee/Hip Exercises: Supine   Bridges Strengthening;2 sets;10 reps    Straight Leg Raises Strengthening;2 sets;10 reps;Right;Left      Knee/Hip Exercises: Sidelying   Clams red tband 2x10                     PT Education - 11/30/20 1736     Education Details Updated HEP.    Person(s) Educated Patient    Methods Explanation;Demonstration;Tactile cues    Comprehension Verbalized understanding;Returned demonstration;Verbal cues required;Tactile cues required              PT Short Term Goals - 11/28/20 1335       PT SHORT TERM GOAL #1   Title independent with initial HEP    Baseline Initiated HEP    Time 2    Period Weeks    Status New     Target Date 12/12/20               PT Long Term Goals - 11/28/20 1341       PT LONG TERM GOAL #1   Title Increase strength in BLE to 4+/5    Baseline 3+-(4-)/5    Time 6    Period Weeks    Status New    Target Date 01/09/21      PT LONG TERM GOAL #2   Title Patient will be able to walk > 1000' without stopping due to pain, I, no AD, on level and unlevel surfaces.    Baseline Patient reports severe limitaitons in distance.    Time 6    Period Weeks    Status New    Target Date 01/09/21      PT LONG TERM GOAL #3   Title Increase flexibility in spine and hips to allow patient to cross either leg over the other to don shoes.    Baseline too stiff to reach    Time 6    Period Weeks    Status New    Target Date 01/09/21      PT LONG TERM GOAL #4   Title Climb up and down at least 12 stairs without handrail, step over step, I.    Baseline N/T    Time 6    Period Weeks    Status New    Target Date 01/09/21                   Plan - 11/30/20 1718     Clinical Impression Statement Treatment focused on improving strength and endurance in bilat LEs. Pt demos extensor lag on R LE during SLR, highly challenged with hip abduction. Updated HEP.    Personal Factors and Comorbidities Age;Past/Current Experience;Comorbidity 2    Comorbidities arthritis, PVD    Examination-Activity Limitations Bathing;Locomotion Level;Bend;Sleep;Squat;Dressing;Stand    Examination-Participation Restrictions Driving;Community Activity    Stability/Clinical Decision Making Evolving/Moderate complexity    Rehab Potential Good    PT Frequency 2x / week    PT Duration 6 weeks    PT Treatment/Interventions ADLs/Self Care Home Management;Iontophoresis 4mg /ml Dexamethasone;Gait training;Stair training;Functional mobility training;Therapeutic activities;Therapeutic exercise;Moist Heat;Balance training;Neuromuscular re-education;Manual techniques;Dry needling;Vasopneumatic Device;Patient/family  education;Electrical Stimulation    PT Next Visit Plan Progress HEP, progressive strength and flexibility program, balance training    PT Home Exercise Plan Access Code: [email or web address]  Consulted and Agree with Plan of Care Patient             Patient will benefit from skilled therapeutic intervention in order to improve the following deficits and impairments:  Abnormal gait, Difficulty walking, Increased fascial restricitons, Decreased endurance, Increased muscle spasms, Decreased activity tolerance, Pain, Decreased balance, Impaired flexibility, Decreased strength, Postural dysfunction  Visit Diagnosis: Difficulty in walking, not elsewhere classified  Muscle weakness (generalized)  Muscle spasm of back  Unsteadiness on feet  Acute right-sided low back pain with right-sided sciatica     Problem List Patient Active Problem List   Diagnosis Date Noted   Leukocytosis 10/11/2020   PAD (peripheral artery disease) ([place]) 06/29/2020   Prediabetes 04/22/2020   Chronic kidney disease, stage 3b ([place]) 04/15/2020   Tobacco dependence due to cigarettes 04/12/2020   Lower urinary tract symptoms (LUTS) 04/12/2020   History of colon polyps 04/12/2020   Atherosclerosis of native arteries of extremity with intermittent claudication ([place]) 03/01/2020   Pain in both feet 07/31/2019   Primary osteoarthritis of right knee 07/07/2019   Status post total replacement of right hip 05/25/2019   Lumbar radiculopathy 02/05/2019    [facility] Gordy Levan, PT, DPT 11/30/2020, 5:49 PM  [place]. [place], [place], [address] Phone: [phone]   Fax:  [phone]  Name: Gerald Carpenter MRN: [id number] Date of Birth: Mar 05, 1950

## 2020-12-07 ENCOUNTER — Encounter: Payer: Self-pay | Admitting: Physical Therapy

## 2020-12-07 ENCOUNTER — Other Ambulatory Visit: Payer: Self-pay

## 2020-12-07 ENCOUNTER — Ambulatory Visit: Payer: Medicare Other | Admitting: Physical Therapy

## 2020-12-07 DIAGNOSIS — R262 Difficulty in walking, not elsewhere classified: Secondary | ICD-10-CM

## 2020-12-07 DIAGNOSIS — R2681 Unsteadiness on feet: Secondary | ICD-10-CM

## 2020-12-07 DIAGNOSIS — M62830 Muscle spasm of back: Secondary | ICD-10-CM

## 2020-12-07 DIAGNOSIS — M6281 Muscle weakness (generalized): Secondary | ICD-10-CM

## 2020-12-07 NOTE — Therapy (Signed)
[place] [place]. [place], [place], [address] Phone: [phone]   Fax:  [phone]  Physical Therapy Treatment  Patient Details  Name: Gerald Carpenter MRN: [id number] Date of Birth: 26-Dec-1950 No data recorded  Encounter Date: 12/07/2020   PT End of Session - 12/07/20 1340     Visit Number 3    Number of Visits 13    Date for PT Re-Evaluation 01/09/21    PT Start Time 1300    PT Stop Time 1342    PT Time Calculation (min) 42 min    Activity Tolerance Patient tolerated treatment well    Behavior During Therapy [facility] for tasks assessed/performed             Past Medical History:  Diagnosis Date   Arthritis    Complication of anesthesia    Family history of adverse reaction to anesthesia    daughter- N/V   Nephritis    when pt. was 7 yrs. ols   Peripheral vascular disease (HCC)    PONV (postoperative nausea and vomiting)    "Only when I have aortagram on 06/23/20".   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    Past Surgical History:  Procedure Laterality Date   ABDOMINAL AORTOGRAM W/LOWER EXTREMITY N/A 06/23/2020   Procedure: ABDOMINAL AORTOGRAM W/LOWER EXTREMITY;  Surgeon: [name], MD;  Location: [place] [facility];  Service: Cardiovascular;  Laterality: N/A;   ANTERIOR LAT LUMBAR FUSION Right 02/05/2019   Procedure: ATTEMPTED ANTERIOR LATERAL INTERBODY FUSION;  Surgeon: Consuella Lose, MD;  Location: [place];  Service: Neurosurgery;  Laterality: Right;  RIGHT LATERAL TRANSPSOAS DISCECTOMY AND FUSION WITH ROBOTIC ASSISTED PEDICLE SCREW STABILIZATION, LUMBAR 4- LUMBAR 5   COLONOSCOPY     ENDOVEIN HARVEST OF GREATER SAPHENOUS VEIN Left 06/29/2020   Procedure: HARVEST OF LEFT GREATER SAPHENOUS VEIN;  Surgeon: [name], MD;  Location: [facility];  Service: Vascular;  Laterality: Left;   EYE SURGERY Bilateral 2013   lens implants for cataracts after Lasik   FEMORAL-POPLITEAL BYPASS GRAFT  Left 06/29/2020   Procedure: BYPASS GRAFT LEFT COMMON FEMORAL TO ABOVE KNEE POPLITEAL ARTERY;  Surgeon: [name], MD;  Location: [place];  Service: Vascular;  Laterality: Left;   KNEE ARTHROSCOPY Right 1980   TONSILLECTOMY     at a very young age, but grew back   TOTAL HIP ARTHROPLASTY Right 05/25/2019   Procedure: RIGHT TOTAL HIP ARTHROPLASTY ANTERIOR APPROACH;  Surgeon: Leandrew Koyanagi, MD;  Location: [place];  Service: Orthopedics;  Laterality: Right;    There were no vitals filed for this visit.   Subjective Assessment - 12/07/20 1257     Subjective "Sore" I been on the ladder for a day helping a neighbor paint their house    Currently in Pain? No/denies                               [facility] Adult PT Treatment/Exercise - 12/07/20 0001       Knee/Hip Exercises: Aerobic   Nustep L3 x 6 min LE only      Knee/Hip Exercises: Machines for Strengthening   Cybex Knee Extension 10lb 2x10    Cybex Knee Flexion 25lb 2x10    Cybex Leg Press 40lb 2x10      Knee/Hip Exercises: Standing   Lateral Step Up 1 set;10 reps;Hand Hold: 0;Step Height: 6"    Forward Step Up Both;1  set;10 reps;Hand Hold: 0;Step Height: 6"    Walking with Sports Cord 30lb 4 way x 3 each    Other Standing Knee Exercises Rows 10lb 2x10    Other Standing Knee Exercises Shoulder Ext 5lb 2x10      Knee/Hip Exercises: Seated   Ball Squeeze 2x10    Sit to Sand 2 sets;10 reps;without UE support   holding blue ball for second set                      PT Short Term Goals - 11/28/20 1335       PT SHORT TERM GOAL #1   Title independent with initial HEP    Baseline Initiated HEP    Time 2    Period Weeks    Status New    Target Date 12/12/20               PT Long Term Goals - 11/28/20 1341       PT LONG TERM GOAL #1   Title Increase strength in BLE to 4+/5    Baseline 3+-(4-)/5    Time 6    Period Weeks    Status New    Target Date 01/09/21      PT LONG TERM GOAL  #2   Title Patient will be able to walk > 1000' without stopping due to pain, I, no AD, on level and unlevel surfaces.    Baseline Patient reports severe limitaitons in distance.    Time 6    Period Weeks    Status New    Target Date 01/09/21      PT LONG TERM GOAL #3   Title Increase flexibility in spine and hips to allow patient to cross either leg over the other to don shoes.    Baseline too stiff to reach    Time 6    Period Weeks    Status New    Target Date 01/09/21      PT LONG TERM GOAL #4   Title Climb up and down at least 12 stairs without handrail, step over step, I.    Baseline N/T    Time 6    Period Weeks    Status New    Target Date 01/09/21                   Plan - 12/07/20 1341     Clinical Impression Statement Pt did well overall with a progression of LE strength. LE weakness and fatigue reported throughout. Pt ambulated with wide base of support. LE burning reported with leg extensions. LE fatigue present on leg Pess.    Personal Factors and Comorbidities Age;Past/Current Experience;Comorbidity 2    Comorbidities arthritis, PVD    Examination-Activity Limitations Bathing;Locomotion Level;Bend;Sleep;Squat;Dressing;Stand    Examination-Participation Restrictions Driving;Community Activity    Stability/Clinical Decision Making Evolving/Moderate complexity    Rehab Potential Good    PT Frequency 2x / week    PT Duration 6 weeks    PT Treatment/Interventions ADLs/Self Care Home Management;Iontophoresis 4mg /ml Dexamethasone;Gait training;Stair training;Functional mobility training;Therapeutic activities;Therapeutic exercise;Moist Heat;Balance training;Neuromuscular re-education;Manual techniques;Dry needling;Vasopneumatic Device;Patient/family education;Electrical Stimulation    PT Next Visit Plan Progress HEP, progressive strength and flexibility program, balance training             Patient will benefit from skilled therapeutic intervention in  order to improve the following deficits and impairments:  Abnormal gait, Difficulty walking, Increased fascial restricitons, Decreased endurance, Increased muscle spasms, Decreased activity  tolerance, Pain, Decreased balance, Impaired flexibility, Decreased strength, Postural dysfunction  Visit Diagnosis: Muscle weakness (generalized)  Muscle spasm of back  Difficulty in walking, not elsewhere classified  Unsteadiness on feet     Problem List Patient Active Problem List   Diagnosis Date Noted   Leukocytosis 10/11/2020   PAD (peripheral artery disease) ([place]) 06/29/2020   Prediabetes 04/22/2020   Chronic kidney disease, stage 3b ([place]) 04/15/2020   Tobacco dependence due to cigarettes 04/12/2020   Lower urinary tract symptoms (LUTS) 04/12/2020   History of colon polyps 04/12/2020   Atherosclerosis of native arteries of extremity with intermittent claudication ([place]) 03/01/2020   Pain in both feet 07/31/2019   Primary osteoarthritis of right knee 07/07/2019   Status post total replacement of right hip 05/25/2019   Lumbar radiculopathy 02/05/2019    Gerald Carpenter, PTA 12/07/2020, 1:43 PM  [place]. [place], [place], [address] Phone: [phone]   Fax:  [phone]  Name: Gerald Carpenter MRN: [id number] Date of Birth: Oct 06, 1950

## 2020-12-09 ENCOUNTER — Other Ambulatory Visit: Payer: Self-pay

## 2020-12-09 ENCOUNTER — Ambulatory Visit: Payer: Medicare Other | Admitting: Physical Therapy

## 2020-12-09 DIAGNOSIS — R262 Difficulty in walking, not elsewhere classified: Secondary | ICD-10-CM

## 2020-12-09 DIAGNOSIS — M6281 Muscle weakness (generalized): Secondary | ICD-10-CM

## 2020-12-09 NOTE — Therapy (Signed)
Gerald Carpenter. Gerald [place], [place], [address] Phone: [phone]   Fax:  [phone]  Physical Therapy Treatment  Patient Details  Name: Gerald Carpenter MRN: [id number] Date of Birth: 21-Jan-1951 No data recorded  Encounter Date: 12/09/2020   PT End of Session - 12/09/20 0823     Visit Number 4    Number of Visits 13    Date for PT Re-Evaluation 01/09/21    PT Start Time 0750    PT Stop Time 0835    PT Time Calculation (min) 45 min             Past Medical History:  Diagnosis Date   Arthritis    Complication of anesthesia    Family history of adverse reaction to anesthesia    daughter- N/V   Nephritis    when pt. was 7 yrs. ols   Peripheral vascular disease (HCC)    PONV (postoperative nausea and vomiting)    "Only when I have aortagram on 06/23/20".   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    Past Surgical History:  Procedure Laterality Date   ABDOMINAL AORTOGRAM W/LOWER EXTREMITY N/A 06/23/2020   Procedure: ABDOMINAL AORTOGRAM W/LOWER EXTREMITY;  Surgeon: [name], MD;  Location: [facility];  Service: Cardiovascular;  Laterality: N/A;   ANTERIOR LAT LUMBAR FUSION Right 02/05/2019   Procedure: ATTEMPTED ANTERIOR LATERAL INTERBODY FUSION;  Surgeon: Consuella Lose, MD;  Location: [facility];  Service: Neurosurgery;  Laterality: Right;  RIGHT LATERAL TRANSPSOAS DISCECTOMY AND FUSION WITH ROBOTIC ASSISTED PEDICLE SCREW STABILIZATION, LUMBAR 4- LUMBAR 5   COLONOSCOPY     ENDOVEIN HARVEST OF GREATER SAPHENOUS VEIN Left 06/29/2020   Procedure: HARVEST OF LEFT GREATER SAPHENOUS VEIN;  Surgeon: [name], MD;  Location: [facility];  Service: Vascular;  Laterality: Left;   EYE SURGERY Bilateral 2013   lens implants for cataracts after Lasik   FEMORAL-POPLITEAL BYPASS GRAFT Left 06/29/2020   Procedure: BYPASS GRAFT LEFT COMMON FEMORAL TO ABOVE KNEE POPLITEAL ARTERY;  Surgeon: [name], MD;  Location: [facility];  Service: Vascular;  Laterality: Left;   KNEE ARTHROSCOPY Right 1980   TONSILLECTOMY     at a very young age, but grew back   TOTAL HIP ARTHROPLASTY Right 05/25/2019   Procedure: RIGHT TOTAL HIP ARTHROPLASTY ANTERIOR APPROACH;  Surgeon: Leandrew Koyanagi, MD;  Location: [facility];  Service: Orthopedics;  Laterality: Right;    There were no vitals filed for this visit.   Subjective Assessment - 12/09/20 0751     Subjective sore but okay, helping a neighbor paint so more active    Currently in Pain? No/denies                               [facility] Adult PT Treatment/Exercise - 12/09/20 0001       Knee/Hip Exercises: Aerobic   Nustep L 5 6 min      Knee/Hip Exercises: Machines for Strengthening   Cybex Knee Extension 10lb 2x10    Cybex Knee Flexion 25lb 2x10    Cybex Leg Press 40lb 2x10      Knee/Hip Exercises: Standing   Heel Raises Both;20 reps   black bar   Heel Raises Limitations single leg on R x10 reps   black bar   Lateral Step Up 1 set;10 reps;Hand Hold: 0;Step Height: 6";Both    Forward Step Up Both;1 set;10  reps;Hand Hold: 0;Step Height: 6"    Walking with Sports Cord 30lb 4 way x 3 each    Other Standing Knee Exercises Shoulder Ext 5lb 2x10 cable pulleys   shld row on pulleys 2 x 10 10#     Knee/Hip Exercises: Seated   Ball Squeeze 2x10    Clamshell with TheraBand Green   10 x BIL , 10 x SL   Marching Both;2 sets;10 reps    Marching Limitations green tband    Sit to Sand 2 sets;10 reps;without UE support   holding wt ball 10x, chest press with ball 2nd set                      PT Short Term Goals - 12/09/20 0354       PT SHORT TERM GOAL #1   Title independent with initial HEP    Status Achieved               PT Long Term Goals - 11/28/20 1341       PT LONG TERM GOAL #1   Title Increase strength in BLE to 4+/5    Baseline 3+-(4-)/5    Time 6    Period Weeks    Status New    Target Date  01/09/21      PT LONG TERM GOAL #2   Title Patient will be able to walk > 1000' without stopping due to pain, I, no AD, on level and unlevel surfaces.    Baseline Patient reports severe limitaitons in distance.    Time 6    Period Weeks    Status New    Target Date 01/09/21      PT LONG TERM GOAL #3   Title Increase flexibility in spine and hips to allow patient to cross either leg over the other to don shoes.    Baseline too stiff to reach    Time 6    Period Weeks    Status New    Target Date 01/09/21      PT LONG TERM GOAL #4   Title Climb up and down at least 12 stairs without handrail, step over step, I.    Baseline N/T    Time 6    Period Weeks    Status New    Target Date 01/09/21                   Plan - 12/09/20 0824     Clinical Impression Statement STG met. Progressed overall strengthening. RT LE weaker than Left  and rest needed.Pt verb he felt better overall today with ex- less pain/soreness,did c/o some RT knee popping.    PT Treatment/Interventions ADLs/Self Care Home Management;Iontophoresis 97m/ml Dexamethasone;Gait training;Stair training;Functional mobility training;Therapeutic activities;Therapeutic exercise;Moist Heat;Balance training;Neuromuscular re-education;Manual techniques;Dry needling;Vasopneumatic Device;Patient/family education;Electrical Stimulation    PT Next Visit Plan Progress HEP, progressive strength and flexibility program, balance training             Patient will benefit from skilled therapeutic intervention in order to improve the following deficits and impairments:  Abnormal gait, Difficulty walking, Increased fascial restricitons, Decreased endurance, Increased muscle spasms, Decreased activity tolerance, Pain, Decreased balance, Impaired flexibility, Decreased strength, Postural dysfunction  Visit Diagnosis: Muscle weakness (generalized)  Difficulty in walking, not elsewhere classified     Problem List Patient  Active Problem List   Diagnosis Date Noted   Leukocytosis 10/11/2020   PAD (peripheral artery disease) ([place] 06/29/2020   Prediabetes 04/22/2020  Chronic kidney disease, stage 3b ([place]) 04/15/2020   Tobacco dependence due to cigarettes 04/12/2020   Lower urinary tract symptoms (LUTS) 04/12/2020   History of colon polyps 04/12/2020   Atherosclerosis of native arteries of extremity with intermittent claudication ([place]) 03/01/2020   Pain in both feet 07/31/2019   Primary osteoarthritis of right knee 07/07/2019   Status post total replacement of right hip 05/25/2019   Lumbar radiculopathy 02/05/2019    Shelie Lansing,ANGIE, PTA 12/09/2020, 8:28 AM  [place]. [place], [place], [address] Phone: [phone]   Fax:  [phone]  Name: Rhyland Hinderliter MRN: [id number] Date of Birth: [birth year]

## 2020-12-13 ENCOUNTER — Other Ambulatory Visit: Payer: Self-pay

## 2020-12-13 ENCOUNTER — Ambulatory Visit: Payer: Medicare Other | Admitting: Physical Therapy

## 2020-12-13 DIAGNOSIS — R262 Difficulty in walking, not elsewhere classified: Secondary | ICD-10-CM | POA: Diagnosis not present

## 2020-12-13 DIAGNOSIS — M62830 Muscle spasm of back: Secondary | ICD-10-CM

## 2020-12-13 DIAGNOSIS — M5441 Lumbago with sciatica, right side: Secondary | ICD-10-CM

## 2020-12-13 DIAGNOSIS — M6281 Muscle weakness (generalized): Secondary | ICD-10-CM

## 2020-12-13 DIAGNOSIS — R2681 Unsteadiness on feet: Secondary | ICD-10-CM

## 2020-12-13 NOTE — Therapy (Signed)
[place]. [place], [place], [address] Phone: [phone]   Fax:  [phone]  Physical Therapy Treatment  Patient Details  Name: Gerald Carpenter MRN: [id number] Date of Birth: 04-Nov-1950 No data recorded  Encounter Date: 12/13/2020   PT End of Session - 12/13/20 6962     Visit Number 5    Number of Visits 13    Date for PT Re-Evaluation 01/09/21    PT Start Time 0755    PT Stop Time 0841    PT Time Calculation (min) 46 min    Activity Tolerance Patient tolerated treatment well    Behavior During Therapy [facility] for tasks assessed/performed             Past Medical History:  Diagnosis Date   Arthritis    Complication of anesthesia    Family history of adverse reaction to anesthesia    daughter- N/V   Nephritis    when pt. was 7 yrs. ols   Peripheral vascular disease (HCC)    PONV (postoperative nausea and vomiting)    "Only when I have aortagram on 06/23/20".   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    Past Surgical History:  Procedure Laterality Date   ABDOMINAL AORTOGRAM W/LOWER EXTREMITY N/A 06/23/2020   Procedure: ABDOMINAL AORTOGRAM W/LOWER EXTREMITY;  Surgeon: [name], MD;  Location: [place] [facility];  Service: Cardiovascular;  Laterality: N/A;   ANTERIOR LAT LUMBAR FUSION Right 02/05/2019   Procedure: ATTEMPTED ANTERIOR LATERAL INTERBODY FUSION;  Surgeon: Consuella Lose, MD;  Location: [facility];  Service: Neurosurgery;  Laterality: Right;  RIGHT LATERAL TRANSPSOAS DISCECTOMY AND FUSION WITH ROBOTIC ASSISTED PEDICLE SCREW STABILIZATION, LUMBAR 4- LUMBAR 5   COLONOSCOPY     ENDOVEIN HARVEST OF GREATER SAPHENOUS VEIN Left 06/29/2020   Procedure: HARVEST OF LEFT GREATER SAPHENOUS VEIN;  Surgeon: [name], MD;  Location: [facility];  Service: Vascular;  Laterality: Left;   EYE SURGERY Bilateral 2013   lens implants for cataracts after Lasik   FEMORAL-POPLITEAL BYPASS GRAFT  Left 06/29/2020   Procedure: BYPASS GRAFT LEFT COMMON FEMORAL TO ABOVE KNEE POPLITEAL ARTERY;  Surgeon: [name], MD;  Location: [place];  Service: Vascular;  Laterality: Left;   KNEE ARTHROSCOPY Right 1980   TONSILLECTOMY     at a very young age, but grew back   TOTAL HIP ARTHROPLASTY Right 05/25/2019   Procedure: RIGHT TOTAL HIP ARTHROPLASTY ANTERIOR APPROACH;  Surgeon: Leandrew Koyanagi, MD;  Location: [place];  Service: Orthopedics;  Laterality: Right;    There were no vitals filed for this visit.   Subjective Assessment - 12/13/20 0755     Subjective just a little stiff this morning- getting up in the morning is the hardest thing to do. Had a hard time getting up off floor, had to get help from wife before and can get started now    Pertinent History L fem-pop bypass graft, Lumbar fusion, R THR,    How long can you walk comfortably? limited    Patient Stated Goals Build leg strength.    Currently in Pain? No/denies                               [facility] Adult PT Treatment/Exercise - 12/13/20 0001       Knee/Hip Exercises: Stretches   Active Hamstring Stretch 30 seconds;2 reps    Other Knee/Hip Stretches SKTC  10 sec holds x5 B      Knee/Hip Exercises: Standing   Other Standing Knee Exercises BW squats with green TB x12 x2; sit to stand with 10# with LLE on 6 inch box 2x10    Other Standing Knee Exercises hip excursions 1x20 all directions      Knee/Hip Exercises: Supine   Bridges 1 set;15 reps    Bridges Limitations R LE closer to rear than L, 2 super sets progressing to single leg bridge    Other Supine Knee/Hip Exercises supine clams green TB 2x15 superset                     PT Education - 12/13/20 0832     Education Details encouraged HEP despite busy schedule, exercise form and benefit    Person(s) Educated Patient    Methods Explanation;Demonstration    Comprehension Verbalized understanding;Returned demonstration               PT Short Term Goals - 12/09/20 0823       PT SHORT TERM GOAL #1   Title independent with initial HEP    Status Achieved               PT Long Term Goals - 11/28/20 1341       PT LONG TERM GOAL #1   Title Increase strength in BLE to 4+/5    Baseline 3+-(4-)/5    Time 6    Period Weeks    Status New    Target Date 01/09/21      PT LONG TERM GOAL #2   Title Patient will be able to walk > 1000' without stopping due to pain, I, no AD, on level and unlevel surfaces.    Baseline Patient reports severe limitaitons in distance.    Time 6    Period Weeks    Status New    Target Date 01/09/21      PT LONG TERM GOAL #3   Title Increase flexibility in spine and hips to allow patient to cross either leg over the other to don shoes.    Baseline too stiff to reach    Time 6    Period Weeks    Status New    Target Date 01/09/21      PT LONG TERM GOAL #4   Title Climb up and down at least 12 stairs without handrail, step over step, I.    Baseline N/T    Time 6    Period Weeks    Status New    Target Date 01/09/21                   Plan - 12/13/20 3790     Clinical Impression Statement [name] arrives today in good spirits- reports that he is feeling better and main complaint is stiffness. Has been able to get up off the floor better with out assist from his wife, still is having having a lot of trouble with R LE feeling much weaker than his L LE but it is improving. Really wants to keep focusing on building strength so that he can maintain his mobility and independence. Tolerated BW exercises well today, also incorporated more work on mobility today as stiffness also remains a major concern of his. Will continue POC, seems to be tolerating well. Has not been doing HEP- has been so busy he has not had a lot of time to focus on them.  Personal Factors and Comorbidities Age;Past/Current Experience;Comorbidity 2    Comorbidities arthritis, PVD    Examination-Activity  Limitations Bathing;Locomotion Level;Bend;Sleep;Squat;Dressing;Stand    Examination-Participation Restrictions Driving;Community Activity    Stability/Clinical Decision Making Evolving/Moderate complexity    Clinical Decision Making Moderate    Rehab Potential Good    PT Frequency 2x / week    PT Duration 6 weeks    PT Treatment/Interventions ADLs/Self Care Home Management;Iontophoresis 4mg /ml Dexamethasone;Gait training;Stair training;Functional mobility training;Therapeutic activities;Therapeutic exercise;Moist Heat;Balance training;Neuromuscular re-education;Manual techniques;Dry needling;Vasopneumatic Device;Patient/family education;Electrical Stimulation    PT Next Visit Plan Progressive strength and flexibility program, balance training. Continue to encourage HEP compliance    PT Home Exercise Plan Access Code: [email or web address]    Consulted and Agree with Plan of Care Patient             Patient will benefit from skilled therapeutic intervention in order to improve the following deficits and impairments:  Abnormal gait, Difficulty walking, Increased fascial restricitons, Decreased endurance, Increased muscle spasms, Decreased activity tolerance, Pain, Decreased balance, Impaired flexibility, Decreased strength, Postural dysfunction  Visit Diagnosis: Muscle weakness (generalized)  Difficulty in walking, not elsewhere classified  Muscle spasm of back  Unsteadiness on feet  Acute right-sided low back pain with right-sided sciatica     Problem List Patient Active Problem List   Diagnosis Date Noted   Leukocytosis 10/11/2020   PAD (peripheral artery disease) ([place]) 06/29/2020   Prediabetes 04/22/2020   Chronic kidney disease, stage 3b ([place]) 04/15/2020   Tobacco dependence due to cigarettes 04/12/2020   Lower urinary tract symptoms (LUTS) 04/12/2020   History of colon polyps 04/12/2020   Atherosclerosis of native arteries of extremity with intermittent claudication ([place]) 03/01/2020    Pain in both feet 07/31/2019   Primary osteoarthritis of right knee 07/07/2019   Status post total replacement of right hip 05/25/2019   Lumbar radiculopathy 02/05/2019   Ann Lions PT, DPT, PN2   Supplemental Physical Therapist [place]    Pager [phone] Acute Rehab Office [place]. [place], [place], [address] Phone: [phone]   Fax:  [phone]  Name: Gerald Carpenter MRN: [id number] Date of Birth: 03-Mar-1950

## 2020-12-16 ENCOUNTER — Other Ambulatory Visit: Payer: Self-pay

## 2020-12-16 ENCOUNTER — Ambulatory Visit: Payer: Medicare Other | Admitting: Physical Therapy

## 2020-12-16 DIAGNOSIS — M62830 Muscle spasm of back: Secondary | ICD-10-CM

## 2020-12-16 DIAGNOSIS — M5441 Lumbago with sciatica, right side: Secondary | ICD-10-CM

## 2020-12-16 DIAGNOSIS — R2681 Unsteadiness on feet: Secondary | ICD-10-CM

## 2020-12-16 DIAGNOSIS — M6281 Muscle weakness (generalized): Secondary | ICD-10-CM

## 2020-12-16 DIAGNOSIS — R262 Difficulty in walking, not elsewhere classified: Secondary | ICD-10-CM

## 2020-12-16 NOTE — Therapy (Signed)
[place]. [place], [place], [address] Phone: [phone]   Fax:  [phone]  Physical Therapy Treatment  Patient Details  Name: Gerald Carpenter MRN: [id number] Date of Birth: 1950-12-30 No data recorded  Encounter Date: 12/16/2020   PT End of Session - 12/16/20 0836     Visit Number 6    Number of Visits 13    Date for PT Re-Evaluation 01/09/21    PT Start Time 0754    PT Stop Time 0837    PT Time Calculation (min) 43 min    Activity Tolerance Patient tolerated treatment well    Behavior During Therapy [facility] for tasks assessed/performed             Past Medical History:  Diagnosis Date   Arthritis    Complication of anesthesia    Family history of adverse reaction to anesthesia    daughter- N/V   Nephritis    when pt. was 7 yrs. ols   Peripheral vascular disease (HCC)    PONV (postoperative nausea and vomiting)    "Only when I have aortagram on 06/23/20".   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    Past Surgical History:  Procedure Laterality Date   ABDOMINAL AORTOGRAM W/LOWER EXTREMITY N/A 06/23/2020   Procedure: ABDOMINAL AORTOGRAM W/LOWER EXTREMITY;  Surgeon: [name], MD;  Location: [place] [facility];  Service: Cardiovascular;  Laterality: N/A;   ANTERIOR LAT LUMBAR FUSION Right 02/05/2019   Procedure: ATTEMPTED ANTERIOR LATERAL INTERBODY FUSION;  Surgeon: Consuella Lose, MD;  Location: [place];  Service: Neurosurgery;  Laterality: Right;  RIGHT LATERAL TRANSPSOAS DISCECTOMY AND FUSION WITH ROBOTIC ASSISTED PEDICLE SCREW STABILIZATION, LUMBAR 4- LUMBAR 5   COLONOSCOPY     ENDOVEIN HARVEST OF GREATER SAPHENOUS VEIN Left 06/29/2020   Procedure: HARVEST OF LEFT GREATER SAPHENOUS VEIN;  Surgeon: [name], MD;  Location: [place];  Service: Vascular;  Laterality: Left;   EYE SURGERY Bilateral 2013   lens implants for cataracts after Lasik   FEMORAL-POPLITEAL BYPASS GRAFT  Left 06/29/2020   Procedure: BYPASS GRAFT LEFT COMMON FEMORAL TO ABOVE KNEE POPLITEAL ARTERY;  Surgeon: [name], MD;  Location: [facility];  Service: Vascular;  Laterality: Left;   KNEE ARTHROSCOPY Right 1980   TONSILLECTOMY     at a very young age, but grew back   TOTAL HIP ARTHROPLASTY Right 05/25/2019   Procedure: RIGHT TOTAL HIP ARTHROPLASTY ANTERIOR APPROACH;  Surgeon: Leandrew Koyanagi, MD;  Location: [place];  Service: Orthopedics;  Laterality: Right;    There were no vitals filed for this visit.   Subjective Assessment - 12/16/20 0755     Subjective I'm feeling a little sore and stiff. Have still been helping paint and work outside. I got to those exercises a little bit yesterday,    Pertinent History L fem-pop bypass graft, Lumbar fusion, R THR,    How long can you walk comfortably? limited    Patient Stated Goals Build leg strength.    Currently in Pain? No/denies                               [facility] Adult PT Treatment/Exercise - 12/16/20 0001       Knee/Hip Exercises: Stretches   Other Knee/Hip Stretches lumbar rotation walk around stretches 2x30 B    Other Knee/Hip Stretches seated QL stretch on stool 3x30 seconds; SKTC and DKTC  stretches      Knee/Hip Exercises: Standing   Other Standing Knee Exercises BW squats with green TB x12 x2; sit to stand with 10# with LLE on 6 inch box 2x10    Other Standing Knee Exercises hip excursions 1x20 all directions      Knee/Hip Exercises: Supine   Bridges 2 sets;Strengthening;15 reps    Bridges Limitations on B air pads superset    Bridges with Clamshell Strengthening;Both;1 set;10 reps   green TB     Knee/Hip Exercises: Sidelying   Hip ABduction Strengthening;Both;10 reps      Knee/Hip Exercises: Prone   Hip Extension Strengthening;Both;1 set;10 reps                     PT Education - 12/16/20 0835     Education Details tips to improve HEP compliance (micro sessions, snacking); exercise form  and purpose    Person(s) Educated Patient    Methods Explanation;Demonstration    Comprehension Verbalized understanding;Returned demonstration              PT Short Term Goals - 12/09/20 0823       PT SHORT TERM GOAL #1   Title independent with initial HEP    Status Achieved               PT Long Term Goals - 11/28/20 1341       PT LONG TERM GOAL #1   Title Increase strength in BLE to 4+/5    Baseline 3+-(4-)/5    Time 6    Period Weeks    Status New    Target Date 01/09/21      PT LONG TERM GOAL #2   Title Patient will be able to walk > 1000' without stopping due to pain, I, no AD, on level and unlevel surfaces.    Baseline Patient reports severe limitaitons in distance.    Time 6    Period Weeks    Status New    Target Date 01/09/21      PT LONG TERM GOAL #3   Title Increase flexibility in spine and hips to allow patient to cross either leg over the other to don shoes.    Baseline too stiff to reach    Time 6    Period Weeks    Status New    Target Date 01/09/21      PT LONG TERM GOAL #4   Title Climb up and down at least 12 stairs without handrail, step over step, I.    Baseline N/T    Time 6    Period Weeks    Status New    Target Date 01/09/21                   Plan - 12/16/20 0837     Clinical Impression Statement Gerald Carpenter arrives feeling a bit sore and more tired than usual (reports he did not sleep well last night), wants to continue working on and focusing on functional strength and mobility today. Advanced activities as able, also continued superset approach for enhanced strength building and muscle endurance. Also discussed strategies to improve HEP compliance including micro-sets and "snacking" on exercises throughout the day. Tolerated this session well but fatigued. Continue POC as planned.    Personal Factors and Comorbidities Age;Past/Current Experience;Comorbidity 2    Comorbidities arthritis, PVD    Examination-Activity  Limitations Bathing;Locomotion Level;Bend;Sleep;Squat;Dressing;Stand    Examination-Participation Restrictions Driving;Community Activity    Stability/Clinical  Decision Making Evolving/Moderate complexity    Clinical Decision Making Moderate    Rehab Potential Good    PT Frequency 2x / week    PT Duration 6 weeks    PT Treatment/Interventions ADLs/Self Care Home Management;Iontophoresis 4mg /ml Dexamethasone;Gait training;Stair training;Functional mobility training;Therapeutic activities;Therapeutic exercise;Moist Heat;Balance training;Neuromuscular re-education;Manual techniques;Dry needling;Vasopneumatic Device;Patient/family education;Electrical Stimulation    PT Next Visit Plan Progressive strength and flexibility program, balance training. Continue to encourage HEP compliance    PT Home Exercise Plan Access Code: [email or web address]    Consulted and Agree with Plan of Care Patient             Patient will benefit from skilled therapeutic intervention in order to improve the following deficits and impairments:  Abnormal gait, Difficulty walking, Increased fascial restricitons, Decreased endurance, Increased muscle spasms, Decreased activity tolerance, Pain, Decreased balance, Impaired flexibility, Decreased strength, Postural dysfunction  Visit Diagnosis: Muscle weakness (generalized)  Muscle spasm of back  Difficulty in walking, not elsewhere classified  Unsteadiness on feet  Acute right-sided low back pain with right-sided sciatica     Problem List Patient Active Problem List   Diagnosis Date Noted   Leukocytosis 10/11/2020   PAD (peripheral artery disease) ([place]) 06/29/2020   Prediabetes 04/22/2020   Chronic kidney disease, stage 3b ([place]) 04/15/2020   Tobacco dependence due to cigarettes 04/12/2020   Lower urinary tract symptoms (LUTS) 04/12/2020   History of colon polyps 04/12/2020   Atherosclerosis of native arteries of extremity with intermittent claudication ([place]) 03/01/2020    Pain in both feet 07/31/2019   Primary osteoarthritis of right knee 07/07/2019   Status post total replacement of right hip 05/25/2019   Lumbar radiculopathy 02/05/2019   Ann Lions PT, DPT, PN2   Supplemental Physical Therapist [place]    Pager [phone] Acute Rehab Office [place]. [place], [place], [address] Phone: [phone]   Fax:  [phone]  Name: Gerald Carpenter MRN: [id number] Date of Birth: [birth year]

## 2020-12-20 ENCOUNTER — Ambulatory Visit: Payer: Medicare Other | Attending: Physical Medicine and Rehabilitation | Admitting: Physical Therapy

## 2020-12-20 ENCOUNTER — Encounter: Payer: Self-pay | Admitting: Physical Therapy

## 2020-12-20 ENCOUNTER — Other Ambulatory Visit: Payer: Self-pay

## 2020-12-20 DIAGNOSIS — M6281 Muscle weakness (generalized): Secondary | ICD-10-CM | POA: Diagnosis not present

## 2020-12-20 DIAGNOSIS — R2681 Unsteadiness on feet: Secondary | ICD-10-CM

## 2020-12-20 DIAGNOSIS — M62830 Muscle spasm of back: Secondary | ICD-10-CM

## 2020-12-20 DIAGNOSIS — R262 Difficulty in walking, not elsewhere classified: Secondary | ICD-10-CM

## 2020-12-20 DIAGNOSIS — M5441 Lumbago with sciatica, right side: Secondary | ICD-10-CM | POA: Diagnosis present

## 2020-12-20 NOTE — Therapy (Signed)
[address]. [place], [place], [address] Phone: [phone]   Fax:  [phone]  Physical Therapy Treatment  Patient Details  Name: Gerald Carpenter MRN: [id number] Date of Birth: 27-Jul-1950 No data recorded  Encounter Date: 12/20/2020   PT End of Session - 12/20/20 0838     Visit Number 7    Number of Visits 13    Date for PT Re-Evaluation 01/09/21    PT Start Time 0755    PT Stop Time 0837    PT Time Calculation (min) 42 min    Activity Tolerance Patient tolerated treatment well;Patient limited by fatigue    Behavior During Therapy [facility] for tasks assessed/performed             Past Medical History:  Diagnosis Date   Arthritis    Complication of anesthesia    Family history of adverse reaction to anesthesia    daughter- N/V   Nephritis    when pt. was 7 yrs. ols   Peripheral vascular disease (HCC)    PONV (postoperative nausea and vomiting)    "Only when I have aortagram on 06/23/20".   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    Past Surgical History:  Procedure Laterality Date   ABDOMINAL AORTOGRAM W/LOWER EXTREMITY N/A 06/23/2020   Procedure: ABDOMINAL AORTOGRAM W/LOWER EXTREMITY;  Surgeon: [name], MD;  Location: [place] [facility];  Service: Cardiovascular;  Laterality: N/A;   ANTERIOR LAT LUMBAR FUSION Right 02/05/2019   Procedure: ATTEMPTED ANTERIOR LATERAL INTERBODY FUSION;  Surgeon: Consuella Lose, MD;  Location: [place];  Service: Neurosurgery;  Laterality: Right;  RIGHT LATERAL TRANSPSOAS DISCECTOMY AND FUSION WITH ROBOTIC ASSISTED PEDICLE SCREW STABILIZATION, LUMBAR 4- LUMBAR 5   COLONOSCOPY     ENDOVEIN HARVEST OF GREATER SAPHENOUS VEIN Left 06/29/2020   Procedure: HARVEST OF LEFT GREATER SAPHENOUS VEIN;  Surgeon: [name], MD;  Location: [place];  Service: Vascular;  Laterality: Left;   EYE SURGERY Bilateral 2013   lens implants for cataracts after Lasik    FEMORAL-POPLITEAL BYPASS GRAFT Left 06/29/2020   Procedure: BYPASS GRAFT LEFT COMMON FEMORAL TO ABOVE KNEE POPLITEAL ARTERY;  Surgeon: [name], MD;  Location: [place];  Service: Vascular;  Laterality: Left;   KNEE ARTHROSCOPY Right 1980   TONSILLECTOMY     at a very young age, but grew back   TOTAL HIP ARTHROPLASTY Right 05/25/2019   Procedure: RIGHT TOTAL HIP ARTHROPLASTY ANTERIOR APPROACH;  Surgeon: Leandrew Koyanagi, MD;  Location: [place];  Service: Orthopedics;  Laterality: Right;    There were no vitals filed for this visit.   Subjective Assessment - 12/20/20 0757     Subjective I am very stiff and sore this morning- spent the weekend putting in a new floor. I felt fine after PT friday, a little stiff but not horribly bad. I've noticed good changes in my ability to get up from the floor and my strength. Still want to focus on mobilty and strength.I did a lot of bending over this weekend so my back is more sore than usual.    Pertinent History L fem-pop bypass graft, Lumbar fusion, R THR,    Patient Stated Goals Build leg strength and mobility    Currently in Pain? Yes    Pain Score 4     Pain Location Back    Pain Orientation Right;Left;Medial    Pain Descriptors / Indicators Tightness    Pain Type Chronic pain  Pain Onset More than a month ago    Pain Frequency Intermittent    Aggravating Factors  nothing, just always present    Pain Relieving Factors stretches    Effect of Pain on Daily Activities small to none                               [facility] Adult PT Treatment/Exercise - 12/20/20 0001       Knee/Hip Exercises: Stretches   Piriformis Stretch Both;3 reps;30 seconds    Piriformis Stretch Limitations figure 4 stretch    Other Knee/Hip Stretches SKTC x10 with 10 second holds B, DKTC 5x10 seconds B    Other Knee/Hip Stretches hip distraction 5x10 seconds B; seated QL stretch 3x30 seconds; 3D hip excursions x20 each way, standing thoracic rotations  1x20      Knee/Hip Exercises: Standing   Forward Step Up Right;1 set;10 reps    Forward Step Up Limitations focus on slow eccentric lower   6 inch step   Step Down Right;1 set;10 reps    Step Down Limitations 4 inch step eccentric lower                     PT Education - 12/20/20 0838     Education Details exercise form and purpose; importance of quality sleep, rest and recovery in physical progress/improving strength and endurance, encouraged rest day today or tomorrow due to levels of physical fatigue and soreness this session    Person(s) Educated Patient    Methods Explanation    Comprehension Verbalized understanding              PT Short Term Goals - 12/09/20 1914       PT SHORT TERM GOAL #1   Title independent with initial HEP    Status Achieved               PT Long Term Goals - 11/28/20 1341       PT LONG TERM GOAL #1   Title Increase strength in BLE to 4+/5    Baseline 3+-(4-)/5    Time 6    Period Weeks    Status New    Target Date 01/09/21      PT LONG TERM GOAL #2   Title Patient will be able to walk > 1000' without stopping due to pain, I, no AD, on level and unlevel surfaces.    Baseline Patient reports severe limitaitons in distance.    Time 6    Period Weeks    Status New    Target Date 01/09/21      PT LONG TERM GOAL #3   Title Increase flexibility in spine and hips to allow patient to cross either leg over the other to don shoes.    Baseline too stiff to reach    Time 6    Period Weeks    Status New    Target Date 01/09/21      PT LONG TERM GOAL #4   Title Climb up and down at least 12 stairs without handrail, step over step, I.    Baseline N/T    Time 6    Period Weeks    Status New    Target Date 01/09/21                   Plan - 12/20/20 0839     Clinical Impression Statement Gerald Carpenter  arrives rather stiff and sore today- reports he had an active weekend working on some flooring but is definitely  noticing a difference in his strength and ability to get up from the floor more easily. Very hypomobile and borderline antalgic this morning so spent more time than usual working on mobility work/stretching and finished with some ongoing strengthening. Progressing well, discussed recovery time, sleep quality,  and rest days to help promote physical progress and further strength building moving forward.    Personal Factors and Comorbidities Age;Past/Current Experience;Comorbidity 2    Comorbidities arthritis, PVD    Examination-Activity Limitations Bathing;Locomotion Level;Bend;Sleep;Squat;Dressing;Stand    Examination-Participation Restrictions Driving;Community Activity    Stability/Clinical Decision Making Evolving/Moderate complexity    Clinical Decision Making Moderate    Rehab Potential Good    PT Frequency 2x / week    PT Duration 6 weeks    PT Treatment/Interventions ADLs/Self Care Home Management;Iontophoresis 4mg /ml Dexamethasone;Gait training;Stair training;Functional mobility training;Therapeutic activities;Therapeutic exercise;Moist Heat;Balance training;Neuromuscular re-education;Manual techniques;Dry needling;Vasopneumatic Device;Patient/family education;Electrical Stimulation    PT Next Visit Plan Progressive strength and flexibility program, balance training. Continue to encourage HEP compliance    PT Home Exercise Plan Access Code: [email or web address]    Consulted and Agree with Plan of Care Patient             Patient will benefit from skilled therapeutic intervention in order to improve the following deficits and impairments:  Abnormal gait, Difficulty walking, Increased fascial restricitons, Decreased endurance, Increased muscle spasms, Decreased activity tolerance, Pain, Decreased balance, Impaired flexibility, Decreased strength, Postural dysfunction  Visit Diagnosis: Muscle weakness (generalized)  Muscle spasm of back  Difficulty in walking, not elsewhere  classified  Unsteadiness on feet  Acute right-sided low back pain with right-sided sciatica     Problem List Patient Active Problem List   Diagnosis Date Noted   Leukocytosis 10/11/2020   PAD (peripheral artery disease) ([place]) 06/29/2020   Prediabetes 04/22/2020   Chronic kidney disease, stage 3b ([place]) 04/15/2020   Tobacco dependence due to cigarettes 04/12/2020   Lower urinary tract symptoms (LUTS) 04/12/2020   History of colon polyps 04/12/2020   Atherosclerosis of native arteries of extremity with intermittent claudication ([place]) 03/01/2020   Pain in both feet 07/31/2019   Primary osteoarthritis of right knee 07/07/2019   Status post total replacement of right hip 05/25/2019   Lumbar radiculopathy 02/05/2019   Ann Lions PT, DPT, PN2   Supplemental Physical Therapist [place]    Pager [phone] Acute Rehab Office [address]. [place], [place], [address] Phone: [phone]   Fax:  [phone]  Name: Gerald Carpenter MRN: [id number] Date of Birth: June 13, 1950

## 2020-12-23 ENCOUNTER — Ambulatory Visit: Payer: Medicare Other | Admitting: Physical Therapy

## 2020-12-23 ENCOUNTER — Other Ambulatory Visit: Payer: Self-pay

## 2020-12-23 ENCOUNTER — Encounter: Payer: Self-pay | Admitting: Physical Therapy

## 2020-12-23 DIAGNOSIS — M6281 Muscle weakness (generalized): Secondary | ICD-10-CM

## 2020-12-23 DIAGNOSIS — M5441 Lumbago with sciatica, right side: Secondary | ICD-10-CM

## 2020-12-23 DIAGNOSIS — R2681 Unsteadiness on feet: Secondary | ICD-10-CM

## 2020-12-23 DIAGNOSIS — M62830 Muscle spasm of back: Secondary | ICD-10-CM

## 2020-12-23 NOTE — Therapy (Signed)
[address]. [place], [place], [address] Phone: [phone]   Fax:  [phone]  Physical Therapy Treatment  Patient Details  Name: Gerald Carpenter MRN: [id number] Date of Birth: [birth year] No data recorded  Encounter Date: 12/23/2020   PT End of Session - 12/23/20 0839     Visit Number [id number]    Number of Visits 13    Date for PT Re-Evaluation 01/09/21    PT Start Time 0756    PT Stop Time 0840    PT Time Calculation (min) 44 min    Activity Tolerance Patient tolerated treatment well;Patient limited by fatigue    Behavior During Therapy [facility] for tasks assessed/performed             Past Medical History:  Diagnosis Date   Arthritis    Complication of anesthesia    Family history of adverse reaction to anesthesia    daughter- N/V   Nephritis    when pt. was 7 yrs. ols   Peripheral vascular disease (HCC)    PONV (postoperative nausea and vomiting)    "Only when I have aortagram on 06/23/20".   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    Past Surgical History:  Procedure Laterality Date   ABDOMINAL AORTOGRAM W/LOWER EXTREMITY N/A 06/23/2020   Procedure: ABDOMINAL AORTOGRAM W/LOWER EXTREMITY;  Surgeon: [name], MD;  Location: [place] [facility];  Service: Cardiovascular;  Laterality: N/A;   ANTERIOR LAT LUMBAR FUSION Right 02/05/2019   Procedure: ATTEMPTED ANTERIOR LATERAL INTERBODY FUSION;  Surgeon: Consuella Lose, MD;  Location: [place];  Service: Neurosurgery;  Laterality: Right;  RIGHT LATERAL TRANSPSOAS DISCECTOMY AND FUSION WITH ROBOTIC ASSISTED PEDICLE SCREW STABILIZATION, LUMBAR 4- LUMBAR 5   COLONOSCOPY     ENDOVEIN HARVEST OF GREATER SAPHENOUS VEIN Left 06/29/2020   Procedure: HARVEST OF LEFT GREATER SAPHENOUS VEIN;  Surgeon: [name], MD;  Location: [place];  Service: Vascular;  Laterality: Left;   EYE SURGERY Bilateral 2013   lens implants for cataracts after Lasik    FEMORAL-POPLITEAL BYPASS GRAFT Left 06/29/2020   Procedure: BYPASS GRAFT LEFT COMMON FEMORAL TO ABOVE KNEE POPLITEAL ARTERY;  Surgeon: [name], MD;  Location: [place];  Service: Vascular;  Laterality: Left;   KNEE ARTHROSCOPY Right 1980   TONSILLECTOMY     at a very young age, but grew back   TOTAL HIP ARTHROPLASTY Right 05/25/2019   Procedure: RIGHT TOTAL HIP ARTHROPLASTY ANTERIOR APPROACH;  Surgeon: Leandrew Koyanagi, MD;  Location: [place];  Service: Orthopedics;  Laterality: Right;    There were no vitals filed for this visit.   Subjective Assessment - 12/23/20 0759     Subjective Feeling better. Feels the stretching has made a tremendous difference. Still doing the floor work and feels that is helping him to loosen up as well.    Pertinent History L fem-pop bypass graft, Lumbar fusion, R THR,    Currently in Pain? Yes    Pain Score 5     Pain Location Back    Pain Orientation Left;Right    Pain Descriptors / Indicators Aching;Cramping    Pain Type Chronic pain    Pain Onset More than a month ago    Pain Frequency Constant                               [facility] Adult PT Treatment/Exercise - 12/23/20 0001  Knee/Hip Exercises: Stretches   Passive Hamstring Stretch Both;3 reps;30 seconds    Passive Hamstring Stretch Limitations strap    Quad Stretch Right;3 reps;30 seconds    Quad Stretch Limitations Supine, leg off mat, flex knee    ITB Stretch Both;2 reps;30 seconds    ITB Stretch Limitations strap    Piriformis Stretch Both;3 reps;30 seconds    Piriformis Stretch Limitations figure 4 stretch    Other Knee/Hip Stretches child's pose, Thomas stretch    Other Knee/Hip Stretches Chair roll out x 5      Knee/Hip Exercises: Machines for Strengthening   Cybex Knee Extension 5lb 2 x 15    Cybex Knee Flexion 35 lb 2 x 20      Knee/Hip Exercises: Standing   Hip Abduction Stengthening;Both;2 sets;10 reps    Abduction Limitations Marga Hoots                      PT Education - 12/23/20 [phone]     Education Details Updated HEP.    Person(s) Educated Patient    Methods Explanation;Demonstration;Handout    Comprehension Verbalized understanding;Returned demonstration              PT Short Term Goals - 12/09/20 0823       PT SHORT TERM GOAL #1   Title independent with initial HEP    Status Achieved               PT Long Term Goals - 12/23/20 0807       PT LONG TERM GOAL #3   Title Increase flexibility in spine and hips to allow patient to cross either leg over the other to don shoes.    Baseline too stiff to reach    Time 3    Period Weeks    Status On-going      PT LONG TERM GOAL #4   Title Climb up and down at least 12 stairs without handrail, step over step, I.    Baseline Still holds rails for safety.    Time 3    Period Weeks    Status On-going    Target Date 01/13/21      PT LONG TERM GOAL #5   Title incresare SLR on the right to 70 degrees    Baseline 48    Time 3    Period Weeks    Status New    Target Date 01/13/21                   Plan - 12/23/20 0840     Clinical Impression Statement Patient reports overall relief    Personal Factors and Comorbidities Age;Past/Current Experience;Comorbidity 2    Comorbidities arthritis, PVD    Examination-Activity Limitations Bathing;Locomotion Level;Bend;Sleep;Squat;Dressing;Stand    Examination-Participation Restrictions Driving;Community Activity    Stability/Clinical Decision Making Evolving/Moderate complexity    Clinical Decision Making Moderate    Rehab Potential Good    PT Frequency 2x / week    PT Duration 6 weeks    PT Treatment/Interventions ADLs/Self Care Home Management;Iontophoresis 4mg /ml Dexamethasone;Gait training;Stair training;Functional mobility training;Therapeutic activities;Therapeutic exercise;Moist Heat;Balance training;Neuromuscular re-education;Manual techniques;Dry needling;Vasopneumatic  Device;Patient/family education;Electrical Stimulation    PT Next Visit Plan Progressive strength and flexibility program, balance training. Continue to encourage HEP compliance    PT Home Exercise Plan Access Code: [email or web address]    Consulted and Agree with Plan of Care Patient             Patient will benefit  from skilled therapeutic intervention in order to improve the following deficits and impairments:  Abnormal gait, Difficulty walking, Increased fascial restricitons, Decreased endurance, Increased muscle spasms, Decreased activity tolerance, Pain, Decreased balance, Impaired flexibility, Decreased strength, Postural dysfunction  Visit Diagnosis: Muscle weakness (generalized)  Muscle spasm of back  Unsteadiness on feet  Acute right-sided low back pain with right-sided sciatica     Problem List Patient Active Problem List   Diagnosis Date Noted   Leukocytosis 10/11/2020   PAD (peripheral artery disease) ([place]) 06/29/2020   Prediabetes 04/22/2020   Chronic kidney disease, stage 3b ([place]) 04/15/2020   Tobacco dependence due to cigarettes 04/12/2020   Lower urinary tract symptoms (LUTS) 04/12/2020   History of colon polyps 04/12/2020   Atherosclerosis of native arteries of extremity with intermittent claudication ([place]) 03/01/2020   Pain in both feet 07/31/2019   Primary osteoarthritis of right knee 07/07/2019   Status post total replacement of right hip 05/25/2019   Lumbar radiculopathy 02/05/2019    Marcelina Morel, DPT 12/23/2020, 8:44 AM  [address]. [place], [place], [address] Phone: [phone]   Fax:  [phone]  Name: Gerald Carpenter MRN: [id number] Date of Birth: [birth year]

## 2020-12-23 NOTE — Patient Instructions (Signed)
Access Code: [email or web address] URL: [email or web address] Date: 12/23/2020 Prepared by: Ethel Rana  Exercises Supine Bridge - 1 x daily - 7 x weekly - 1 sets - 10 reps Supine Straight Leg Raise - 1 x daily - 7 x weekly - 1 sets - 10 reps Clamshell with Resistance - 1 x daily - 7 x weekly - 2 sets - 10 reps Standing Bilateral Heel Raise on Step - 1 x daily - 7 x weekly - 20 reps Hip Extension with Resistance Loop - 1 x daily - 7 x weekly - 3 sets - 10 reps Straight Leg Raise Sciatic Nerve Flossing - 1 x daily - 7 x weekly - 3 sets - 10 reps Supine Figure 4 Piriformis Stretch - 1 x daily - 7 x weekly - 3 reps - 30 hold Supine ITB Stretch with Strap - 1 x daily - 7 x weekly - 3 reps - 30 hold Supine Gluteus Stretch - 1 x daily - 7 x weekly - 3 reps - 30 hold Diaphragmatic Breathing in Child's Pose with Pelvic Floor Relaxation - 1 x daily - 7 x weekly - 3 reps - 30 hold

## 2020-12-27 ENCOUNTER — Other Ambulatory Visit: Payer: Self-pay

## 2020-12-27 ENCOUNTER — Ambulatory Visit: Payer: Medicare Other | Admitting: Physical Therapy

## 2020-12-27 ENCOUNTER — Encounter: Payer: Self-pay | Admitting: Physical Therapy

## 2020-12-27 DIAGNOSIS — M62830 Muscle spasm of back: Secondary | ICD-10-CM

## 2020-12-27 DIAGNOSIS — M6281 Muscle weakness (generalized): Secondary | ICD-10-CM | POA: Diagnosis not present

## 2020-12-27 DIAGNOSIS — M5441 Lumbago with sciatica, right side: Secondary | ICD-10-CM

## 2020-12-27 DIAGNOSIS — R262 Difficulty in walking, not elsewhere classified: Secondary | ICD-10-CM

## 2020-12-27 DIAGNOSIS — R2681 Unsteadiness on feet: Secondary | ICD-10-CM

## 2020-12-27 NOTE — Therapy (Signed)
[address]. [place], [place], [address] Phone: [phone]   Fax:  [phone]  Physical Therapy Treatment  Patient Details  Name: Gerald Carpenter MRN: [id number] Date of Birth: October 21, 1950 No data recorded  Encounter Date: 12/27/2020   PT End of Session - 12/27/20 0828     Visit Number 9    Number of Visits 13    Date for PT Re-Evaluation 01/09/21    PT Start Time 0800    PT Stop Time 0844    PT Time Calculation (min) 44 min    Activity Tolerance Patient tolerated treatment well;Patient limited by fatigue    Behavior During Therapy [facility] for tasks assessed/performed             Past Medical History:  Diagnosis Date   Arthritis    Complication of anesthesia    Family history of adverse reaction to anesthesia    daughter- N/V   Nephritis    when pt. was 7 yrs. ols   Peripheral vascular disease (HCC)    PONV (postoperative nausea and vomiting)    "Only when I have aortagram on 06/23/20".   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    Past Surgical History:  Procedure Laterality Date   ABDOMINAL AORTOGRAM W/LOWER EXTREMITY N/A 06/23/2020   Procedure: ABDOMINAL AORTOGRAM W/LOWER EXTREMITY;  Surgeon: [name], MD;  Location: [place] [facility];  Service: Cardiovascular;  Laterality: N/A;   ANTERIOR LAT LUMBAR FUSION Right 02/05/2019   Procedure: ATTEMPTED ANTERIOR LATERAL INTERBODY FUSION;  Surgeon: Consuella Lose, MD;  Location: [place];  Service: Neurosurgery;  Laterality: Right;  RIGHT LATERAL TRANSPSOAS DISCECTOMY AND FUSION WITH ROBOTIC ASSISTED PEDICLE SCREW STABILIZATION, LUMBAR 4- LUMBAR 5   COLONOSCOPY     ENDOVEIN HARVEST OF GREATER SAPHENOUS VEIN Left 06/29/2020   Procedure: HARVEST OF LEFT GREATER SAPHENOUS VEIN;  Surgeon: [name], MD;  Location: [place];  Service: Vascular;  Laterality: Left;   EYE SURGERY Bilateral 2013   lens implants for cataracts after Lasik    FEMORAL-POPLITEAL BYPASS GRAFT Left 06/29/2020   Procedure: BYPASS GRAFT LEFT COMMON FEMORAL TO ABOVE KNEE POPLITEAL ARTERY;  Surgeon: [name], MD;  Location: [place];  Service: Vascular;  Laterality: Left;   KNEE ARTHROSCOPY Right 1980   TONSILLECTOMY     at a very young age, but grew back   TOTAL HIP ARTHROPLASTY Right 05/25/2019   Procedure: RIGHT TOTAL HIP ARTHROPLASTY ANTERIOR APPROACH;  Surgeon: Leandrew Koyanagi, MD;  Location: [place];  Service: Orthopedics;  Laterality: Right;    There were no vitals filed for this visit.   Subjective Assessment - 12/27/20 0804     Subjective Feeling better. He took a couple days off all working,did minimal stretching. Feels more stiff, but no pain.    Pertinent History L fem-pop bypass graft, Lumbar fusion, R THR,    How long can you stand comfortably? 20 minutes    How long can you walk comfortably? limited    Patient Stated Goals Build leg strength and mobility    Currently in Pain? No/denies    Pain Onset More than a month ago                               [facility] Adult PT Treatment/Exercise - 12/27/20 0001       Knee/Hip Exercises: Stretches   Passive Hamstring Stretch Both;3 reps  Passive Hamstring Stretch Limitations strap straight, med, lateral    ITB Stretch Both;2 reps;30 seconds    ITB Stretch Limitations strap      Knee/Hip Exercises: Aerobic   Nustep L 5 6 min      Knee/Hip Exercises: Standing   Hip Abduction Stengthening;Both;2 sets;10 reps    Abduction Limitations [place] Theraband      Knee/Hip Exercises: Supine   Bridges Strengthening;Both;1 set;10 reps    Bridges Limitations Repeat with legs turned in to engage adductors, then roll ball side to side x 5 in adduction.    Knee Flexion Strengthening;Both;1 set;10 reps    Knee Flexion Limitations rolls up on Physio ball while holding a bridge      Knee/Hip Exercises: Sidelying   Other Sidelying Knee/Hip Exercises side planks from knees 4 x 10  sec on each side.      Knee/Hip Exercises: Prone   Other Prone Exercises planks- 4 x 15 seconds on knees                     PT Education - 12/27/20 0839     Education Details Educated to rationale of stretch and strengthen trunk and BLE to control back and knee pain.    Person(s) Educated Patient    Methods Explanation;Demonstration    Comprehension Verbalized understanding;Returned demonstration              PT Short Term Goals - 12/27/20 0806       PT SHORT TERM GOAL #1   Title independent with initial HEP    Status Achieved               PT Long Term Goals - 12/27/20 0836       PT LONG TERM GOAL #5   Title incresare SLR on the right to 70 degrees    Baseline L 72, R 52    Time 3    Period Weeks    Status On-going    Target Date 01/13/21                   Plan - 12/27/20 0829     Clinical Impression Statement Patient reports stiffness, but not pain. he took a couple days off working and feels that hs helped reduce the pain. Patient performed challenging core strengthening activities in all planes F/B gentle stretch to reduce back pain.    Personal Factors and Comorbidities Age;Past/Current Experience;Comorbidity 2    Comorbidities arthritis, PVD    Examination-Activity Limitations Bathing;Locomotion Level;Bend;Sleep;Squat;Dressing;Stand    Examination-Participation Restrictions Driving;Community Activity    Stability/Clinical Decision Making Evolving/Moderate complexity    Rehab Potential Good    PT Frequency 2x / week    PT Duration 4 weeks    PT Treatment/Interventions ADLs/Self Care Home Management;Iontophoresis 4mg /ml Dexamethasone;Gait training;Stair training;Functional mobility training;Therapeutic activities;Therapeutic exercise;Moist Heat;Balance training;Neuromuscular re-education;Manual techniques;Dry needling;Vasopneumatic Device;Patient/family education;Electrical Stimulation    PT Next Visit Plan Progressive strength and  flexibility program, balance training. Continue to encourage HEP compliance    PT Home Exercise Plan Access Code: [email or web address]    Consulted and Agree with Plan of Care Patient             Patient will benefit from skilled therapeutic intervention in order to improve the following deficits and impairments:  Abnormal gait, Difficulty walking, Increased fascial restricitons, Decreased endurance, Increased muscle spasms, Decreased activity tolerance, Pain, Decreased balance, Impaired flexibility, Decreased strength, Postural dysfunction  Visit Diagnosis: Muscle weakness (generalized)  Muscle spasm of  back  Unsteadiness on feet  Acute right-sided low back pain with right-sided sciatica  Difficulty in walking, not elsewhere classified     Problem List Patient Active Problem List   Diagnosis Date Noted   Leukocytosis 10/11/2020   PAD (peripheral artery disease) ([place]) 06/29/2020   Prediabetes 04/22/2020   Chronic kidney disease, stage 3b ([place]) 04/15/2020   Tobacco dependence due to cigarettes 04/12/2020   Lower urinary tract symptoms (LUTS) 04/12/2020   History of colon polyps 04/12/2020   Atherosclerosis of native arteries of extremity with intermittent claudication ([place]) 03/01/2020   Pain in both feet 07/31/2019   Primary osteoarthritis of right knee 07/07/2019   Status post total replacement of right hip 05/25/2019   Lumbar radiculopathy 02/05/2019    Marcelina Morel, DPT 12/27/2020, 8:41 AM  [address]. [place], [place], [address] Phone: [phone]   Fax:  [phone]  Name: Gerald Carpenter MRN: [id number] Date of Birth: Mar 28, 1950

## 2020-12-30 ENCOUNTER — Encounter: Payer: Self-pay | Admitting: Physical Therapy

## 2020-12-30 ENCOUNTER — Other Ambulatory Visit: Payer: Self-pay

## 2020-12-30 ENCOUNTER — Ambulatory Visit: Payer: Medicare Other | Admitting: Physical Therapy

## 2020-12-30 DIAGNOSIS — M6281 Muscle weakness (generalized): Secondary | ICD-10-CM

## 2020-12-30 DIAGNOSIS — M5441 Lumbago with sciatica, right side: Secondary | ICD-10-CM

## 2020-12-30 DIAGNOSIS — R2681 Unsteadiness on feet: Secondary | ICD-10-CM

## 2020-12-30 DIAGNOSIS — M62830 Muscle spasm of back: Secondary | ICD-10-CM

## 2020-12-30 DIAGNOSIS — R262 Difficulty in walking, not elsewhere classified: Secondary | ICD-10-CM

## 2020-12-30 NOTE — Therapy (Signed)
[place] [place], [place], [address] Phone: [phone]   Fax:  [phone]  Physical Therapy Treatment  Patient Details  Name: Gerald Carpenter MRN: [id number] Date of Birth: 17-Feb-1951 Referring Provider (PT): Dr. Ranell Patrick   Encounter Date: 12/30/2020  Progress Note Reporting Period 11/28/20 to 12/30/20  See note below for Objective Data and Assessment of Progress/Goals.       PT End of Session - 12/30/20 0840     Visit Number 10    Number of Visits 18    Date for PT Re-Evaluation 02/17/21    Progress Note Due on Visit 20    PT Start Time 0758    PT Stop Time 0842    PT Time Calculation (min) 44 min    Activity Tolerance Patient tolerated treatment well;Patient limited by fatigue    Behavior During Therapy [facility] for tasks assessed/performed             Past Medical History:  Diagnosis Date   Arthritis    Complication of anesthesia    Family history of adverse reaction to anesthesia    daughter- N/V   Nephritis    when pt. was 7 yrs. ols   Peripheral vascular disease (HCC)    PONV (postoperative nausea and vomiting)    "Only when I have aortagram on 06/23/20".   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    Past Surgical History:  Procedure Laterality Date   ABDOMINAL AORTOGRAM W/LOWER EXTREMITY N/A 06/23/2020   Procedure: ABDOMINAL AORTOGRAM W/LOWER EXTREMITY;  Surgeon: [name], MD;  Location: [place] [facility];  Service: Cardiovascular;  Laterality: N/A;   ANTERIOR LAT LUMBAR FUSION Right 02/05/2019   Procedure: ATTEMPTED ANTERIOR LATERAL INTERBODY FUSION;  Surgeon: Consuella Lose, MD;  Location: [place];  Service: Neurosurgery;  Laterality: Right;  RIGHT LATERAL TRANSPSOAS DISCECTOMY AND FUSION WITH ROBOTIC ASSISTED PEDICLE SCREW STABILIZATION, LUMBAR 4- LUMBAR 5   COLONOSCOPY     ENDOVEIN HARVEST OF GREATER SAPHENOUS VEIN Left 06/29/2020   Procedure: HARVEST OF LEFT GREATER  SAPHENOUS VEIN;  Surgeon: [name], MD;  Location: [place];  Service: Vascular;  Laterality: Left;   EYE SURGERY Bilateral 2013   lens implants for cataracts after Lasik   FEMORAL-POPLITEAL BYPASS GRAFT Left 06/29/2020   Procedure: BYPASS GRAFT LEFT COMMON FEMORAL TO ABOVE KNEE POPLITEAL ARTERY;  Surgeon: [name], MD;  Location: [place];  Service: Vascular;  Laterality: Left;   KNEE ARTHROSCOPY Right 1980   TONSILLECTOMY     at a very young age, but grew back   TOTAL HIP ARTHROPLASTY Right 05/25/2019   Procedure: RIGHT TOTAL HIP ARTHROPLASTY ANTERIOR APPROACH;  Surgeon: Leandrew Koyanagi, MD;  Location: [facility];  Service: Orthopedics;  Laterality: Right;    There were no vitals filed for this visit.   Subjective Assessment - 12/30/20 0800     Subjective Since tuesday my back has been killing me. I didn't do anything specific to set it off, it comes and goes. Things have been going alright otherwise, I've been able to move a little better, I have to get moving to get things moving especially in the morning. The back pain has never gone away, its always been a dull pain    Pertinent History L fem-pop bypass graft, Lumbar fusion, R THR,    How long can you sit comfortably? no issues    How long can you stand comfortably? 15 minutes  How long can you walk comfortably? can walk longer community distances (like [employer]) but does have to stop    Patient Stated Goals Build leg strength and mobility    Currently in Pain? Yes    Pain Score 6     Pain Location Back    Pain Orientation Right;Left    Pain Descriptors / Indicators Discomfort    Pain Type Chronic pain    Pain Radiating Towards none    Pain Onset More than a month ago    Pain Frequency Constant    Aggravating Factors  just always present    Pain Relieving Factors nothing    Effect of Pain on Daily Activities varies                [facility] PT Assessment - 12/30/20 0001       Assessment   Medical Diagnosis  physical deconditioning    Referring Provider (PT) Dr. Ranell Patrick    Onset Date/Surgical Date --   chronic   Next MD Visit Dr. Ranell Patrick mid December      Precautions   Precautions None      Restrictions   Weight Bearing Restrictions No      Balance Screen   Has the patient fallen in the past 6 months Yes    How many times? down the steps    Has the patient had a decrease in activity level because of a fear of falling?  No    Is the patient reluctant to leave their home because of a fear of falling?  No      Home Ecologist residence      Prior Function   Level of Independence Independent;Independent with basic ADLs    Vocation Retired      Investment banker, corporate Control Postural limitations    Postural Limitations Rounded Shoulders;Forward head;Increased thoracic kyphosis;Decreased lumbar lordosis      AROM   AROM Assessment Site Lumbar    Lumbar Flexion fingertips 18 inches from floor    Lumbar Extension severe limitation- most motion comes T-spine    Lumbar - Right Side Bend 2 inches above midline of knee    Lumbar - Left Side Bend 2 inches above midline of knee      Strength   Right Hip Flexion 4-/5    Right Hip Extension 3/5    Right Hip ABduction 4/5    Left Hip Flexion 4/5    Left Hip Extension 3/5    Left Hip ABduction 4/5    Right Knee Flexion 3+/5    Right Knee Extension 4+/5    Left Knee Flexion 3+/5    Left Knee Extension 4+/5    Right Ankle Dorsiflexion 5/5    Left Ankle Dorsiflexion 5/5      Flexibility   Soft Tissue Assessment /Muscle Length yes    Hamstrings moderate limitation R>L    Piriformis moderate limitation R>L                           [facility] Adult PT Treatment/Exercise - 12/30/20 0001       Knee/Hip Exercises: Stretches   Piriformis Stretch Both;3 reps;30 seconds    Piriformis Stretch Limitations figure 4    Other Knee/Hip Stretches DKTC 5x10 second holds; lumbar  rotation stretch 5x10 second holds B    Other Knee/Hip Stretches QL stretch 3x30 seconds, lumbar rotation stretch 5x10 seconds B  Knee/Hip Exercises: Standing   Wall Squat 1 set;15 reps                     PT Education - 12/30/20 0839     Education Details reassessment findings and POC moving forward    Person(s) Educated Patient    Methods Explanation;Demonstration    Comprehension Verbalized understanding;Returned demonstration              PT Short Term Goals - 12/30/20 0814       PT SHORT TERM GOAL #1   Title independent with initial HEP    Time 2    Period Weeks    Status Achieved               PT Long Term Goals - 12/30/20 8563       PT LONG TERM GOAL #1   Title Increase strength in BLE to 4+/5    Time 6    Period Weeks    Status Partially Met      PT LONG TERM GOAL #2   Title Patient will be able to walk > 1000' without stopping due to pain, I, no AD, on level and unlevel surfaces.    Baseline has to stop multiple times due to "my legs going dead"- kind of like fatigue. No pain.    Time 6    Period Weeks    Status Partially Met      PT LONG TERM GOAL #3   Title Increase flexibility in spine and hips to allow patient to cross either leg over the other to don shoes.    Baseline L yes, needs to help R up but he can get there    Time 6    Period Weeks    Status Partially Met      PT LONG TERM GOAL #4   Title Climb up and down at least 12 stairs without handrail, step over step, I.    Baseline can go up without rail, much more careful going down due to hx of fall in the past    Time 6    Period Weeks    Status Partially Met      PT LONG TERM GOAL #5   Title incresare SLR on the right to 70 degrees    Baseline 45 degrees B due to stiffness    Time 6    Period Weeks    Status On-going                   Plan - 12/30/20 0840     Clinical Impression Statement Mr. Matters arrives today reporting he has felt much more  sore the past few days, his pain comes and goes but the past few days have been a bit rough. Performed re-assessment this morning- does seem to making slow but steady progress towards his goals, will definitely benefit from continuation of skilled PT services and he is in agreement with this plan. Otherwise spent the morning working on functional stretching and mobility work to try to reduce pain and stiffness. Will hopefully be feeling well enough to return to more aggressive strengthening program next session.    Personal Factors and Comorbidities Age;Past/Current Experience;Comorbidity 2    Comorbidities arthritis, PVD    Examination-Activity Limitations Bathing;Locomotion Level;Bend;Sleep;Squat;Dressing;Stand    Examination-Participation Restrictions Driving;Community Activity    Stability/Clinical Decision Making Evolving/Moderate complexity    Clinical Decision Making Moderate    Rehab Potential Good  PT Frequency 2x / week    PT Duration 4 weeks    PT Treatment/Interventions ADLs/Self Care Home Management;Iontophoresis 58m/ml Dexamethasone;Gait training;Stair training;Functional mobility training;Therapeutic activities;Therapeutic exercise;Moist Heat;Balance training;Neuromuscular re-education;Manual techniques;Dry needling;Vasopneumatic Device;Patient/family education;Electrical Stimulation    PT Next Visit Plan Progressive strength and flexibility program, balance training. Continue to encourage HEP compliance    PT Home Exercise Plan Access Code: [email or web address]   Consulted and Agree with Plan of Care Patient             Patient will benefit from skilled therapeutic intervention in order to improve the following deficits and impairments:  Abnormal gait, Difficulty walking, Increased fascial restricitons, Decreased endurance, Increased muscle spasms, Decreased activity tolerance, Pain, Decreased balance, Impaired flexibility, Decreased strength, Postural dysfunction  Visit  Diagnosis: Muscle weakness (generalized)  Muscle spasm of back  Unsteadiness on feet  Acute right-sided low back pain with right-sided sciatica  Difficulty in walking, not elsewhere classified     Problem List Patient Active Problem List   Diagnosis Date Noted   Leukocytosis 10/11/2020   PAD (peripheral artery disease) ([place] 06/29/2020   Prediabetes 04/22/2020   Chronic kidney disease, stage 3b ([facility] 04/15/2020   Tobacco dependence due to cigarettes 04/12/2020   Lower urinary tract symptoms (LUTS) 04/12/2020   History of colon polyps 04/12/2020   Atherosclerosis of native arteries of extremity with intermittent claudication ([place] 03/01/2020   Pain in both feet 07/31/2019   Primary osteoarthritis of right knee 07/07/2019   Status post total replacement of right hip 05/25/2019   Lumbar radiculopathy 02/05/2019   KAnn LionsPT, DPT, PN2   Supplemental Physical Therapist CDubois   Pager [phone] Rehab Office [address] [address]Phone: [phone]  Fax:  [phone] Name: BAcen CraunMRN: [id number] of Birth: 109/23/1952

## 2021-01-06 ENCOUNTER — Ambulatory Visit: Payer: Medicare Other | Admitting: Physical Therapy

## 2021-01-06 ENCOUNTER — Other Ambulatory Visit: Payer: Self-pay

## 2021-01-06 ENCOUNTER — Encounter: Payer: Self-pay | Admitting: Physical Therapy

## 2021-01-06 DIAGNOSIS — R262 Difficulty in walking, not elsewhere classified: Secondary | ICD-10-CM

## 2021-01-06 DIAGNOSIS — M6281 Muscle weakness (generalized): Secondary | ICD-10-CM

## 2021-01-06 DIAGNOSIS — R2681 Unsteadiness on feet: Secondary | ICD-10-CM

## 2021-01-06 DIAGNOSIS — M62830 Muscle spasm of back: Secondary | ICD-10-CM

## 2021-01-06 DIAGNOSIS — M5441 Lumbago with sciatica, right side: Secondary | ICD-10-CM

## 2021-01-06 NOTE — Therapy (Signed)
[place]. [place], [place], [address] Phone: [phone]   Fax:  [phone]  Physical Therapy Treatment  Patient Details  Name: Gerald Carpenter MRN: [id number] Date of Birth: [birth year] Referring Provider (PT): Dr. Ranell Patrick   Encounter Date: 01/06/2021   PT End of Session - 01/06/21 0836     Visit Number 11    Date for PT Re-Evaluation 02/17/21    PT Start Time 0758    PT Stop Time 0838    PT Time Calculation (min) 40 min    Activity Tolerance Patient tolerated treatment well    Behavior During Therapy [facility] for tasks assessed/performed             Past Medical History:  Diagnosis Date   Arthritis    Complication of anesthesia    Family history of adverse reaction to anesthesia    daughter- N/V   Nephritis    when pt. was 7 yrs. ols   Peripheral vascular disease (HCC)    PONV (postoperative nausea and vomiting)    "Only when I have aortagram on 06/23/20".   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    Past Surgical History:  Procedure Laterality Date   ABDOMINAL AORTOGRAM W/LOWER EXTREMITY N/A 06/23/2020   Procedure: ABDOMINAL AORTOGRAM W/LOWER EXTREMITY;  Surgeon: [name], MD;  Location: [place] [facility];  Service: Cardiovascular;  Laterality: N/A;   ANTERIOR LAT LUMBAR FUSION Right 02/05/2019   Procedure: ATTEMPTED ANTERIOR LATERAL INTERBODY FUSION;  Surgeon: Consuella Lose, MD;  Location: [place];  Service: Neurosurgery;  Laterality: Right;  RIGHT LATERAL TRANSPSOAS DISCECTOMY AND FUSION WITH ROBOTIC ASSISTED PEDICLE SCREW STABILIZATION, LUMBAR 4- LUMBAR 5   COLONOSCOPY     ENDOVEIN HARVEST OF GREATER SAPHENOUS VEIN Left 06/29/2020   Procedure: HARVEST OF LEFT GREATER SAPHENOUS VEIN;  Surgeon: [name], MD;  Location: [place];  Service: Vascular;  Laterality: Left;   EYE SURGERY Bilateral 2013   lens implants for cataracts after Lasik   FEMORAL-POPLITEAL BYPASS GRAFT  Left 06/29/2020   Procedure: BYPASS GRAFT LEFT COMMON FEMORAL TO ABOVE KNEE POPLITEAL ARTERY;  Surgeon: [name], MD;  Location: [place];  Service: Vascular;  Laterality: Left;   KNEE ARTHROSCOPY Right 1980   TONSILLECTOMY     at a very young age, but grew back   TOTAL HIP ARTHROPLASTY Right 05/25/2019   Procedure: RIGHT TOTAL HIP ARTHROPLASTY ANTERIOR APPROACH;  Surgeon: Leandrew Koyanagi, MD;  Location: [place];  Service: Orthopedics;  Laterality: Right;    There were no vitals filed for this visit.   Subjective Assessment - 01/06/21 0802     Subjective All right just a little stiff. [place] weather does not help    Currently in Pain? Yes    Pain Score 5     Pain Location Back                               [facility] Adult PT Treatment/Exercise - 01/06/21 0001       Knee/Hip Exercises: Aerobic   Nustep L3 x6 LE only      Knee/Hip Exercises: Machines for Strengthening   Cybex Knee Extension 10lb 2x10    Cybex Knee Flexion 35lb x15 then x9      Knee/Hip Exercises: Standing   Heel Raises Both;2 sets;10 reps;2 seconds    Lateral Step Up Both;1 set;10 reps;Hand Hold: 0;Step Height: 6"  Forward Step Up Both;1 set;10 reps;Hand Hold: 0;Step Height: 6"    Walking with Sports Cord 30lb 4 way x 3 each      Knee/Hip Exercises: Seated   Sit to Sand 2 sets;10 reps;without UE support   holding yellow ball                      PT Short Term Goals - 12/30/20 0814       PT SHORT TERM GOAL #1   Title independent with initial HEP    Time 2    Period Weeks    Status Achieved               PT Long Term Goals - 01/06/21 1610       PT LONG TERM GOAL #1   Title Increase strength in BLE to 4+/5    Status Partially Met      PT LONG TERM GOAL #2   Title Patient will be able to walk > 1000' without stopping due to pain, I, no AD, on level and unlevel surfaces.    Status Partially Met                   Plan - 01/06/21 0836     Clinical  Impression Statement Pt continues to report issues with his endurance walking. More active treatment session today. Some instability noted with resisted side step and backwards walking. Pt unable to complete the second set of hamstring curls due to fatigue. Some lateral shift to his R sitting with sit to stands.    Personal Factors and Comorbidities Age;Past/Current Experience;Comorbidity 2    Examination-Activity Limitations Bathing;Locomotion Level;Bend;Sleep;Squat;Dressing;Stand    Examination-Participation Restrictions Driving;Community Activity    PT Frequency 2x / week    PT Duration 4 weeks    PT Treatment/Interventions ADLs/Self Care Home Management;Iontophoresis 37m/ml Dexamethasone;Gait training;Stair training;Functional mobility training;Therapeutic activities;Therapeutic exercise;Moist Heat;Balance training;Neuromuscular re-education;Manual techniques;Dry needling;Vasopneumatic Device;Patient/family education;Electrical Stimulation    PT Next Visit Plan Progressive strength and flexibility program, balance training. Continue to encourage HEP compliance             Patient will benefit from skilled therapeutic intervention in order to improve the following deficits and impairments:  Abnormal gait, Difficulty walking, Increased fascial restricitons, Decreased endurance, Increased muscle spasms, Decreased activity tolerance, Pain, Decreased balance, Impaired flexibility, Decreased strength, Postural dysfunction  Visit Diagnosis: Muscle weakness (generalized)  Muscle spasm of back  Unsteadiness on feet  Acute right-sided low back pain with right-sided sciatica  Difficulty in walking, not elsewhere classified     Problem List Patient Active Problem List   Diagnosis Date Noted   Leukocytosis 10/11/2020   PAD (peripheral artery disease) ([place] 06/29/2020   Prediabetes 04/22/2020   Chronic kidney disease, stage 3b ([facility] [place] 04/15/2020   Tobacco dependence due to cigarettes  04/12/2020   Lower urinary tract symptoms (LUTS) 04/12/2020   History of colon polyps 04/12/2020   Atherosclerosis of native arteries of extremity with intermittent claudication ([place] 03/01/2020   Pain in both feet 07/31/2019   Primary osteoarthritis of right knee 07/07/2019   Status post total replacement of right hip 05/25/2019   Lumbar radiculopathy 02/05/2019    RScot Jun PTA 01/06/2021, 8:40 AM  [place] [address]Phone: [phone]  Fax:  [phone] Name: BTraver MeckesMRN: [id number] of Birth: 103-25-1952

## 2021-01-09 ENCOUNTER — Ambulatory Visit: Payer: Medicare Other | Admitting: Physical Therapy

## 2021-01-09 ENCOUNTER — Encounter: Payer: Self-pay | Admitting: Physical Therapy

## 2021-01-09 ENCOUNTER — Other Ambulatory Visit: Payer: Self-pay

## 2021-01-09 DIAGNOSIS — M6281 Muscle weakness (generalized): Secondary | ICD-10-CM | POA: Diagnosis not present

## 2021-01-09 DIAGNOSIS — M62830 Muscle spasm of back: Secondary | ICD-10-CM

## 2021-01-09 DIAGNOSIS — R262 Difficulty in walking, not elsewhere classified: Secondary | ICD-10-CM

## 2021-01-09 DIAGNOSIS — M5441 Lumbago with sciatica, right side: Secondary | ICD-10-CM

## 2021-01-09 DIAGNOSIS — R2681 Unsteadiness on feet: Secondary | ICD-10-CM

## 2021-01-09 NOTE — Therapy (Signed)
[place] [place], [place], [address] Phone: [phone]   Fax:  [phone]  Physical Therapy Treatment  Patient Details  Name: Gerald Carpenter MRN: [id number] Date of Birth: [birth year] Referring Provider (PT): Dr. Ranell Patrick   Encounter Date: 01/09/2021   PT End of Session - 01/09/21 0834     Visit Number 12    Date for PT Re-Evaluation 02/17/21    PT Start Time 0750    PT Stop Time 0835    PT Time Calculation (min) 45 min    Activity Tolerance Patient tolerated treatment well    Behavior During Therapy [facility] for tasks assessed/performed             Past Medical History:  Diagnosis Date   Arthritis    Complication of anesthesia    Family history of adverse reaction to anesthesia    daughter- N/V   Nephritis    when pt. was 7 yrs. ols   Peripheral vascular disease (HCC)    PONV (postoperative nausea and vomiting)    "Only when I have aortagram on 06/23/20".   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    Past Surgical History:  Procedure Laterality Date   ABDOMINAL AORTOGRAM W/LOWER EXTREMITY N/A 06/23/2020   Procedure: ABDOMINAL AORTOGRAM W/LOWER EXTREMITY;  Surgeon: [name], MD;  Location: [place] [facility];  Service: Cardiovascular;  Laterality: N/A;   ANTERIOR LAT LUMBAR FUSION Right 02/05/2019   Procedure: ATTEMPTED ANTERIOR LATERAL INTERBODY FUSION;  Surgeon: Consuella Lose, MD;  Location: [place];  Service: Neurosurgery;  Laterality: Right;  RIGHT LATERAL TRANSPSOAS DISCECTOMY AND FUSION WITH ROBOTIC ASSISTED PEDICLE SCREW STABILIZATION, LUMBAR 4- LUMBAR 5   COLONOSCOPY     ENDOVEIN HARVEST OF GREATER SAPHENOUS VEIN Left 06/29/2020   Procedure: HARVEST OF LEFT GREATER SAPHENOUS VEIN;  Surgeon: [name], MD;  Location: [place];  Service: Vascular;  Laterality: Left;   EYE SURGERY Bilateral 2013   lens implants for cataracts after Lasik   FEMORAL-POPLITEAL BYPASS GRAFT  Left 06/29/2020   Procedure: BYPASS GRAFT LEFT COMMON FEMORAL TO ABOVE KNEE POPLITEAL ARTERY;  Surgeon: [name], MD;  Location: [place];  Service: Vascular;  Laterality: Left;   KNEE ARTHROSCOPY Right 1980   TONSILLECTOMY     at a very young age, but grew back   TOTAL HIP ARTHROPLASTY Right 05/25/2019   Procedure: RIGHT TOTAL HIP ARTHROPLASTY ANTERIOR APPROACH;  Surgeon: Leandrew Koyanagi, MD;  Location: [place];  Service: Orthopedics;  Laterality: Right;    There were no vitals filed for this visit.   Subjective Assessment - 01/09/21 0759     Subjective always in pain, cold weather don't help.    Pertinent History L fem-pop bypass graft, Lumbar fusion, R THR,    Currently in Pain? Yes    Pain Score 5     Pain Location Back                               [facility] Adult PT Treatment/Exercise - 01/09/21 0001       Knee/Hip Exercises: Aerobic   Nustep L3 x6 LE only      Knee/Hip Exercises: Machines for Strengthening   Cybex Knee Extension 10lb 2x10    Cybex Knee Flexion 35lb 2x12    Cybex Leg Press 40lb 2x12, SL 20lb x10 each      Knee/Hip Exercises: Standing   Heel  Raises Both;2 sets;10 reps;2 seconds    Lateral Step Up Both;1 set;Hand Hold: 0;Step Height: 6";15 reps    Forward Step Up Both;10 reps;Hand Hold: 0;2 sets;Step Height: 8"    Other Standing Knee Exercises hip abd & Ext 5lb x10 each                       PT Short Term Goals - 12/30/20 0814       PT SHORT TERM GOAL #1   Title independent with initial HEP    Time 2    Period Weeks    Status Achieved               PT Long Term Goals - 01/06/21 6004       PT LONG TERM GOAL #1   Title Increase strength in BLE to 4+/5    Status Partially Met      PT LONG TERM GOAL #2   Title Patient will be able to walk > 1000' without stopping due to pain, I, no AD, on level and unlevel surfaces.    Status Partially Met                   Plan - 01/09/21 0836     Clinical  Impression Statement Increase joint pain reported with the cold weather, but gives good effort throughout session. increase reps tolerated with functional interventions. Pt also did well with SL strengthening on leg press. Hip fatigue and weakness noted with hip abduction and extensions.    Personal Factors and Comorbidities Age;Past/Current Experience;Comorbidity 2    Examination-Activity Limitations Bathing;Locomotion Level;Bend;Sleep;Squat;Dressing;Stand    Examination-Participation Restrictions Driving;Community Activity    Rehab Potential Good    PT Duration 4 weeks    PT Treatment/Interventions ADLs/Self Care Home Management;Iontophoresis 35m/ml Dexamethasone;Gait training;Stair training;Functional mobility training;Therapeutic activities;Therapeutic exercise;Moist Heat;Balance training;Neuromuscular re-education;Manual techniques;Dry needling;Vasopneumatic Device;Patient/family education;Electrical Stimulation    PT Next Visit Plan Progressive strength and flexibility program, balance training.             Patient will benefit from skilled therapeutic intervention in order to improve the following deficits and impairments:  Abnormal gait, Difficulty walking, Increased fascial restricitons, Decreased endurance, Increased muscle spasms, Decreased activity tolerance, Pain, Decreased balance, Impaired flexibility, Decreased strength, Postural dysfunction  Visit Diagnosis: Muscle weakness (generalized)  Muscle spasm of back  Acute right-sided low back pain with right-sided sciatica  Difficulty in walking, not elsewhere classified  Unsteadiness on feet     Problem List Patient Active Problem List   Diagnosis Date Noted   Leukocytosis 10/11/2020   PAD (peripheral artery disease) ([place] 06/29/2020   Prediabetes 04/22/2020   Chronic kidney disease, stage 3b ([place] 04/15/2020   Tobacco dependence due to cigarettes 04/12/2020   Lower urinary tract symptoms (LUTS) 04/12/2020    History of colon polyps 04/12/2020   Atherosclerosis of native arteries of extremity with intermittent claudication ([place] 03/01/2020   Pain in both feet 07/31/2019   Primary osteoarthritis of right knee 07/07/2019   Status post total replacement of right hip 05/25/2019   Lumbar radiculopathy 02/05/2019    RScot Jun PTA 01/09/2021, 8:40 AM  [place] [address]Phone: [phone]  Fax:  [phone] Name: BMarquavis HannenMRN: [id number] of Birth: 1August 23, 1952

## 2021-01-10 ENCOUNTER — Other Ambulatory Visit: Payer: Self-pay

## 2021-01-11 ENCOUNTER — Ambulatory Visit (INDEPENDENT_AMBULATORY_CARE_PROVIDER_SITE_OTHER): Payer: Medicare Other | Admitting: Family Medicine

## 2021-01-11 ENCOUNTER — Encounter: Payer: Self-pay | Admitting: Family Medicine

## 2021-01-11 VITALS — BP 116/70 | HR 77 | Temp 97.9°F | Ht 68.0 in | Wt 153.0 lb

## 2021-01-11 DIAGNOSIS — R399 Unspecified symptoms and signs involving the genitourinary system: Secondary | ICD-10-CM

## 2021-01-11 DIAGNOSIS — M5416 Radiculopathy, lumbar region: Secondary | ICD-10-CM | POA: Diagnosis not present

## 2021-01-11 DIAGNOSIS — E785 Hyperlipidemia, unspecified: Secondary | ICD-10-CM

## 2021-01-11 DIAGNOSIS — I739 Peripheral vascular disease, unspecified: Secondary | ICD-10-CM

## 2021-01-11 DIAGNOSIS — F17210 Nicotine dependence, cigarettes, uncomplicated: Secondary | ICD-10-CM

## 2021-01-11 DIAGNOSIS — K649 Unspecified hemorrhoids: Secondary | ICD-10-CM

## 2021-01-11 LAB — LIPID PANEL
Cholesterol: 87 mg/dL (ref 0–200)
HDL: 21.8 mg/dL — ABNORMAL LOW (ref >39.00)
LDL Cholesterol: 54 mg/dL (ref 0–99)
NonHDL: 65.56
Total CHOL/HDL Ratio: 4
Triglycerides: 57 mg/dL (ref 0.0–149.0)
VLDL: 11.4 mg/dL (ref 0.0–40.0)

## 2021-01-11 NOTE — Progress Notes (Signed)
[facility] [address] [place] [address] Dept: [phone] Dept Fax: [phone]  Chronic Care Office Visit  Subjective:    Patient ID: Gerald Carpenter, male    DOB: 1950-05-05, 70 y.o..   MRN: [id number]  Chief Complaint  Patient presents with   Follow-up    3 month f/u,   no concerns. Declines flu shot.     History of Present Illness:  Patient is in today for reassessment of chronic medical issues.  Gerald Carpenter has a history of claudication due to peripheral artery disease. He underwent a left femoral to above knee popliteal artery bypass in May. He is being managed medically with aspirin and statins currently. The combination of PAD and lumbar disc disease has left him with some weakness and imbalance. He was referred to physical medicine and has been going through physical therapy. He does feel her has made improvements and can walk further than before.  Gerald Carpenter continues to smoke 0-6 cigarettes per day. Dr. Ranell Patrick had prescribed him naltrexone, but he noted issues with dizziness associated with its use. He is using some nicotine lozenges currently and finds this has helped some. He has used nicotine patches int he past with variable results.  Gerald Carpenter has a history of LUTS. He is on Flomax. He notes that this has improved his stream and he is only getting up to urinate once a night.  Gerald Carpenter has a history of having a hemorrhoid for many years. He notes it has become more difficult to clean his rectal area after defecation. He would like to see about a hemorrhoidectomy.   Past Medical History: Patient Active Problem List   Diagnosis Date Noted   Leukocytosis 10/11/2020   PAD (peripheral artery disease) ([place]) 06/29/2020   Prediabetes 04/22/2020   Chronic kidney disease, stage 3b ([place]) 04/15/2020   Tobacco dependence due to cigarettes 04/12/2020   Lower urinary tract symptoms (LUTS) 04/12/2020   History of  colon polyps 04/12/2020   Atherosclerosis of native arteries of extremity with intermittent claudication ([place]) 03/01/2020   Pain in both feet 07/31/2019   Primary osteoarthritis of right knee 07/07/2019   Status post total replacement of right hip 05/25/2019   Lumbar radiculopathy 02/05/2019   Past Surgical History:  Procedure Laterality Date   ABDOMINAL AORTOGRAM W/LOWER EXTREMITY N/A 06/23/2020   Procedure: ABDOMINAL AORTOGRAM W/LOWER EXTREMITY;  Surgeon: [name], MD;  Location: [facility];  Service: Cardiovascular;  Laterality: N/A;   ANTERIOR LAT LUMBAR FUSION Right 02/05/2019   Procedure: ATTEMPTED ANTERIOR LATERAL INTERBODY FUSION;  Surgeon: Consuella Lose, MD;  Location: [facility];  Service: Neurosurgery;  Laterality: Right;  RIGHT LATERAL TRANSPSOAS DISCECTOMY AND FUSION WITH ROBOTIC ASSISTED PEDICLE SCREW STABILIZATION, LUMBAR 4- LUMBAR 5   COLONOSCOPY     ENDOVEIN HARVEST OF GREATER SAPHENOUS VEIN Left 06/29/2020   Procedure: HARVEST OF LEFT GREATER SAPHENOUS VEIN;  Surgeon: [name], MD;  Location: [facility];  Service: Vascular;  Laterality: Left;   EYE SURGERY Bilateral 2013   lens implants for cataracts after Lasik   FEMORAL-POPLITEAL BYPASS GRAFT Left 06/29/2020   Procedure: BYPASS GRAFT LEFT COMMON FEMORAL TO ABOVE KNEE POPLITEAL ARTERY;  Surgeon: [name], MD;  Location: [facility];  Service: Vascular;  Laterality: Left;   KNEE ARTHROSCOPY Right 1980   TONSILLECTOMY     at a very young age, but grew back   [place] Right 05/25/2019   Procedure: RIGHT TOTAL  HIP ARTHROPLASTY ANTERIOR APPROACH;  Surgeon: Leandrew Koyanagi, MD;  Location: [facility];  Service: Orthopedics;  Laterality: Right;   Family History  Problem Relation Age of Onset   Pulmonary fibrosis Mother    Heart disease Father    Outpatient Medications Prior to Visit  Medication Sig Dispense Refill   aspirin EC 81 MG tablet Take 1 tablet (81 mg total) by mouth daily.  Swallow whole. 150 tablet 2   atorvastatin (LIPITOR) 40 MG tablet Take 1 tablet (40 mg total) by mouth daily. 90 tablet 3   fluticasone (FLONASE) 50 MCG/ACT nasal spray Place 2 sprays into both nostrils in the morning.     naltrexone (DEPADE) 50 MG tablet Take 1 tablet (50 mg total) by mouth daily. 90 tablet 3   tamsulosin (FLOMAX) 0.4 MG CAPS capsule Take 1 capsule (0.4 mg total) by mouth in the morning. 90 capsule 3   No facility-administered medications prior to visit.   Allergies  Allergen Reactions   Anesthetics, Amide Nausea And Vomiting    Objective:   Today's Vitals   01/11/21 0757  BP: 116/70  Pulse: 77  Temp: 97.9 F (36.6 C)  TempSrc: Temporal  SpO2: 97%  Weight: 153 lb (69.4 kg)  Height: 5\' 8"  (1.727 m)   Body mass index is 23.26 kg/m.   General: Well developed, well nourished. No acute distress. Psych: Alert and oriented. Normal mood and affect.  Health Maintenance Due  Topic Date Due   Zoster Vaccines- Shingrix (1 of 2) Never done   COVID-19 Vaccine (3 - Booster for Pfizer series) 06/30/2019   Pneumonia Vaccine 43+ Years old (2 - PPSV23 if available, else PCV20) 04/12/2021   Lab Results CMP Latest Ref Rng & Units 09/18/2020 07/11/2020 06/30/2020  Glucose 70 - 99 mg/dL 106(H) 138(H) 103(H)  BUN 8 - 23 mg/dL 19 26(H) 18  Creatinine 0.61 - 1.24 mg/dL 1.65(H) 1.34 1.16  Sodium 135 - 145 mmol/L 138 137 138  Potassium 3.5 - 5.1 mmol/L 4.1 4.3 3.4(L)  Chloride 98 - 111 mmol/L 107 103 114(H)  CO2 22 - 32 mmol/L 23 23 19(L)  Calcium 8.9 - 10.3 mg/dL 8.7(L) 8.7 6.7(L)  Total Protein 6.5 - 8.1 g/dL 6.6 - -  Total Bilirubin 0.3 - 1.2 mg/dL 0.2(L) - -  Alkaline Phos 38 - 126 U/L 51 - -  AST 15 - 41 U/L 19 - -  ALT 0 - 44 U/L 19 - -   CBC Latest Ref Rng & Units 09/18/2020 07/11/2020 06/30/2020  WBC 4.0 - 10.5 K/uL 13.8(H) 16.6(H) 18.4(H)  Hemoglobin 13.0 - 17.0 g/dL 15.7 13.8 12.5(L)  Hematocrit 39.0 - 52.0 % 46.5 41.4 37.7(L)  Platelets 150 - 400 K/uL 215 221.0 153      Assessment & Plan:   1. PAD (peripheral artery disease) ([place]) 2. Dyslipidemia Continue to manage medically. Rehab is helping improve strength and balance. We will recheck lipids today. Continue aspirin and statin.  - Lipid panel  3. Tobacco dependence due to cigarettes I repeated my advice to quit smoking. We discussed how it is never too late to quit. I recommended an approach to further reducing his cigarettes by decreasing one cigarette a day for a period until ready to take the next step.  4. Lumbar radiculopathy In physical therapy for now.  5. Lower urinary tract symptoms (LUTS) Improved with tamsulosin.  6. Hemorrhoids, unspecified hemorrhoid type I will refer him to discuss potential hemorrhoidectomy with a surgeon.  - Ambulatory referral to  General Surgery  Haydee Salter, MD

## 2021-01-19 ENCOUNTER — Other Ambulatory Visit: Payer: Self-pay

## 2021-01-19 ENCOUNTER — Ambulatory Visit: Payer: Medicare Other | Attending: Physical Medicine and Rehabilitation | Admitting: Physical Therapy

## 2021-01-19 ENCOUNTER — Encounter: Payer: Self-pay | Admitting: Physical Therapy

## 2021-01-19 DIAGNOSIS — R2681 Unsteadiness on feet: Secondary | ICD-10-CM | POA: Diagnosis present

## 2021-01-19 DIAGNOSIS — M6281 Muscle weakness (generalized): Secondary | ICD-10-CM | POA: Insufficient documentation

## 2021-01-19 DIAGNOSIS — M62830 Muscle spasm of back: Secondary | ICD-10-CM | POA: Insufficient documentation

## 2021-01-19 DIAGNOSIS — M5441 Lumbago with sciatica, right side: Secondary | ICD-10-CM | POA: Diagnosis present

## 2021-01-19 DIAGNOSIS — R262 Difficulty in walking, not elsewhere classified: Secondary | ICD-10-CM | POA: Diagnosis present

## 2021-01-19 NOTE — Therapy (Signed)
[facility] [place], [place], [address] Phone: [phone]   Fax:  [phone]  Physical Therapy Treatment  Patient Details  Name: Gerald Carpenter MRN: [id number] Date of Birth: April 17, 1950 Referring Provider (PT): Dr. Ranell Patrick   Encounter Date: 01/19/2021   PT End of Session - 01/19/21 0840     Visit Number 13    Date for PT Re-Evaluation 02/17/21    PT Start Time 0759    PT Stop Time 0840    PT Time Calculation (min) 41 min    Activity Tolerance Patient tolerated treatment well    Behavior During Therapy [facility] for tasks assessed/performed             Past Medical History:  Diagnosis Date   Arthritis    Complication of anesthesia    Family history of adverse reaction to anesthesia    daughter- N/V   Nephritis    when pt. was 7 yrs. ols   Peripheral vascular disease (H[facility])    PONV (postoperative nausea and vomiting)    "Only when I have aortagram on 06/23/20".   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    Past Surgical History:  Procedure Laterality Date   ABDOMINAL AORTOGRAM W/LOWER EXTREMITY N/A 06/23/2020   Procedure: ABDOMINAL AORTOGRAM W/LOWER EXTREMITY;  Surgeon: [name], MD;  Location: [facility];  Service: Cardiovascular;  Laterality: N/A;   ANTERIOR LAT LUMBAR FUSION Right 02/05/2019   Procedure: ATTEMPTED ANTERIOR LATERAL INTERBODY FUSION;  Surgeon: Consuella Lose, MD;  Location: [facility];  Service: Neurosurgery;  Laterality: Right;  RIGHT LATERAL TRANSPSOAS DISCECTOMY AND FUSION WITH ROBOTIC ASSISTED PEDICLE SCREW STABILIZATION, LUMBAR 4- LUMBAR 5   COLONOSCOPY     ENDOVEIN HARVEST OF GREATER SAPHENOUS VEIN Left 06/29/2020   Procedure: HARVEST OF LEFT GREATER SAPHENOUS VEIN;  Surgeon: [name], MD;  Location: [facility];  Service: Vascular;  Laterality: Left;   EYE SURGERY Bilateral 2013   lens implants for cataracts after Lasik   FEMORAL-POPLITEAL BYPASS GRAFT  Left 06/29/2020   Procedure: BYPASS GRAFT LEFT COMMON FEMORAL TO ABOVE KNEE POPLITEAL ARTERY;  Surgeon: [name], MD;  Location: [facility];  Service: Vascular;  Laterality: Left;   KNEE ARTHROSCOPY Right 1980   TONSILLECTOMY     at a very young age, but grew back   TOTAL HIP ARTHROPLASTY Right 05/25/2019   Procedure: RIGHT TOTAL HIP ARTHROPLASTY ANTERIOR APPROACH;  Surgeon: Leandrew Koyanagi, MD;  Location: [facility];  Service: Orthopedics;  Laterality: Right;    There were no vitals filed for this visit.   Subjective Assessment - 01/19/21 0810     Subjective Same old, pain in the R knee and low back. Pain MD apt 16    Currently in Pain? Yes    Pain Score 6     Pain Location Back                               [facility] Adult PT Treatment/Exercise - 01/19/21 0001       Ambulation/Gait   Stairs Yes    Stairs Assistance 6: Modified independent (Device/Increase time)    Stair Management Technique No rails;Alternating pattern    Number of Stairs 12    Height of Stairs 6      Knee/Hip Exercises: Aerobic   Nustep L5 x6 LE only      Knee/Hip Exercises: Machines for Strengthening  Cybex Knee Extension 10lb 2x15    Cybex Knee Flexion 35lb 2x15    Cybex Leg Press 50lb 2x15      Knee/Hip Exercises: Standing   Heel Raises Both;2 sets;15 reps;2 seconds    Lateral Step Up Both;1 set;Hand Hold: 0;Step Height: 6";15 reps    Forward Step Up 1 set;Both;5 reps;Hand Hold: 0;Step Height: 8"    Walking with Sports Cord 40lb side step x8 each      Knee/Hip Exercises: Seated   Sit to Sand 2 sets;without UE support;15 reps   LE on airex                      PT Short Term Goals - 12/30/20 6599       PT SHORT TERM GOAL #1   Title independent with initial HEP    Time 2    Period Weeks    Status Achieved               PT Long Term Goals - 01/19/21 0813       PT LONG TERM GOAL #1   Title Increase strength in BLE to 4+/5    Status Partially Met       PT LONG TERM GOAL #2   Title Patient will be able to walk > 1000' without stopping due to pain, I, no AD, on level and unlevel surfaces.      PT LONG TERM GOAL #3   Title Increase flexibility in spine and hips to allow patient to cross either leg over the other to don shoes.    Status Partially Met      PT LONG TERM GOAL #4   Title Climb up and down at least 12 stairs without handrail, step over step, I.    Status Achieved                   Plan - 01/19/21 0840     Clinical Impression Statement Pt has progressed meeting stair goal maintaining alt pattern without rail. He continues to reports low back and knee pain. He reports an upcoming appointment with pain specialist. Some difficulty with sit to stands on airex but able to complete. Rest needed during sets of Hs curls due to muscle fatigue.    Personal Factors and Comorbidities Age;Past/Current Experience;Comorbidity 2    Examination-Activity Limitations Bathing;Locomotion Level;Bend;Sleep;Squat;Dressing;Stand    Examination-Participation Restrictions Driving;Community Activity    PT Treatment/Interventions ADLs/Self Care Home Management;Iontophoresis 54m/ml Dexamethasone;Gait training;Stair training;Functional mobility training;Therapeutic activities;Therapeutic exercise;Moist Heat;Balance training;Neuromuscular re-education;Manual techniques;Dry needling;Vasopneumatic Device;Patient/family education;Electrical Stimulation    PT Next Visit Plan Progressive strength and flexibility program, balance training.             Patient will benefit from skilled therapeutic intervention in order to improve the following deficits and impairments:  Abnormal gait, Difficulty walking, Increased fascial restricitons, Decreased endurance, Increased muscle spasms, Decreased activity tolerance, Pain, Decreased balance, Impaired flexibility, Decreased strength, Postural dysfunction  Visit Diagnosis: Muscle weakness (generalized)  Muscle  spasm of back  Acute right-sided low back pain with right-sided sciatica  Difficulty in walking, not elsewhere classified  Unsteadiness on feet     Problem List Patient Active Problem List   Diagnosis Date Noted   Leukocytosis 10/11/2020   PAD (peripheral artery disease) ([place] 06/29/2020   Prediabetes 04/22/2020   Chronic kidney disease, stage 3b ([place] 04/15/2020   Tobacco dependence due to cigarettes 04/12/2020   Lower urinary tract symptoms (LUTS) 04/12/2020   History of colon  polyps 04/12/2020   Atherosclerosis of native arteries of extremity with intermittent claudication ([place]) 03/01/2020   Pain in both feet 07/31/2019   Primary osteoarthritis of right knee 07/07/2019   Status post total replacement of right hip 05/25/2019   Lumbar radiculopathy 02/05/2019    [name], PTA 01/19/2021, 8:43 AM  [facility] [place], [place], [address] Phone: [phone]   Fax:  [phone]  Name: Gerald Carpenter MRN: [id number] Date of Birth: 1950-12-23

## 2021-01-24 ENCOUNTER — Ambulatory Visit: Payer: Medicare Other | Admitting: Physical Therapy

## 2021-01-30 NOTE — Progress Notes (Signed)
Subjective:    Patient ID: Gerald Carpenter, male    DOB: 25-Jan-1951, 70 y.o.   MRN: [id number]  HPI Gerald Carpenter is a 70 year old man who presents to establish care for PAD with legs locking up.  -He is s/p bypass- he does not note improvements in the leg after the bypass -He has decreased sensation in his left lower leg and pain. -pain is 4/10 on average and right now. Feels intermittent, dull, aching. -He does not take any medications for pain -He is a recovering alcoholic.  -he is an active smoker -his back has been killing him, also in is hip and buttock.  -2 years of benefit form his hip and back surgeries -pain is 4-5/10 -the therapy helped his walking and flexibility.  -hamstring is very tight and his wife has been helping to stretch this out.  -he does not want any pain medication, he would rather be in pain.  -his wife is returning to the [place] on 12/29. They plan to joint the gym together.    Pain Inventory Average Pain 5 Pain Right Now 5 My pain is intermittent, sharp, burning, stabbing, and aching  In the last 24 hours, has pain interfered with the following? General activity 6 Relation with others 5 Enjoyment of life 9 What TIME of day is your pain at its worst? morning , daytime, evening, and night Sleep (in general) Fair  Pain is worse with: walking, bending, sitting, and standing Pain improves with: pacing activities Relief from Meds: 0     Family History  Problem Relation Age of Onset   Pulmonary fibrosis Mother    Heart disease Father    Social History   Socioeconomic History   Marital status: Married    Spouse name: Not on file   Number of children: Not on file   Years of education: Not on file   Highest education level: Not on file  Occupational History   Not on file  Tobacco Use   Smoking status: Some Days    Packs/day: 0.25    Years: 53.00    Pack years: 13.25    Types: Cigarettes   Smokeless tobacco: Never   Tobacco comments:     had first cigarettes age  70, habit started at age 70  Vaping Use   Vaping Use: Never used  Substance and Sexual Activity   Alcohol use: Not Currently    Comment: 05/10/1991   Drug use: Not Currently    Comment: prior usage of "speed"  when drinking   Sexual activity: Yes  Other Topics Concern   Not on file  Social History Narrative   Recently moved here from [facility], [place]   Social Determinants of Health   Financial Resource Strain: Low Risk    Difficulty of Paying Living Expenses: Not hard at all  Food Insecurity: No Food Insecurity   Worried About Charity fundraiser in the Last Year: Never true   Arboriculturist in the Last Year: Never true  Transportation Needs: No Transportation Needs   Lack of Transportation (Medical): No   Lack of Transportation (Non-Medical): No  Physical Activity: Inactive   Days of Exercise per Week: 0 days   Minutes of Exercise per Session: 0 min  Stress: No Stress Concern Present   Feeling of Stress : Not at all  Social Connections: Moderately Isolated   Frequency of Communication with Friends and Family: More than three times a week   Frequency of  Social Gatherings with Friends and Family: Once a week   Attends Religious Services: Never   Marine scientist or Organizations: No   Attends Archivist Meetings: Never   Marital Status: Married   Past Surgical History:  Procedure Laterality Date   ABDOMINAL AORTOGRAM W/LOWER EXTREMITY N/A 06/23/2020   Procedure: ABDOMINAL AORTOGRAM W/LOWER EXTREMITY;  Surgeon: [name], MD;  Location: [facility];  Service: Cardiovascular;  Laterality: N/A;   ANTERIOR LAT LUMBAR FUSION Right 02/05/2019   Procedure: ATTEMPTED ANTERIOR LATERAL INTERBODY FUSION;  Surgeon: Consuella Lose, MD;  Location: [facility];  Service: Neurosurgery;  Laterality: Right;  RIGHT LATERAL TRANSPSOAS DISCECTOMY AND FUSION WITH ROBOTIC ASSISTED PEDICLE SCREW STABILIZATION, LUMBAR 4- LUMBAR 5    COLONOSCOPY     ENDOVEIN HARVEST OF GREATER SAPHENOUS VEIN Left 06/29/2020   Procedure: HARVEST OF LEFT GREATER SAPHENOUS VEIN;  Surgeon: [name], MD;  Location: [facility];  Service: Vascular;  Laterality: Left;   EYE SURGERY Bilateral 2013   lens implants for cataracts after Lasik   FEMORAL-POPLITEAL BYPASS GRAFT Left 06/29/2020   Procedure: BYPASS GRAFT LEFT COMMON FEMORAL TO ABOVE KNEE POPLITEAL ARTERY;  Surgeon: [name], MD;  Location: [facility];  Service: Vascular;  Laterality: Left;   KNEE ARTHROSCOPY Right 1980   TONSILLECTOMY     at a very young age, but grew back   TOTAL HIP ARTHROPLASTY Right 05/25/2019   Procedure: RIGHT TOTAL HIP ARTHROPLASTY ANTERIOR APPROACH;  Surgeon: Leandrew Koyanagi, MD;  Location: [facility];  Service: Orthopedics;  Laterality: Right;   Past Medical History:  Diagnosis Date   Arthritis    Complication of anesthesia    Family history of adverse reaction to anesthesia    daughter- N/V   Nephritis    when pt. was 7 yrs. ols   Peripheral vascular disease (HCC)    PONV (postoperative nausea and vomiting)    "Only when I have aortagram on 06/23/20".   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   BP 120/74   Pulse 70   Temp 98.1 F (36.7 C) (Oral)   Ht 5\' 8"  (1.727 m)   Wt 155 lb (70.3 kg)   SpO2 98%   BMI 23.57 kg/m   Opioid Risk Score:   Fall Risk Score:  `1  Depression screen PHQ 2/9  Depression screen [facility] 2/9 11/17/2020 08/09/2020 04/12/2020  Decreased Interest 3 0 0  Down, Depressed, Hopeless 3 0 0  PHQ - 2 Score 6 0 0  Altered sleeping 1 - -  Tired, decreased energy 3 - -  Change in appetite 0 - -  Feeling bad or failure about yourself  2 - -  Trouble concentrating 0 - -  Moving slowly or fidgety/restless 0 - -  Suicidal thoughts 2 - -  PHQ-9 Score 14 - -  Difficult doing work/chores Extremely dIfficult - -     Review of Systems  Constitutional: Negative.   HENT: Negative.    Eyes: Negative.   Respiratory: Negative.     Cardiovascular: Negative.   Gastrointestinal: Negative.   Endocrine: Negative.   Genitourinary: Negative.   Musculoskeletal:        Leg numbness or heaviness  Skin: Negative.   Allergic/Immunologic: Negative.   Neurological:  Positive for weakness and numbness.       Tingling  Hematological: Negative.   Psychiatric/Behavioral:  Positive for dysphoric mood and suicidal ideas.   All other systems reviewed and are negative.  Objective:   Physical Exam  Gen: no distress, normal appearing, BP 120/74, weight 155 lbs.  HEENT: oral mucosa pink and moist, NCAT Cardio: Reg rate Chest: normal effort, normal rate of breathing Abd: soft, non-distended Ext: no edema Psych: pleasant, normal affect Skin: intact Neuro:Decreased sensation in left lower extremity.  MSK: Knee popping.      Assessment & Plan:   Gerald Carpenter is a 70 year old man who presents to establish care for lack of sensation in left leg following bypass for PAD.  1) Lack of sensation in left lower leg following bypass -Discussed Qutenza as an option for neuropathic pain control. Discussed that this is a capsaicin patch, stronger than capsaicin cream. Discussed that it is currently approved for diabetic peripheral neuropathy and post-herpetic neuralgia, but that it has also shown benefit in treating other forms of neuropathy. Provided patient with link to site to learn more about the patch: CinemaBon[employer].fr. Discussed that the patch would be placed in office and benefits usually last 3 months. Discussed that unintended exposure to capsaicin can cause severe irritation of eyes, mucous membranes, respiratory tract, and skin, but that Qutenza is a local treatment and does not have the systemic side effects of other nerve medications. Discussed that there may be pain, itching, erythema, and decreased sensory function associated with the application of Qutenza. Side effects usually subside within 1 week. A cold pack of  analgesic medications can help with these side effects. Blood pressure can also be increased due to pain associated with administration of the patch.  -discussed smoking as a risk factor.  2) Active smoker: -discussed benefits of reducing smoking.  -discussed cold [place] -discussed use of the lozenges and the patch.  -Naltrexone made him sick -his New year's resolution is to stop smoking -he quit for a year using the patch.   3) Provided with list of supplements that can help with dyslipidemia: 1) Vitamin B3 500-4,000mg  in divided doses daily (would recommend starting low as can cause uncomfortable facial flushing if started at too high a dose) 2) Phytosterols 2.15 grams daily 3) Fermented soy 30-50 grams daily 4) EGCG (found in green tea): 500-1000mg  daily 5) Omega-3 fatty acids 3000-5,000mg  daily 6) Flax seed 40 grams daily 7) Monounsaturated fats 20-40 grams daily (olives, olive oil, nuts), also reduces cardiovascular disease 8) Sesame: 40 grams daily 9) Gamma/delta tocotrienols- a family of unsaturated forms of Vitamin E- 200mg  with dinner 10) Pantethine 900mg  daily in divided doses 11) Resveratrol 250mg  daily 12) N Acetyl Cysteine 2000mg  daily in divided doses 13) Curcumin 2000-5000mg  in divided doses daily 14) Pomegranate juice: 8 ounces daily, also helps to lower blood pressure 15) Pomegranate seeds one cup daily, also helps to lower blood pressure 16) Citrus Bergamot 1000mg  daily, also helps with glucose control and weight loss 17) Vitamin C 500mg  daily 18) Quercetin 500-1000mg  daily 19) Glutathione 20) Probiotics 60-100 billion organisms per day 21) Fiber 22) Oats 23) Aged garlic (can eat as food or supplement of 600-900mg  per day) 24) Chia seeds 25 grams per day 25) Lycopene- carotenoid found in high concentrations in tomatoes. 26) Alpha linolenic acid 27) Flavonoids and anthocyanins 28) Wogonin- flavanoid that enhances reverse cholesterol transport 29) Coenzyme  Q10 30) Pantethine- derivative of Vitamin B5: 300mg  three times per day or 450mg  twice per day with or without food 31) Barley and other whole grains 32) Orange juice 33) L- carnitine 34) L- Lysine 35) L- Arginine 36) Almonds 37) Morin 38) Rutin 39) Carnosine 40) Histidine  41) Kaempferol  42) Organosulfur compounds 43) Vitamin E 44) Oleic acid 45) RBO (ferulic acid gammaoryzanol) 46) grape seed extract 47) Red wine 48) Berberine HCL 500mg  daily or twice per day- more effective and with fewer adverse effects that ezetimibe monotherapy 49) red yeast rice 2400- 4800 mg/day 50) chlorella 51) Licorice   4) Right knee pain and popping: -had good benefit from a gel injection 1.5 years ago.  -encouraged repeat viscosupplementation if pain in knee worsens -Provided with a pain relief journal and discussed that it contains foods and lifestyle tips to naturally help to improve pain. Discussed that these lifestyle strategies are also very good for health unlike some medications which can have negative side effects. Discussed that the act of keeping a journal can be therapeutic and helpful to realize patterns what helps to trigger and alleviate pain.    5) Low back pain -discussed lidocaine patch but he defers.

## 2021-01-31 ENCOUNTER — Ambulatory Visit: Payer: Medicare Other | Admitting: Physical Therapy

## 2021-01-31 ENCOUNTER — Other Ambulatory Visit: Payer: Self-pay

## 2021-01-31 ENCOUNTER — Encounter: Payer: Self-pay | Admitting: Physical Therapy

## 2021-01-31 DIAGNOSIS — M5441 Lumbago with sciatica, right side: Secondary | ICD-10-CM

## 2021-01-31 DIAGNOSIS — R262 Difficulty in walking, not elsewhere classified: Secondary | ICD-10-CM

## 2021-01-31 DIAGNOSIS — R2681 Unsteadiness on feet: Secondary | ICD-10-CM

## 2021-01-31 DIAGNOSIS — M6281 Muscle weakness (generalized): Secondary | ICD-10-CM | POA: Diagnosis not present

## 2021-01-31 DIAGNOSIS — M62830 Muscle spasm of back: Secondary | ICD-10-CM

## 2021-01-31 NOTE — Therapy (Signed)
[place] [place], [place], [address] Phone: [phone]   Fax:  [phone]  Physical Therapy Treatment  Patient Details  Name: Gerald Carpenter MRN: [id number] Date of Birth: [birth year] Referring Provider (PT): Dr. Ranell Patrick   Encounter Date: 01/31/2021   PT End of Session - 01/31/21 0846     Visit Number 14    Number of Visits 18    Date for PT Re-Evaluation 02/17/21    Progress Note Due on Visit 20    PT Start Time 0801    PT Stop Time 0841    PT Time Calculation (min) 40 min    Activity Tolerance Patient tolerated treatment well    Behavior During Therapy [facility] for tasks assessed/performed             Past Medical History:  Diagnosis Date   Arthritis    Complication of anesthesia    Family history of adverse reaction to anesthesia    daughter- N/V   Nephritis    when pt. was 7 yrs. ols   Peripheral vascular disease (HCC)    PONV (postoperative nausea and vomiting)    "Only when I have aortagram on 06/23/20".   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    Past Surgical History:  Procedure Laterality Date   ABDOMINAL AORTOGRAM W/LOWER EXTREMITY N/A 06/23/2020   Procedure: ABDOMINAL AORTOGRAM W/LOWER EXTREMITY;  Surgeon: [name], MD;  Location: [facility];  Service: Cardiovascular;  Laterality: N/A;   ANTERIOR LAT LUMBAR FUSION Right 02/05/2019   Procedure: ATTEMPTED ANTERIOR LATERAL INTERBODY FUSION;  Surgeon: Consuella Lose, MD;  Location: [facility];  Service: Neurosurgery;  Laterality: Right;  RIGHT LATERAL TRANSPSOAS DISCECTOMY AND FUSION WITH ROBOTIC ASSISTED PEDICLE SCREW STABILIZATION, LUMBAR 4- LUMBAR 5   COLONOSCOPY     ENDOVEIN HARVEST OF GREATER SAPHENOUS VEIN Left 06/29/2020   Procedure: HARVEST OF LEFT GREATER SAPHENOUS VEIN;  Surgeon: [name], MD;  Location: [facility];  Service: Vascular;  Laterality: Left;   EYE SURGERY Bilateral 2013   lens implants for  cataracts after Lasik   FEMORAL-POPLITEAL BYPASS GRAFT Left 06/29/2020   Procedure: BYPASS GRAFT LEFT COMMON FEMORAL TO ABOVE KNEE POPLITEAL ARTERY;  Surgeon: [name], MD;  Location: [facility];  Service: Vascular;  Laterality: Left;   KNEE ARTHROSCOPY Right 1980   TONSILLECTOMY     at a very young age, but grew back   TOTAL HIP ARTHROPLASTY Right 05/25/2019   Procedure: RIGHT TOTAL HIP ARTHROPLASTY ANTERIOR APPROACH;  Surgeon: Leandrew Koyanagi, MD;  Location: [facility];  Service: Orthopedics;  Laterality: Right;    There were no vitals filed for this visit.   Subjective Assessment - 01/31/21 0802     Subjective I'm doing OK, my back has been acting up a bit more- I'm back to getting occasional shooting pains down my leg. I feel like I'm about where I'd like to be, I can walk around without getting super tired and my legs don't feel like they're going to die on me. I've been going to the gym and working on machines for strength with my brother, I'm comfortable with my HEP and would really like to work on stretching it.    Pertinent History L fem-pop bypass graft, Lumbar fusion, R THR,    Currently in Pain? Yes    Pain Score 5     Pain Location Back    Pain Orientation Right;Left    Pain Descriptors /  Indicators Discomfort;Tightness    Pain Type Chronic pain                               [facility] Adult PT Treatment/Exercise - 01/31/21 0001       Knee/Hip Exercises: Stretches   Active Hamstring Stretch Right;3 reps;30 seconds    Piriformis Stretch Right;3 reps;30 seconds    Other Knee/Hip Stretches R KTC 3x30 seconds    Other Knee/Hip Stretches sciatic flossing in sitting 1x10; QL 3 directions 30 seconds each way; lumbar rotation stretch 2x30 seconds B      Knee/Hip Exercises: Standing   Heel Raises Right;1 set;10 reps    Lateral Step Up Right;1 set;15 reps;Hand Hold: 1    Forward Step Up Right;1 set;15 reps;Step Height: 6";Hand Hold: 1                  Balance Exercises - 01/31/21 0001       Balance Exercises: Standing   Standing Eyes Opened Narrow base of support (BOS);Foam/compliant surface;1 rep;30 secs    Tandem Stance Eyes open;3 reps    SLS Eyes open;Solid surface;3 reps;10 secs                PT Education - 01/31/21 0845     Education Details possible DC today; will await update from him after MD appt. Has likely come very close to if not already at the point of maximizing benefit form PT at this point    Person(s) Educated Patient    Methods Explanation    Comprehension Verbalized understanding              PT Short Term Goals - 12/30/20 0814       PT SHORT TERM GOAL #1   Title independent with initial HEP    Time 2    Period Weeks    Status Achieved               PT Long Term Goals - 01/19/21 0813       PT LONG TERM GOAL #1   Title Increase strength in BLE to 4+/5    Status Partially Met      PT LONG TERM GOAL #2   Title Patient will be able to walk > 1000' without stopping due to pain, I, no AD, on level and unlevel surfaces.      PT LONG TERM GOAL #3   Title Increase flexibility in spine and hips to allow patient to cross either leg over the other to don shoes.    Status Partially Met      PT LONG TERM GOAL #4   Title Climb up and down at least 12 stairs without handrail, step over step, I.    Status Achieved                   Plan - 01/31/21 0847     Clinical Impression Statement Gerald Carpenter arrives today feeling well, reports he is still dealing with chronic pain but has been able to walk much better and has even been going to the gym with his brother to work on further strengthening. Wants to really focus on mobility/stretching today, feels very tight especially on the right side. Tells me he is very close to where he'd like to be physically. Otherwise worked on a bit of physical strengthening today too. I do think he's very close to having maximized benefit from skilled   PT  services at this point, especially since he is regularly going to the gym- we discussed possibly making today the last day, he prefers to wait until after MD appt on Friday. Will reach out to MD with progress report/update.    Personal Factors and Comorbidities Age;Past/Current Experience;Comorbidity 2    Comorbidities arthritis, PVD    Examination-Activity Limitations Bathing;Locomotion Level;Bend;Sleep;Squat;Dressing;Stand    Examination-Participation Restrictions Driving;Community Activity    Stability/Clinical Decision Making Evolving/Moderate complexity    Clinical Decision Making Moderate    Rehab Potential Good    PT Frequency 2x / week    PT Duration 4 weeks    PT Treatment/Interventions ADLs/Self Care Home Management;Iontophoresis 15m/ml Dexamethasone;Gait training;Stair training;Functional mobility training;Therapeutic activities;Therapeutic exercise;Moist Heat;Balance training;Neuromuscular re-education;Manual techniques;Dry needling;Vasopneumatic Device;Patient/family education;Electrical Stimulation    PT Next Visit Plan Progressive strength and flexibility program, balance training. Likely DC within next 1-2 sessions.    PT Home Exercise Plan Access Code: [email or web address]   Consulted and Agree with Plan of Care Patient             Patient will benefit from skilled therapeutic intervention in order to improve the following deficits and impairments:  Abnormal gait, Difficulty walking, Increased fascial restricitons, Decreased endurance, Increased muscle spasms, Decreased activity tolerance, Pain, Decreased balance, Impaired flexibility, Decreased strength, Postural dysfunction  Visit Diagnosis: Muscle weakness (generalized)  Acute right-sided low back pain with right-sided sciatica  Muscle spasm of back  Difficulty in walking, not elsewhere classified  Unsteadiness on feet     Problem List Patient Active Problem List   Diagnosis Date Noted   Leukocytosis 10/11/2020   PAD  (peripheral artery disease) ([facility] 06/29/2020   Prediabetes 04/22/2020   Chronic kidney disease, stage 3b ([facility] 04/15/2020   Tobacco dependence due to cigarettes 04/12/2020   Lower urinary tract symptoms (LUTS) 04/12/2020   History of colon polyps 04/12/2020   Atherosclerosis of native arteries of extremity with intermittent claudication ([facility] 03/01/2020   Pain in both feet 07/31/2019   Primary osteoarthritis of right knee 07/07/2019   Status post total replacement of right hip 05/25/2019   Lumbar radiculopathy 02/05/2019   KAnn LionsPT, DPT, PN2   Supplemental Physical Therapist [facility]   Pager [phone] Rehab Office [address] [place] [address]Phone: [phone]  Fax:  [phone] Name: Gerald DaneseMRN: [id number] of Birth: 107-14-1952

## 2021-02-03 ENCOUNTER — Encounter
Payer: Medicare Other | Attending: Physical Medicine and Rehabilitation | Admitting: Physical Medicine and Rehabilitation

## 2021-02-03 ENCOUNTER — Other Ambulatory Visit: Payer: Self-pay

## 2021-02-03 VITALS — BP 120/74 | HR 70 | Temp 98.1°F | Ht 68.0 in | Wt 155.0 lb

## 2021-02-03 DIAGNOSIS — G8929 Other chronic pain: Secondary | ICD-10-CM

## 2021-02-03 DIAGNOSIS — M5441 Lumbago with sciatica, right side: Secondary | ICD-10-CM

## 2021-02-03 DIAGNOSIS — E785 Hyperlipidemia, unspecified: Secondary | ICD-10-CM

## 2021-02-03 NOTE — Patient Instructions (Signed)
°  Supplements that can help keep bad cholesterol (LDL) low: 1) Vitamin B3 500-4,000mg  in divided doses daily (would recommend starting low as can cause uncomfortable facial flushing if started at too high a dose) 2) Phytosterols 2.15 grams daily 3) Fermented soy 30-50 grams daily 4) EGCG (found in green tea): 500-1000mg  daily 5) Omega-3 fatty acids 3000-5,000mg  daily 6) Flax seed 40 grams daily 7) Monounsaturated fats 20-40 grams daily (olives, olive oil, nuts), also reduces cardiovascular disease 8) Sesame: 40 grams daily 9) Gamma/delta tocotrienols- a family of unsaturated forms of Vitamin E- 200mg  with dinner 10) Pantethine 900mg  daily in divided doses 11) Resveratrol 250mg  daily 12) N Acetyl Cysteine 2000mg  daily in divided doses 13) Curcumin 2000-5000mg  in divided doses daily 14) Pomegranate juice: 8 ounces daily, also helps to lower blood pressure 15) Pomegranate seeds one cup daily, also helps to lower blood pressure 16) Citrus Bergamot 1000mg  daily, also helps with glucose control and weight loss 17) Vitamin C 500mg  daily 18) Quercetin 500-1000mg  daily 19) Glutathione 20) Probiotics 60-100 billion organisms per day 21) Fiber 22) Oats 23) Aged garlic (can eat as food or supplement of 600-900mg  per day) 24) Chia seeds 25 grams per day 25) Lycopene- carotenoid found in high concentrations in tomatoes. 26) Alpha linolenic acid 27) Flavonoids and anthocyanins 28) Wogonin- flavanoid that enhances reverse cholesterol transport 29) Coenzyme Q10 30) Pantethine- derivative of Vitamin B5: 300mg  three times per day or 450mg  twice per day with or without food 31) Barley and other whole grains 32) Orange juice 33) L- carnitine 34) L- Lysine 35) L- Arginine 36) Almonds 37) Morin 38) Rutin 39) Carnosine 40) Histidine  41) Kaempferol  42) Organosulfur compounds 43) Vitamin E 44) Oleic acid 45) RBO (ferulic acid gammaoryzanol) 46) grape seed extract 47) Red wine 48) Berberine  HCL 500mg  daily or twice per day- more effective and with fewer adverse effects that ezetimibe monotherapy 49) red yeast rice 2400- 4800 mg/day  Foods that may reduce pain: 1) Ginger (especially studied for arthritis)- reduce leukotriene production to decrease inflammation 2) Blueberries- high in phytonutrients that decrease inflammation 3) Salmon- marine omega-3s reduce joint swelling and pain 4) Pumpkin seeds- reduce inflammation 5) dark chocolate- reduces inflammation 6) turmeric- reduces inflammation 7) tart cherries - reduce pain and stiffness 8) extra virgin olive oil - its compound olecanthal helps to block prostaglandins  9) chili peppers- can be eaten or applied topically via capsaicin 10) mint- helpful for headache, muscle aches, joint pain, and itching 11) garlic- reduces inflammation  Link to further information on diet for chronic pain: [email or web address]  50) chlorella 51) Licorice

## 2021-02-27 ENCOUNTER — Telehealth: Payer: Self-pay

## 2021-02-27 NOTE — Telephone Encounter (Signed)
Received a surgery clearnace for for Cardiac Clearance for Hemorrhoidectomy surgery to be done under general anesthesia from [facility] (no date set).   LOV 01/19/21 FOV 04/18/21  Doses he need an appointment?  Please review and advise.  Thanks.  Dm/cma

## 2021-02-28 ENCOUNTER — Encounter: Payer: Self-pay | Admitting: Orthopaedic Surgery

## 2021-02-28 ENCOUNTER — Ambulatory Visit (INDEPENDENT_AMBULATORY_CARE_PROVIDER_SITE_OTHER): Payer: Medicare Other

## 2021-02-28 ENCOUNTER — Telehealth: Payer: Self-pay

## 2021-02-28 ENCOUNTER — Ambulatory Visit (INDEPENDENT_AMBULATORY_CARE_PROVIDER_SITE_OTHER): Payer: Medicare Other | Admitting: Orthopaedic Surgery

## 2021-02-28 ENCOUNTER — Other Ambulatory Visit: Payer: Self-pay

## 2021-02-28 DIAGNOSIS — M25551 Pain in right hip: Secondary | ICD-10-CM

## 2021-02-28 DIAGNOSIS — M1711 Unilateral primary osteoarthritis, right knee: Secondary | ICD-10-CM

## 2021-02-28 MED ORDER — METHYLPREDNISOLONE 4 MG PO TBPK: ORAL_TABLET | ORAL | 0 refills | Status: DC

## 2021-02-28 NOTE — Telephone Encounter (Signed)
Called [facility],advised that he will need to see his Cardiologist for clearance. They will contact him to advise so that he can get an appointment with them.  Dm/cma

## 2021-02-28 NOTE — Telephone Encounter (Signed)
-----   Message from [name], MD sent at 02/28/2021  9:11 AM EST ----- Regarding: RE: Clearance Widely patent bypass on most recent duplex.  Cleared from my standpoint.  I imagine they want to hold his aspirin, but if not would continue or restart as soon as possible.  Thanks,  Gerald Stabs  ----- Message ----- From: Nicholas Lose, RN Sent: 02/28/2021   8:54 AM EST To: [name], MD Subject: Clearance                                      Patient needs to be scheduled for hemorrhoidectomy surgery under general anesthesia with Dr. Nadeen Landau. He is s/p left common femoral to above-the-knee popliteal artery bypass with vein on 06/29/2020 due to lifestyle limiting claudication. Last seen by PA on 10/19/20 and to follow up in 6 months.   Office request clearance in order to schedule surgery. Pt is taking ASA. No anticoagulants on record. Please advise.   Thanks,  [place]

## 2021-02-28 NOTE — Telephone Encounter (Signed)
Clearance faxed to [facility] [phone], attn. Malachi Bonds, CMA.

## 2021-02-28 NOTE — Progress Notes (Signed)
Office Visit Note   Patient: Gerald Carpenter           Date of Birth: [birth year]           MRN: [id number] Visit Date: 02/28/2021              Requested by: Haydee Salter, MD [place],  [place] [id number] PCP: Haydee Salter, MD   Assessment & Plan: Visit Diagnoses:  1. Pain in right hip   2. Primary osteoarthritis of right knee     Plan: Mr. Gerald Carpenter is a very pleasant 71 year old gentleman comes in for evaluation of posterior right hip pain.  He is status post right total hip replacement dual mobility on 05/25/2019.  He states that he has been having pain in the last month and a half.  Denies any radiation of pain.  Denies any groin pain.  He has been recovering from bypass surgery to the left leg that was performed in May 2022.  He has been significantly deconditioned and is currently in physical therapy for left lower extremity strengthening.  He is also following up for right knee OA.  Right hip examination shows fully healed surgical scar without any evidence of infection or swelling.  He has excellent range of motion of the hip without any pain.  No pain with straight leg raise or logroll.  He has tenderness to the posterior lateral hip near the external rotators.  No sciatic tension signs.  Examination of the right knee is unchanged.  In regards to the hip there is no problems with the implant.  I think he is suffering from deconditioning and tendinitis from the short external rotators.  This is likely related to recovery from his bypass surgery in which she has gotten quite deconditioned.  I sent a prescription for Medrol Dosepak and referral for outpatient physical therapy for strengthening the right lower extremity and right hip.  For the right knee we will obtain authorization for Visco again since the previous 1 worked really well and gave him over a year of relief.  This patient is diagnosed with osteoarthritis of the knee(s).    Radiographs show  evidence of joint space narrowing, osteophytes, subchondral sclerosis and/or subchondral cysts.  This patient has knee pain which interferes with functional and activities of daily living.    This patient has experienced inadequate response, adverse effects and/or intolerance with conservative treatments such as acetaminophen, NSAIDS, topical creams, physical therapy or regular exercise, knee bracing and/or weight loss.   This patient has experienced inadequate response or has a contraindication to intra articular steroid injections for at least 3 months.   This patient is not scheduled to have a total knee replacement within 6 months of starting treatment with viscosupplementation.  Follow-Up Instructions: No follow-ups on file.   Orders:  Orders Placed This Encounter  Procedures   XR HIP UNILAT W OR W/O PELVIS 2-3 VIEWS RIGHT   Ambulatory referral to Physical Therapy   Meds ordered this encounter  Medications   methylPREDNISolone (MEDROL DOSEPAK) 4 MG TBPK tablet    Sig: Take as directed    Dispense:  21 tablet    Refill:  0      Procedures: No procedures performed   Clinical Data: No additional findings.   Subjective: Chief Complaint  Patient presents with   Right Hip - Pain    HPI  Review of Systems   Objective: Vital Signs: There were no vitals taken for this visit.  Physical Exam  Ortho Exam  Specialty Comments:  No specialty comments available.  Imaging: XR HIP UNILAT W OR W/O PELVIS 2-3 VIEWS RIGHT  Result Date: 02/28/2021 Stable right total hip replacement without complication.    PMFS History: Patient Active Problem List   Diagnosis Date Noted   Leukocytosis 10/11/2020   PAD (peripheral artery disease) ([place]) 06/29/2020   Prediabetes 04/22/2020   Chronic kidney disease, stage 3b (Gerald Carpenter) 04/15/2020   Tobacco dependence due to cigarettes 04/12/2020   Lower urinary tract symptoms (LUTS) 04/12/2020   History of colon polyps 04/12/2020    Atherosclerosis of native arteries of extremity with intermittent claudication (Gerald Carpenter) 03/01/2020   Pain in both feet 07/31/2019   Primary osteoarthritis of right knee 07/07/2019   Status post total replacement of right hip 05/25/2019   Lumbar radiculopathy 02/05/2019   Past Medical History:  Diagnosis Date   Arthritis    Complication of anesthesia    Family history of adverse reaction to anesthesia    daughter- N/V   Nephritis    when pt. was 7 yrs. ols   Peripheral vascular disease (HCC)    PONV (postoperative nausea and vomiting)    "Only when I have aortagram on 06/23/20".   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    Family History  Problem Relation Age of Onset   Pulmonary fibrosis Mother    Heart disease Father     Past Surgical History:  Procedure Laterality Date   ABDOMINAL AORTOGRAM W/LOWER EXTREMITY N/A 06/23/2020   Procedure: ABDOMINAL AORTOGRAM W/LOWER EXTREMITY;  Surgeon: [name], MD;  Location: [place] [place];  Service: Cardiovascular;  Laterality: N/A;   ANTERIOR LAT LUMBAR FUSION Right 02/05/2019   Procedure: ATTEMPTED ANTERIOR LATERAL INTERBODY FUSION;  Surgeon: Consuella Lose, MD;  Location: [facility];  Service: Neurosurgery;  Laterality: Right;  RIGHT LATERAL TRANSPSOAS DISCECTOMY AND FUSION WITH ROBOTIC ASSISTED PEDICLE SCREW STABILIZATION, LUMBAR 4- LUMBAR 5   COLONOSCOPY     ENDOVEIN HARVEST OF GREATER SAPHENOUS VEIN Left 06/29/2020   Procedure: HARVEST OF LEFT GREATER SAPHENOUS VEIN;  Surgeon: [name], MD;  Location: [facility];  Service: Vascular;  Laterality: Left;   EYE SURGERY Bilateral 2013   lens implants for cataracts after Lasik   FEMORAL-POPLITEAL BYPASS GRAFT Left 06/29/2020   Procedure: BYPASS GRAFT LEFT COMMON FEMORAL TO ABOVE KNEE POPLITEAL ARTERY;  Surgeon: [name], MD;  Location: [facility];  Service: Vascular;  Laterality: Left;   KNEE ARTHROSCOPY Right 1980   TONSILLECTOMY     at a very young age, but grew  back   TOTAL HIP ARTHROPLASTY Right 05/25/2019   Procedure: RIGHT TOTAL HIP ARTHROPLASTY ANTERIOR APPROACH;  Surgeon: Leandrew Koyanagi, MD;  Location: [facility];  Service: Orthopedics;  Laterality: Right;   Social History   Occupational History   Not on file  Tobacco Use   Smoking status: Some Days    Packs/day: 0.25    Years: 53.00    Pack years: 13.25    Types: Cigarettes   Smokeless tobacco: Never   Tobacco comments:    had first cigarettes age  41, habit started at age 56  Vaping Use   Vaping Use: Never used  Substance and Sexual Activity   Alcohol use: Not Currently    Comment: 05/10/1991   Drug use: Not Currently    Comment: prior usage of "speed"  when drinking   Sexual activity: Yes

## 2021-03-10 ENCOUNTER — Ambulatory Visit: Payer: Medicare Other | Attending: Orthopaedic Surgery | Admitting: Physical Therapy

## 2021-03-10 ENCOUNTER — Encounter: Payer: Self-pay | Admitting: Physical Therapy

## 2021-03-10 ENCOUNTER — Other Ambulatory Visit: Payer: Self-pay

## 2021-03-10 DIAGNOSIS — M5441 Lumbago with sciatica, right side: Secondary | ICD-10-CM

## 2021-03-10 DIAGNOSIS — R2681 Unsteadiness on feet: Secondary | ICD-10-CM | POA: Diagnosis present

## 2021-03-10 DIAGNOSIS — R262 Difficulty in walking, not elsewhere classified: Secondary | ICD-10-CM

## 2021-03-10 DIAGNOSIS — M25551 Pain in right hip: Secondary | ICD-10-CM | POA: Diagnosis present

## 2021-03-10 DIAGNOSIS — M6281 Muscle weakness (generalized): Secondary | ICD-10-CM | POA: Diagnosis not present

## 2021-03-10 NOTE — Patient Instructions (Signed)
Access Code: [email or web address] URL: [email or web address] Date: 03/10/2021 Prepared by: Ethel Rana  Exercises Supine Figure 4 Piriformis Stretch - 1 x daily - 7 x weekly - 3 sets - 20 hold Supine Gluteus Stretch - 1 x daily - 7 x weekly - 3 sets - 20 hold Supine Hamstring Stretch - 1 x daily - 7 x weekly - 3 sets - 20 hold Hooklying Clamshell with Resistance - 1 x daily - 7 x weekly - 2 sets - 10 reps Supine Bridge with Resistance Band - 1 x daily - 7 x weekly - 2 sets - 10 reps

## 2021-03-10 NOTE — Therapy (Signed)
[facility] [place]. [place], [place], [address] Phone: [phone]   Fax:  [phone]  Physical Therapy Evaluation  Patient Details  Name: Gerald Carpenter MRN: [id number] Date of Birth: [birth year] Referring Provider (PT): Frankey Shown   Encounter Date: 03/10/2021   PT End of Session - 03/10/21 0943     Visit Number 1    Number of Visits 20    Date for PT Re-Evaluation 05/19/21    PT Start Time 0847    PT Stop Time 0928    PT Time Calculation (min) 41 min    Activity Tolerance Patient tolerated treatment well;Patient limited by pain    Behavior During Therapy [facility] for tasks assessed/performed             Past Medical History:  Diagnosis Date   Arthritis    Complication of anesthesia    Family history of adverse reaction to anesthesia    daughter- N/V   Nephritis    when pt. was 7 yrs. ols   Peripheral vascular disease (HCC)    PONV (postoperative nausea and vomiting)    "Only when I have aortagram on 06/23/20".   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    Past Surgical History:  Procedure Laterality Date   ABDOMINAL AORTOGRAM W/LOWER EXTREMITY N/A 06/23/2020   Procedure: ABDOMINAL AORTOGRAM W/LOWER EXTREMITY;  Surgeon: [name], MD;  Location: [facility];  Service: Cardiovascular;  Laterality: N/A;   ANTERIOR LAT LUMBAR FUSION Right 02/05/2019   Procedure: ATTEMPTED ANTERIOR LATERAL INTERBODY FUSION;  Surgeon: Consuella Lose, MD;  Location: [facility];  Service: Neurosurgery;  Laterality: Right;  RIGHT LATERAL TRANSPSOAS DISCECTOMY AND FUSION WITH ROBOTIC ASSISTED PEDICLE SCREW STABILIZATION, LUMBAR 4- LUMBAR 5   COLONOSCOPY     ENDOVEIN HARVEST OF GREATER SAPHENOUS VEIN Left 06/29/2020   Procedure: HARVEST OF LEFT GREATER SAPHENOUS VEIN;  Surgeon: [name], MD;  Location: [facility];  Service: Vascular;  Laterality: Left;   EYE SURGERY Bilateral 2013   lens implants for cataracts  after Lasik   FEMORAL-POPLITEAL BYPASS GRAFT Left 06/29/2020   Procedure: BYPASS GRAFT LEFT COMMON FEMORAL TO ABOVE KNEE POPLITEAL ARTERY;  Surgeon: [name], MD;  Location: [facility];  Service: Vascular;  Laterality: Left;   KNEE ARTHROSCOPY Right 1980   TONSILLECTOMY     at a very young age, but grew back   TOTAL HIP ARTHROPLASTY Right 05/25/2019   Procedure: RIGHT TOTAL HIP ARTHROPLASTY ANTERIOR APPROACH;  Surgeon: Leandrew Koyanagi, MD;  Location: [facility];  Service: Orthopedics;  Laterality: Right;    There were no vitals filed for this visit.    Subjective Assessment - 03/10/21 0848     Subjective Pateitn arrives with C/O R hip pain. Dr feels he has tendonitis in his small hip ER. He prescribed anti inflammatories, which relieved the pain while he was taking them, but the pain has returned since stopping the medication.    Pertinent History L fem-pop bypass graft, Lumbar fusion, R THR,    How long can you sit comfortably? Constant pain    How long can you stand comfortably? Constant pain    How long can you walk comfortably? Constant pain    Diagnostic tests R hip prosthesis (-) for injury or disfunction.    Patient Stated Goals Decrease the pain.    Currently in Pain? Yes    Pain Score 4     Pain Location Hip  Pain Orientation Right;Posterior;Proximal    Pain Descriptors / Indicators Constant;Aching    Pain Type Chronic pain    Pain Radiating Towards occasionally down the leg    Pain Onset More than a month ago    Pain Frequency Constant    Aggravating Factors  Always present.    Pain Relieving Factors Anti inflammatories.                [facility] PT Assessment - 03/10/21 0001       Assessment   Medical Diagnosis R hip pain    Referring Provider (PT) Erlinda Hong, [place]      Precautions   Precautions None      Restrictions   Weight Bearing Restrictions No      Balance Screen   Has the patient fallen in the past 6 months No    Has the patient had a decrease in  activity level because of a fear of falling?  No    Is the patient reluctant to leave their home because of a fear of falling?  No      Home Ecologist residence      Prior Function   Level of Independence Independent    Vocation Retired    Leisure Youth worker work, walk the Community education officer   Overall Cognitive Status Within [employer] for tasks assessed      Posture/Postural Control   Posture/Postural Control Postural limitations    Postural Limitations Rounded Shoulders;Forward head;Increased thoracic kyphosis;Decreased lumbar lordosis      ROM / Strength   AROM / PROM / Strength AROM;Strength      AROM   AROM Assessment Site Hip    Right/Left Hip Right;Left    Right Hip Extension 30    Right Hip Flexion 110    Right Hip External Rotation  20    Right Hip Internal Rotation  20    Left Hip Extension 30    Left Hip Flexion 120    Left Hip External Rotation  30    Left Hip Internal Rotation  30      Strength   Right Hip Flexion 4-/5    Right Hip Extension 3+/5    Right Hip External Rotation  3+/5    Right Hip Internal Rotation 3+/5    Right Hip ABduction 3+/5    Left Hip Flexion 4+/5    Left Hip Extension 3+/5    Left Hip External Rotation 3+/5    Left Hip Internal Rotation 3+/5    Left Hip ABduction 3/5    Right Knee Flexion 3+/5    Right Knee Extension 4+/5    Left Knee Flexion 4+/5    Left Knee Extension 4+/5    Right Ankle Dorsiflexion 5/5    Left Ankle Dorsiflexion 5/5      Flexibility   Piriformis R-TTP with muscle twitches. also R TFL tihgtness and TTP      Ambulation/Gait   Gait Comments Antalgic gait favoring RLE with decreased WB through R, increased trunk sway.                        Objective measurements completed on examination: See above findings.       [facility] Adult PT Treatment/Exercise - 03/10/21 0001       Knee/Hip Exercises: Stretches   Active Hamstring Stretch Right;3 reps;30 seconds     Piriformis Stretch Right;3 reps;30 seconds  Other Knee/Hip Stretches sciatic flossing in sitting 1x10; QL 3 directions 30 seconds each way; lumbar rotation stretch 2x30 seconds B      Knee/Hip Exercises: Supine   Bridges with Clamshell Strengthening;Both;1 set;10 reps    Other Supine Knee/Hip Exercises clamshells with G Tband      Modalities   Modalities Iontophoresis      Iontophoresis   Type of Iontophoresis Dexamethasone    Location R piriformis    Dose 1.2cc    Time 4 hr                     PT Education - 03/10/21 0925     Education Details HEP, Iontophoresis, POC.    Person(s) Educated Patient    Methods Explanation;Demonstration;Handout    Comprehension Returned demonstration;Verbalized understanding              PT Short Term Goals - 03/10/21 0954       PT SHORT TERM GOAL #1   Title independent with initial HEP    Baseline Initiated HEP    Time 4    Period Weeks    Status New    Target Date 04/07/21               PT Long Term Goals - 03/10/21 0954       PT LONG TERM GOAL #1   Title I with final HEP    Time 10    Period Weeks    Status New    Target Date 05/19/21      PT LONG TERM GOAL #2   Title Decrease pain in R hip to </+2/10 in sit, stand, or ambulation x at least 1000'.    Baseline Constant at level 4-5    Time 10    Period Weeks    Status New    Target Date 05/19/21      PT LONG TERM GOAL #3   Title Increase B hip pain to 4/5 throughout all planes.    Baseline 3+/5    Time 10    Period Weeks    Status New    Target Date 05/19/21      PT LONG TERM GOAL #4   Title Patient will ambulate with normal movement pattern, no antalgic compensations, iwth DGI score of at least 20/24 to demonstrate decreased fall risk.    Baseline DGI- TBD    Time 10    Period Weeks    Status New    Target Date 05/19/21                    Plan - 03/10/21 0945     Clinical Impression Statement Patient reports severe R hip  pain, which primarily remains in his posterior hip, but occasionally twinges into R ant leg. He also reports pain in R TFL area. Dr Erlinda Hong suspectsdeconditioning and tendinitis in R short ER. He demonstrates weakness in B glutes, TTP over R piriformis and TFL, decreased ROm in B hips, R<L, antalgic and unsteady gait. He will benefit from PT to manage pain, strengthen hip muscles, stretch tight muscles, re-train gait to not favor his RLE, and for balance training to decreased fall risk    Personal Factors and Comorbidities Age;Past/Current Experience;Comorbidity 2    Comorbidities arthritis, PVD    Examination-Activity Limitations Bathing;Locomotion Level;Bend;Sleep;Squat;Dressing;Stand    Examination-Participation Restrictions Driving;Community Activity    Stability/Clinical Decision Making Evolving/Moderate complexity    Clinical Decision Making Moderate    Rehab  Potential Good    PT Frequency 2x / week    PT Duration Other (comment)   10w   PT Treatment/Interventions ADLs/Self Care Home Management;Iontophoresis 4mg /ml Dexamethasone;Gait training;Stair training;Functional mobility training;Therapeutic activities;Therapeutic exercise;Moist Heat;Balance training;Neuromuscular re-education;Manual techniques;Dry needling;Vasopneumatic Device;Patient/family education;Electrical Stimulation;Cryotherapy    PT Next Visit Plan Assess effects of Ionto, assess HEP tolerance and update, STM to piriformis and TFL. Assess DGI    PT Home Exercise Plan [email or web address]    Consulted and Agree with Plan of Care Patient             Patient will benefit from skilled therapeutic intervention in order to improve the following deficits and impairments:  Abnormal gait, Difficulty walking, Increased fascial restricitons, Decreased endurance, Increased muscle spasms, Decreased activity tolerance, Pain, Decreased balance, Impaired flexibility, Decreased strength, Postural dysfunction, Decreased range of motion, Improper body  mechanics  Visit Diagnosis: Muscle weakness (generalized)  Acute right-sided low back pain with right-sided sciatica  Difficulty in walking, not elsewhere classified  Unsteadiness on feet  Pain in right hip     Problem List Patient Active Problem List   Diagnosis Date Noted   Leukocytosis 10/11/2020   PAD (peripheral artery disease) ([place]) 06/29/2020   Prediabetes 04/22/2020   Chronic kidney disease, stage 3b ([place]) 04/15/2020   Tobacco dependence due to cigarettes 04/12/2020   Lower urinary tract symptoms (LUTS) 04/12/2020   History of colon polyps 04/12/2020   Atherosclerosis of native arteries of extremity with intermittent claudication ([place]) 03/01/2020   Pain in both feet 07/31/2019   Primary osteoarthritis of right knee 07/07/2019   Status post total replacement of right hip 05/25/2019   Lumbar radiculopathy 02/05/2019    Marcelina Morel, DPT 03/10/2021, 10:00 AM  [place]. [place], [place], [address] Phone: [phone]   Fax:  [phone]  Name: Gerald Carpenter MRN: [id number] Date of Birth: August 05, 1950

## 2021-03-14 ENCOUNTER — Other Ambulatory Visit: Payer: Self-pay

## 2021-03-14 ENCOUNTER — Encounter: Payer: Self-pay | Admitting: Physical Therapy

## 2021-03-14 ENCOUNTER — Ambulatory Visit: Payer: Medicare Other | Admitting: Physical Therapy

## 2021-03-14 DIAGNOSIS — R2681 Unsteadiness on feet: Secondary | ICD-10-CM

## 2021-03-14 DIAGNOSIS — M5441 Lumbago with sciatica, right side: Secondary | ICD-10-CM

## 2021-03-14 DIAGNOSIS — R262 Difficulty in walking, not elsewhere classified: Secondary | ICD-10-CM

## 2021-03-14 DIAGNOSIS — M6281 Muscle weakness (generalized): Secondary | ICD-10-CM

## 2021-03-14 NOTE — Therapy (Signed)
[facility] [place]. [place], [place], [address] Phone: [phone]   Fax:  [phone]  Physical Therapy Treatment  Patient Details  Name: Gerald Carpenter MRN: [id number] Date of Birth: 06-Nov-1950 Referring Provider (PT): Frankey Shown   Encounter Date: 03/14/2021   PT End of Session - 03/14/21 1008     Visit Number 2    Date for PT Re-Evaluation 05/19/21    PT Start Time 0930    PT Stop Time 1011    PT Time Calculation (min) 41 min    Activity Tolerance Patient tolerated treatment well    Behavior During Therapy [facility] for tasks assessed/performed             Past Medical History:  Diagnosis Date   Arthritis    Complication of anesthesia    Family history of adverse reaction to anesthesia    daughter- N/V   Nephritis    when pt. was 7 yrs. ols   Peripheral vascular disease (HCC)    PONV (postoperative nausea and vomiting)    "Only when I have aortagram on 06/23/20".   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    Past Surgical History:  Procedure Laterality Date   ABDOMINAL AORTOGRAM W/LOWER EXTREMITY N/A 06/23/2020   Procedure: ABDOMINAL AORTOGRAM W/LOWER EXTREMITY;  Surgeon: [name], MD;  Location: [facility];  Service: Cardiovascular;  Laterality: N/A;   ANTERIOR LAT LUMBAR FUSION Right 02/05/2019   Procedure: ATTEMPTED ANTERIOR LATERAL INTERBODY FUSION;  Surgeon: Consuella Lose, MD;  Location: [facility];  Service: Neurosurgery;  Laterality: Right;  RIGHT LATERAL TRANSPSOAS DISCECTOMY AND FUSION WITH ROBOTIC ASSISTED PEDICLE SCREW STABILIZATION, LUMBAR 4- LUMBAR 5   COLONOSCOPY     ENDOVEIN HARVEST OF GREATER SAPHENOUS VEIN Left 06/29/2020   Procedure: HARVEST OF LEFT GREATER SAPHENOUS VEIN;  Surgeon: [name], MD;  Location: [facility];  Service: Vascular;  Laterality: Left;   EYE SURGERY Bilateral 2013   lens implants for cataracts after Lasik   FEMORAL-POPLITEAL BYPASS GRAFT  Left 06/29/2020   Procedure: BYPASS GRAFT LEFT COMMON FEMORAL TO ABOVE KNEE POPLITEAL ARTERY;  Surgeon: [name], MD;  Location: [facility];  Service: Vascular;  Laterality: Left;   KNEE ARTHROSCOPY Right 1980   TONSILLECTOMY     at a very young age, but grew back   TOTAL HIP ARTHROPLASTY Right 05/25/2019   Procedure: RIGHT TOTAL HIP ARTHROPLASTY ANTERIOR APPROACH;  Surgeon: Leandrew Koyanagi, MD;  Location: [facility];  Service: Orthopedics;  Laterality: Right;    There were no vitals filed for this visit.   Subjective Assessment - 03/14/21 0930     Subjective Doing all right, reports pulling his R groin doing his HEP,    Currently in Pain? Yes    Pain Score 2     Pain Location Knee    Pain Orientation Right                               [facility] Adult PT Treatment/Exercise - 03/14/21 0001       Knee/Hip Exercises: Stretches   Passive Hamstring Stretch Both;5 reps;10 seconds    Hip Flexor Stretch Right;4 reps;10 seconds    ITB Stretch Right;3 reps;10 seconds    Other Knee/Hip Stretches R hip adductior stretch      Knee/Hip Exercises: Aerobic   Nustep L5 x6 LE      Knee/Hip Exercises: Machines for  Strengthening   Cybex Knee Extension 10lb 2x12    Cybex Knee Flexion 25lb 2x12      Knee/Hip Exercises: Seated   Sit to Sand 2 sets;without UE support;10 reps   holding yellow ball     Knee/Hip Exercises: Supine   Bridges Strengthening;Both;2 sets;10 reps    Other Supine Knee/Hip Exercises clamshells with G Tband 2x15    Other Supine Knee/Hip Exercises Ball squeezes 2x10                       PT Short Term Goals - 03/10/21 0954       PT SHORT TERM GOAL #1   Title independent with initial HEP    Baseline Initiated HEP    Time 4    Period Weeks    Status New    Target Date 04/07/21               PT Long Term Goals - 03/10/21 0954       PT LONG TERM GOAL #1   Title I with final HEP    Time 10    Period Weeks    Status New     Target Date 05/19/21      PT LONG TERM GOAL #2   Title Decrease pain in R hip to </+2/10 in sit, stand, or ambulation x at least 1000'.    Baseline Constant at level 4-5    Time 10    Period Weeks    Status New    Target Date 05/19/21      PT LONG TERM GOAL #3   Title Increase B hip pain to 4/5 throughout all planes.    Baseline 3+/5    Time 10    Period Weeks    Status New    Target Date 05/19/21      PT LONG TERM GOAL #4   Title Patient will ambulate with normal movement pattern, no antalgic compensations, iwth DGI score of at least 20/24 to demonstrate decreased fall risk.    Baseline DGI- TBD    Time 10    Period Weeks    Status New    Target Date 05/19/21                   Plan - 03/14/21 1009     Clinical Impression Statement Pt tolerated an initial progression to TE well. No issues with machine level strengthening. Some fatigue noted with sit to stands. Pt reports that's supine bridges felt good. No issues with hook lying ball squeezes despite reporting a pulled groin. R ITB and hip flexor tightness note with passive stretching.    Personal Factors and Comorbidities Age;Past/Current Experience;Comorbidity 2    Examination-Activity Limitations Bathing;Locomotion Level;Bend;Sleep;Squat;Dressing;Stand    Examination-Participation Restrictions Driving;Community Activity    Stability/Clinical Decision Making Evolving/Moderate complexity    Rehab Potential Good    PT Frequency 2x / week    PT Treatment/Interventions ADLs/Self Care Home Management;Iontophoresis 4mg /ml Dexamethasone;Gait training;Stair training;Functional mobility training;Therapeutic activities;Therapeutic exercise;Moist Heat;Balance training;Neuromuscular re-education;Manual techniques;Dry needling;Vasopneumatic Device;Patient/family education;Electrical Stimulation;Cryotherapy    PT Next Visit Plan Assess effects of Ionto, assess HEP tolerance and update, STM to piriformis and TFL. Assess DGI              Patient will benefit from skilled therapeutic intervention in order to improve the following deficits and impairments:  Abnormal gait, Difficulty walking, Increased fascial restricitons, Decreased endurance, Increased muscle spasms, Decreased activity tolerance, Pain, Decreased balance, Impaired flexibility,  Decreased strength, Postural dysfunction, Decreased range of motion, Improper body mechanics  Visit Diagnosis: Muscle weakness (generalized)  Difficulty in walking, not elsewhere classified  Unsteadiness on feet  Acute right-sided low back pain with right-sided sciatica     Problem List Patient Active Problem List   Diagnosis Date Noted   Leukocytosis 10/11/2020   PAD (peripheral artery disease) ([place]) 06/29/2020   Prediabetes 04/22/2020   Chronic kidney disease, stage 3b ([place]) 04/15/2020   Tobacco dependence due to cigarettes 04/12/2020   Lower urinary tract symptoms (LUTS) 04/12/2020   History of colon polyps 04/12/2020   Atherosclerosis of native arteries of extremity with intermittent claudication ([place]) 03/01/2020   Pain in both feet 07/31/2019   Primary osteoarthritis of right knee 07/07/2019   Status post total replacement of right hip 05/25/2019   Lumbar radiculopathy 02/05/2019    [name], PTA 03/14/2021, 10:15 AM  [facility] [place]. [place], [place], [address] Phone: [phone]   Fax:  [phone]  Name: Gerald Carpenter MRN: [id number] Date of Birth: 1951-01-15

## 2021-03-16 ENCOUNTER — Other Ambulatory Visit: Payer: Self-pay

## 2021-03-16 ENCOUNTER — Encounter: Payer: Self-pay | Admitting: Physical Therapy

## 2021-03-16 ENCOUNTER — Ambulatory Visit: Payer: Medicare Other | Admitting: Physical Therapy

## 2021-03-16 DIAGNOSIS — M6281 Muscle weakness (generalized): Secondary | ICD-10-CM

## 2021-03-16 DIAGNOSIS — R262 Difficulty in walking, not elsewhere classified: Secondary | ICD-10-CM

## 2021-03-16 DIAGNOSIS — R2681 Unsteadiness on feet: Secondary | ICD-10-CM

## 2021-03-16 DIAGNOSIS — M25551 Pain in right hip: Secondary | ICD-10-CM

## 2021-03-16 NOTE — Therapy (Signed)
[facility]. [place], [place], [address] Phone: [phone]   Fax:  [phone]  Physical Therapy Treatment  Patient Details  Name: Gunnard Dorrance MRN: [id number] Date of Birth: 1950/09/06 Referring Provider (PT): Frankey Shown   Encounter Date: 03/16/2021   PT End of Session - 03/16/21 0842     Visit Number 3    Number of Visits 20    Date for PT Re-Evaluation 05/19/21    PT Start Time 0758    PT Stop Time 0843    PT Time Calculation (min) 45 min    Activity Tolerance Patient tolerated treatment well    Behavior During Therapy [facility] for tasks assessed/performed             Past Medical History:  Diagnosis Date   Arthritis    Complication of anesthesia    Family history of adverse reaction to anesthesia    daughter- N/V   Nephritis    when pt. was 7 yrs. ols   Peripheral vascular disease (HCC)    PONV (postoperative nausea and vomiting)    "Only when I have aortagram on 06/23/20".   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    Past Surgical History:  Procedure Laterality Date   ABDOMINAL AORTOGRAM W/LOWER EXTREMITY N/A 06/23/2020   Procedure: ABDOMINAL AORTOGRAM W/LOWER EXTREMITY;  Surgeon: [name], MD;  Location: [facility];  Service: Cardiovascular;  Laterality: N/A;   ANTERIOR LAT LUMBAR FUSION Right 02/05/2019   Procedure: ATTEMPTED ANTERIOR LATERAL INTERBODY FUSION;  Surgeon: Consuella Lose, MD;  Location: [facility];  Service: Neurosurgery;  Laterality: Right;  RIGHT LATERAL TRANSPSOAS DISCECTOMY AND FUSION WITH ROBOTIC ASSISTED PEDICLE SCREW STABILIZATION, LUMBAR 4- LUMBAR 5   COLONOSCOPY     ENDOVEIN HARVEST OF GREATER SAPHENOUS VEIN Left 06/29/2020   Procedure: HARVEST OF LEFT GREATER SAPHENOUS VEIN;  Surgeon: [name], MD;  Location: [facility];  Service: Vascular;  Laterality: Left;   EYE SURGERY Bilateral 2013   lens implants for cataracts after Lasik    FEMORAL-POPLITEAL BYPASS GRAFT Left 06/29/2020   Procedure: BYPASS GRAFT LEFT COMMON FEMORAL TO ABOVE KNEE POPLITEAL ARTERY;  Surgeon: [name], MD;  Location: [facility];  Service: Vascular;  Laterality: Left;   KNEE ARTHROSCOPY Right 1980   TONSILLECTOMY     at a very young age, but grew back   TOTAL HIP ARTHROPLASTY Right 05/25/2019   Procedure: RIGHT TOTAL HIP ARTHROPLASTY ANTERIOR APPROACH;  Surgeon: Leandrew Koyanagi, MD;  Location: [facility];  Service: Orthopedics;  Laterality: Right;    There were no vitals filed for this visit.   Subjective Assessment - 03/16/21 0759     Subjective "Pretty good, just stiff"    Currently in Pain? Yes    Pain Score 1     Pain Location Hip                               [facility] Adult PT Treatment/Exercise - 03/16/21 0001       Knee/Hip Exercises: Aerobic   Nustep L5 x6 LE      Knee/Hip Exercises: Machines for Strengthening   Cybex Knee Extension 10lb 2x10    Cybex Knee Flexion 35lb 2x10      Knee/Hip Exercises: Standing   Heel Raises Both;2 sets;10 reps    Lateral Step Up Right;1 set;10 reps;Step Height: 6"    Forward Step Up Both;1  set;10 reps;Hand Hold: 0;Step Height: 6"    Walking with Sports Cord 30lb side step x5 each      Knee/Hip Exercises: Seated   Sit to Sand 2 sets;10 reps;without UE support   holding yelow ball     Knee/Hip Exercises: Supine   Bridges Strengthening;Both;1 set;15 reps    Straight Leg Raises Both;1 set;10 reps;Strengthening    Other Supine Knee/Hip Exercises Hip abd x5 each    Other Supine Knee/Hip Exercises bridge & march x 10 each                       PT Short Term Goals - 03/10/21 0954       PT SHORT TERM GOAL #1   Title independent with initial HEP    Baseline Initiated HEP    Time 4    Period Weeks    Status New    Target Date 04/07/21               PT Long Term Goals - 03/10/21 0954       PT LONG TERM GOAL #1   Title I with final HEP    Time 10     Period Weeks    Status New    Target Date 05/19/21      PT LONG TERM GOAL #2   Title Decrease pain in R hip to </+2/10 in sit, stand, or ambulation x at least 1000'.    Baseline Constant at level 4-5    Time 10    Period Weeks    Status New    Target Date 05/19/21      PT LONG TERM GOAL #3   Title Increase B hip pain to 4/5 throughout all planes.    Baseline 3+/5    Time 10    Period Weeks    Status New    Target Date 05/19/21      PT LONG TERM GOAL #4   Title Patient will ambulate with normal movement pattern, no antalgic compensations, iwth DGI score of at least 20/24 to demonstrate decreased fall risk.    Baseline DGI- TBD    Time 10    Period Weeks    Status New    Target Date 05/19/21                   Plan - 03/16/21 0842     Clinical Impression Statement No pain only R hip stiffness. Pt reports he is waiting to hear from MD for an injection I his R knee. Cues for eccentric control needed with step ups. Decrease anterior weight shift with sit to stands but able to complete. Attempted R hip ER stretch but unable to tolerated due to R knee pain. Fatigue noted after LE machine level strengthening interventions.    Personal Factors and Comorbidities Age;Past/Current Experience;Comorbidity 2    Comorbidities arthritis, PVD    Examination-Activity Limitations Bathing;Locomotion Level;Bend;Sleep;Squat;Dressing;Stand    Examination-Participation Restrictions Driving;Community Activity    Stability/Clinical Decision Making Evolving/Moderate complexity    Rehab Potential Good    PT Frequency 2x / week    PT Treatment/Interventions ADLs/Self Care Home Management;Iontophoresis 4mg /ml Dexamethasone;Gait training;Stair training;Functional mobility training;Therapeutic activities;Therapeutic exercise;Moist Heat;Balance training;Neuromuscular re-education;Manual techniques;Dry needling;Vasopneumatic Device;Patient/family education;Electrical Stimulation;Cryotherapy    PT Next  Visit Plan STM to piriformis and TFL. Assess DGI             Patient will benefit from skilled therapeutic intervention in order to improve the following  deficits and impairments:  Abnormal gait, Difficulty walking, Increased fascial restricitons, Decreased endurance, Increased muscle spasms, Decreased activity tolerance, Pain, Decreased balance, Impaired flexibility, Decreased strength, Postural dysfunction, Decreased range of motion, Improper body mechanics  Visit Diagnosis: Muscle weakness (generalized)  Unsteadiness on feet  Difficulty in walking, not elsewhere classified  Pain in right hip     Problem List Patient Active Problem List   Diagnosis Date Noted   Leukocytosis 10/11/2020   PAD (peripheral artery disease) ([place]) 06/29/2020   Prediabetes 04/22/2020   Chronic kidney disease, stage 3b ([place]) 04/15/2020   Tobacco dependence due to cigarettes 04/12/2020   Lower urinary tract symptoms (LUTS) 04/12/2020   History of colon polyps 04/12/2020   Atherosclerosis of native arteries of extremity with intermittent claudication ([place]) 03/01/2020   Pain in both feet 07/31/2019   Primary osteoarthritis of right knee 07/07/2019   Status post total replacement of right hip 05/25/2019   Lumbar radiculopathy 02/05/2019    [name], PTA 03/16/2021, 8:46 AM  [place]. [place], [place], [address] Phone: [phone]   Fax:  [phone]  Name: Clovis Mankins MRN: [id number] Date of Birth: 1950/04/05

## 2021-03-17 ENCOUNTER — Encounter: Payer: Self-pay | Admitting: Gastroenterology

## 2021-03-21 ENCOUNTER — Ambulatory Visit: Payer: Medicare Other | Admitting: Physical Therapy

## 2021-03-21 ENCOUNTER — Telehealth: Payer: Self-pay | Admitting: Orthopaedic Surgery

## 2021-03-21 ENCOUNTER — Telehealth: Payer: Self-pay

## 2021-03-21 ENCOUNTER — Other Ambulatory Visit: Payer: Self-pay

## 2021-03-21 DIAGNOSIS — R262 Difficulty in walking, not elsewhere classified: Secondary | ICD-10-CM

## 2021-03-21 DIAGNOSIS — M6281 Muscle weakness (generalized): Secondary | ICD-10-CM | POA: Diagnosis not present

## 2021-03-21 NOTE — Telephone Encounter (Signed)
Submitted VOB for Monovisc, right knee. BV pending.

## 2021-03-21 NOTE — Telephone Encounter (Signed)
Pt called for an update about right knee gel injection  and was it submitted to insurance company. Please call pt about this matter at [phone].

## 2021-03-21 NOTE — Telephone Encounter (Signed)
Talked with patient concerning gel injection.  

## 2021-03-21 NOTE — Therapy (Signed)
[facility] [place]. [place], [place], [address] Phone: [phone]   Fax:  [phone]  Physical Therapy Treatment  Patient Details  Name: Gerald Carpenter MRN: [id number] Date of Birth: 1950-06-25 Referring Provider (PT): Frankey Shown   Encounter Date: 03/21/2021   PT End of Session - 03/21/21 0828     Visit Number 4    Date for PT Re-Evaluation 05/19/21    PT Start Time 0750    PT Stop Time 0835    PT Time Calculation (min) 45 min             Past Medical History:  Diagnosis Date   Arthritis    Complication of anesthesia    Family history of adverse reaction to anesthesia    daughter- N/V   Nephritis    when pt. was 7 yrs. ols   Peripheral vascular disease (HCC)    PONV (postoperative nausea and vomiting)    "Only when I have aortagram on 06/23/20".   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    Past Surgical History:  Procedure Laterality Date   ABDOMINAL AORTOGRAM W/LOWER EXTREMITY N/A 06/23/2020   Procedure: ABDOMINAL AORTOGRAM W/LOWER EXTREMITY;  Surgeon: [name], MD;  Location: [facility];  Service: Cardiovascular;  Laterality: N/A;   ANTERIOR LAT LUMBAR FUSION Right 02/05/2019   Procedure: ATTEMPTED ANTERIOR LATERAL INTERBODY FUSION;  Surgeon: Consuella Lose, MD;  Location: [facility];  Service: Neurosurgery;  Laterality: Right;  RIGHT LATERAL TRANSPSOAS DISCECTOMY AND FUSION WITH ROBOTIC ASSISTED PEDICLE SCREW STABILIZATION, LUMBAR 4- LUMBAR 5   COLONOSCOPY     ENDOVEIN HARVEST OF GREATER SAPHENOUS VEIN Left 06/29/2020   Procedure: HARVEST OF LEFT GREATER SAPHENOUS VEIN;  Surgeon: [name], MD;  Location: [facility];  Service: Vascular;  Laterality: Left;   EYE SURGERY Bilateral 2013   lens implants for cataracts after Lasik   FEMORAL-POPLITEAL BYPASS GRAFT Left 06/29/2020   Procedure: BYPASS GRAFT LEFT COMMON FEMORAL TO ABOVE KNEE POPLITEAL ARTERY;  Surgeon: [name], MD;  Location: [facility];  Service: Vascular;  Laterality: Left;   KNEE ARTHROSCOPY Right 1980   TONSILLECTOMY     at a very young age, but grew back   TOTAL HIP ARTHROPLASTY Right 05/25/2019   Procedure: RIGHT TOTAL HIP ARTHROPLASTY ANTERIOR APPROACH;  Surgeon: Leandrew Koyanagi, MD;  Location: [facility];  Service: Orthopedics;  Laterality: Right;    There were no vitals filed for this visit.   Subjective Assessment - 03/21/21 0751     Subjective mornings are always the roughest. going to call MD re: inj since its been a month    Currently in Pain? Yes    Pain Score 1     Pain Location Hip                               [facility] Adult PT Treatment/Exercise - 03/21/21 0001       Knee/Hip Exercises: Aerobic   Nustep L6 x6 LE only      Knee/Hip Exercises: Machines for Strengthening   Cybex Knee Extension 10lb 2x12    Cybex Knee Flexion 35lb 2x12    Cybex Leg Press 40# 10 reps 3 sets   feet 3 way for hips     Knee/Hip Exercises: Standing   Lateral Step Up 1 set;10 reps;Step Height: 6";Both;Hand Hold: 1   opp leg abd   Forward Step  Up Both;1 set;10 reps;Step Height: 6";Hand Hold: 1   opp hip ext   Walking with Sports Cord 30lb side step x5 each side      Knee/Hip Exercises: Seated   Other Seated Knee/Hip Exercises IR/ER RT hip 2 sets 10 3#      Knee/Hip Exercises: Supine   Bridges with Ball Squeeze Strengthening;Both;2 sets;10 reps    Straight Leg Raises Strengthening;Right;3 sets;10 reps   3# 1 set toes up, 1 set with ER, 1 set with abd     Knee/Hip Exercises: Sidelying   Hip ABduction Strengthening;Right;3 sets;10 reps   3# . 1 set stratight abd then CW and CCW                      PT Short Term Goals - 03/10/21 0954       PT SHORT TERM GOAL #1   Title independent with initial HEP    Baseline Initiated HEP    Time 4    Period Weeks    Status New    Target Date 04/07/21               PT Long Term Goals - 03/10/21 0954        PT LONG TERM GOAL #1   Title I with final HEP    Time 10    Period Weeks    Status New    Target Date 05/19/21      PT LONG TERM GOAL #2   Title Decrease pain in R hip to </+2/10 in sit, stand, or ambulation x at least 1000'.    Baseline Constant at level 4-5    Time 10    Period Weeks    Status New    Target Date 05/19/21      PT LONG TERM GOAL #3   Title Increase B hip pain to 4/5 throughout all planes.    Baseline 3+/5    Time 10    Period Weeks    Status New    Target Date 05/19/21      PT LONG TERM GOAL #4   Title Patient will ambulate with normal movement pattern, no antalgic compensations, iwth DGI score of at least 20/24 to demonstrate decreased fall risk.    Baseline DGI- TBD    Time 10    Period Weeks    Status New    Target Date 05/19/21                   Plan - 03/21/21 3154     Clinical Impression Statement focus on muscle isolation with cuing to avoid compensations. no pain but fatigue and tightness. limited IR/ER of RT hip. fatigue noted and brief rest gien btwn ex as needed. modified as needed to not increase RT knee pin    PT Treatment/Interventions ADLs/Self Care Home Management;Iontophoresis 4mg /ml Dexamethasone;Gait training;Stair training;Functional mobility training;Therapeutic activities;Therapeutic exercise;Moist Heat;Balance training;Neuromuscular re-education;Manual techniques;Dry needling;Vasopneumatic Device;Patient/family education;Electrical Stimulation;Cryotherapy    PT Next Visit Plan assess goals and progress             Patient will benefit from skilled therapeutic intervention in order to improve the following deficits and impairments:  Abnormal gait, Difficulty walking, Increased fascial restricitons, Decreased endurance, Increased muscle spasms, Decreased activity tolerance, Pain, Decreased balance, Impaired flexibility, Decreased strength, Postural dysfunction, Decreased range of motion, Improper body mechanics  Visit  Diagnosis: Muscle weakness (generalized)  Difficulty in walking, not elsewhere classified     Problem List Patient  Active Problem List   Diagnosis Date Noted   Leukocytosis 10/11/2020   PAD (peripheral artery disease) ([place]) 06/29/2020   Prediabetes 04/22/2020   Chronic kidney disease, stage 3b ([place]) 04/15/2020   Tobacco dependence due to cigarettes 04/12/2020   Lower urinary tract symptoms (LUTS) 04/12/2020   History of colon polyps 04/12/2020   Atherosclerosis of native arteries of extremity with intermittent claudication ([place]) 03/01/2020   Pain in both feet 07/31/2019   Primary osteoarthritis of right knee 07/07/2019   Status post total replacement of right hip 05/25/2019   Lumbar radiculopathy 02/05/2019    Vania Rosero,ANGIE, PTA 03/21/2021, 8:31 AM  [place]. [place], [place], [address] Phone: [phone]   Fax:  [phone]  Name: Azeem Poorman MRN: [id number] Date of Birth: 23-Dec-1950

## 2021-03-22 ENCOUNTER — Telehealth: Payer: Self-pay

## 2021-03-22 NOTE — Telephone Encounter (Signed)
Approved for Monovisc, right knee [employer] Deductible has been met [employer], will pick up eligible expenses at 100% No Co-pay No PA required  Appt. 04/04/2021 with Dr. Erlinda Hong.

## 2021-03-23 ENCOUNTER — Encounter: Payer: Self-pay | Admitting: Physical Therapy

## 2021-03-23 ENCOUNTER — Other Ambulatory Visit: Payer: Self-pay

## 2021-03-23 ENCOUNTER — Ambulatory Visit: Payer: Medicare Other | Attending: Orthopaedic Surgery | Admitting: Physical Therapy

## 2021-03-23 DIAGNOSIS — M25551 Pain in right hip: Secondary | ICD-10-CM | POA: Diagnosis present

## 2021-03-23 DIAGNOSIS — M6281 Muscle weakness (generalized): Secondary | ICD-10-CM | POA: Insufficient documentation

## 2021-03-23 DIAGNOSIS — R262 Difficulty in walking, not elsewhere classified: Secondary | ICD-10-CM | POA: Diagnosis present

## 2021-03-23 DIAGNOSIS — R2681 Unsteadiness on feet: Secondary | ICD-10-CM | POA: Diagnosis present

## 2021-03-23 NOTE — Therapy (Signed)
Gerald Carpenter. [place], [place], [address] Phone: [phone]   Fax:  [phone]  Physical Therapy Treatment  Patient Details  Name: Gerald Carpenter MRN: [id number] Date of Birth: [birth year] Referring Provider (PT): Frankey Shown   Encounter Date: 03/23/2021   PT End of Session - 03/23/21 0839     Visit Number 5    Date for PT Re-Evaluation 05/19/21    PT Start Time 0758    PT Stop Time 0840    PT Time Calculation (min) 42 min    Activity Tolerance Patient tolerated treatment well    Behavior During Therapy [facility] for tasks assessed/performed             Past Medical History:  Diagnosis Date   Arthritis    Complication of anesthesia    Family history of adverse reaction to anesthesia    daughter- N/V   Nephritis    when pt. was 7 yrs. ols   Peripheral vascular disease (HCC)    PONV (postoperative nausea and vomiting)    "Only when I have aortagram on 06/23/20".   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    Past Surgical History:  Procedure Laterality Date   ABDOMINAL AORTOGRAM W/LOWER EXTREMITY N/A 06/23/2020   Procedure: ABDOMINAL AORTOGRAM W/LOWER EXTREMITY;  Surgeon: [name], MD;  Location: [place] [facility];  Service: Cardiovascular;  Laterality: N/A;   ANTERIOR LAT LUMBAR FUSION Right 02/05/2019   Procedure: ATTEMPTED ANTERIOR LATERAL INTERBODY FUSION;  Surgeon: Consuella Lose, MD;  Location: [place];  Service: Neurosurgery;  Laterality: Right;  RIGHT LATERAL TRANSPSOAS DISCECTOMY AND FUSION WITH ROBOTIC ASSISTED PEDICLE SCREW STABILIZATION, LUMBAR 4- LUMBAR 5   COLONOSCOPY     ENDOVEIN HARVEST OF GREATER SAPHENOUS VEIN Left 06/29/2020   Procedure: HARVEST OF LEFT GREATER SAPHENOUS VEIN;  Surgeon: [name], MD;  Location: [place];  Service: Vascular;  Laterality: Left;   EYE SURGERY Bilateral 2013   lens implants for cataracts after Lasik   FEMORAL-POPLITEAL BYPASS GRAFT Left  06/29/2020   Procedure: BYPASS GRAFT LEFT COMMON FEMORAL TO ABOVE KNEE POPLITEAL ARTERY;  Surgeon: [name], MD;  Location: [place];  Service: Vascular;  Laterality: Left;   KNEE ARTHROSCOPY Right 1980   TONSILLECTOMY     at a very young age, but grew back   TOTAL HIP ARTHROPLASTY Right 05/25/2019   Procedure: RIGHT TOTAL HIP ARTHROPLASTY ANTERIOR APPROACH;  Surgeon: Leandrew Koyanagi, MD;  Location: [place];  Service: Orthopedics;  Laterality: Right;    There were no vitals filed for this visit.   Subjective Assessment - 03/23/21 0758     Subjective Doing ok, just stiff.    Currently in Pain? Yes    Pain Score 4     Pain Location Knee    Pain Orientation Right                               [facility] Adult PT Treatment/Exercise - 03/23/21 0001       Knee/Hip Exercises: Aerobic   Recumbent Bike L3 x 6 min      Knee/Hip Exercises: Machines for Strengthening   Cybex Knee Extension 10lb 2x12    Cybex Knee Flexion 35lb 2x12    Cybex Leg Press 50# 10 reps 3 sets      Knee/Hip Exercises: Standing   Heel Raises Both;2 sets;10 reps    Lateral Step Up  Both;1 set;10 reps;Hand Hold: 0   4in box on airex   Forward Step Up Both;1 set;10 reps;Hand Hold: 0   4in box on airex   Walking with Sports Cord 30lb side step over half foam roll x10 each side      Knee/Hip Exercises: Supine   Bridges with Greig Right Strengthening;Both;2 sets;15 reps    Straight Leg Raises Strengthening;Both;2 sets;5 reps    Straight Leg Raises Limitations 3                       PT Short Term Goals - 03/23/21 5697       PT SHORT TERM GOAL #1   Title independent with initial HEP    Status Achieved               PT Long Term Goals - 03/23/21 0839       PT LONG TERM GOAL #1   Title I with final HEP    Status On-going      PT LONG TERM GOAL #2   Title Decrease pain in R hip to </+2/10 in sit, stand, or ambulation x at least 1000'.    Status Partially Met      PT  LONG TERM GOAL #3   Title Increase B hip pain to 4/5 throughout all planes.    Status On-going                   Plan - 03/23/21 0840     Clinical Impression Statement Progressed with functional interventions that challenged both strength and balance. Some R knee pain reported with resisted side steps. instability noted with step ups while having box on airex. Pt has a difficult time completing SLR with 3lb resistance.    Personal Factors and Comorbidities Age;Past/Current Experience;Comorbidity 2    Examination-Activity Limitations Bathing;Locomotion Level;Bend;Sleep;Squat;Dressing;Stand    Examination-Participation Restrictions Driving;Community Activity    Stability/Clinical Decision Making Evolving/Moderate complexity    Rehab Potential Good    PT Frequency 2x / week    PT Treatment/Interventions ADLs/Self Care Home Management;Iontophoresis 3m/ml Dexamethasone;Gait training;Stair training;Functional mobility training;Therapeutic activities;Therapeutic exercise;Moist Heat;Balance training;Neuromuscular re-education;Manual techniques;Dry needling;Vasopneumatic Device;Patient/family education;Electrical Stimulation;Cryotherapy    PT Next Visit Plan assess goals and progress             Patient will benefit from skilled therapeutic intervention in order to improve the following deficits and impairments:  Abnormal gait, Difficulty walking, Increased fascial restricitons, Decreased endurance, Increased muscle spasms, Decreased activity tolerance, Pain, Decreased balance, Impaired flexibility, Decreased strength, Postural dysfunction, Decreased range of motion, Improper body mechanics  Visit Diagnosis: Muscle weakness (generalized)  Difficulty in walking, not elsewhere classified  Pain in right hip  Unsteadiness on feet     Problem List Patient Active Problem List   Diagnosis Date Noted   Leukocytosis 10/11/2020   PAD (peripheral artery disease) ([place] 06/29/2020    Prediabetes 04/22/2020   Chronic kidney disease, stage 3b ([place] 04/15/2020   Tobacco dependence due to cigarettes 04/12/2020   Lower urinary tract symptoms (LUTS) 04/12/2020   History of colon polyps 04/12/2020   Atherosclerosis of native arteries of extremity with intermittent claudication ([place] 03/01/2020   Pain in both feet 07/31/2019   Primary osteoarthritis of right knee 07/07/2019   Status post total replacement of right hip 05/25/2019   Lumbar radiculopathy 02/05/2019    RScot Jun PTA 03/23/2021, 8:42 AM  [place]  [address] Phone: [phone]   Fax:  [phone]  Name: Gerald Carpenter MRN: [id number] Date of Birth: Sep 28, 1950

## 2021-03-28 ENCOUNTER — Other Ambulatory Visit: Payer: Self-pay

## 2021-03-28 ENCOUNTER — Ambulatory Visit: Payer: Medicare Other | Admitting: Physical Therapy

## 2021-03-28 DIAGNOSIS — M25551 Pain in right hip: Secondary | ICD-10-CM

## 2021-03-28 DIAGNOSIS — M6281 Muscle weakness (generalized): Secondary | ICD-10-CM | POA: Diagnosis not present

## 2021-03-28 NOTE — Therapy (Signed)
[place]. [place], [place], [address] Phone: [phone]   Fax:  [phone]  Physical Therapy Treatment  Patient Details  Name: Gerald Carpenter MRN: [id number] Date of Birth: 1950-07-21 Referring Provider (PT): Frankey Shown   Encounter Date: 03/28/2021   PT End of Session - 03/28/21 0920     Visit Number 6    Number of Visits 20    Date for PT Re-Evaluation 05/19/21    PT Start Time 0840    PT Stop Time 0927    PT Time Calculation (min) 47 min             Past Medical History:  Diagnosis Date   Arthritis    Complication of anesthesia    Family history of adverse reaction to anesthesia    daughter- N/V   Nephritis    when pt. was 7 yrs. ols   Peripheral vascular disease (HCC)    PONV (postoperative nausea and vomiting)    "Only when I have aortagram on 06/23/20".   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    Past Surgical History:  Procedure Laterality Date   ABDOMINAL AORTOGRAM W/LOWER EXTREMITY N/A 06/23/2020   Procedure: ABDOMINAL AORTOGRAM W/LOWER EXTREMITY;  Surgeon: [name], MD;  Location: [place] [facility];  Service: Cardiovascular;  Laterality: N/A;   ANTERIOR LAT LUMBAR FUSION Right 02/05/2019   Procedure: ATTEMPTED ANTERIOR LATERAL INTERBODY FUSION;  Surgeon: Consuella Lose, MD;  Location: [place];  Service: Neurosurgery;  Laterality: Right;  RIGHT LATERAL TRANSPSOAS DISCECTOMY AND FUSION WITH ROBOTIC ASSISTED PEDICLE SCREW STABILIZATION, LUMBAR 4- LUMBAR 5   COLONOSCOPY     ENDOVEIN HARVEST OF GREATER SAPHENOUS VEIN Left 06/29/2020   Procedure: HARVEST OF LEFT GREATER SAPHENOUS VEIN;  Surgeon: [name], MD;  Location: [place];  Service: Vascular;  Laterality: Left;   EYE SURGERY Bilateral 2013   lens implants for cataracts after Lasik   FEMORAL-POPLITEAL BYPASS GRAFT Left 06/29/2020   Procedure: BYPASS GRAFT LEFT COMMON FEMORAL TO ABOVE KNEE POPLITEAL ARTERY;   Surgeon: [name], MD;  Location: [place];  Service: Vascular;  Laterality: Left;   KNEE ARTHROSCOPY Right 1980   TONSILLECTOMY     at a very young age, but grew back   TOTAL HIP ARTHROPLASTY Right 05/25/2019   Procedure: RIGHT TOTAL HIP ARTHROPLASTY ANTERIOR APPROACH;  Surgeon: Leandrew Koyanagi, MD;  Location: [place];  Service: Orthopedics;  Laterality: Right;    There were no vitals filed for this visit.   Subjective Assessment - 03/28/21 0845     Subjective always wake up stiff. I think most of my issue is steming form my knee. Inj 2/14    Currently in Pain? Yes    Pain Score 2                                [facility] Adult PT Treatment/Exercise - 03/28/21 0001       Knee/Hip Exercises: Aerobic   Tread Mill SW 1.5 min each way    Nustep L 5 6 min LE only      Knee/Hip Exercises: Machines for Strengthening   Cybex Knee Extension 10lb 2x15    Cybex Knee Flexion 35lb 2x15    Cybex Leg Press 50# 12reps 3 sets   feet 3 way   Other Machine calf raises on leg press 2 sets 15 50#  Knee/Hip Exercises: Standing   Hip Flexion Stengthening;Both;2 sets;10 reps;Knee straight   green tband   Hip Abduction Stengthening;Both;2 sets;10 reps    Abduction Limitations Green Theraband    Hip Extension Stengthening;Both;2 sets;10 reps    Extension Limitations green tband    Lateral Step Up Both;1 set;10 reps;Hand Hold: 0   4 inch box on airex   Forward Step Up Both;1 set;10 reps;Hand Hold: 0   4 in box on airex     Knee/Hip Exercises: Seated   Knee/Hip Flexion 15x with green tband 0nto abd on 6 in step    Sit to Sand 2 sets;10 reps;without UE support      Knee/Hip Exercises: Supine   Quad Sets --   green band at knees maintaining tension                      PT Short Term Goals - 03/23/21 0839       PT SHORT TERM GOAL #1   Title independent with initial HEP    Status Achieved               PT Long Term Goals - 03/23/21 0839       PT  LONG TERM GOAL #1   Title I with final HEP    Status On-going      PT LONG TERM GOAL #2   Title Decrease pain in R hip to </+2/10 in sit, stand, or ambulation x at least 1000'.    Status Partially Met      PT LONG TERM GOAL #3   Title Increase B hip pain to 4/5 throughout all planes.    Status On-going                   Plan - 03/28/21 7425     Clinical Impression Statement continue to focus on strength of LE, knee pain is limiting pt - scheduled gel inj 2/14. cuing with ex fo rspeed, control and quality of motion    PT Treatment/Interventions ADLs/Self Care Home Management;Iontophoresis 44m/ml Dexamethasone;Gait training;Stair training;Functional mobility training;Therapeutic activities;Therapeutic exercise;Moist Heat;Balance training;Neuromuscular re-education;Manual techniques;Dry needling;Vasopneumatic Device;Patient/family education;Electrical Stimulation;Cryotherapy    PT Next Visit Plan reassess goals as pt will not return for 2 weeks d/t colonscopy and hemroid surgery             Patient will benefit from skilled therapeutic intervention in order to improve the following deficits and impairments:  Abnormal gait, Difficulty walking, Increased fascial restricitons, Decreased endurance, Increased muscle spasms, Decreased activity tolerance, Pain, Decreased balance, Impaired flexibility, Decreased strength, Postural dysfunction, Decreased range of motion, Improper body mechanics  Visit Diagnosis: Muscle weakness (generalized)  Pain in right hip     Problem List Patient Active Problem List   Diagnosis Date Noted   Leukocytosis 10/11/2020   PAD (peripheral artery disease) ([facility] 06/29/2020   Prediabetes 04/22/2020   Chronic kidney disease, stage 3b ([facility] 04/15/2020   Tobacco dependence due to cigarettes 04/12/2020   Lower urinary tract symptoms (LUTS) 04/12/2020   History of colon polyps 04/12/2020   Atherosclerosis of native arteries of extremity with  intermittent claudication ([facility] 03/01/2020   Pain in both feet 07/31/2019   Primary osteoarthritis of right knee 07/07/2019   Status post total replacement of right hip 05/25/2019   Lumbar radiculopathy 02/05/2019    Makaila Windle,ANGIE, PTA 03/28/2021, 9:21 AM  [place] [address]Phone: [phone]  Fax:  [phone]  Name: Gerald Carpenter MRN: [id number] Date of Birth: 03-30-1950

## 2021-03-29 ENCOUNTER — Encounter (HOSPITAL_BASED_OUTPATIENT_CLINIC_OR_DEPARTMENT_OTHER): Payer: Self-pay | Admitting: Surgery

## 2021-03-29 ENCOUNTER — Ambulatory Visit: Payer: Self-pay | Admitting: Surgery

## 2021-03-29 ENCOUNTER — Other Ambulatory Visit: Payer: Self-pay

## 2021-03-29 NOTE — Progress Notes (Signed)
Spoke w/ via phone for pre-op interview: patient Lab needs dos: NA Lab results: EKG 06/23/20 COVID test: patient states asymptomatic no test needed. Arrive at 1:00 pm NPO after MN except clear liquids.Clear liquids from MN until noon. Med rec completed. Medications to take morning of surgery: Tamsulosin only. Follow surgeon instructions regarding holding aspirin. Diabetic medication: NA Patient instructed no nail polish to be worn day of surgery. Patient instructed to bring photo id and insurance card day of surgery. Patient aware to have Driver (ride ) / caregiver for 24 hours after surgery. Patient Special Instructions: NA Pre-Op special Istructions: NS for stage 3 CKD Patient verbalized understanding of instructions that were given at this phone interview. Patient denies shortness of breath, chest pain, fever, cough at this phone interview.

## 2021-03-30 ENCOUNTER — Encounter: Payer: Self-pay | Admitting: Physical Therapy

## 2021-03-30 ENCOUNTER — Ambulatory Visit: Payer: Medicare Other | Admitting: Physical Therapy

## 2021-03-30 DIAGNOSIS — M6281 Muscle weakness (generalized): Secondary | ICD-10-CM | POA: Diagnosis not present

## 2021-03-30 DIAGNOSIS — R262 Difficulty in walking, not elsewhere classified: Secondary | ICD-10-CM

## 2021-03-30 DIAGNOSIS — R2681 Unsteadiness on feet: Secondary | ICD-10-CM

## 2021-03-30 DIAGNOSIS — M25551 Pain in right hip: Secondary | ICD-10-CM

## 2021-03-30 NOTE — Therapy (Signed)
[place]. [place], [place], [address] Phone: [phone]   Fax:  [phone]  Physical Therapy Treatment  Patient Details  Name: Gerald Carpenter MRN: [id number] Date of Birth: 09/09/1950 Referring Provider (PT): Frankey Shown   Encounter Date: 03/30/2021   PT End of Session - 03/30/21 0841     Visit Number 7    Number of Visits 20    Date for PT Re-Evaluation 05/19/21    PT Start Time 0750    PT Stop Time 0836    PT Time Calculation (min) 46 min    Activity Tolerance Patient tolerated treatment well    Behavior During Therapy [facility] for tasks assessed/performed             Past Medical History:  Diagnosis Date   Arthritis    Complication of anesthesia    Nephritis    when pt. was 7 yrs. ols   Peripheral vascular disease (HCC)    PONV (postoperative nausea and vomiting)    "Only when I have aortagram on 06/23/20".   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    Past Surgical History:  Procedure Laterality Date   ABDOMINAL AORTOGRAM W/LOWER EXTREMITY N/A 06/23/2020   Procedure: ABDOMINAL AORTOGRAM W/LOWER EXTREMITY;  Surgeon: [name], MD;  Location: [place] [facility];  Service: Cardiovascular;  Laterality: N/A;   ANTERIOR LAT LUMBAR FUSION Right 02/05/2019   Procedure: ATTEMPTED ANTERIOR LATERAL INTERBODY FUSION;  Surgeon: Consuella Lose, MD;  Location: [place];  Service: Neurosurgery;  Laterality: Right;  RIGHT LATERAL TRANSPSOAS DISCECTOMY AND FUSION WITH ROBOTIC ASSISTED PEDICLE SCREW STABILIZATION, LUMBAR 4- LUMBAR 5   COLONOSCOPY     ENDOVEIN HARVEST OF GREATER SAPHENOUS VEIN Left 06/29/2020   Procedure: HARVEST OF LEFT GREATER SAPHENOUS VEIN;  Surgeon: [name], MD;  Location: [place];  Service: Vascular;  Laterality: Left;   EYE SURGERY Bilateral 2013   lens implants for cataracts after Lasik   FEMORAL-POPLITEAL BYPASS GRAFT Left 06/29/2020   Procedure: BYPASS GRAFT LEFT  COMMON FEMORAL TO ABOVE KNEE POPLITEAL ARTERY;  Surgeon: [name], MD;  Location: [place];  Service: Vascular;  Laterality: Left;   KNEE ARTHROSCOPY Right 1980   TONSILLECTOMY     at a very young age, but grew back   TOTAL HIP ARTHROPLASTY Right 05/25/2019   Procedure: RIGHT TOTAL HIP ARTHROPLASTY ANTERIOR APPROACH;  Surgeon: Leandrew Koyanagi, MD;  Location: [place];  Service: Orthopedics;  Laterality: Right;    There were no vitals filed for this visit.   Subjective Assessment - 03/30/21 0751     Subjective Patient arrived limping. Reports his L lower leg numbness from his bypass surgery an dhis R knee is bothering him.    Pertinent History L fem-pop bypass graft, Lumbar fusion, R THR,    Currently in Pain? Yes    Pain Score 3     Pain Location Knee    Pain Orientation Right    Pain Descriptors / Indicators Constant;Aching    Pain Onset More than a month ago    Pain Frequency Constant                [facility] PT Assessment - 03/30/21 0001       Strength   Right Hip Flexion 4-/5    Right Hip Extension 4-/5    Right Hip External Rotation  4-/5    Right Hip Internal Rotation 4-/5    Right Hip ABduction 4-/5  Ambulation/Gait   Ambulation/Gait No      Dynamic Gait Index   Level Surface Mild Impairment    Change in Gait Speed Mild Impairment    Gait with Horizontal Head Turns Normal    Gait with Vertical Head Turns Normal    Gait and Pivot Turn Normal    Step Over Obstacle Normal    Step Around Obstacles Mild Impairment    Steps Normal    Total Score 21      Functional Gait  Assessment   Gait assessed  Yes    Gait Level Surface Walks 20 ft in less than 7 sec but greater than 5.5 sec, uses assistive device, slower speed, mild gait deviations, or deviates 6-10 in outside of the 12 in walkway width.    Change in Gait Speed Able to smoothly change walking speed without loss of balance or gait deviation. Deviate no more than 6 in outside of the 12 in walkway width.     Gait with Horizontal Head Turns Performs head turns smoothly with no change in gait. Deviates no more than 6 in outside 12 in walkway width    Gait with Vertical Head Turns Performs head turns with no change in gait. Deviates no more than 6 in outside 12 in walkway width.    Gait and Pivot Turn Pivot turns safely within 3 sec and stops quickly with no loss of balance.    Step Over Obstacle Is able to step over 2 stacked shoe boxes taped together (9 in total height) without changing gait speed. No evidence of imbalance.    Gait with Narrow Base of Support Ambulates 7-9 steps.    Gait with Eyes Closed Walks 20 ft, uses assistive device, slower speed, mild gait deviations, deviates 6-10 in outside 12 in walkway width. Ambulates 20 ft in less than 9 sec but greater than 7 sec.    Ambulating Backwards Walks 20 ft, uses assistive device, slower speed, mild gait deviations, deviates 6-10 in outside 12 in walkway width.    Steps Alternating feet, no rail.    Total Score 26                           [facility] Adult PT Treatment/Exercise - 03/30/21 0001       Knee/Hip Exercises: Aerobic   Nustep L 5 x 6 min LE only      Knee/Hip Exercises: Machines for Strengthening   Cybex Knee Extension 10lb 2x15    Cybex Knee Flexion 35lb 2x15      Knee/Hip Exercises: Standing   Walking with Sports Cord 30lb side step over half foam roll x10 each side      Knee/Hip Exercises: Supine   Straight Leg Raises Strengthening;Right;1 set;10 reps    Straight Leg Raise with External Rotation Strengthening;Right;1 set;10 reps      Knee/Hip Exercises: Sidelying   Hip ABduction Strengthening;Right;1 set;5 reps    Hip ABduction Limitations Patient held limb in abd while slowly moving hip into flexion and ext with a tap on the mat at each extreme.                     PT Education - 03/30/21 0843     Education Details HEP updated, patient encouraged to continue to participate as tolerated  after his procedures. Sideying hip abd with swing forward and back, SLR with ER.    Person(s) Educated Patient    Methods Explanation;Demonstration  declined handout for new exercises.   Comprehension Verbalized understanding;Returned demonstration              PT Short Term Goals - 03/23/21 0839       PT SHORT TERM GOAL #1   Title independent with initial HEP    Status Achieved               PT Long Term Goals - 03/30/21 0813       PT LONG TERM GOAL #1   Title I with final HEP    Time 8    Period Weeks    Status On-going    Target Date 05/19/21      PT LONG TERM GOAL #2   Title Decrease pain in R hip to </+2/10 in sit, stand, or ambulation x at least 1000'.    Time 8    Period Weeks    Status Achieved      PT LONG TERM GOAL #3   Title Increase B hip strength to 4/5 throughout all planes.    Baseline 4-/5    Time 8    Period Weeks    Status On-going    Target Date 05/19/21      PT LONG TERM GOAL #4   Title Patient will ambulate with normal movement pattern, no antalgic compensations, iwth DGI score of at least 20/24 to demonstrate decreased fall risk.    Baseline DGI 21- However, patient continues to demosntrate antalgic gait.    Time 8    Period Weeks    Status On-going    Target Date 05/19/21      PT LONG TERM GOAL #5   Title incresare SLR on the right to 70 degrees    Period Weeks                   Plan - 03/30/21 0810     Clinical Impression Statement Treatmetn focused on LE strengthening, especially hips. Patient progressing, but again, R knee pain limits some activities. Functional re-assessment performed as patient is going to have some procedures and will be on hold for approximatley 2 1/2 weeks.    Personal Factors and Comorbidities Age;Past/Current Experience;Comorbidity 2    Comorbidities arthritis, PVD    Examination-Activity Limitations Bathing;Locomotion Level;Bend;Sleep;Squat;Dressing;Stand    Examination-Participation  Restrictions Driving;Community Activity    Stability/Clinical Decision Making Evolving/Moderate complexity    Clinical Decision Making Moderate    Rehab Potential Good    PT Frequency 2x / week    PT Duration 8 weeks    PT Treatment/Interventions ADLs/Self Care Home Management;Iontophoresis 4mg /ml Dexamethasone;Gait training;Stair training;Functional mobility training;Therapeutic activities;Therapeutic exercise;Moist Heat;Balance training;Neuromuscular re-education;Manual techniques;Dry needling;Vasopneumatic Device;Patient/family education;Electrical Stimulation;Cryotherapy    PT Next Visit Plan reassess goals as pt will not return for 2 weeks d/t colonscopy and hemroid surgery    PT Home Exercise Plan [id number]    Consulted and Agree with Plan of Care Patient             Patient will benefit from skilled therapeutic intervention in order to improve the following deficits and impairments:  Abnormal gait, Difficulty walking, Increased fascial restricitons, Decreased endurance, Increased muscle spasms, Decreased activity tolerance, Pain, Decreased balance, Impaired flexibility, Decreased strength, Postural dysfunction, Decreased range of motion, Improper body mechanics  Visit Diagnosis: Muscle weakness (generalized)  Pain in right hip  Difficulty in walking, not elsewhere classified  Unsteadiness on feet     Problem List Patient Active Problem List   Diagnosis Date Noted  Leukocytosis 10/11/2020   PAD (peripheral artery disease) ([place]) 06/29/2020   Prediabetes 04/22/2020   Chronic kidney disease, stage 3b ([place]) 04/15/2020   Tobacco dependence due to cigarettes 04/12/2020   Lower urinary tract symptoms (LUTS) 04/12/2020   History of colon polyps 04/12/2020   Atherosclerosis of native arteries of extremity with intermittent claudication ([place]) 03/01/2020   Pain in both feet 07/31/2019   Primary osteoarthritis of right knee 07/07/2019   Status post total replacement of right hip  05/25/2019   Lumbar radiculopathy 02/05/2019    Marcelina Morel, DPT 03/30/2021, 8:45 AM  [place]. [place], [place], [address] Phone: [phone]   Fax:  [phone]  Name: Gerald Carpenter MRN: [id number] Date of Birth: 28-Dec-1950

## 2021-03-31 ENCOUNTER — Encounter: Payer: Self-pay | Admitting: Gastroenterology

## 2021-03-31 ENCOUNTER — Ambulatory Visit (INDEPENDENT_AMBULATORY_CARE_PROVIDER_SITE_OTHER): Payer: Medicare Other | Admitting: Gastroenterology

## 2021-03-31 VITALS — BP 100/64 | HR 84 | Ht 65.25 in | Wt 158.5 lb

## 2021-03-31 DIAGNOSIS — K644 Residual hemorrhoidal skin tags: Secondary | ICD-10-CM | POA: Diagnosis not present

## 2021-03-31 DIAGNOSIS — Z86010 Personal history of colonic polyps: Secondary | ICD-10-CM | POA: Diagnosis not present

## 2021-03-31 NOTE — Progress Notes (Signed)
Referring Provider: Ileana Roup, MD Primary Care Physician:  Haydee Salter, MD  Reason for Consultation:  Hemorrhoids, colon cancer screening   IMPRESSION:  External hemorrhoids with residual skin tags with plans for hemorrhoidectomy next week Personal history of colon polyps on colonoscopy in [place] No known family history of colon cancer or polyps   PLAN: See patient instructions for details re: hemorrhoids Surveillance colonoscopy Attempt to obtain colonoscopy report from [place]  HPI: Gerald Carpenter is a 71 y.o. male referred by Dr. Dema Severin for colon cancer screening and hemorrhoids. He has a history of PAD, HLD, and hemorrhoids.  He is a retired Clinical biochemist. Recently moved from [place] to be closer to his brother.  Recently seen by Dr. Dema Severin for hemorrhoidal bleeding. Anoscopy was done to the right showed residual skin tags large decompressed external hemorrhoidal tags that are giving him issues with hygiene.  Hemorrhoidectomy planned.  Colonoscopy recommended.  He has one formed bowel movement daily. GI ROS negative.   He had a colonoscopy in [place] 3 years ago. He had 2 polyps removed at that time. Surveillance recommended in 5 years. Surgeon is recommended colonoscopy prior to surgery.  He had no issues with the colon prep, procedure, anesthesia, or recovery.   There is no known family history of colon cancer or polyps. No family history of stomach cancer or other GI malignancy. No family history of inflammatory bowel disease or celiac.    Past Medical History:  Diagnosis Date   Alcoholism ([place])    Arthritis    Colon polyps    Complication of anesthesia    HLD (hyperlipidemia)    Nephritis    when pt. was 7 yrs. ols   Peripheral vascular disease (HCC)    PONV (postoperative nausea and vomiting)    "Only when I have aortagram on 06/23/20".   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    Past Surgical History:  Procedure  Laterality Date   ABDOMINAL AORTOGRAM W/LOWER EXTREMITY N/A 06/23/2020   Procedure: ABDOMINAL AORTOGRAM W/LOWER EXTREMITY;  Surgeon: [name], MD;  Location: [facility];  Service: Cardiovascular;  Laterality: N/A;   ANTERIOR LAT LUMBAR FUSION Right 02/05/2019   Procedure: ATTEMPTED ANTERIOR LATERAL INTERBODY FUSION;  Surgeon: Consuella Lose, MD;  Location: [facility];  Service: Neurosurgery;  Laterality: Right;  RIGHT LATERAL TRANSPSOAS DISCECTOMY AND FUSION WITH ROBOTIC ASSISTED PEDICLE SCREW STABILIZATION, LUMBAR 4- LUMBAR 5   CATARACT EXTRACTION W/ INTRAOCULAR LENS  IMPLANT, BILATERAL Bilateral 2013   lens implants for cataracts after Lasik   COLONOSCOPY     ENDOVEIN HARVEST OF GREATER SAPHENOUS VEIN Left 06/29/2020   Procedure: HARVEST OF LEFT GREATER SAPHENOUS VEIN;  Surgeon: [name], MD;  Location: [facility];  Service: Vascular;  Laterality: Left;   FEMORAL-POPLITEAL BYPASS GRAFT Left 06/29/2020   Procedure: BYPASS GRAFT LEFT COMMON FEMORAL TO ABOVE KNEE POPLITEAL ARTERY;  Surgeon: [name], MD;  Location: [facility];  Service: Vascular;  Laterality: Left;   KNEE ARTHROSCOPY Right 1980   TONSILLECTOMY     at a very young age, but grew back   TOTAL HIP ARTHROPLASTY Right 05/25/2019   Procedure: RIGHT TOTAL HIP ARTHROPLASTY ANTERIOR APPROACH;  Surgeon: Leandrew Koyanagi, MD;  Location: [facility];  Service: Orthopedics;  Laterality: Right;    Prior to Admission medications   Medication Sig Start Date End Date Taking? Authorizing Provider  aspirin EC 81 MG tablet Take 1 tablet (81 mg total) by mouth daily. Swallow whole.  06/23/20 06/23/21  [name], MD  atorvastatin (LIPITOR) 40 MG tablet Take 1 tablet (40 mg total) by mouth daily. 10/19/20   Dagoberto Ligas, PA-C  fluticasone (FLONASE) 50 MCG/ACT nasal spray Place 2 sprays into both nostrils in the morning. 06/02/20   [provider]  methylPREDNISolone (MEDROL DOSEPAK) 4 MG TBPK tablet Take as  directed 02/28/21   Leandrew Koyanagi, MD  naltrexone (DEPADE) 50 MG tablet Take 1 tablet (50 mg total) by mouth daily. 11/17/20   Raulkar, Clide Deutscher, MD  tamsulosin (FLOMAX) 0.4 MG CAPS capsule Take 1 capsule (0.4 mg total) by mouth in the morning. 07/11/20   Haydee Salter, MD    Current Outpatient Medications  Medication Sig Dispense Refill   atorvastatin (LIPITOR) 40 MG tablet Take 1 tablet (40 mg total) by mouth daily. 90 tablet 3   tamsulosin (FLOMAX) 0.4 MG CAPS capsule Take 1 capsule (0.4 mg total) by mouth in the morning. 90 capsule 3   aspirin EC 81 MG tablet Take 1 tablet (81 mg total) by mouth daily. Swallow whole. (Patient not taking: Reported on 03/31/2021) 150 tablet 2   fluticasone (FLONASE) 50 MCG/ACT nasal spray Place 2 sprays into both nostrils in the morning. (Patient not taking: Reported on 03/31/2021)     No current facility-administered medications for this visit.    Allergies as of 03/31/2021 - Review Complete 03/31/2021  Allergen Reaction Noted   Anesthetics, amide Nausea And Vomiting 07/11/2020    Family History  Problem Relation Age of Onset   Pulmonary fibrosis Mother    Breast cancer Mother    Hyperlipidemia Mother    Hypertension Mother    Other Mother        Scarlet Fever   Heart disease Father    Hyperlipidemia Father    Diabetes Brother    Hypertension Brother    Prostate cancer Paternal Grandfather    Breast cancer Maternal Aunt    Hypothyroidism Daughter     Social History   Socioeconomic History   Marital status: Married    Spouse name: Not on file   Number of children: 3   Years of education: Not on file   Highest education level: Not on file  Occupational History   Occupation: retired  Tobacco Use   Smoking status: Every Day    Packs/day: 0.25    Years: 53.00    Pack years: 13.25    Types: Cigarettes   Smokeless tobacco: Never   Tobacco comments:    had first cigarettes age  55, habit started at age 56  Vaping Use   Vaping Use:  Never used  Substance and Sexual Activity   Alcohol use: Not Currently    Comment: 05/10/1991   Drug use: Not Currently    Comment: prior usage of "speed"  when drinking   Sexual activity: Yes  Other Topics Concern   Not on file  Social History Narrative   Recently moved here from [facility], [place]   Social Determinants of Health   Financial Resource Strain: Low Risk    Difficulty of Paying Living Expenses: Not hard at all  Food Insecurity: No Food Insecurity   Worried About Charity fundraiser in the Last Year: Never true   Arboriculturist in the Last Year: Never true  Transportation Needs: No Transportation Needs   Lack of Transportation (Medical): No   Lack of Transportation (Non-Medical): No  Physical Activity: Inactive   Days of Exercise per Week:  0 days   Minutes of Exercise per Session: 0 min  Stress: No Stress Concern Present   Feeling of Stress : Not at all  Social Connections: Moderately Isolated   Frequency of Communication with Friends and Family: More than three times a week   Frequency of Social Gatherings with Friends and Family: Once a week   Attends Religious Services: Never   Marine scientist or Organizations: No   Attends Music therapist: Never   Marital Status: Married  Human resources officer Violence: Not At Risk   Fear of Current or Ex-Partner: No   Emotionally Abused: No   Physically Abused: No   Sexually Abused: No    Review of Systems: 12 system ROS is negative except as noted above.   Physical Exam: General:   Alert,  well-nourished, pleasant and cooperative in NAD Head:  Normocephalic and atraumatic. Eyes:  Sclera clear, no icterus.   Conjunctiva pink. Ears:  Normal auditory acuity. Nose:  No deformity, discharge,  or lesions. Mouth:  No deformity or lesions.   Neck:  Supple; no masses or thyromegaly. Lungs:  Clear throughout to auscultation.   No wheezes. Heart:  Regular rate and rhythm; no murmurs. Abdomen:  Soft,  nontender, nondistended, normal bowel sounds, no rebound or guarding. No hepatosplenomegaly.   Rectal:  Deferred  Msk:  Symmetrical. No boney deformities LAD: No inguinal or umbilical LAD Extremities:  No clubbing or edema. Neurologic:  Alert and  oriented x4;  grossly nonfocal Skin:  Intact without significant lesions or rashes. Psych:  Alert and cooperative. Normal mood and affect.    Gerald Keltz L. Tarri Glenn, MD, MPH 03/31/2021, 9:23 AM

## 2021-03-31 NOTE — Patient Instructions (Addendum)
It was a pleasure to meet you today.   I recommend a high fiber diet. Eat at least 30 grams of fiber in the form of fruit, vegetables, and bran fiber daily.   Please drink at least 1.5-2 liters of water every day.  Add a daily stool bulking agent with psyllium or methylcellulose every day.  I have recommended a colonoscopy for further evaluation of your bleeding.  We can time your procedure around your hemorrhoidectomy.   Please call with any questions or concerns prior to your colonoscopy.  Tips for colonoscopy:  - Stay well hydrated for 3-4 days prior to the exam. This reduces nausea and dehydration.  - To prevent skin/hemorrhoid irritation - prior to wiping, put A&Dointment or vaseline on the toilet paper. - Keep a towel or pad on the bed.  - Drink  64oz of clear liquids in the morning of prep day (prior to starting the prep) to be sure that there is enough fluid to flush the colon and stay hydrated!!!! This is in addition to the fluids required for preparation. - Use of a flavored hard candy, such as grape Anise Salvo, can counteract some of the flavor of the prep and may prevent some nausea.

## 2021-04-04 ENCOUNTER — Other Ambulatory Visit: Payer: Self-pay

## 2021-04-04 ENCOUNTER — Ambulatory Visit (INDEPENDENT_AMBULATORY_CARE_PROVIDER_SITE_OTHER): Payer: Medicare Other | Admitting: Orthopaedic Surgery

## 2021-04-04 ENCOUNTER — Encounter: Payer: Self-pay | Admitting: Orthopaedic Surgery

## 2021-04-04 DIAGNOSIS — M1711 Unilateral primary osteoarthritis, right knee: Secondary | ICD-10-CM

## 2021-04-04 NOTE — Progress Notes (Signed)
Office Visit Note   Patient: Gerald Carpenter           Date of Birth: [birth year]           MRN: [id number] Visit Date: 04/04/2021              Requested by: Haydee Salter, MD [place],  [place] [address] PCP: Haydee Salter, MD   Assessment & Plan: Visit Diagnoses:  1. Unilateral primary osteoarthritis, right knee     Plan: Impression is right knee osteoarthritis.  Today, we proceeded with right knee Monovisc injection.  He tolerated this well.  He will follow-up with [place] as needed.  Follow-Up Instructions: Return if symptoms worsen or fail to improve.   Orders:  Orders Placed This Encounter  Procedures   Large Joint Inj: R knee   No orders of the defined types were placed in this encounter.     Procedures: Large Joint Inj: R knee on 04/04/2021 8:31 AM Indications: pain Details: 22 G needle, anterolateral approach Medications: 2 mL lidocaine 1 %; 2 mL bupivacaine 0.25 %; 88 mg Hyaluronan 88 MG/4ML     Clinical Data: No additional findings.   Subjective: Chief Complaint  Patient presents with   Right Knee - Pain, Follow-up    HPI patient is a pleasant 71 year old gentleman who comes in today for right knee Monovisc injection.  He has had this in the past which provided approximately a year and a half relief.  Review of Systems as detailed in HPI.  All others reviewed and are negative.   Objective: Vital Signs: There were no vitals taken for this visit.  Physical Exam well-developed well-nourished gentleman in no acute distress.  Alert and oriented x3.  Ortho Exam stable right knee exam without effusion.  Specialty Comments:  No specialty comments available.  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 was 7 yrs. ols   Peripheral vascular disease (HCC)    PONV (postoperative nausea and vomiting)    "Only when I have aortagram on 06/23/20".   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    Family History  Problem Relation Age of Onset   Pulmonary fibrosis Mother    Breast cancer Mother    Hyperlipidemia Mother    Hypertension Mother    Other Mother        Scarlet Fever   Heart disease Father    Hyperlipidemia Father    Diabetes Brother    Hypertension Brother    Prostate cancer Paternal Grandfather    Breast cancer Maternal Aunt    Hypothyroidism Daughter     Past Surgical History:  Procedure Laterality Date  ABDOMINAL AORTOGRAM W/LOWER EXTREMITY N/A 06/23/2020   Procedure: ABDOMINAL AORTOGRAM W/LOWER EXTREMITY;  Surgeon: [name], MD;  Location: [facility];  Service: Cardiovascular;  Laterality: N/A;   ANTERIOR LAT LUMBAR FUSION Right 02/05/2019   Procedure: ATTEMPTED ANTERIOR LATERAL INTERBODY FUSION;  Surgeon: Consuella Lose, MD;  Location: [facility];  Service: Neurosurgery;  Laterality: Right;  RIGHT LATERAL TRANSPSOAS DISCECTOMY AND FUSION WITH ROBOTIC ASSISTED PEDICLE SCREW STABILIZATION, LUMBAR 4- LUMBAR 5   CATARACT EXTRACTION W/ INTRAOCULAR LENS  IMPLANT, BILATERAL Bilateral 2013   lens implants for cataracts after Lasik   COLONOSCOPY     ENDOVEIN HARVEST OF GREATER SAPHENOUS VEIN Left 06/29/2020    Procedure: HARVEST OF LEFT GREATER SAPHENOUS VEIN;  Surgeon: [name], MD;  Location: [facility];  Service: Vascular;  Laterality: Left;   FEMORAL-POPLITEAL BYPASS GRAFT Left 06/29/2020   Procedure: BYPASS GRAFT LEFT COMMON FEMORAL TO ABOVE KNEE POPLITEAL ARTERY;  Surgeon: [name], MD;  Location: [facility];  Service: Vascular;  Laterality: Left;   KNEE ARTHROSCOPY Right 1980   TONSILLECTOMY     at a very young age, but grew back   TOTAL HIP ARTHROPLASTY Right 05/25/2019   Procedure: RIGHT TOTAL HIP ARTHROPLASTY ANTERIOR APPROACH;  Surgeon: Leandrew Koyanagi, MD;  Location: [facility];  Service: Orthopedics;  Laterality: Right;   Social History   Occupational History   Occupation: retired  Tobacco Use   Smoking status: Every Day    Packs/day: 0.25    Years: 53.00    Pack years: 13.25    Types: Cigarettes   Smokeless tobacco: Never   Tobacco comments:    had first cigarettes age  3, habit started at age 61  Vaping Use   Vaping Use: Never used  Substance and Sexual Activity   Alcohol use: Not Currently    Comment: 05/10/1991   Drug use: Not Currently    Comment: prior usage of "speed"  when drinking   Sexual activity: Yes

## 2021-04-06 ENCOUNTER — Encounter (HOSPITAL_BASED_OUTPATIENT_CLINIC_OR_DEPARTMENT_OTHER)
Admission: RE | Disposition: A | Discharge status: Home / Self Care | Payer: Self-pay | Source: Home / Self Care | Attending: Surgery

## 2021-04-06 ENCOUNTER — Encounter (HOSPITAL_BASED_OUTPATIENT_CLINIC_OR_DEPARTMENT_OTHER): Payer: Self-pay | Admitting: Surgery

## 2021-04-06 ENCOUNTER — Ambulatory Visit (HOSPITAL_BASED_OUTPATIENT_CLINIC_OR_DEPARTMENT_OTHER)
Admission: RE | Admit: 2021-04-06 | Discharge: 2021-04-06 | Disposition: A | Discharge status: Home / Self Care | Payer: Medicare Other | Attending: Surgery | Admitting: Surgery

## 2021-04-06 ENCOUNTER — Ambulatory Visit (HOSPITAL_BASED_OUTPATIENT_CLINIC_OR_DEPARTMENT_OTHER): Payer: Medicare Other | Admitting: Anesthesiology

## 2021-04-06 ENCOUNTER — Other Ambulatory Visit: Payer: Self-pay

## 2021-04-06 DIAGNOSIS — E785 Hyperlipidemia, unspecified: Secondary | ICD-10-CM | POA: Diagnosis not present

## 2021-04-06 DIAGNOSIS — N183 Chronic kidney disease, stage 3 unspecified: Secondary | ICD-10-CM | POA: Diagnosis not present

## 2021-04-06 DIAGNOSIS — N289 Disorder of kidney and ureter, unspecified: Secondary | ICD-10-CM | POA: Diagnosis not present

## 2021-04-06 DIAGNOSIS — F17210 Nicotine dependence, cigarettes, uncomplicated: Secondary | ICD-10-CM | POA: Insufficient documentation

## 2021-04-06 DIAGNOSIS — I739 Peripheral vascular disease, unspecified: Secondary | ICD-10-CM | POA: Insufficient documentation

## 2021-04-06 DIAGNOSIS — K620 Anal polyp: Secondary | ICD-10-CM

## 2021-04-06 DIAGNOSIS — K644 Residual hemorrhoidal skin tags: Secondary | ICD-10-CM | POA: Insufficient documentation

## 2021-04-06 HISTORY — PX: RECTAL EXAM UNDER ANESTHESIA: SHX6399

## 2021-04-06 HISTORY — PX: HEMORRHOID SURGERY: SHX153

## 2021-04-06 SURGERY — HEMORRHOIDECTOMY
Anesthesia: General | Site: Rectum

## 2021-04-06 MED ORDER — EPHEDRINE 5 MG/ML INJ
INTRAVENOUS | Status: AC
  Filled 2021-04-06: qty 5

## 2021-04-06 MED ORDER — PHENYLEPHRINE 40 MCG/ML (10ML) SYRINGE FOR IV PUSH (FOR BLOOD PRESSURE SUPPORT)
PREFILLED_SYRINGE | INTRAVENOUS | Status: AC
  Filled 2021-04-06: qty 10

## 2021-04-06 MED ORDER — LIDOCAINE HCL 1 % IJ SOLN
2.0000 mL | INTRAMUSCULAR | Status: AC | PRN
  Administered 2021-04-04: 2 mL

## 2021-04-06 MED ORDER — HYALURONAN 88 MG/4ML IX SOSY
88.0000 mg | PREFILLED_SYRINGE | INTRA_ARTICULAR | Status: AC | PRN
  Administered 2021-04-04: 88 mg via INTRA_ARTICULAR

## 2021-04-06 MED ORDER — ACETAMINOPHEN 10 MG/ML IV SOLN: 1000.0000 mg | Freq: Once | INTRAVENOUS | Status: DC | PRN

## 2021-04-06 MED ORDER — DIBUCAINE (PERIANAL) 1 % EX OINT
TOPICAL_OINTMENT | CUTANEOUS | Status: DC | PRN
  Administered 2021-04-06: 1 via RECTAL

## 2021-04-06 MED ORDER — DEXAMETHASONE SODIUM PHOSPHATE 10 MG/ML IJ SOLN
INTRAMUSCULAR | Status: AC
  Filled 2021-04-06: qty 1

## 2021-04-06 MED ORDER — ACETAMINOPHEN 500 MG PO TABS
ORAL_TABLET | ORAL | Status: AC
  Filled 2021-04-06: qty 2

## 2021-04-06 MED ORDER — BUPIVACAINE-EPINEPHRINE 0.25% -1:200000 IJ SOLN
INTRAMUSCULAR | Status: DC | PRN
  Administered 2021-04-06: 30 mL

## 2021-04-06 MED ORDER — ONDANSETRON HCL 4 MG/2ML IJ SOLN
INTRAMUSCULAR | Status: AC
  Filled 2021-04-06: qty 2

## 2021-04-06 MED ORDER — BUPIVACAINE LIPOSOME 1.3 % IJ SUSP
INTRAMUSCULAR | Status: DC | PRN
  Administered 2021-04-06: 20 mL

## 2021-04-06 MED ORDER — ONDANSETRON HCL 4 MG/2ML IJ SOLN: 4.0000 mg | Freq: Once | INTRAMUSCULAR | Status: DC | PRN

## 2021-04-06 MED ORDER — OXYCODONE HCL 5 MG/5ML PO SOLN: 5.0000 mg | Freq: Once | ORAL | Status: DC | PRN

## 2021-04-06 MED ORDER — LIDOCAINE HCL (CARDIAC) PF 100 MG/5ML IV SOSY
PREFILLED_SYRINGE | INTRAVENOUS | Status: DC | PRN
Start: 2021-04-06 — End: 2021-04-06
  Administered 2021-04-06: 100 mg via INTRAVENOUS

## 2021-04-06 MED ORDER — FENTANYL CITRATE (PF) 100 MCG/2ML IJ SOLN: 25.0000 ug | INTRAMUSCULAR | Status: DC | PRN

## 2021-04-06 MED ORDER — ROCURONIUM BROMIDE 100 MG/10ML IV SOLN
INTRAVENOUS | Status: DC | PRN
  Administered 2021-04-06: 70 mg via INTRAVENOUS

## 2021-04-06 MED ORDER — BUPIVACAINE LIPOSOME 1.3 % IJ SUSP: 20.0000 mL | Freq: Once | INTRAMUSCULAR | Status: DC

## 2021-04-06 MED ORDER — PROPOFOL 10 MG/ML IV BOLUS
INTRAVENOUS | Status: AC
Start: 2021-04-06 — End: ?
  Filled 2021-04-06: qty 20

## 2021-04-06 MED ORDER — BUPIVACAINE HCL 0.25 % IJ SOLN
2.0000 mL | INTRAMUSCULAR | Status: AC | PRN
  Administered 2021-04-04: 2 mL via INTRA_ARTICULAR

## 2021-04-06 MED ORDER — FENTANYL CITRATE (PF) 100 MCG/2ML IJ SOLN
INTRAMUSCULAR | Status: AC
  Filled 2021-04-06: qty 2

## 2021-04-06 MED ORDER — GLYCOPYRROLATE PF 0.2 MG/ML IJ SOSY
PREFILLED_SYRINGE | INTRAMUSCULAR | Status: AC
  Filled 2021-04-06: qty 1

## 2021-04-06 MED ORDER — 0.9 % SODIUM CHLORIDE (POUR BTL) OPTIME
TOPICAL | Status: DC | PRN
  Administered 2021-04-06: 500 mL

## 2021-04-06 MED ORDER — EPHEDRINE SULFATE (PRESSORS) 50 MG/ML IJ SOLN
INTRAMUSCULAR | Status: DC | PRN
  Administered 2021-04-06: 10 mg via INTRAVENOUS

## 2021-04-06 MED ORDER — DEXAMETHASONE SODIUM PHOSPHATE 4 MG/ML IJ SOLN
INTRAMUSCULAR | Status: DC | PRN
  Administered 2021-04-06: 10 mg via INTRAVENOUS

## 2021-04-06 MED ORDER — TRAMADOL HCL 50 MG PO TABS: 50.0000 mg | ORAL_TABLET | Freq: Four times a day (QID) | ORAL | 0 refills | Status: AC | PRN

## 2021-04-06 MED ORDER — ACETAMINOPHEN 500 MG PO TABS
1000.0000 mg | ORAL_TABLET | ORAL | Status: AC
  Administered 2021-04-06: 1000 mg via ORAL

## 2021-04-06 MED ORDER — ONDANSETRON HCL 4 MG/2ML IJ SOLN
INTRAMUSCULAR | Status: DC | PRN
  Administered 2021-04-06: 4 mg via INTRAVENOUS

## 2021-04-06 MED ORDER — ROCURONIUM BROMIDE 10 MG/ML (PF) SYRINGE
PREFILLED_SYRINGE | INTRAVENOUS | Status: AC
  Filled 2021-04-06: qty 10

## 2021-04-06 MED ORDER — PHENYLEPHRINE HCL (PRESSORS) 10 MG/ML IV SOLN
INTRAVENOUS | Status: DC | PRN
  Administered 2021-04-06 (×2): 80 ug via INTRAVENOUS

## 2021-04-06 MED ORDER — GLYCOPYRROLATE 0.2 MG/ML IJ SOLN
INTRAMUSCULAR | Status: DC | PRN
  Administered 2021-04-06: .2 mg via INTRAVENOUS

## 2021-04-06 MED ORDER — PROPOFOL 10 MG/ML IV BOLUS
INTRAVENOUS | Status: DC | PRN
  Administered 2021-04-06: 150 mg via INTRAVENOUS

## 2021-04-06 MED ORDER — SUGAMMADEX SODIUM 200 MG/2ML IV SOLN
INTRAVENOUS | Status: DC | PRN
  Administered 2021-04-06: 200 mg via INTRAVENOUS

## 2021-04-06 MED ORDER — OXYCODONE HCL 5 MG PO TABS: 5.0000 mg | ORAL_TABLET | Freq: Once | ORAL | Status: DC | PRN

## 2021-04-06 MED ORDER — FENTANYL CITRATE (PF) 100 MCG/2ML IJ SOLN
INTRAMUSCULAR | Status: DC | PRN
Start: 2021-04-06 — End: 2021-04-06
  Administered 2021-04-06 (×2): 50 ug via INTRAVENOUS
  Administered 2021-04-06: 100 ug via INTRAVENOUS

## 2021-04-06 MED ORDER — SODIUM CHLORIDE 0.9 % IV SOLN: INTRAVENOUS | Status: DC

## 2021-04-06 SURGICAL SUPPLY — 75 items
APL SKNCLS STERI-STRIP NONHPOA (GAUZE/BANDAGES/DRESSINGS) ×2
BENZOIN TINCTURE PRP APPL 2/3 (GAUZE/BANDAGES/DRESSINGS) ×4 IMPLANT
BLADE EXTENDED COATED 6.5IN (ELECTRODE) ×2 IMPLANT
BLADE SURG 10 STRL SS (BLADE) IMPLANT
BLADE SURG 15 STRL LF DISP TIS (BLADE) ×1 IMPLANT
BLADE SURG 15 STRL SS (BLADE) ×2
COVER BACK TABLE 60X90IN (DRAPES) ×2 IMPLANT
COVER MAYO STAND STRL (DRAPES) ×2 IMPLANT
DECANTER SPIKE VIAL GLASS SM (MISCELLANEOUS) ×2 IMPLANT
DRAPE HYSTEROSCOPY (MISCELLANEOUS) IMPLANT
DRAPE LAPAROTOMY 100X72 PEDS (DRAPES) ×2 IMPLANT
DRAPE SHEET LG 3/4 BI-LAMINATE (DRAPES) IMPLANT
DRAPE UTILITY XL STRL (DRAPES) ×2 IMPLANT
DRSG PAD ABDOMINAL 8X10 ST (GAUZE/BANDAGES/DRESSINGS) ×2 IMPLANT
ELECT REM PT RETURN 9FT ADLT (ELECTROSURGICAL) ×2
ELECTRODE REM PT RTRN 9FT ADLT (ELECTROSURGICAL) ×1 IMPLANT
GAUZE 4X4 16PLY ~~LOC~~+RFID DBL (SPONGE) ×2 IMPLANT
GAUZE SPONGE 4X4 12PLY STRL (GAUZE/BANDAGES/DRESSINGS) ×2 IMPLANT
GLOVE SRG 8 PF TXTR STRL LF DI (GLOVE) ×1 IMPLANT
GLOVE SURG ENC MOIS LTX SZ7.5 (GLOVE) ×2 IMPLANT
GLOVE SURG UNDER POLY LF SZ6.5 (GLOVE) ×2 IMPLANT
GLOVE SURG UNDER POLY LF SZ7.5 (GLOVE) ×1 IMPLANT
GLOVE SURG UNDER POLY LF SZ8 (GLOVE) ×2
GOWN STRL REUS W/ TWL LRG LVL3 (GOWN DISPOSABLE) IMPLANT
GOWN STRL REUS W/TWL LRG LVL3 (GOWN DISPOSABLE) ×4 IMPLANT
GOWN STRL REUS W/TWL XL LVL3 (GOWN DISPOSABLE) ×2 IMPLANT
HYDROGEN PEROXIDE 16OZ (MISCELLANEOUS) IMPLANT
IV CATH 14GX2 1/4 (CATHETERS) IMPLANT
IV CATH 18G SAFETY (IV SOLUTION) IMPLANT
KIT SIGMOIDOSCOPE (SET/KITS/TRAYS/PACK) IMPLANT
KIT TURNOVER CYSTO (KITS) ×2 IMPLANT
LEGGING LITHOTOMY PAIR STRL (DRAPES) IMPLANT
LIGASURE 7.4 SM JAW OPEN (ELECTROSURGICAL) IMPLANT
LOOP VESSEL MAXI BLUE (MISCELLANEOUS) IMPLANT
NDL HYPO 25X1 1.5 SAFETY (NEEDLE) IMPLANT
NDL SAFETY ECLIPSE 18X1.5 (NEEDLE) IMPLANT
NEEDLE HYPO 18GX1.5 SHARP (NEEDLE)
NEEDLE HYPO 22GX1.5 SAFETY (NEEDLE) ×2 IMPLANT
NEEDLE HYPO 25X1 1.5 SAFETY (NEEDLE) IMPLANT
NS IRRIG 500ML POUR BTL (IV SOLUTION) ×2 IMPLANT
PACK BASIN DAY SURGERY FS (CUSTOM PROCEDURE TRAY) ×2 IMPLANT
PAD ARMBOARD 7.5X6 YLW CONV (MISCELLANEOUS) IMPLANT
PANTS MESH DISP LRG (UNDERPADS AND DIAPERS) ×1 IMPLANT
PANTS MESH DISPOSABLE L (UNDERPADS AND DIAPERS) ×1
PENCIL SMOKE EVACUATOR (MISCELLANEOUS) ×2 IMPLANT
SPONGE HEMORRHOID 8X3CM (HEMOSTASIS) IMPLANT
SPONGE SURGIFOAM ABS GEL 100 (HEMOSTASIS) IMPLANT
SPONGE SURGIFOAM ABS GEL 12-7 (HEMOSTASIS) IMPLANT
SUCTION FRAZIER HANDLE 10FR (MISCELLANEOUS)
SUCTION TUBE FRAZIER 10FR DISP (MISCELLANEOUS) IMPLANT
SUT CHROMIC 2 0 SH (SUTURE) IMPLANT
SUT CHROMIC 3 0 SH 27 (SUTURE) IMPLANT
SUT MNCRL AB 4-0 PS2 18 (SUTURE) IMPLANT
SUT SILK 0 TIES 10X30 (SUTURE) IMPLANT
SUT SILK 2 0 (SUTURE)
SUT SILK 2-0 18XBRD TIE 12 (SUTURE) IMPLANT
SUT VIC AB 2-0 SH 27 (SUTURE)
SUT VIC AB 2-0 SH 27XBRD (SUTURE) IMPLANT
SUT VIC AB 2-0 UR6 27 (SUTURE) ×2 IMPLANT
SUT VIC AB 3-0 SH 18 (SUTURE) IMPLANT
SUT VIC AB 3-0 SH 27 (SUTURE) ×6
SUT VIC AB 3-0 SH 27X BRD (SUTURE) ×1 IMPLANT
SUT VIC AB 3-0 SH 27XBRD (SUTURE) IMPLANT
SUT VIC AB 4-0 P-3 18XBRD (SUTURE) IMPLANT
SUT VIC AB 4-0 P3 18 (SUTURE)
SYR 20ML LL LF (SYRINGE) IMPLANT
SYR BULB EAR ULCER 3OZ GRN STR (SYRINGE) IMPLANT
SYR BULB IRRIG 60ML STRL (SYRINGE) ×2 IMPLANT
SYR CONTROL 10ML LL (SYRINGE) ×2 IMPLANT
SYR TB 1ML LL NO SAFETY (SYRINGE) IMPLANT
TOWEL OR 17X26 10 PK STRL BLUE (TOWEL DISPOSABLE) ×2 IMPLANT
TRAY DSU PREP LF (CUSTOM PROCEDURE TRAY) ×2 IMPLANT
TUBE CONNECTING 12X1/4 (SUCTIONS) ×2 IMPLANT
WATER STERILE IRR 500ML POUR (IV SOLUTION) IMPLANT
YANKAUER SUCT BULB TIP NO VENT (SUCTIONS) ×2 IMPLANT

## 2021-04-06 NOTE — Discharge Instructions (Addendum)
ANORECTAL SURGERY: POST OP INSTRUCTIONS  DIET: Follow a light bland diet the first 24 hours after arrival home, such as soup, liquids, crackers, etc.  Be sure to include lots of fluids daily.  Avoid fast food or heavy meals as your are more likely to get nauseated.  Eat a low fat diet the next few days after surgery.   Some bleeding with bowel movements is expected for the first couple of days but this should stop in between bowel movements  Take your usually prescribed home medications unless otherwise directed.  PAIN CONTROL: It is helpful to take an over-the-counter pain medication regularly for the first few days/weeks.  Choose from the following that works best for you: Ibuprofen (Advil, etc) Three 200mg  tabs every 6 hours as needed. Acetaminophen (Tylenol, etc) 500-650mg  every 6 hours as needed NOTE: You may take both of these medications together - most patients find it most helpful when alternating between the two (i.e. Ibuprofen at 6am, tylenol at 9am, ibuprofen at 12pm ..Marland Kitchen) A  prescription for pain medication may have been prescribed for you at discharge.  Take your pain medication as prescribed.  If you are having problems/concerns with the prescription medicine, please call us for further advice.  Avoid getting constipated.  Between the surgery and the pain medications, it is common to experience some constipation.  Increasing fluid intake (64oz of water per day) and taking a fiber supplement (such as Metamucil, Citrucel, FiberCon) 1-2 times a day regularly will usually help prevent this problem from occurring.  Take Miralax (over the counter) 1-2x/day while taking a narcotic pain medication. If no bowel movement after 48hours, you may additionally take a laxative like a bottle of Milk of Magnesia which can be purchased over the counter. Avoid enemas if possible as these are often painful.   Watch out for diarrhea.  If you have many loose bowel movements, simplify your diet to bland  foods.  Stop any stool softeners and decrease your fiber supplement. If this worsens or does not improve, please call us.  Wash / shower every day.  If you were discharged with a dressing, you may remove this the day after your surgery. You may shower normally, getting soap/water on your wound, particularly after bowel movements.  Soaking in a warm bath filled a couple inches ("Sitz bath") is a great way to clean the area after a bowel movement and many patients find it is a way to soothe the area.  ACTIVITIES as tolerated:   You may resume regular (light) daily activities beginning the next day--such as daily self-care, walking, climbing stairs--gradually increasing activities as tolerated.  If you can walk 30 minutes without difficulty, it is safe to try more intense activity such as jogging, treadmill, bicycling, low-impact aerobics, etc. Refrain from any heavy lifting or straining for the first 2 weeks after your procedure, particularly if your surgery was for hemorrhoids. Avoid activities that make your pain worse You may drive when you are no longer taking prescription pain medication, you can comfortably wear a seatbelt, and you can safely maneuver your car and apply brakes.  FOLLOW UP in our office Please call [facility] at [phone] to set up an appointment to see your surgeon in the office for a follow-up appointment approximately 2 weeks after your surgery. Make sure that you call for this appointment the day you arrive home to insure a convenient appointment time.  9. If you have disability or family leave forms that need to be completed,  you may have them completed by your primary care physician's office; for return to work instructions, please ask our office staff and they will be happy to assist you in obtaining this documentation   When to call us [phone]: Poor pain control Reactions / problems with new medications (rash/itching, etc)  Fever over 101.5 F (38.5 C) Inability  to urinate Nausea/vomiting Worsening swelling or bruising Continued bleeding from incision. Increased pain, redness, or drainage from the incision  The clinic staff is available to answer your questions during regular business hours (8:30am-5pm).  Please dont hesitate to call and ask to speak to one of our nurses for clinical concerns.   A surgeon from [facility] is always on call at the hospitals   If you have a medical emergency, go to the nearest emergency room or call 911.   [facility] [address] [place], [place], [place]  [address] MAIN: [phone] FAX: [phone] [email or web address]     Post Anesthesia Home Care Instructions  Activity: Get plenty of rest for the remainder of the day. A responsible individual must stay with you for 24 hours following the procedure.  For the next 24 hours, DO NOT: -Drive a car -Paediatric nurse -Drink alcoholic beverages -Take any medication unless instructed by your physician -Make any legal decisions or sign important papers.  Meals: Start with liquid foods such as gelatin or soup. Progress to regular foods as tolerated. Avoid greasy, spicy, heavy foods. If nausea and/or vomiting occur, drink only clear liquids until the nausea and/or vomiting subsides. Call your physician if vomiting continues.  Special Instructions/Symptoms: Your throat may feel dry or sore from the anesthesia or the breathing tube placed in your throat during surgery. If this causes discomfort, gargle with warm salt water. The discomfort should disappear within 24 hours.  Do not take any Tylenol until after 7:15 pm today if needed.       Information for Discharge Teaching: EXPAREL (bupivacaine liposome injectable suspension)   Your surgeon or anesthesiologist gave you EXPAREL(bupivacaine) to help control your pain after surgery.  EXPAREL is a local anesthetic that provides pain  relief by numbing the tissue around the surgical site. EXPAREL is designed to release pain medication over time and can control pain for up to 72 hours. Depending on how you respond to EXPAREL, you may require less pain medication during your recovery.  Possible side effects: Temporary loss of sensation or ability to move in the area where bupivacaine was injected. Nausea, vomiting, constipation Rarely, numbness and tingling in your mouth or lips, lightheadedness, or anxiety may occur. Call your doctor right away if you think you may be experiencing any of these sensations, or if you have other questions regarding possible side effects.  Follow all other discharge instructions given to you by your surgeon or nurse. Eat a healthy diet and drink plenty of water or other fluids.  If you return to the hospital for any reason within 96 hours following the administration of EXPAREL, it is important for health care providers to know that you have received this anesthetic. A teal colored band has been placed on your arm with the date, time and amount of EXPAREL you have received in order to alert and inform your health care providers. Please leave this armband in place for the full 96 hours following administration, and then you may remove the band.

## 2021-04-06 NOTE — Anesthesia Procedure Notes (Signed)
Procedure Name: Intubation Date/Time: 04/06/2021 2:33 PM Performed by: Justice Rocher, CRNA Pre-anesthesia Checklist: Patient identified, Emergency Drugs available, Suction available, Patient being monitored and Timeout performed Patient Re-evaluated:Patient Re-evaluated prior to induction Oxygen Delivery Method: Circle system utilized Preoxygenation: Pre-oxygenation with 100% oxygen Induction Type: IV induction Ventilation: Mask ventilation without difficulty Laryngoscope Size: Mac and 3 Grade View: Grade II Tube type: Oral Tube size: 7.0 mm Number of attempts: 1 Airway Equipment and Method: Stylet and Oral airway Placement Confirmation: ETT inserted through vocal cords under direct vision, positive ETCO2, breath sounds checked- equal and bilateral and CO2 detector Secured at: 22 cm Tube secured with: Tape Dental Injury: Teeth and Oropharynx as per pre-operative assessment

## 2021-04-06 NOTE — Transfer of Care (Signed)
Immediate Anesthesia Transfer of Care Note  Patient: Gerald Carpenter  Procedure(s) Performed: Procedure(s) (LRB): HEMORRHOIDECTOMY 2 COLUMN (N/A) ANORECTAL EXAM UNDER ANESTHESIA/ excision of anal polyp (N/A)  Patient Location: PACU  Anesthesia Type: General  Level of Consciousness: awake, sedated, patient cooperative and responds to stimulation  Airway & Oxygen Therapy: Patient Spontanous Breathing and Patient connected to [place] 02 and soft FM  Post-op Assessment: Report given to PACU RN, Post -op Vital signs reviewed and stable and Patient moving all extremities  Post vital signs: Reviewed and stable  Complications: No apparent anesthesia complications

## 2021-04-06 NOTE — Anesthesia Preprocedure Evaluation (Signed)
Anesthesia Evaluation  Patient identified by MRN, date of birth, ID band Patient awake    Reviewed: Allergy & Precautions, NPO status , Patient's Chart, lab work & pertinent test results  History of Anesthesia Complications (+) PONV and history of anesthetic complications  Airway Mallampati: II  TM Distance: >3 FB Neck ROM: Full    Dental no notable dental hx.    Pulmonary Current Smoker and Patient abstained from smoking.,    Pulmonary exam normal breath sounds clear to auscultation       Cardiovascular + Peripheral Vascular Disease  Normal cardiovascular exam Rhythm:Regular Rate:Normal     Neuro/Psych negative neurological ROS  negative psych ROS   GI/Hepatic negative GI ROS, Neg liver ROS,   Endo/Other  negative endocrine ROS  Renal/GU Renal InsufficiencyRenal disease  negative genitourinary   Musculoskeletal negative musculoskeletal ROS (+)   Abdominal   Peds negative pediatric ROS (+)  Hematology negative hematology ROS (+)   Anesthesia Other Findings   Reproductive/Obstetrics negative OB ROS                             Anesthesia Physical Anesthesia Plan  ASA: 3  Anesthesia Plan: General   Post-op Pain Management: Minimal or no pain anticipated   Induction: Intravenous  PONV Risk Score and Plan: 2 and Ondansetron, Dexamethasone and Treatment may vary due to age or medical condition  Airway Management Planned: Oral ETT  Additional Equipment:   Intra-op Plan:   Post-operative Plan: Extubation in OR  Informed Consent: I have reviewed the patients History and Physical, chart, labs and discussed the procedure including the risks, benefits and alternatives for the proposed anesthesia with the patient or authorized representative who has indicated his/her understanding and acceptance.     Dental advisory given  Plan Discussed with: CRNA and Surgeon  Anesthesia Plan  Comments:         Anesthesia Quick Evaluation

## 2021-04-06 NOTE — Op Note (Signed)
04/06/2021  3:21 PM  PATIENT:  Gerald Carpenter  71 y.o. male  Patient Care Team: Haydee Salter, MD as PCP - General (Family Medicine) [name], MD as Consulting Physician (Vascular Surgery) Newt Minion, MD as Consulting Physician (Orthopedic Surgery) Leandrew Koyanagi, MD as Attending Physician (Orthopedic Surgery) Consuella Lose, MD as Consulting Physician (Neurosurgery) Ranell Patrick Clide Deutscher, MD as Consulting Physician (Physical Medicine and Rehabilitation) Ileana Roup, MD as Consulting Physician (General Surgery)  PRE-OPERATIVE DIAGNOSIS: External hemorrhoids  POST-OPERATIVE DIAGNOSIS:  Same  PROCEDURE:   Hemorrhoidectomy right anterior Hemorrhoidectomy posterior midline Excision of anal canal polyp under anoscopic guidance Anorectal exam under anesthesia  SURGEON:  Surgeon(s): Ileana Roup, MD  ASSISTANT: OR staff   ANESTHESIA:   local and general  SPECIMEN:   Right anterior hemorrhoidal tissue Posterior midline hemorrhoidal tissue Incidentally found anal canal polyp  DISPOSITION OF SPECIMEN:  PATHOLOGY  COUNTS:  Sponge, needle, and instrument counts were reported correct x2 at conclusion.  EBL: 5 mL  Drains: None  PLAN OF CARE: Discharge to home after PACU  PATIENT DISPOSITION:  PACU - hemodynamically stable.  OR FINDINGS: Right anterior external hemorrhoidal tissue, left posterior/posterior midline external hemorrhoidal tissue.  This was all removed.  Incidentally during anoscopy, he was found to have an approximately 5 x 5 mm polyp at the level of the dentate line.  Under endoscopic guidance, I was able to excise this for pathologic evaluation.   DESCRIPTION: The patient was identified in the preoperative holding area and taken to the OR. SCDs were applied. He then underwent general endotracheal anesthesia without difficulty. The patient was then rolled onto the OR table in the prone jackknife position. Pressure points were then  evaluated and padded. Benzoin was applied to the buttocks and they were gently taped apart.  He was then prepped and draped in usual sterile fashion.  A surgical timeout was performed indicating the correct patient, procedure, and positioning.  A perianal block was then created using a dilute mixture of 0.25% Marcaine with epinephrine and Exparel.  After ascertaining an appropriate level of anesthesia had been achieved, a well lubricated digital rectal exam was performed. This demonstrated no palpable masses.  A Hill-Ferguson anoscope was into the anal canal and circumferential inspection demonstrated a small anal canal polyp was incidentally found in the right anterior position.  There is no significant internal hemorrhoidal tissue.  This is 5 x 5 mm and soft.  Given this finding, we opted to excise this.  This was elevated with a DeBakey forceps and then excised sharply.  This was passed off as specimen.  Hemostasis was achieved electrocautery and this area was closed with a 3-0 Vicryl stitch.  Attention was then directed at the right anterior external hemorrhoidal tissue.  This was elevated with a DeBakey forcep and the margins of excision were marked.  This was then excised using electrocautery.  This was passed off the specimen.  Hemostasis was achieved electrocautery.  The defect was closed using a running 3-0 Vicryl suture.  Attention was then directed at the left posterior/posterior midline external hemorrhoidal tissue. This was elevated with a DeBakey forcep and the margins of excision were marked.  This was then excised using electrocautery.  This was passed off the specimen.  Hemostasis was achieved electrocautery.  The defect was closed using a running 3-0 Vicryl suture.   The anus is then irrigated and hemostasis verified.  All counts were reported correct.  Additional anesthetic is infiltrated.  Topical  Dibucaine is applied.  A dressing consisting of 4 x 4's, ABD, mesh underwear was placed.  He  was taken out of the prone position, rolled onto a stretcher, awakened from anesthesia, extubated, and transported to PACU in satisfactory condition.  DISPOSITION: PACU in satisfactory condition.

## 2021-04-06 NOTE — H&P (Signed)
CC: here today for surgery  HPI: Gerald Carpenter is an 71 y.o. male with history of HLD, CKD 3, PAD, tobacco use whom was seen in the office today as a referral by Dr. Gena Carpenter for evaluation of hemorrhoids. He has a multi year history of issues with perianal discomfort and prolapse of tags/tissue. This has made hygiene challenging. He has reliably been taking daily metamucil 1-2 tablespoons a day and has achieved soft easy to pass stool. Drinks 64oz water per day. Has been minimizing time on commode to 2-3 minutes. Despite this, the physical tissue at the anal opening has made hygiene difficult. He would like these removed. Currently without any anal pain or bleeding. He has seen GI, Dr. Tarri Carpenter, planning colonoscopy down the road - noted he had one in [place] 3 years ago and had 2 polyps removed. Surveillance was recommended in ~15yrs  PMH: HLD, CKD 3, PAD  PSH: He denies any prior anorectal procedures or surgeries. Vascular surgery 05/2020 with Dr. Carlis Abbott.  FHx: Denies any known family history of colorectal, breast, endometrial or ovarian cancer  Social Hx: Denies use of EtOH/illicit drug. Smoker - ~1PPD. 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 was 7 yrs. ols   Peripheral vascular disease (HCC)    PONV (postoperative nausea and vomiting)    "Only when I have aortagram on 06/23/20".   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    Past Surgical History:  Procedure Laterality Date   ABDOMINAL AORTOGRAM W/LOWER EXTREMITY N/A 06/23/2020   Procedure: ABDOMINAL AORTOGRAM W/LOWER EXTREMITY;  Surgeon: [name], MD;  Location: [facility];  Service: Cardiovascular;  Laterality: N/A;   ANTERIOR LAT LUMBAR FUSION Right 02/05/2019   Procedure: ATTEMPTED ANTERIOR LATERAL INTERBODY FUSION;   Surgeon: Consuella Lose, MD;  Location: [place] [facility];  Service: Neurosurgery;  Laterality: Right;  RIGHT LATERAL TRANSPSOAS DISCECTOMY AND FUSION WITH ROBOTIC ASSISTED PEDICLE SCREW STABILIZATION, LUMBAR 4- LUMBAR 5   CATARACT EXTRACTION W/ INTRAOCULAR LENS  IMPLANT, BILATERAL Bilateral 2013   lens implants for cataracts after Lasik   COLONOSCOPY     ENDOVEIN HARVEST OF GREATER SAPHENOUS VEIN Left 06/29/2020   Procedure: HARVEST OF LEFT GREATER SAPHENOUS VEIN;  Surgeon: [name], MD;  Location: [facility];  Service: Vascular;  Laterality: Left;   FEMORAL-POPLITEAL BYPASS GRAFT Left 06/29/2020   Procedure: BYPASS GRAFT LEFT COMMON FEMORAL TO ABOVE KNEE POPLITEAL ARTERY;  Surgeon: [name], MD;  Location: [place];  Service: Vascular;  Laterality: Left;   KNEE ARTHROSCOPY Right 1980   TONSILLECTOMY     at a very young age, but grew back   TOTAL HIP ARTHROPLASTY Right 05/25/2019   Procedure: RIGHT TOTAL HIP ARTHROPLASTY ANTERIOR APPROACH;  Surgeon: Leandrew Koyanagi, MD;  Location: [facility];  Service: Orthopedics;  Laterality: Right;    Family History  Problem Relation Age of Onset   Pulmonary fibrosis Mother    Breast cancer Mother    Hyperlipidemia Mother    Hypertension Mother    Other Mother        Scarlet Fever   Heart disease Father    Hyperlipidemia Father    Diabetes Brother    Hypertension Brother    Prostate cancer Paternal Grandfather    Breast  cancer Maternal Aunt    Hypothyroidism Daughter     Social:  reports that he has been smoking cigarettes. He has a 13.25 pack-year smoking history. He has never used smokeless tobacco. He reports that he does not currently use alcohol. He reports that he does not currently use drugs.  Allergies:  Allergies  Allergen Reactions   Anesthetics, Amide Nausea And Vomiting    Medications: I have reviewed the patient's current medications.  No results found for this or any previous visit (from the past 48 hour(s)).  No results  found.  ROS - all of the below systems have been reviewed with the patient and positives are indicated with bold text General: chills, fever or night sweats Eyes: blurry vision or double vision ENT: epistaxis or sore throat Allergy/Immunology: itchy/watery eyes or nasal congestion Hematologic/Lymphatic: bleeding problems, blood clots or swollen lymph nodes Endocrine: temperature intolerance or unexpected weight changes Breast: new or changing breast lumps or nipple discharge Resp: cough, shortness of breath, or wheezing CV: chest pain or dyspnea on exertion GI: as per HPI GU: dysuria, trouble voiding, or hematuria MSK: joint pain or joint stiffness Neuro: TIA or stroke symptoms Derm: pruritus and skin lesion changes Psych: anxiety and depression  PE Blood pressure 128/76, pulse 89, temperature (!) 97.5 F (36.4 C), temperature source Oral, resp. rate 15, height 5\' 7"  (1.702 m), weight 70.6 kg, SpO2 97 %. Constitutional: NAD; conversant Eyes: Moist conjunctiva; no lid lag; PERRL Lungs: Normal respiratory effort CV: RRR; no pitting edema MSK: Normal range of motion of extremities Psychiatric: Appropriate affect; alert and oriented x3  No results found for this or any previous visit (from the past 48 hour(s)).  No results found.   A/P: Gerald Carpenter is an 71 y.o. male with hx of PAD, HLD here for surgery for his hemorrhoids  -The anatomy and physiology of the anal canal was discussed with the patient with associated pictures. The pathophysiology of hemorrhoids was discussed at length with associated pictures and illustrations. -We have reviewed options going forward including further observation vs surgery. His issues primarily that of an anatomic component given that he has large decompressed external hemorrhoidal tags that are giving him issues with hygiene. We therefore discussed surgical options including hemorrhoidectomy-2 column, anorectal exam under anesthesia. -The  planned procedure, material risks (including, but not limited to, pain, bleeding, infection, scarring, need for blood transfusion, damage to anal sphincter, incontinence of gas and/or stool, need for additional procedures, recurrence, pneumonia, heart attack, stroke, death) benefits and alternatives to surgery were discussed at length. I noted a good probability that the procedure would help improve their symptoms. The patient's questions were answered to his satisfaction, he voiced understanding and elected to proceed with surgery. Additionally, we discussed typical postoperative expectations and the recovery process.  Nadeen Landau, MD [facility], [facility]

## 2021-04-07 ENCOUNTER — Encounter (HOSPITAL_BASED_OUTPATIENT_CLINIC_OR_DEPARTMENT_OTHER): Payer: Self-pay | Admitting: Surgery

## 2021-04-07 LAB — SURGICAL PATHOLOGY

## 2021-04-07 NOTE — Anesthesia Postprocedure Evaluation (Signed)
Anesthesia Post Note  Patient: Gerald Carpenter  Procedure(s) Performed: HEMORRHOIDECTOMY 2 COLUMN (Rectum) ANORECTAL EXAM UNDER ANESTHESIA/ excision of anal polyp (Rectum)     Patient location during evaluation: PACU Anesthesia Type: General Level of consciousness: awake and alert Pain management: pain level controlled Vital Signs Assessment: post-procedure vital signs reviewed and stable Respiratory status: spontaneous breathing, nonlabored ventilation, respiratory function stable and patient connected to nasal cannula oxygen Cardiovascular status: blood pressure returned to baseline and stable Postop Assessment: no apparent nausea or vomiting Anesthetic complications: no   No notable events documented.  Last Vitals:  Vitals:   04/06/21 1600 04/06/21 1735  BP: (!) 111/58 125/76  Pulse: 83 72  Resp: 17 16  Temp:  36.6 C  SpO2: 97% 94%    Last Pain:  Vitals:   04/06/21 1735  TempSrc: Oral  PainSc: 0-No pain                 Andrus Sharp S

## 2021-04-12 ENCOUNTER — Other Ambulatory Visit: Payer: Self-pay

## 2021-04-13 ENCOUNTER — Encounter: Payer: Self-pay | Admitting: Family Medicine

## 2021-04-13 ENCOUNTER — Ambulatory Visit (INDEPENDENT_AMBULATORY_CARE_PROVIDER_SITE_OTHER): Payer: Medicare Other | Admitting: Family Medicine

## 2021-04-13 VITALS — BP 134/70 | HR 79 | Temp 98.2°F | Ht 67.0 in | Wt 159.2 lb

## 2021-04-13 DIAGNOSIS — R399 Unspecified symptoms and signs involving the genitourinary system: Secondary | ICD-10-CM | POA: Diagnosis not present

## 2021-04-13 DIAGNOSIS — Z8719 Personal history of other diseases of the digestive system: Secondary | ICD-10-CM

## 2021-04-13 DIAGNOSIS — Z9889 Other specified postprocedural states: Secondary | ICD-10-CM

## 2021-04-13 DIAGNOSIS — I739 Peripheral vascular disease, unspecified: Secondary | ICD-10-CM

## 2021-04-13 DIAGNOSIS — F17210 Nicotine dependence, cigarettes, uncomplicated: Secondary | ICD-10-CM | POA: Diagnosis not present

## 2021-04-13 DIAGNOSIS — Z1211 Encounter for screening for malignant neoplasm of colon: Secondary | ICD-10-CM

## 2021-04-13 NOTE — Progress Notes (Signed)
[facility] [address] [place] [address] Dept: [phone] Dept Fax: [phone]  Chronic Care Office Visit  Subjective:    Patient ID: Gerald Carpenter, male    DOB: [birth year], 71 y.o..   MRN: [id number]  Chief Complaint  Patient presents with   Follow-up    3 month f/u meds.  No concerns.       History of Present Illness:  Patient is in today for reassessment of chronic medical issues.  Gerald Carpenter has a history of having a hemorrhoid for many years. He underwent hemorrhoidectomy on 2/16. He is taking a stool softener and has used Miralax since the surgery. He still has some pain in the rectum, but only scant bleeding. He notes that Dr. Dema Carpenter (surgery) has recommended he have a repeat colonoscopy. Gerald Carpenter plans to delay this until after his upcoming trip. He would prefer to wait until May.  Gerald Carpenter has a history of claudication due to peripheral artery disease. He underwent a left femoral to above knee popliteal artery bypass in May 2022. He is being managed medically with aspirin and statins currently. The combination of PAD and lumbar disc disease has left him with some weakness and imbalance. He continues to work with physical medicine and has been going through physical therapy. His walkign has improved and he denies any lower leg claudication. He notes Dr. Ranell Carpenter raised a concern abotu his lipid profile, suggesting he may need his atorvastatin dose lowered, as his HDL is quite low.   Gerald Carpenter continues to smoke 0-5 cigarettes per day. He had been trying to use a nicotine patch, but was finding it interferred with his sleep. If he took the patch off at night, he found his nicotine craving so strong in the morning that he was starting his day with a cigarette. He is now using some nicotine lozenges and/or gum and finds this has helped some.   Gerald Carpenter has a history of LUTS. He is on Flomax, which had helped. He  has been awakening to pee more in the last week since his surgery.   Past Medical History: Patient Active Problem List   Diagnosis Date Noted   Leukocytosis 10/11/2020   PAD (peripheral artery disease) ([place]) 06/29/2020   Prediabetes 04/22/2020   Chronic kidney disease, stage 3b ([place]) 04/15/2020   Tobacco dependence due to cigarettes 04/12/2020   Lower urinary tract symptoms (LUTS) 04/12/2020   History of colon polyps 04/12/2020   Atherosclerosis of native arteries of extremity with intermittent claudication ([place]) 03/01/2020   Pain in both feet 07/31/2019   Primary osteoarthritis of right knee 07/07/2019   Status post total replacement of right hip 05/25/2019   Lumbar radiculopathy 02/05/2019   Past Surgical History:  Procedure Laterality Date   ABDOMINAL AORTOGRAM W/LOWER EXTREMITY N/A 06/23/2020   Procedure: ABDOMINAL AORTOGRAM W/LOWER EXTREMITY;  Surgeon: Gerald Heck, MD;  Location: [facility];  Service: Cardiovascular;  Laterality: N/A;   ANTERIOR LAT LUMBAR FUSION Right 02/05/2019   Procedure: ATTEMPTED ANTERIOR LATERAL INTERBODY FUSION;  Surgeon: Consuella Lose, MD;  Location: [facility];  Service: Neurosurgery;  Laterality: Right;  RIGHT LATERAL TRANSPSOAS DISCECTOMY AND FUSION WITH ROBOTIC ASSISTED PEDICLE SCREW STABILIZATION, LUMBAR 4- LUMBAR 5   CATARACT EXTRACTION W/ INTRAOCULAR LENS  IMPLANT, BILATERAL Bilateral 2013   lens implants for cataracts after Lasik   COLONOSCOPY     ENDOVEIN HARVEST OF GREATER SAPHENOUS VEIN Left 06/29/2020   Procedure: HARVEST OF LEFT  GREATER SAPHENOUS VEIN;  Surgeon: Gerald Heck, MD;  Location: [facility];  Service: Vascular;  Laterality: Left;   FEMORAL-POPLITEAL BYPASS GRAFT Left 06/29/2020   Procedure: BYPASS GRAFT LEFT COMMON FEMORAL TO ABOVE KNEE POPLITEAL ARTERY;  Surgeon: Gerald Heck, MD;  Location: [facility];  Service: Vascular;  Laterality: Left;   HEMORRHOID SURGERY N/A 04/06/2021   Procedure: HEMORRHOIDECTOMY 2  COLUMN;  Surgeon: Ileana Roup, MD;  Location: [facility];  Service: General;  Laterality: N/A;   KNEE ARTHROSCOPY Right 1980   RECTAL EXAM UNDER ANESTHESIA N/A 04/06/2021   Procedure: ANORECTAL EXAM UNDER ANESTHESIA/ excision of anal polyp;  Surgeon: Ileana Roup, MD;  Location: [facility];  Service: General;  Laterality: N/A;   TONSILLECTOMY     at a very young age, but grew back   TOTAL HIP ARTHROPLASTY Right 05/25/2019   Procedure: RIGHT TOTAL HIP ARTHROPLASTY ANTERIOR APPROACH;  Surgeon: Leandrew Koyanagi, MD;  Location: [place] [facility];  Service: Orthopedics;  Laterality: Right;   Family History  Problem Relation Age of Onset   Pulmonary fibrosis Mother    Breast cancer Mother    Hyperlipidemia Mother    Hypertension Mother    Other Mother        Scarlet Fever   Heart disease Father    Hyperlipidemia Father    Diabetes Brother    Hypertension Brother    Prostate cancer Paternal Grandfather    Breast cancer Maternal Aunt    Hypothyroidism Daughter    Outpatient Medications Prior to Visit  Medication Sig Dispense Refill   acetaminophen (TYLENOL) 325 MG tablet Take 650 mg by mouth every 6 (six) hours as needed.     aspirin EC 81 MG tablet Take 1 tablet (81 mg total) by mouth daily. Swallow whole. 150 tablet 2   atorvastatin (LIPITOR) 40 MG tablet Take 1 tablet (40 mg total) by mouth daily. 90 tablet 3   ibuprofen (ADVIL) 200 MG tablet Take 200 mg by mouth every 6 (six) hours as needed.     tamsulosin (FLOMAX) 0.4 MG CAPS capsule Take 1 capsule (0.4 mg total) by mouth in the morning. 90 capsule 3   No facility-administered medications prior to visit.   Allergies  Allergen Reactions   Anesthetics, Amide Nausea And Vomiting     Objective:   Today's Vitals   04/13/21 0922  BP: 134/70  Pulse: 79  Temp: 98.2 F (36.8 C)  TempSrc: Temporal  SpO2: 98%  Weight: 159 lb 3.2 oz (72.2 kg)  Height: 5\' 7"  (1.702 m)   Body mass index is 24.93  kg/m.   General: Well developed, well nourished. No acute distress. Psych: Alert and oriented. Normal mood and affect.  Health Maintenance Due  Topic Date Due   Zoster Vaccines- Shingrix (1 of 2) Never done   COVID-19 Vaccine (3 - Pfizer risk series) 06/02/2019   Pneumonia Vaccine 54+ Years old (2 - PPSV23 if available, else PCV20) 04/12/2021   Lab Results Last lipids Lab Results  Component Value Date   CHOL 87 01/11/2021   HDL 21.80 (L) 01/11/2021   LDLCALC 54 01/11/2021   TRIG 57.0 01/11/2021   CHOLHDL 4 01/11/2021     Assessment & Plan:   1. Status post hemorrhoidectomy Recovering well. He should continue the stool softener. He will follow-up with Dr. Dema Carpenter.  2. PAD (peripheral artery disease) (HCC) Stable. No current claudication. We will continue to focus on aggressive medical management with aspirin and statins,  esp. since he continues to smoke cigarettes.   3. Tobacco dependence due to cigarettes I spent 6 minutes discussing smoking cessation. I recommended he return ot use of the nicotine patch, removing at night to reduce interference with sleep. I recommend he use a nicotine lozenge or gum to manage the early morning craving rather than smoking a cigarette.  4. Lower urinary tract symptoms (LUTS) Generally improved on Flomax.  5. Screening for colon cancer I agree with delaying the colonoscopy until May. He will contact his surgeon about this.  Return in about 6 months (around 10/11/2021) for Reassessment.   Haydee Salter, MD

## 2021-04-14 ENCOUNTER — Encounter: Payer: Medicare Other | Admitting: Physical Therapy

## 2021-04-25 ENCOUNTER — Encounter (HOSPITAL_COMMUNITY): Payer: Medicare Other

## 2021-04-25 ENCOUNTER — Ambulatory Visit: Payer: Medicare Other

## 2021-04-25 ENCOUNTER — Other Ambulatory Visit (HOSPITAL_COMMUNITY): Payer: Medicare Other

## 2021-04-26 NOTE — Progress Notes (Unsigned)
HISTORY AND PHYSICAL     CC:  follow up. Requesting Provider:  Haydee Salter, MD  HPI: This is a 71 y.o. male who is here today for follow up for PAD.  He has hx of left CFA to AK popliteal bypass grafting with ipsilateral nonreversed GSV on 06/29/2020 by Dr. Carlis Abbott for short distance lifestyle limiting claudication.    Pt was last seen 10/19/2020 and at that time, he was doing well without rest pain or wounds.  He did have some claudication sx that were equal bilaterally if walking up hill or long distance.  He was tolerating this.  He has hx of L4-L5 fusion, right hip replacement April 2021.  He was continuing to smoke about 3 cigarettes per day.   At that visit, his LLE bypass was widely patent and ABI on the left WNL.  He was scheduled for 6 month follow up.   The pt returns today for follow up.  ***  The pt is on a statin for cholesterol management.    The pt is on an aspirin.    Other AC:  none The pt is not on medication for hypertension.  The pt does not have diabetes. Tobacco hx:  ***  Pt does *** have family hx of AAA.  Past Medical History:  Diagnosis Date   Alcoholism ([place])    Arthritis    Colon polyps    Complication of anesthesia    HLD (hyperlipidemia)    Nephritis    when pt. was 7 yrs. ols   Peripheral vascular disease (HCC)    PONV (postoperative nausea and vomiting)    "Only when I have aortagram on 06/23/20".   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    Past Surgical History:  Procedure Laterality Date   ABDOMINAL AORTOGRAM W/LOWER EXTREMITY N/A 06/23/2020   Procedure: ABDOMINAL AORTOGRAM W/LOWER EXTREMITY;  Surgeon: [name], MD;  Location: [place] [facility];  Service: Cardiovascular;  Laterality: N/A;   ANTERIOR LAT LUMBAR FUSION Right 02/05/2019   Procedure: ATTEMPTED ANTERIOR LATERAL INTERBODY FUSION;  Surgeon: Consuella Lose, MD;  Location: [place];  Service: Neurosurgery;  Laterality: Right;  RIGHT LATERAL TRANSPSOAS DISCECTOMY  AND FUSION WITH ROBOTIC ASSISTED PEDICLE SCREW STABILIZATION, LUMBAR 4- LUMBAR 5   CATARACT EXTRACTION W/ INTRAOCULAR LENS  IMPLANT, BILATERAL Bilateral 2013   lens implants for cataracts after Lasik   COLONOSCOPY     ENDOVEIN HARVEST OF GREATER SAPHENOUS VEIN Left 06/29/2020   Procedure: HARVEST OF LEFT GREATER SAPHENOUS VEIN;  Surgeon: [name], MD;  Location: [place];  Service: Vascular;  Laterality: Left;   FEMORAL-POPLITEAL BYPASS GRAFT Left 06/29/2020   Procedure: BYPASS GRAFT LEFT COMMON FEMORAL TO ABOVE KNEE POPLITEAL ARTERY;  Surgeon: [name], MD;  Location: [place];  Service: Vascular;  Laterality: Left;   HEMORRHOID SURGERY N/A 04/06/2021   Procedure: HEMORRHOIDECTOMY 2 COLUMN;  Surgeon: Ileana Roup, MD;  Location: [place];  Service: General;  Laterality: N/A;   KNEE ARTHROSCOPY Right [place] N/A 04/06/2021   Procedure: ANORECTAL EXAM UNDER ANESTHESIA/ excision of anal polyp;  Surgeon: Ileana Roup, MD;  Location: [facility];  Service: General;  Laterality: N/A;   TONSILLECTOMY     at a very young age, but grew back   TOTAL HIP ARTHROPLASTY Right 05/25/2019   Procedure: RIGHT TOTAL HIP ARTHROPLASTY ANTERIOR APPROACH;  Surgeon: Leandrew Koyanagi, MD;  Location: [place];  Service:  Orthopedics;  Laterality: Right;    Allergies  Allergen Reactions   Anesthetics, Amide Nausea And Vomiting    Current Outpatient Medications  Medication Sig Dispense Refill   acetaminophen (TYLENOL) 325 MG tablet Take 650 mg by mouth every 6 (six) hours as needed.     aspirin EC 81 MG tablet Take 1 tablet (81 mg total) by mouth daily. Swallow whole. 150 tablet 2   atorvastatin (LIPITOR) 40 MG tablet Take 1 tablet (40 mg total) by mouth daily. 90 tablet 3   ibuprofen (ADVIL) 200 MG tablet Take 200 mg by mouth every 6 (six) hours as needed.     tamsulosin (FLOMAX) 0.4 MG CAPS capsule Take 1 capsule (0.4 mg  total) by mouth in the morning. 90 capsule 3   No current facility-administered medications for this visit.    Family History  Problem Relation Age of Onset   Pulmonary fibrosis Mother    Breast cancer Mother    Hyperlipidemia Mother    Hypertension Mother    Other Mother        Scarlet Fever   Heart disease Father    Hyperlipidemia Father    Diabetes Brother    Hypertension Brother    Prostate cancer Paternal Grandfather    Breast cancer Maternal Aunt    Hypothyroidism Daughter     Social History   Socioeconomic History   Marital status: Married    Spouse name: Not on file   Number of children: 3   Years of education: Not on file   Highest education level: Not on file  Occupational History   Occupation: retired  Tobacco Use   Smoking status: Every Day    Packs/day: 0.25    Years: 53.00    Pack years: 13.25    Types: Cigarettes   Smokeless tobacco: Never   Tobacco comments:    had first cigarettes age  85, habit started at age 58  Vaping Use   Vaping Use: Never used  Substance and Sexual Activity   Alcohol use: Not Currently    Comment: 05/10/1991   Drug use: Not Currently    Comment: prior usage of "speed"  when drinking   Sexual activity: Yes  Other Topics Concern   Not on file  Social History Narrative   Recently moved here from [place] [place], [place]   Social Determinants of Health   Financial Resource Strain: Low Risk    Difficulty of Paying Living Expenses: Not hard at all  Food Insecurity: No Food Insecurity   Worried About Charity fundraiser in the Last Year: Never true   Arboriculturist in the Last Year: Never true  Transportation Needs: No Transportation Needs   Lack of Transportation (Medical): No   Lack of Transportation (Non-Medical): No  Physical Activity: Inactive   Days of Exercise per Week: 0 days   Minutes of Exercise per Session: 0 min  Stress: No Stress Concern Present   Feeling of Stress : Not at all  Social Connections: Moderately  Isolated   Frequency of Communication with Friends and Family: More than three times a week   Frequency of Social Gatherings with Friends and Family: Once a week   Attends Religious Services: Never   Marine scientist or Organizations: No   Attends Music therapist: Never   Marital Status: Married  Human resources officer Violence: Not At Risk   Fear of Current or Ex-Partner: No   Emotionally Abused: No   Physically Abused:  No   Sexually Abused: No     REVIEW OF SYSTEMS:  *** '[X]'$  denotes positive finding, '[ ]'$  denotes negative finding Cardiac  Comments:  Chest pain or chest pressure:    Shortness of breath upon exertion:    Short of breath when lying flat:    Irregular heart rhythm:        Vascular    Pain in calf, thigh, or hip brought on by ambulation:    Pain in feet at night that wakes you up from your sleep:     Blood clot in your veins:    Leg swelling:         Pulmonary    Oxygen at home:    Productive cough:     Wheezing:         Neurologic    Sudden weakness in arms or legs:     Sudden numbness in arms or legs:     Sudden onset of difficulty speaking or slurred speech:    Temporary loss of vision in one eye:     Problems with dizziness:         Gastrointestinal    Blood in stool:     Vomited blood:         Genitourinary    Burning when urinating:     Blood in urine:        Psychiatric    Major depression:         Hematologic    Bleeding problems:    Problems with blood clotting too easily:        Skin    Rashes or ulcers:        Constitutional    Fever or chills:      PHYSICAL EXAMINATION:  ***  General:  WDWN in NAD; vital signs documented above Gait: Not observed HENT: WNL, normocephalic Pulmonary: normal non-labored breathing , without wheezing Cardiac: {Desc; regular/irreg:14544} HR, {With/Without:20273} carotid bruit*** Abdomen: soft, NT, no masses; aortic pulse is *** palpable Skin: {With/Without:20273}  rashes Vascular Exam/Pulses:  Right Left  Radial {Exam; arterial pulse strength 0-4:30167} {Exam; arterial pulse strength 0-4:30167}  Femoral {Exam; arterial pulse strength 0-4:30167} {Exam; arterial pulse strength 0-4:30167}  Popliteal {Exam; arterial pulse strength 0-4:30167} {Exam; arterial pulse strength 0-4:30167}  DP {Exam; arterial pulse strength 0-4:30167} {Exam; arterial pulse strength 0-4:30167}  PT {Exam; arterial pulse strength 0-4:30167} {Exam; arterial pulse strength 0-4:30167}  Peroneal *** ***   Extremities: {With/Without:20273} ischemic changes, {With/Without:20273} Gangrene , {With/Without:20273} cellulitis; {With/Without:20273} open wounds;  Musculoskeletal: no muscle wasting or atrophy  Neurologic: A&O X 3 Psychiatric:  The pt has {Desc; normal/abnormal:11317::"Normal"} affect.   Non-Invasive Vascular Imaging:   ABI's/TBI's on 05/02/2021: Right:  *** - Great toe pressure: *** Left:  *** - Great toe pressure: ***  LLE Arterial duplex on 05/02/2021: ***  Previous ABI's/TBI's on 10/19/2020: Right:  0.73/0.41 - Great toe pressure: 54 Left:  0.91/0.77 - Great toe pressure:  103  Previous arterial duplex on 10/19/2020: Left Graft #1: CFA-AK pop  +--------------------+--------+--------+--------+--------+                        PSV cm/s Stenosis Waveform Comments   +--------------------+--------+--------+--------+--------+   Inflow               152               biphasic            +--------------------+--------+--------+--------+--------+   Proximal Anastomosis 168  biphasic            +--------------------+--------+--------+--------+--------+   Proximal Graft       86                biphasic            +--------------------+--------+--------+--------+--------+   Mid Graft            109               biphasic            +--------------------+--------+--------+--------+--------+   Distal Graft         90                biphasic             +--------------------+--------+--------+--------+--------+   Distal Anastomosis   93                biphasic            +--------------------+--------+--------+--------+--------+   Outflow              64                biphasic            +--------------------+--------+--------+--------+--------+     ASSESSMENT/PLAN:: 71 y.o. male here for follow up for hx of left CFA to AK popliteal bypass grafting with ipsilateral nonreversed GSV on 06/29/2020 by Dr. Carlis Abbott for short distance lifestyle limiting claudication.    -*** -pt will f/u in *** with ***.   Leontine Locket, [facility] [phone]  Clinic MD:   Carlis Abbott

## 2021-05-02 ENCOUNTER — Ambulatory Visit (INDEPENDENT_AMBULATORY_CARE_PROVIDER_SITE_OTHER): Payer: Medicare Other | Admitting: Physician Assistant

## 2021-05-02 ENCOUNTER — Ambulatory Visit (INDEPENDENT_AMBULATORY_CARE_PROVIDER_SITE_OTHER)
Admission: RE | Admit: 2021-05-02 | Discharge: 2021-05-02 | Disposition: A | Discharge status: Home / Self Care | Payer: Medicare Other | Source: Ambulatory Visit | Attending: Physician Assistant | Admitting: Physician Assistant

## 2021-05-02 ENCOUNTER — Other Ambulatory Visit: Payer: Self-pay

## 2021-05-02 ENCOUNTER — Ambulatory Visit (HOSPITAL_COMMUNITY)
Admission: RE | Admit: 2021-05-02 | Discharge: 2021-05-02 | Disposition: A | Discharge status: Home / Self Care | Payer: Medicare Other | Source: Ambulatory Visit | Attending: Physician Assistant | Admitting: Physician Assistant

## 2021-05-02 VITALS — BP 132/70 | HR 80 | Temp 98.2°F | Resp 18 | Ht 67.0 in | Wt 162.7 lb

## 2021-05-02 DIAGNOSIS — I739 Peripheral vascular disease, unspecified: Secondary | ICD-10-CM

## 2021-05-18 ENCOUNTER — Encounter: Payer: Medicare Other | Admitting: Gastroenterology

## 2021-05-18 ENCOUNTER — Encounter: Payer: Medicare Other | Admitting: Physical Medicine and Rehabilitation

## 2021-07-05 ENCOUNTER — Encounter: Payer: Self-pay | Admitting: Family Medicine

## 2021-07-05 ENCOUNTER — Ambulatory Visit (INDEPENDENT_AMBULATORY_CARE_PROVIDER_SITE_OTHER): Payer: Medicare Other | Admitting: Family Medicine

## 2021-07-05 VITALS — BP 124/76 | HR 70 | Temp 98.0°F | Ht 67.0 in | Wt 160.0 lb

## 2021-07-05 DIAGNOSIS — M79672 Pain in left foot: Secondary | ICD-10-CM

## 2021-07-05 DIAGNOSIS — R208 Other disturbances of skin sensation: Secondary | ICD-10-CM

## 2021-07-05 DIAGNOSIS — M79671 Pain in right foot: Secondary | ICD-10-CM

## 2021-07-05 LAB — TSH: TSH: 2.29 u[IU]/mL (ref 0.35–5.50)

## 2021-07-05 LAB — COMPREHENSIVE METABOLIC PANEL
ALT: 23 U/L (ref 0–53)
AST: 15 U/L (ref 0–37)
Albumin: 4 g/dL (ref 3.5–5.2)
Alkaline Phosphatase: 62 U/L (ref 39–117)
BUN: 14 mg/dL (ref 6–23)
CO2: 24 mEq/L (ref 19–32)
Calcium: 9.1 mg/dL (ref 8.4–10.5)
Chloride: 106 mEq/L (ref 96–112)
Creatinine, Ser: 1.23 mg/dL (ref 0.40–1.50)
GFR: 60.33 mL/min (ref >60.00)
Glucose, Bld: 111 mg/dL — ABNORMAL HIGH (ref 70–99)
Potassium: 4.1 mEq/L (ref 3.5–5.1)
Sodium: 138 mEq/L (ref 135–145)
Total Bilirubin: 0.5 mg/dL (ref 0.2–1.2)
Total Protein: 7 g/dL (ref 6.0–8.3)

## 2021-07-05 LAB — CBC
HCT: 47.8 % (ref 39.0–52.0)
Hemoglobin: 15.9 g/dL (ref 13.0–17.0)
MCHC: 33.2 g/dL (ref 30.0–36.0)
MCV: 94.5 fl (ref 78.0–100.0)
Platelets: 173 10*3/uL (ref 150.0–400.0)
RBC: 5.06 Mil/uL (ref 4.22–5.81)
RDW: 14.7 % (ref 11.5–15.5)
WBC: 14.1 10*3/uL — ABNORMAL HIGH (ref 4.0–10.5)

## 2021-07-05 LAB — FOLATE: Folate: 13 ng/mL (ref >5.9)

## 2021-07-05 LAB — VITAMIN B12: Vitamin B-12: 1189 pg/mL — ABNORMAL HIGH (ref 211–911)

## 2021-07-05 NOTE — Progress Notes (Signed)
?[facility] ?[address] ?[place] [address] ?Dept: [phone] ?Dept Fax: [phone] ? ?Office Visit ? ?Subjective:  ? ? Patient ID: Gerald Carpenter, male    DOB: 10-18-1950, 71 y.o..   MRN: [id number] ? ?Chief Complaint  ?Patient presents with  ? Acute Visit  ?  C/o having pain in the bottom of both feet x months.  No OTC meds. Taken.  ? ? ?History of Present Illness: ? ?Patient is in today for ongoing evaluation of bilateral foot pain. Gerald Carpenter notes this has bothered him for about 2 years. He describes it feeling like sharp pain along the ball of the foot. The right foot is worse than the let. The pain increases with ambulation. He does have some burning sensation at times as well. He also feels like plantar flexion of his toes is restricted. He states he has had this evaluated by multiple providers in the past and feels quite frustrated that no therapies have worked to reduce his pain. He recalls being told this was due to a pinched nerve by a "foot specialist" (Podiatry?) and having a steroid injection, which did not help./ He saw Dr. Sharol Given (orthopedics) and was told it related to his Achilles tendon. He was told to do ankle stretches. Gerald Carpenter does have a history of a lumbar radiculopathy, for which he had back surgery. He has a history of peripheral arterial disease and claudication, which were managed with an arterial bypass. He also has had a right hip joint replacement. He does continue to smoke. ? ?Past Medical History: ?Patient Active Problem List  ? Diagnosis Date Noted  ? Leukocytosis 10/11/2020  ? PAD (peripheral artery disease) ([place]) 06/29/2020  ? Prediabetes 04/22/2020  ? Chronic kidney disease, stage 3b ([place]) 04/15/2020  ? Tobacco dependence due to cigarettes 04/12/2020  ? Lower urinary tract symptoms (LUTS) 04/12/2020  ? History of colon polyps 04/12/2020  ? Atherosclerosis of native arteries of extremity with intermittent  claudication ([place]) 03/01/2020  ? Pain in both feet 07/31/2019  ? Primary osteoarthritis of right knee 07/07/2019  ? Status post total replacement of right hip 05/25/2019  ? Lumbar radiculopathy 02/05/2019  ? ?Past Surgical History:  ?Procedure Laterality Date  ? ABDOMINAL AORTOGRAM W/LOWER EXTREMITY N/A 06/23/2020  ? Procedure: ABDOMINAL AORTOGRAM W/LOWER EXTREMITY;  Surgeon: [name], MD;  Location: [place] [facility];  Service: Cardiovascular;  Laterality: N/A;  ? ANTERIOR LAT LUMBAR FUSION Right 02/05/2019  ? Procedure: ATTEMPTED ANTERIOR LATERAL INTERBODY FUSION;  Surgeon: Consuella Lose, MD;  Location: [place];  Service: Neurosurgery;  Laterality: Right;  RIGHT LATERAL TRANSPSOAS DISCECTOMY AND FUSION WITH ROBOTIC ASSISTED PEDICLE SCREW STABILIZATION, LUMBAR 4- LUMBAR 5  ? CATARACT EXTRACTION W/ INTRAOCULAR LENS  IMPLANT, BILATERAL Bilateral 2013  ? lens implants for cataracts after Lasik  ? COLONOSCOPY    ? ENDOVEIN HARVEST OF GREATER SAPHENOUS VEIN Left 06/29/2020  ? Procedure: HARVEST OF LEFT GREATER SAPHENOUS VEIN;  Surgeon: [name], MD;  Location: [place];  Service: Vascular;  Laterality: Left;  ? FEMORAL-POPLITEAL BYPASS GRAFT Left 06/29/2020  ? Procedure: BYPASS GRAFT LEFT COMMON FEMORAL TO ABOVE KNEE POPLITEAL ARTERY;  Surgeon: [name], MD;  Location: [place];  Service: Vascular;  Laterality: Left;  ? HEMORRHOID SURGERY N/A 04/06/2021  ? Procedure: HEMORRHOIDECTOMY 2 COLUMN;  Surgeon: Ileana Roup, MD;  Location: [place];  Service: General;  Laterality: N/A;  ? KNEE ARTHROSCOPY Right 1980  ? RECTAL EXAM  UNDER ANESTHESIA N/A 04/06/2021  ? Procedure: ANORECTAL EXAM UNDER ANESTHESIA/ excision of anal polyp;  Surgeon: Ileana Roup, MD;  Location: [facility];  Service: General;  Laterality: N/A;  ? TONSILLECTOMY    ? at a very young age, but grew back  ? TOTAL HIP ARTHROPLASTY Right 05/25/2019  ? Procedure: RIGHT TOTAL HIP  ARTHROPLASTY ANTERIOR APPROACH;  Surgeon: Leandrew Koyanagi, MD;  Location: [place];  Service: Orthopedics;  Laterality: Right;  ? ?Family History  ?Problem Relation Age of Onset  ? Pulmonary fibrosis Mother   ? Breast cancer Mother   ? Hyperlipidemia Mother   ? Hypertension Mother   ? Other Mother   ?     Scarlet Fever  ? Heart disease Father   ? Hyperlipidemia Father   ? Diabetes Brother   ? Hypertension Brother   ? Prostate cancer Paternal Grandfather   ? Breast cancer Maternal Aunt   ? Hypothyroidism Daughter   ? ?Outpatient Medications Prior to Visit  ?Medication Sig Dispense Refill  ? acetaminophen (TYLENOL) 325 MG tablet Take 650 mg by mouth every 6 (six) hours as needed.    ? aspirin EC 81 MG tablet Take 81 mg by mouth daily. Swallow whole.    ? atorvastatin (LIPITOR) 40 MG tablet Take 1 tablet (40 mg total) by mouth daily. 90 tablet 3  ? ibuprofen (ADVIL) 200 MG tablet Take 200 mg by mouth every 6 (six) hours as needed.    ? tamsulosin (FLOMAX) 0.4 MG CAPS capsule Take 1 capsule (0.4 mg total) by mouth in the morning. 90 capsule 3  ? ?No facility-administered medications prior to visit.  ? ?Allergies  ?Allergen Reactions  ? Anesthetics, Amide Nausea And Vomiting  ?  ?Objective:  ? ?Today's Vitals  ? 07/05/21 0755  ?BP: 124/76  ?Pulse: 70  ?Temp: 98 ?F (36.7 ?C)  ?TempSrc: Temporal  ?SpO2: 97%  ?Weight: 160 lb (72.6 kg)  ?Height: '5\' 7"'$  (1.702 m)  ? ?Body mass index is 25.06 kg/m?.  ? ?General: Well developed, well nourished. No acute distress. ?Feet: Skin intact. Cap refill ~ 2 secs. Pulses 2+. No swelling. Tenderness noted with palpation  ? along the plantar aspect of the right foot near the 2nd and 3rd MTP joints. Decreased  ? sensation to the plantar aspect of the right toes and ball. ?Psych: Alert and oriented. Normal mood and affect. ? ?Health Maintenance Due  ?Topic Date Due  ? Zoster Vaccines- Shingrix (1 of 2) Never done  ? Pneumonia Vaccine 8+ Years old (2 - PPSV23 if available, else PCV20) 04/12/2021  ?    ?Assessment & Plan:  ? ?1. Pain in both feet ?2. Decreased sensation of foot ?Gerald Carpenter has a 2-3 year history of bilateral foot pain, with burning and sharp pain. I reviewed the prior consult note from Dr. Sharol Given (09/01/2019) who did in fact assess this as an issue of Achilles tendon contracture and recommend heel stretches. I was also able to find a prior assessment by Dr. Erlinda Hong (07/31/2019) who gave Gerald Carpenter a cortisone injection for Morton's neuroma. He mentions Gerald Carpenter having seen a podiatrist a few weeks prior and also receiving injections then. More recently, Gerald Carpenter was seen by Leontine Locket (Vascular PA). He was showing no signs of circulatory issues. I would consider Morton's neuroma, metatarsalgia, and peripheral neuropathy as part of the differential. Based on the exam today, I would like to refer him to neurology to assess whether there is evidence  for a peripheral neuropathy. I will check some screening labs as well. ? ?- Comprehensive metabolic panel ?- CBC ?- Vitamin B12 ?- TSH ?- Folate ?- Ambulatory referral to Neurology ? ?Return for After testing/imaging/referral.  ? ?Haydee Salter, MD ?

## 2021-07-06 ENCOUNTER — Encounter: Payer: Self-pay | Admitting: Neurology

## 2021-07-07 ENCOUNTER — Encounter: Payer: Self-pay | Admitting: Family Medicine

## 2021-07-13 ENCOUNTER — Ambulatory Visit (AMBULATORY_SURGERY_CENTER): Payer: Medicare Other | Admitting: Gastroenterology

## 2021-07-13 ENCOUNTER — Encounter: Payer: Self-pay | Admitting: Gastroenterology

## 2021-07-13 VITALS — BP 108/80 | HR 63 | Temp 98.6°F | Resp 16 | Ht 65.0 in | Wt 158.0 lb

## 2021-07-13 DIAGNOSIS — Z86010 Personal history of colonic polyps: Secondary | ICD-10-CM | POA: Diagnosis not present

## 2021-07-13 DIAGNOSIS — D125 Benign neoplasm of sigmoid colon: Secondary | ICD-10-CM | POA: Diagnosis not present

## 2021-07-13 DIAGNOSIS — D124 Benign neoplasm of descending colon: Secondary | ICD-10-CM

## 2021-07-13 DIAGNOSIS — D120 Benign neoplasm of cecum: Secondary | ICD-10-CM | POA: Diagnosis not present

## 2021-07-13 DIAGNOSIS — K644 Residual hemorrhoidal skin tags: Secondary | ICD-10-CM

## 2021-07-13 MED ORDER — SODIUM CHLORIDE 0.9 % IV SOLN: 500.0000 mL | Freq: Once | INTRAVENOUS | Status: DC

## 2021-07-13 NOTE — Progress Notes (Signed)
Referring Provider: Haydee Salter, MD Primary Care Physician:  Haydee Salter, MD  Indication for Procedure:  Colon cancer Surveillance   IMPRESSION:  Need for colon cancer surveillance Appropriate candidate for monitored anesthesia care  PLAN: Colonoscopy in the [place] today   HPI: Gerald Carpenter is a 71 y.o. male presents for surveillance colonoscopy.  Prior endoscopic history: He had a colonoscopy in [place] 3 years ago. He had 2 polyps removed at that time. Surveillance recommended in 5 years. He had no issues with the colon prep, procedure, anesthesia, or recovery.    There is no known family history of colon cancer or polyps. No family history of stomach cancer or other GI malignancy. No family history of inflammatory bowel disease or celiac.    Past Medical History:  Diagnosis Date   Alcoholism ([place])    Arthritis    Cataract    Colon polyps    Complication of anesthesia    HLD (hyperlipidemia)    Nephritis    when pt. was 7 yrs. ols   Peripheral vascular disease (HCC)    PONV (postoperative nausea and vomiting)    "Only when I have aortagram on 06/23/20".   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   Substance abuse [facility])     Past Surgical History:  Procedure Laterality Date   ABDOMINAL AORTOGRAM W/LOWER EXTREMITY N/A 06/23/2020   Procedure: ABDOMINAL AORTOGRAM W/LOWER EXTREMITY;  Surgeon: [name], MD;  Location: [place] [facility];  Service: Cardiovascular;  Laterality: N/A;   ANTERIOR LAT LUMBAR FUSION Right 02/05/2019   Procedure: ATTEMPTED ANTERIOR LATERAL INTERBODY FUSION;  Surgeon: Consuella Lose, MD;  Location: [place];  Service: Neurosurgery;  Laterality: Right;  RIGHT LATERAL TRANSPSOAS DISCECTOMY AND FUSION WITH ROBOTIC ASSISTED PEDICLE SCREW STABILIZATION, LUMBAR 4- LUMBAR 5   CATARACT EXTRACTION W/ INTRAOCULAR LENS  IMPLANT, BILATERAL Bilateral 2013   lens implants for cataracts after Lasik   COLONOSCOPY     ENDOVEIN HARVEST  OF GREATER SAPHENOUS VEIN Left 06/29/2020   Procedure: HARVEST OF LEFT GREATER SAPHENOUS VEIN;  Surgeon: [name], MD;  Location: [place];  Service: Vascular;  Laterality: Left;   FEMORAL-POPLITEAL BYPASS GRAFT Left 06/29/2020   Procedure: BYPASS GRAFT LEFT COMMON FEMORAL TO ABOVE KNEE POPLITEAL ARTERY;  Surgeon: [name], MD;  Location: [place];  Service: Vascular;  Laterality: Left;   HEMORRHOID SURGERY N/A 04/06/2021   Procedure: HEMORRHOIDECTOMY 2 COLUMN;  Surgeon: Ileana Roup, MD;  Location: [place];  Service: General;  Laterality: N/A;   KNEE ARTHROSCOPY Right [place] N/A 04/06/2021   Procedure: ANORECTAL EXAM UNDER ANESTHESIA/ excision of anal polyp;  Surgeon: Ileana Roup, MD;  Location: [facility];  Service: General;  Laterality: N/A;   TONSILLECTOMY     at a very young age, but grew back   TOTAL HIP ARTHROPLASTY Right 05/25/2019   Procedure: RIGHT TOTAL HIP ARTHROPLASTY ANTERIOR APPROACH;  Surgeon: Leandrew Koyanagi, MD;  Location: [place];  Service: Orthopedics;  Laterality: Right;    Current Outpatient Medications  Medication Sig Dispense Refill   aspirin EC 81 MG tablet Take 81 mg by mouth daily. Swallow whole.     atorvastatin (LIPITOR) 40 MG tablet Take 1 tablet (40 mg total) by mouth daily. 90 tablet 3   tamsulosin (FLOMAX) 0.4 MG CAPS capsule Take 1 capsule (0.4 mg total) by mouth in the morning. 90 capsule 3   acetaminophen (TYLENOL) 325  MG tablet Take 650 mg by mouth every 6 (six) hours as needed.     ibuprofen (ADVIL) 200 MG tablet Take 200 mg by mouth every 6 (six) hours as needed.     Current Facility-Administered Medications  Medication Dose Route Frequency Provider Last Rate Last Admin   0.9 %  sodium chloride infusion  500 mL Intravenous Once [place], MD        Allergies as of 07/13/2021 - Review Complete 07/13/2021  Allergen Reaction Noted   Anesthetics, amide  Nausea And Vomiting 07/11/2020    Family History  Problem Relation Age of Onset   Pulmonary fibrosis Mother    Breast cancer Mother    Hyperlipidemia Mother    Hypertension Mother    Other Mother        Scarlet Fever   Heart disease Father    Hyperlipidemia Father    Diabetes Brother    Hypertension Brother    Breast cancer Maternal Aunt    Prostate cancer Paternal Grandfather    Hypothyroidism Daughter    Colon cancer Neg Hx    Esophageal cancer Neg Hx    Ulcerative colitis Neg Hx    Uterine cancer Neg Hx      Physical Exam: General:   Alert,  well-nourished, pleasant and cooperative in NAD Head:  Normocephalic and atraumatic. Eyes:  Sclera clear, no icterus.   Conjunctiva pink. Mouth:  No deformity or lesions.   Neck:  Supple; no masses or thyromegaly. Lungs:  Clear throughout to auscultation.   No wheezes. Heart:  Regular rate and rhythm; no murmurs. Abdomen:  Soft, non-tender, nondistended, normal bowel sounds, no rebound or guarding.  Msk:  Symmetrical. No boney deformities LAD: No inguinal or umbilical LAD Extremities:  No clubbing or edema. Neurologic:  Alert and  oriented x4;  grossly nonfocal Skin:  No obvious rash or bruise. Psych:  Alert and cooperative. Normal mood and affect.     Studies/Results: No results found.    Jillayne Witte L. Tarri Glenn, MD, MPH 07/13/2021, 8:06 AM

## 2021-07-13 NOTE — Progress Notes (Signed)
Report to PACU, RN, vss, BBS= Clear.  

## 2021-07-13 NOTE — Progress Notes (Signed)
Called to room to assist during endoscopic procedure.  Patient ID and intended procedure confirmed with present staff. Received instructions for my participation in the procedure from the performing physician.  

## 2021-07-13 NOTE — Op Note (Signed)
[facility] Patient Name: Gerald Carpenter Procedure Date: 07/13/2021 7:09 AM MRN: [id number] Endoscopist: Thornton Park MD, MD Age: 71 Referring MD:  Date of Birth: 1950/10/28 Gender: Male Account #: [id number] Procedure:                Colonoscopy Indications:              Surveillance: Personal history of adenomatous                            polyps on last colonoscopy > 5 years ago                           He had a colonoscopy in [place] 3 years ago. He                            had 2 polyps removed at that time. Surveillance                            recommended in 5 years. Medicines:                Monitored Anesthesia Care Procedure:                After obtaining informed consent, the colonoscope                            was passed under direct vision. Throughout the                            procedure, the patient's blood pressure, pulse, and                            oxygen saturations were monitored continuously. The                            CF [id number] #[id number] was introduced through the anus                            and advanced to the the cecum, identified by                            appendiceal orifice and ileocecal valve. The                            colonoscopy was performed without difficulty. The                            patient tolerated the procedure well. The quality                            of the bowel preparation was good. The ileocecal                            valve, appendiceal orifice, and rectum were  photographed. Scope In: 8:12:00 AM Scope Out: 8:34:03 AM Scope Withdrawal Time: 0 hours 18 minutes 2 seconds  Total Procedure Duration: 0 hours 22 minutes 3 seconds  Findings:                 The perianal and digital rectal examinations were                            normal.                           Non-bleeding internal hemorrhoids were found.                           Multiple small and  large-mouthed diverticula were                            found in the sigmoid colon and descending colon.                           A 2 mm polyp was found in the sigmoid colon. The                            polyp was sessile. The polyp was removed with a                            cold snare. Resection and retrieval were complete.                            Estimated blood loss was minimal.                           A 10 mm polyp was found in the proximal descending                            colon. The polyp was sessile. The polyp was removed                            with a cold snare. Resection and retrieval were                            complete. Estimated blood loss was minimal.                           A 2 mm polyp was found in the cecum. The polyp was                            sessile. The polyp was removed with a cold snare.                            Resection and retrieval were complete. Estimated                            blood loss was minimal.  The exam was otherwise without abnormality on                            direct and retroflexion views. Complications:            No immediate complications. Estimated Blood Loss:     Estimated blood loss was minimal. Impression:               - Non-bleeding internal hemorrhoids.                           - Diverticulosis in the sigmoid colon and in the                            descending colon.                           - One 2 mm polyp in the sigmoid colon, removed with                            a cold snare. Resected and retrieved.                           - One 10 mm polyp in the proximal descending colon,                            removed with a cold snare. Resected and retrieved.                           - One 2 mm polyp in the cecum, removed with a cold                            snare. Resected and retrieved.                           - The examination was otherwise normal on direct                             and retroflexion views. Recommendation:           - Patient has a contact number available for                            emergencies. The signs and symptoms of potential                            delayed complications were discussed with the                            patient. Return to normal activities tomorrow.                            Written discharge instructions were provided to the  patient.                           - High fiber diet.                           - Continue present medications.                           - Await pathology results.                           - Repeat colonoscopy date to be determined after                            pending pathology results are reviewed for                            surveillance.                           - Emerging evidence supports eating a diet of                            fruits, vegetables, grains, calcium, and yogurt                            while reducing red meat and alcohol may reduce the                            risk of colon cancer.                           - Thank you for allowing me to be involved in your                            colon cancer prevention. Thornton Park MD, MD 07/13/2021 8:42:27 AM This report has been signed electronically.

## 2021-07-13 NOTE — Patient Instructions (Signed)
Handouts on polyps, hemorrhoids, and diverticulosis given to you today.   Follow a high fiber diet. Drink at least 64 ounces of water daily.  YOU HAD AN ENDOSCOPIC PROCEDURE TODAY AT [facility]:   Refer to the procedure report that was given to you for any specific questions about what was found during the examination.  If the procedure report does not answer your questions, please call your gastroenterologist to clarify.  If you requested that your care partner not be given the details of your procedure findings, then the procedure report has been included in a sealed envelope for you to review at your convenience later.  YOU SHOULD EXPECT: Some feelings of bloating in the abdomen. Passage of more gas than usual.  Walking can help get rid of the air that was put into your GI tract during the procedure and reduce the bloating. If you had a lower endoscopy (such as a colonoscopy or flexible sigmoidoscopy) you may notice spotting of blood in your stool or on the toilet paper. If you underwent a bowel prep for your procedure, you may not have a normal bowel movement for a few days.  Please Note:  You might notice some irritation and congestion in your nose or some drainage.  This is from the oxygen used during your procedure.  There is no need for concern and it should clear up in a day or so.  SYMPTOMS TO REPORT IMMEDIATELY:  Following lower endoscopy (colonoscopy or flexible sigmoidoscopy):  Excessive amounts of blood in the stool  Significant tenderness or worsening of abdominal pains  Swelling of the abdomen that is new, acute  Fever of 100F or higher  For urgent or emergent issues, a gastroenterologist can be reached at any hour by calling [phone]. Do not use MyChart messaging for urgent concerns.    DIET:  We do recommend a small meal at first, but then you may proceed to your regular diet.  Drink plenty of fluids but you should avoid alcoholic beverages for 24  hours.  ACTIVITY:  You should plan to take it easy for the rest of today and you should NOT DRIVE or use heavy machinery until tomorrow (because of the sedation medicines used during the test).    FOLLOW UP: Our staff will call the number listed on your records 48-72 hours following your procedure to check on you and address any questions or concerns that you may have regarding the information given to you following your procedure. If we do not reach you, we will leave a message.  We will attempt to reach you two times.  During this call, we will ask if you have developed any symptoms of COVID 19. If you develop any symptoms (ie: fever, flu-like symptoms, shortness of breath, cough etc.) before then, please call [phone].  If you test positive for Covid 19 in the 2 weeks post procedure, please call and report this information to [place].    If any biopsies were taken you will be contacted by phone or by letter within the next 1-3 weeks.  Please call us at [phone] if you have not heard about the biopsies in 3 weeks.    SIGNATURES/CONFIDENTIALITY: You and/or your care partner have signed paperwork which will be entered into your electronic medical record.  These signatures attest to the fact that that the information above on your After Visit Summary has been reviewed and is understood.  Full responsibility of the confidentiality of this discharge information  lies with you and/or your care-partner.  

## 2021-07-14 ENCOUNTER — Telehealth: Payer: Self-pay

## 2021-07-14 NOTE — Telephone Encounter (Signed)
  Follow up Call-     07/13/2021    7:11 AM  Call back number  Post procedure Call Back phone  # [phone]  Permission to leave phone message Yes     Patient questions:  Do you have a fever, pain , or abdominal swelling? No. Pain Score  0 *  Have you tolerated food without any problems? Yes.    Have you been able to return to your normal activities? Yes.    Do you have any questions about your discharge instructions: Diet   No. Medications  No. Follow up visit  No.  Do you have questions or concerns about your Care? No.  Actions: * If pain score is 4 or above: No action needed, pain <4.

## 2021-07-14 NOTE — Telephone Encounter (Signed)
Attempted to reach pt. With follow-up call following endoscopic procedure 07/13/2021.  LM On pt. Voice mail.  Will try to reach pt. Again later today.

## 2021-07-18 ENCOUNTER — Encounter: Payer: Self-pay | Admitting: Family Medicine

## 2021-07-25 ENCOUNTER — Encounter: Payer: Self-pay | Admitting: Orthopaedic Surgery

## 2021-07-27 ENCOUNTER — Encounter: Payer: Self-pay | Admitting: Gastroenterology

## 2021-08-01 ENCOUNTER — Ambulatory Visit (INDEPENDENT_AMBULATORY_CARE_PROVIDER_SITE_OTHER): Payer: Medicare Other | Admitting: Physician Assistant

## 2021-08-01 ENCOUNTER — Ambulatory Visit (INDEPENDENT_AMBULATORY_CARE_PROVIDER_SITE_OTHER): Payer: Medicare Other

## 2021-08-01 DIAGNOSIS — M1711 Unilateral primary osteoarthritis, right knee: Secondary | ICD-10-CM | POA: Diagnosis not present

## 2021-08-01 NOTE — Progress Notes (Signed)
Office Visit Note   Patient: Gerald Carpenter           Date of Birth: 1950-03-01           MRN: [id number] Visit Date: 08/01/2021              Requested by: Haydee Salter, MD [place],  [place] [id number] PCP: Haydee Salter, MD   Assessment & Plan: Visit Diagnoses:  1. Unilateral primary osteoarthritis, right knee     Plan: Impression is right knee end-stage degenerative joint disease.  Today, we had a long discussion on various treatment options to include repeat cortisone/viscosupplementation injection versus total knee arthroplasty.  At this point, the injections have failed to provide long-term relief.  He is interested in proceeding with total knee arthroplasty.  He is not a diabetic.  His BMI is acceptable and he takes a baby aspirin daily.  He does tell me that he has seen vascular surgery in the past for a popliteal artery bypass by Dr. Carlis Abbott.  At this point, we will go ahead and get medical clearance.  I do not think we need cardiac clearance.  Risk, benefits and possible occasions reviewed.  Rehab and recovery time discussed.  All questions were answered.  Jackelyn Poling will be in touch to schedule the surgery.  Follow-Up Instructions: Return for post-op.   Orders:  Orders Placed This Encounter  Procedures   XR KNEE 3 VIEW RIGHT   No orders of the defined types were placed in this encounter.     Procedures: No procedures performed   Clinical Data: No additional findings.   Subjective: Chief Complaint  Patient presents with   Right Knee - Pain    HPI patient is a pleasant 71 year old gentleman who comes in today with chronic right knee pain.  History of osteoarthritis to the right knee.  He has been seen by [place] in the past where he has undergone cortisone injection without relief.  He most recently underwent viscosupplementation injection about 4 months ago which only provided a few days relief.  He continues to have pain primarily to the medial  knee which is worse with any activity.  This is starting to affect his ADLs as well.  He is not taking anything for the pain.  At this point, he is ready to proceed with total knee arthroplasty.  Review of Systems as detailed in HPI.  All others reviewed and are negative.   Objective: Vital Signs: There were no vitals taken for this visit.  Physical Exam well-developed well-nourished gentleman in no acute distress.  Alert and oriented x3.  Ortho Exam right knee exam shows no effusion.  Range of motion 0 to 120 degrees.  Marked tenderness medial joint line.  Moderate patellofemoral crepitus.  Ligaments are stable.  He is neurovascular intact distally.  Specialty Comments:  No specialty comments available.  Imaging: XR KNEE 3 VIEW RIGHT  Result Date: 08/01/2021 X-rays demonstrate bone-on-bone medial and patellofemoral compartments    PMFS History: Patient Active Problem List   Diagnosis Date Noted   Leukocytosis 10/11/2020   PAD (peripheral artery disease) ([facility]) 06/29/2020   Prediabetes 04/22/2020   Chronic kidney disease, stage 3b ([facility]) 04/15/2020   Tobacco dependence due to cigarettes 04/12/2020   Lower urinary tract symptoms (LUTS) 04/12/2020   History of colon polyps 04/12/2020   Atherosclerosis of native arteries of extremity with intermittent claudication ([facility]) 03/01/2020   Pain in both feet 07/31/2019   Primary osteoarthritis of  right knee 07/07/2019   Status post total replacement of right hip 05/25/2019   Lumbar radiculopathy 02/05/2019   Past Medical History:  Diagnosis Date   Alcoholism ([facility])    Arthritis    Cataract    Colon polyps    Complication of anesthesia    HLD (hyperlipidemia)    Nephritis    when pt. was 7 yrs. ols   Peripheral vascular disease ([facility])    PONV (postoperative nausea and vomiting)    "Only when I have aortagram on 06/23/20".   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   Substance abuse ([facility])     Family History  Problem Relation  Age of Onset   Pulmonary fibrosis Mother    Breast cancer Mother    Hyperlipidemia Mother    Hypertension Mother    Other Mother        Scarlet Fever   Heart disease Father    Hyperlipidemia Father    Diabetes Brother    Hypertension Brother    Breast cancer Maternal Aunt    Prostate cancer Paternal Grandfather    Hypothyroidism Daughter    Colon cancer Neg Hx    Esophageal cancer Neg Hx    Ulcerative colitis Neg Hx    Uterine cancer Neg Hx     Past Surgical History:  Procedure Laterality Date   ABDOMINAL AORTOGRAM W/LOWER EXTREMITY N/A 06/23/2020   Procedure: ABDOMINAL AORTOGRAM W/LOWER EXTREMITY;  Surgeon: [name], MD;  Location: [facility];  Service: Cardiovascular;  Laterality: N/A;   ANTERIOR LAT LUMBAR FUSION Right 02/05/2019   Procedure: ATTEMPTED ANTERIOR LATERAL INTERBODY FUSION;  Surgeon: Consuella Lose, MD;  Location: [facility];  Service: Neurosurgery;  Laterality: Right;  RIGHT LATERAL TRANSPSOAS DISCECTOMY AND FUSION WITH ROBOTIC ASSISTED PEDICLE SCREW STABILIZATION, LUMBAR 4- LUMBAR 5   CATARACT EXTRACTION W/ INTRAOCULAR LENS  IMPLANT, BILATERAL Bilateral 2013   lens implants for cataracts after Lasik   COLONOSCOPY     ENDOVEIN HARVEST OF GREATER SAPHENOUS VEIN Left 06/29/2020   Procedure: HARVEST OF LEFT GREATER SAPHENOUS VEIN;  Surgeon: [name], MD;  Location: [facility];  Service: Vascular;  Laterality: Left;   FEMORAL-POPLITEAL BYPASS GRAFT Left 06/29/2020   Procedure: BYPASS GRAFT LEFT COMMON FEMORAL TO ABOVE KNEE POPLITEAL ARTERY;  Surgeon: [name], MD;  Location: [facility];  Service: Vascular;  Laterality: Left;   HEMORRHOID SURGERY N/A 04/06/2021   Procedure: HEMORRHOIDECTOMY 2 COLUMN;  Surgeon: Ileana Roup, MD;  Location: [facility];  Service: General;  Laterality: N/A;   KNEE ARTHROSCOPY Right [place] N/A 04/06/2021   Procedure: ANORECTAL EXAM UNDER ANESTHESIA/ excision  of anal polyp;  Surgeon: Ileana Roup, MD;  Location[facility];  Service: General;  Laterality: N/A;   TONSILLECTOMY     at a very young age, but grew back   TOTAL HIP ARTHROPLASTY Right 05/25/2019   Procedure: RIGHT TOTAL HIP ARTHROPLASTY ANTERIOR APPROACH;  Surgeon: Leandrew Koyanagi, MD;  Location: [facility];  Service: Orthopedics;  Laterality: Right;   Social History   Occupational History   Occupation: retired  Tobacco Use   Smoking status: Every Day    Packs/day: 0.25    Years: 53.00    Total pack years: 13.25    Types: Cigarettes   Smokeless tobacco: Never   Tobacco comments:    had first cigarettes age  89, habit started at age 30  [place]  Use: Never used  Substance and Sexual Activity   Alcohol use: Not Currently    Comment: 05/10/1991   Drug use: Not Currently    Comment: prior usage of "speed"  when drinking   Sexual activity: Yes

## 2021-08-09 ENCOUNTER — Telehealth: Payer: Self-pay | Admitting: Family Medicine

## 2021-08-09 ENCOUNTER — Other Ambulatory Visit: Payer: Self-pay | Admitting: Family Medicine

## 2021-08-09 DIAGNOSIS — R399 Unspecified symptoms and signs involving the genitourinary system: Secondary | ICD-10-CM

## 2021-08-09 NOTE — Telephone Encounter (Signed)
Left message for patient to call back and schedule Medicare Annual Wellness Visit (AWV.   Please offer to do virtually or by telephone.  Left office number and my jabber #[phone].  Last AWV:08/09/2020  Please schedule at anytime with Nurse Health Advisor.   

## 2021-08-16 ENCOUNTER — Encounter: Payer: Self-pay | Admitting: Family Medicine

## 2021-08-16 ENCOUNTER — Telehealth: Payer: Self-pay

## 2021-08-16 ENCOUNTER — Ambulatory Visit (INDEPENDENT_AMBULATORY_CARE_PROVIDER_SITE_OTHER): Payer: Medicare Other | Admitting: Family Medicine

## 2021-08-16 VITALS — BP 130/74 | HR 89 | Temp 98.2°F | Ht 65.0 in | Wt 160.6 lb

## 2021-08-16 DIAGNOSIS — Z01818 Encounter for other preprocedural examination: Secondary | ICD-10-CM | POA: Diagnosis not present

## 2021-08-16 DIAGNOSIS — I739 Peripheral vascular disease, unspecified: Secondary | ICD-10-CM | POA: Diagnosis not present

## 2021-08-16 DIAGNOSIS — M1711 Unilateral primary osteoarthritis, right knee: Secondary | ICD-10-CM | POA: Diagnosis not present

## 2021-08-16 DIAGNOSIS — F17201 Nicotine dependence, unspecified, in remission: Secondary | ICD-10-CM

## 2021-08-16 NOTE — Telephone Encounter (Signed)
Called patient and scheduled him for an appointment today at 10:40 am Dm/cma

## 2021-08-16 NOTE — Progress Notes (Signed)
[facility] [address] [place] [address] Dept: [phone] Dept Fax: [phone]  Office Visit  Subjective:    Patient ID: Gerald Carpenter, male    DOB: [birth year], 71 y.o..   MRN: [id number]  Chief Complaint  Patient presents with   Pre-op Exam    Pre-Op for Rt knee surgery.      History of Present Illness:  Patient is in today for preoperative clearance. He had a significant worsening of pain and popping in his right knee. Dr. Erlinda Hong plans a total right knee joint replacement on July 17th. He indicates he plans to perform this under spinal anesthesia. Gerald Carpenter is anxious to get the surgery done, as his knee is significantly impairing his ability to get out and do things he enjoys.  Gerald Carpenter has a long-standing history of smoking cigarettes. He started at age 39 years old when his grandfather allowed him to roll his own cigarettes and smoke them. He had some gap in smoking in childhood, but started smoking daily at about age 80. He had been smoking as much as 2 ppd 4 years ago. In light of significant peripheral arterial disease, he has worked at reducing smoking. He states he has only had 1 cigarette int he past month.  Gerald Carpenter has a history of PAD. He had a left common femoral to popliteal artery bypass in May 2022. His aortogram at that time showed 30-40% stenosis of the common femoral artery. He has stenosis of the proximal posterior tibial artery with diffuse disease more distally. His SFA was patent with good run-off in the above and below knee popliteal artery. Gerald Carpenter is managed on atorvastatin 40 mg daily. He has no history of chest pain, dyspnea, or significant leg swelling.  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 04/12/2020   Lower urinary tract symptoms (LUTS) 04/12/2020   History of colon polyps 04/12/2020   Atherosclerosis of native arteries of extremity with intermittent claudication ([place]) 03/01/2020   Pain in both feet 07/31/2019   Primary osteoarthritis of right knee 07/07/2019   Status post total replacement of right hip 05/25/2019   Lumbar radiculopathy 02/05/2019   Past Surgical History:  Procedure Laterality Date   ABDOMINAL AORTOGRAM W/LOWER EXTREMITY N/A 06/23/2020   Procedure: ABDOMINAL AORTOGRAM W/LOWER EXTREMITY;  Surgeon: [name], MD;  Location: [facility];  Service: Cardiovascular;  Laterality: N/A;   ANTERIOR LAT LUMBAR FUSION Right 02/05/2019   Procedure: ATTEMPTED ANTERIOR LATERAL INTERBODY FUSION;  Surgeon: Consuella Lose, MD;  Location: [facility];  Service: Neurosurgery;  Laterality: Right;  RIGHT LATERAL TRANSPSOAS DISCECTOMY AND FUSION WITH ROBOTIC ASSISTED PEDICLE SCREW STABILIZATION, LUMBAR 4- LUMBAR 5   CATARACT EXTRACTION W/ INTRAOCULAR LENS  IMPLANT, BILATERAL Bilateral 2013   lens implants for cataracts after Lasik   COLONOSCOPY     ENDOVEIN HARVEST OF GREATER SAPHENOUS VEIN Left 06/29/2020   Procedure: HARVEST OF LEFT GREATER SAPHENOUS VEIN;  Surgeon: [name], MD;  Location: [facility];  Service: Vascular;  Laterality: Left;   FEMORAL-POPLITEAL BYPASS GRAFT Left 06/29/2020   Procedure: BYPASS GRAFT LEFT COMMON FEMORAL TO ABOVE KNEE POPLITEAL ARTERY;  Surgeon: [name], MD;  Location: [facility];  Service: Vascular;  Laterality: Left;  HEMORRHOID SURGERY N/A 04/06/2021   Procedure: HEMORRHOIDECTOMY 2 COLUMN;  Surgeon: Ileana Roup, MD;  Location: [facility];  Service: General;  Laterality: N/A;   KNEE ARTHROSCOPY Right 1980   RECTAL EXAM UNDER ANESTHESIA N/A 04/06/2021   Procedure: ANORECTAL EXAM UNDER ANESTHESIA/ excision of anal polyp;  Surgeon: Ileana Roup, MD;   Location: [facility];  Service: General;  Laterality: N/A;   TONSILLECTOMY     at a very young age, but grew back   TOTAL HIP ARTHROPLASTY Right 05/25/2019   Procedure: RIGHT TOTAL HIP ARTHROPLASTY ANTERIOR APPROACH;  Surgeon: Leandrew Koyanagi, MD;  Location: [facility];  Service: Orthopedics;  Laterality: Right;   Family History  Problem Relation Age of Onset   Pulmonary fibrosis Mother    Breast cancer Mother    Hyperlipidemia Mother    Hypertension Mother    Other Mother        Scarlet Fever   Heart disease Father    Hyperlipidemia Father    Diabetes Brother    Hypertension Brother    Breast cancer Maternal Aunt    Prostate cancer Paternal Grandfather    Hypothyroidism Daughter    Colon cancer Neg Hx    Esophageal cancer Neg Hx    Ulcerative colitis Neg Hx    Uterine cancer Neg Hx    Outpatient Medications Prior to Visit  Medication Sig Dispense Refill   aspirin EC 81 MG tablet Take 81 mg by mouth daily. Swallow whole.     atorvastatin (LIPITOR) 40 MG tablet Take 1 tablet (40 mg total) by mouth daily. 90 tablet 3   tamsulosin (FLOMAX) 0.4 MG CAPS capsule TAKE 1 CAPSULE(0.4 MG) BY MOUTH IN THE MORNING 90 capsule 1   acetaminophen (TYLENOL) 325 MG tablet Take 650 mg by mouth every 6 (six) hours as needed.     ibuprofen (ADVIL) 200 MG tablet Take 200 mg by mouth every 6 (six) hours as needed. (Patient not taking: Reported on 08/16/2021)     No facility-administered medications prior to visit.   Allergies  Allergen Reactions   Anesthetics, Amide Nausea And Vomiting   Objective:   Today's Vitals   08/16/21 1028  BP: 130/74  Pulse: 89  Temp: 98.2 F (36.8 C)  TempSrc: Temporal  SpO2: 94%  Weight: 160 lb 9.6 oz (72.8 kg)  Height: '5\' 5"'$  (1.651 m)   Body mass index is 26.73 kg/m.   General: Well developed, well nourished. No acute distress. HEENT: Normocephalic, non-traumatic. PERRL, EOMI. Conjunctiva clear. External ears   normal. Right EAC obscured by wax.  Left EAC and TM normal. Nose    clear without congestion or rhinorrhea. Mucous membranes moist. Oropharynx clear. Neck: Supple. No lymphadenopathy. No thyromegaly. Lungs: Clear to auscultation bilaterally. No wheezing, rales or rhonchi. CV: RRR without murmurs or rubs. Pulses 2+ bilaterally. Abdomen: Soft, non-tender. Bowel sounds positive, normal pitch and frequency. No   hepatosplenomegaly. No rebound or guarding. Back: Straight. Well healed surgical scar in lumbar area. Extremities: Full ROM. No joint swelling. No edema noted. Skin: Warm and dry. No rashes. Psych: Alert and oriented. Normal mood and affect.  Health Maintenance Due  Topic Date Due   Zoster Vaccines- Shingrix (1 of 2) Never done   Pneumonia Vaccine 17+ Years old (2 - PPSV23 if available, else PCV20) 06/07/2020   EKG: Normal sinus rhythm (rate = 81).  Lab Results: Lab Results  Component Value Date   CHOL 87 01/11/2021   HDL 21.80 (  L) 01/11/2021   LDLCALC 54 01/11/2021   TRIG 57.0 01/11/2021   CHOLHDL 4 01/11/2021      Latest Ref Rng & Units 07/05/2021    8:29 AM 09/18/2020    2:57 PM 07/11/2020    8:47 AM  CBC  WBC 4.0 - 10.5 K/uL 14.1  13.8  16.6   Hemoglobin 13.0 - 17.0 g/dL 15.9  15.7  13.8   Hematocrit 39.0 - 52.0 % 47.8  46.5  41.4   Platelets 150.0 - 400.0 K/uL 173.0  215  221.0       Latest Ref Rng & Units 07/05/2021    8:29 AM 09/18/2020    2:57 PM 07/11/2020    8:47 AM  CMP  Glucose 70 - 99 mg/dL 111  106  138   BUN 6 - 23 mg/dL '14  19  26   '$ Creatinine 0.40 - 1.50 mg/dL 1.23  1.65  1.34   Sodium 135 - 145 mEq/L 138  138  137   Potassium 3.5 - 5.1 mEq/L 4.1  4.1  4.3   Chloride 96 - 112 mEq/L 106  107  103   CO2 19 - 32 mEq/L '24  23  23   '$ Calcium 8.4 - 10.5 mg/dL 9.1  8.7  8.7   Total Protein 6.0 - 8.3 g/dL 7.0  6.6    Total Bilirubin 0.2 - 1.2 mg/dL 0.5  0.2    Alkaline Phos 39 - 117 U/L 62  51    AST 0 - 37 U/L 15  19    ALT 0 - 53 U/L 23  19       Assessment & Plan:   1. Preoperative  examination Cleared for surgery under spinal anesthesia.  - EKG 12-Lead  2. Primary osteoarthritis of right knee Plan for right total knee joint replacement.  3. PAD (peripheral artery disease) ([place]) Prior aortogram shows adequate flow int he right leg, which should not preclude surgery at his point. Continue atorvastatin 40 mg daily and smoking cessation.  4. 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 I congratulated Mr. Kassis on his efforts to stop cigarette use. We discussed advantages of maintaining this, esp. around the time of his surgery to improve healing and reduce complications.  Return in about 10 weeks (around 10/25/2021) for Reassessment.   Haydee Salter, MD

## 2021-08-16 NOTE — Telephone Encounter (Signed)
[facility] faxed a preoperative clearance form for Dr Carlis Abbott. Pt will be having a right total knee arthroplasty on 09/04/21 with Dr Erlinda Hong with spinal anesthesia. Their office is requesting clearance and instructions for blood thinners. Form signed by Dr Carlis Abbott on 08/15/21 giving pt vascular clearance for this surgery with instructions to continue aspirin unless it is necessary to hold. If held, pt should restart aspirin as soon as possible after surgery.

## 2021-08-28 ENCOUNTER — Other Ambulatory Visit: Payer: Self-pay | Admitting: Physician Assistant

## 2021-08-28 MED ORDER — METHOCARBAMOL 750 MG PO TABS: 750.0000 mg | ORAL_TABLET | Freq: Two times a day (BID) | ORAL | 2 refills | Status: DC | PRN

## 2021-08-28 MED ORDER — ONDANSETRON HCL 4 MG PO TABS: 4.0000 mg | ORAL_TABLET | Freq: Three times a day (TID) | ORAL | 0 refills | Status: DC | PRN

## 2021-08-28 MED ORDER — DOCUSATE SODIUM 100 MG PO CAPS: 100.0000 mg | ORAL_CAPSULE | Freq: Every day | ORAL | 2 refills | Status: DC | PRN

## 2021-08-28 MED ORDER — OXYCODONE-ACETAMINOPHEN 5-325 MG PO TABS: 1.0000 | ORAL_TABLET | Freq: Four times a day (QID) | ORAL | 0 refills | Status: DC | PRN

## 2021-08-28 MED ORDER — ASPIRIN 81 MG PO TBEC: 81.0000 mg | DELAYED_RELEASE_TABLET | Freq: Two times a day (BID) | ORAL | 0 refills | Status: DC

## 2021-08-30 ENCOUNTER — Other Ambulatory Visit: Payer: Self-pay

## 2021-08-30 ENCOUNTER — Encounter (HOSPITAL_COMMUNITY)
Admission: RE | Admit: 2021-08-30 | Discharge: 2021-08-30 | Disposition: A | Discharge status: Home / Self Care | Payer: Medicare Other | Source: Ambulatory Visit | Attending: Orthopaedic Surgery | Admitting: Orthopaedic Surgery

## 2021-08-30 ENCOUNTER — Encounter (HOSPITAL_COMMUNITY): Payer: Self-pay

## 2021-08-30 VITALS — BP 119/74 | HR 91 | Temp 98.4°F | Resp 17 | Ht 65.0 in | Wt 160.3 lb

## 2021-08-30 DIAGNOSIS — M1711 Unilateral primary osteoarthritis, right knee: Secondary | ICD-10-CM | POA: Insufficient documentation

## 2021-08-30 DIAGNOSIS — Z01812 Encounter for preprocedural laboratory examination: Secondary | ICD-10-CM | POA: Diagnosis present

## 2021-08-30 DIAGNOSIS — R208 Other disturbances of skin sensation: Secondary | ICD-10-CM | POA: Insufficient documentation

## 2021-08-30 DIAGNOSIS — Z01818 Encounter for other preprocedural examination: Secondary | ICD-10-CM

## 2021-08-30 LAB — SURGICAL PCR SCREEN
MRSA, PCR: NEGATIVE
Staphylococcus aureus: NEGATIVE

## 2021-08-30 LAB — COMPREHENSIVE METABOLIC PANEL
ALT: 27 U/L (ref 0–44)
AST: 18 U/L (ref 15–41)
Albumin: 3.7 g/dL (ref 3.5–5.0)
Alkaline Phosphatase: 55 U/L (ref 38–126)
Anion gap: 8 (ref 5–15)
BUN: 10 mg/dL (ref 8–23)
CO2: 24 mmol/L (ref 22–32)
Calcium: 9 mg/dL (ref 8.9–10.3)
Chloride: 108 mmol/L (ref 98–111)
Creatinine, Ser: 1.33 mg/dL — ABNORMAL HIGH (ref 0.61–1.24)
GFR, Estimated: 58 mL/min — ABNORMAL LOW (ref >60)
Glucose, Bld: 101 mg/dL — ABNORMAL HIGH (ref 70–99)
Potassium: 4 mmol/L (ref 3.5–5.1)
Sodium: 140 mmol/L (ref 135–145)
Total Bilirubin: 0.7 mg/dL (ref 0.3–1.2)
Total Protein: 7.1 g/dL (ref 6.5–8.1)

## 2021-08-30 LAB — CBC
HCT: 50.5 % (ref 39.0–52.0)
Hemoglobin: 16.8 g/dL (ref 13.0–17.0)
MCH: 31.1 pg (ref 26.0–34.0)
MCHC: 33.3 g/dL (ref 30.0–36.0)
MCV: 93.5 fL (ref 80.0–100.0)
Platelets: 185 10*3/uL (ref 150–400)
RBC: 5.4 MIL/uL (ref 4.22–5.81)
RDW: 14.7 % (ref 11.5–15.5)
WBC: 15.3 10*3/uL — ABNORMAL HIGH (ref 4.0–10.5)
nRBC: 0 % (ref 0.0–0.2)

## 2021-08-30 NOTE — Progress Notes (Addendum)
PCP - Dr. Arlester Marker Cardiologist - Denies  Chest x-ray - NI EKG - 08/16/21 Stress Test - Denies ECHO - Denies Cardiac Cath - Denies  Sleep Study - Denies  DM - Denies  Blood Thinner Instructions:Denies Aspirin Instructions:Requested patient call Dr. Phoebe Sharps office for stop date  ERAS Protcol -Yes PRE-SURGERY Ensure given   Anesthesia review: yes WBC 15.3   Patient denies shortness of breath, fever, cough and chest pain at PAT appointment   All instructions explained to the patient, with a verbal understanding of the material. Patient agrees to go over the instructions while at home for a better understanding. The opportunity to ask questions was provided.

## 2021-08-30 NOTE — Progress Notes (Signed)
Surgical Instructions    Your procedure is scheduled on Monday July 17th.  Report to [facility] Main Entrance "A" at 6:30 A.M., then check in with the Admitting office.  Call this number if you have problems the morning of surgery:  [phone]   If you have any questions prior to your surgery date call [phone]: Open Monday-Friday 8am-4pm    Remember:  Do not eat after midnight the night before your surgery  You may drink clear liquids until 6am the morning of your surgery.   Clear liquids allowed are: Water, Non-Citrus Juices (without pulp), Carbonated Beverages, Clear Tea, Black Coffee ONLY (NO MILK, CREAM OR POWDERED CREAMER of any kind), and Gatorade   Enhanced Recovery after Surgery for Orthopedics Enhanced Recovery after Surgery is a protocol used to improve the stress on your body and your recovery after surgery.  Patient Instructions  The day of surgery (if you do NOT have diabetes):  Drink ONE (1) Pre-Surgery Clear Ensure by ___6__ am the morning of surgery   This drink was given to you during your hospital  pre-op appointment visit. Nothing else to drink after completing the  Pre-Surgery Clear Ensure.          If you have questions, please contact your surgeon's office.     Take these medicines the morning of surgery with A SIP OF WATER:  atorvastatin (LIPITOR) 40 MG tablet tamsulosin (FLOMAX) 0.4 MG CAPS capsule  IF NEEDED ondansetron (ZOFRAN) 4 MG tablet   Follow your surgeon's instructions on when to stop Aspirin.  If no instructions were given by your surgeon then you will need to call the office to get those instructions.    As of today, STOP taking any Aspirin (unless otherwise instructed by your surgeon) Aleve, Naproxen, Ibuprofen, Motrin, Advil, Goody's, BC's, all herbal medications, fish oil, and all vitamins.           Do not wear jewelry  Do not wear lotions, powders, colognes, or deodorant. Do not shave 48 hours prior to surgery.  Men may  shave face and neck. Do not bring valuables to the hospital. Do not wear nail polish  [facility] is not responsible for any belongings or valuables. .   Do NOT Smoke (Tobacco/Vaping)  24 hours prior to your procedure  If you use a CPAP at night, you may bring your mask for your overnight stay.   Contacts, glasses, hearing aids, dentures or partials may not be worn into surgery, please bring cases for these belongings   For patients admitted to the hospital, discharge time will be determined by your treatment team.   Patients discharged the day of surgery will not be allowed to drive home, and someone needs to stay with them for 24 hours.   SURGICAL WAITING ROOM VISITATION Patients having surgery or a procedure in a hospital may have two support people. Children under the age of 15 must have an adult with them who is not the patient. They may stay in the waiting area during the procedure and may switch out with other visitors. If the patient needs to stay at the hospital during part of their recovery, the visitor guidelines for inpatient rooms apply.  Please refer to the [facility] website for the visitor guidelines for Inpatients (after your surgery is over and you are in a regular room).       Special instructions:    Oral Hygiene is also important to reduce your risk of infection.  Remember - BRUSH  YOUR TEETH THE MORNING OF SURGERY WITH YOUR REGULAR TOOTHPASTE   [facility]- Preparing For Surgery  Before surgery, you can play an important role. Because skin is not sterile, your skin needs to be as free of germs as possible. You can reduce the number of germs on your skin by washing with CHG (chlorahexidine gluconate) Soap before surgery.  CHG is an antiseptic cleaner which kills germs and bonds with the skin to continue killing germs even after washing.     Please do not use if you have an allergy to CHG or antibacterial soaps. If your skin becomes reddened/irritated stop  using the CHG.  Do not shave (including legs and underarms) for at least 48 hours prior to first CHG shower. It is OK to shave your face.  Please follow these instructions carefully.     Shower the NIGHT BEFORE SURGERY and the MORNING OF SURGERY with CHG Soap.   If you chose to wash your hair, wash your hair first as usual with your normal shampoo. After you shampoo, rinse your hair and body thoroughly to remove the shampoo.  Then [employer] and genitals (private parts) with your normal soap and rinse thoroughly to remove soap.  After that Use CHG Soap as you would any other liquid soap. You can apply CHG directly to the skin and wash gently with a scrungie or a clean washcloth.   Apply the CHG Soap to your body ONLY FROM THE NECK DOWN.  Do not use on open wounds or open sores. Avoid contact with your eyes, ears, mouth and genitals (private parts). Wash Face and genitals (private parts)  with your normal soap.   Wash thoroughly, paying special attention to the area where your surgery will be performed.  Thoroughly rinse your body with warm water from the neck down.  DO NOT shower/wash with your normal soap after using and rinsing off the CHG Soap.  Pat yourself dry with a CLEAN TOWEL.  Wear CLEAN PAJAMAS to bed the night before surgery  Place CLEAN SHEETS on your bed the night before your surgery  DO NOT SLEEP WITH PETS.   Day of Surgery:  Take a shower with CHG soap. Wear Clean/Comfortable clothing the morning of surgery Do not apply any deodorants/lotions.   Remember to brush your teeth WITH YOUR REGULAR TOOTHPASTE.    If you received a COVID test during your pre-op visit, it is requested that you wear a mask when out in public, stay away from anyone that may not be feeling well, and notify your surgeon if you develop symptoms. If you have been in contact with anyone that has tested positive in the last 10 days, please notify your surgeon.    Please read over the following  fact sheets that you were given.

## 2021-09-01 MED ORDER — TRANEXAMIC ACID 1000 MG/10ML IV SOLN
2000.0000 mg | INTRAVENOUS | Status: DC
  Filled 2021-09-01: qty 20

## 2021-09-04 ENCOUNTER — Observation Stay (HOSPITAL_COMMUNITY)
Admission: RE | Admit: 2021-09-04 | Discharge: 2021-09-05 | Disposition: A | Discharge status: Home / Self Care | Payer: Medicare Other | Attending: Orthopaedic Surgery | Admitting: Orthopaedic Surgery

## 2021-09-04 ENCOUNTER — Other Ambulatory Visit: Payer: Self-pay

## 2021-09-04 ENCOUNTER — Encounter (HOSPITAL_COMMUNITY)
Admission: RE | Disposition: A | Discharge status: Home / Self Care | Payer: Self-pay | Source: Home / Self Care | Attending: Orthopaedic Surgery

## 2021-09-04 ENCOUNTER — Ambulatory Visit (HOSPITAL_BASED_OUTPATIENT_CLINIC_OR_DEPARTMENT_OTHER): Payer: Medicare Other | Admitting: Certified Registered Nurse Anesthetist

## 2021-09-04 ENCOUNTER — Observation Stay (HOSPITAL_COMMUNITY): Payer: Medicare Other

## 2021-09-04 ENCOUNTER — Ambulatory Visit (HOSPITAL_COMMUNITY): Payer: Medicare Other | Admitting: Physician Assistant

## 2021-09-04 ENCOUNTER — Encounter (HOSPITAL_COMMUNITY): Payer: Self-pay | Admitting: Orthopaedic Surgery

## 2021-09-04 DIAGNOSIS — Z96651 Presence of right artificial knee joint: Secondary | ICD-10-CM

## 2021-09-04 DIAGNOSIS — Z79899 Other long term (current) drug therapy: Secondary | ICD-10-CM | POA: Insufficient documentation

## 2021-09-04 DIAGNOSIS — Z7982 Long term (current) use of aspirin: Secondary | ICD-10-CM | POA: Diagnosis not present

## 2021-09-04 DIAGNOSIS — Z96641 Presence of right artificial hip joint: Secondary | ICD-10-CM | POA: Diagnosis not present

## 2021-09-04 DIAGNOSIS — F17210 Nicotine dependence, cigarettes, uncomplicated: Secondary | ICD-10-CM | POA: Insufficient documentation

## 2021-09-04 DIAGNOSIS — N289 Disorder of kidney and ureter, unspecified: Secondary | ICD-10-CM | POA: Diagnosis not present

## 2021-09-04 DIAGNOSIS — M1711 Unilateral primary osteoarthritis, right knee: Secondary | ICD-10-CM

## 2021-09-04 DIAGNOSIS — M25561 Pain in right knee: Secondary | ICD-10-CM | POA: Insufficient documentation

## 2021-09-04 DIAGNOSIS — I739 Peripheral vascular disease, unspecified: Secondary | ICD-10-CM

## 2021-09-04 HISTORY — PX: TOTAL KNEE ARTHROPLASTY: SHX125

## 2021-09-04 SURGERY — ARTHROPLASTY, KNEE, TOTAL
Anesthesia: Regional | Site: Knee | Laterality: Right

## 2021-09-04 MED ORDER — ORAL CARE MOUTH RINSE: 15.0000 mL | Freq: Once | OROMUCOSAL | Status: AC

## 2021-09-04 MED ORDER — FERROUS SULFATE 325 (65 FE) MG PO TABS
325.0000 mg | ORAL_TABLET | Freq: Three times a day (TID) | ORAL | Status: DC
  Administered 2021-09-04 – 2021-09-05 (×3): 325 mg via ORAL
  Filled 2021-09-04 (×3): qty 1

## 2021-09-04 MED ORDER — TRANEXAMIC ACID-NACL 1000-0.7 MG/100ML-% IV SOLN
1000.0000 mg | Freq: Once | INTRAVENOUS | Status: AC
  Administered 2021-09-04: 1000 mg via INTRAVENOUS
  Filled 2021-09-04: qty 100

## 2021-09-04 MED ORDER — TRANEXAMIC ACID 1000 MG/10ML IV SOLN
INTRAVENOUS | Status: DC | PRN
  Administered 2021-09-04: 2000 mg via TOPICAL

## 2021-09-04 MED ORDER — PROPOFOL 10 MG/ML IV BOLUS
INTRAVENOUS | Status: AC
  Filled 2021-09-04: qty 20

## 2021-09-04 MED ORDER — LACTATED RINGERS IV SOLN: INTRAVENOUS | Status: DC

## 2021-09-04 MED ORDER — DEXAMETHASONE SODIUM PHOSPHATE 10 MG/ML IJ SOLN
INTRAMUSCULAR | Status: DC | PRN
  Administered 2021-09-04: 5 mg via INTRAVENOUS

## 2021-09-04 MED ORDER — LIDOCAINE 2% (20 MG/ML) 5 ML SYRINGE
INTRAMUSCULAR | Status: DC | PRN
  Administered 2021-09-04: 40 mg via INTRAVENOUS

## 2021-09-04 MED ORDER — FENTANYL CITRATE (PF) 100 MCG/2ML IJ SOLN: 100.0000 ug | Freq: Once | INTRAMUSCULAR | Status: AC

## 2021-09-04 MED ORDER — OXYCODONE HCL 5 MG PO TABS: 5.0000 mg | ORAL_TABLET | Freq: Once | ORAL | Status: DC | PRN

## 2021-09-04 MED ORDER — VANCOMYCIN HCL 1000 MG IV SOLR
INTRAVENOUS | Status: DC | PRN
  Administered 2021-09-04: 1000 mg via TOPICAL

## 2021-09-04 MED ORDER — METOCLOPRAMIDE HCL 5 MG PO TABS: 5.0000 mg | ORAL_TABLET | Freq: Three times a day (TID) | ORAL | Status: DC | PRN

## 2021-09-04 MED ORDER — OXYCODONE HCL 5 MG PO TABS: 10.0000 mg | ORAL_TABLET | ORAL | Status: DC | PRN

## 2021-09-04 MED ORDER — KETOROLAC TROMETHAMINE 15 MG/ML IJ SOLN
7.5000 mg | Freq: Four times a day (QID) | INTRAMUSCULAR | Status: AC
  Administered 2021-09-04 – 2021-09-05 (×4): 7.5 mg via INTRAVENOUS
  Filled 2021-09-04 (×4): qty 1

## 2021-09-04 MED ORDER — ACETAMINOPHEN 325 MG PO TABS: 325.0000 mg | ORAL_TABLET | Freq: Four times a day (QID) | ORAL | Status: DC | PRN

## 2021-09-04 MED ORDER — MIDAZOLAM HCL 2 MG/2ML IJ SOLN: 2.0000 mg | Freq: Once | INTRAMUSCULAR | Status: AC

## 2021-09-04 MED ORDER — LACTATED RINGERS IV SOLN
INTRAVENOUS | Status: DC
Start: 2021-09-04 — End: 2021-09-04

## 2021-09-04 MED ORDER — CEFAZOLIN SODIUM-DEXTROSE 2-4 GM/100ML-% IV SOLN
2.0000 g | INTRAVENOUS | Status: AC
  Administered 2021-09-04: 2 g via INTRAVENOUS
  Filled 2021-09-04: qty 100

## 2021-09-04 MED ORDER — SODIUM CHLORIDE 0.9 % IV SOLN: INTRAVENOUS | Status: DC

## 2021-09-04 MED ORDER — TRANEXAMIC ACID-NACL 1000-0.7 MG/100ML-% IV SOLN
1000.0000 mg | INTRAVENOUS | Status: AC
  Administered 2021-09-04: 1000 mg via INTRAVENOUS
  Filled 2021-09-04: qty 100

## 2021-09-04 MED ORDER — OXYCODONE HCL 5 MG/5ML PO SOLN: 5.0000 mg | Freq: Once | ORAL | Status: DC | PRN

## 2021-09-04 MED ORDER — BUPIVACAINE-MELOXICAM ER 400-12 MG/14ML IJ SOLN
INTRAMUSCULAR | Status: DC | PRN
  Administered 2021-09-04: 400 mg

## 2021-09-04 MED ORDER — LIDOCAINE 2% (20 MG/ML) 5 ML SYRINGE
INTRAMUSCULAR | Status: AC
Start: 2021-09-04 — End: ?
  Filled 2021-09-04: qty 5

## 2021-09-04 MED ORDER — ONDANSETRON HCL 4 MG/2ML IJ SOLN: 4.0000 mg | Freq: Four times a day (QID) | INTRAMUSCULAR | Status: DC | PRN

## 2021-09-04 MED ORDER — ASPIRIN 81 MG PO CHEW
81.0000 mg | CHEWABLE_TABLET | Freq: Two times a day (BID) | ORAL | Status: DC
  Administered 2021-09-04 – 2021-09-05 (×2): 81 mg via ORAL
  Filled 2021-09-04 (×2): qty 1

## 2021-09-04 MED ORDER — HYDROMORPHONE HCL 1 MG/ML IJ SOLN: 0.5000 mg | INTRAMUSCULAR | Status: DC | PRN

## 2021-09-04 MED ORDER — FENTANYL CITRATE (PF) 250 MCG/5ML IJ SOLN
INTRAMUSCULAR | Status: AC
  Filled 2021-09-04: qty 5

## 2021-09-04 MED ORDER — METHOCARBAMOL 1000 MG/10ML IJ SOLN: 500.0000 mg | Freq: Four times a day (QID) | INTRAVENOUS | Status: DC | PRN

## 2021-09-04 MED ORDER — EPHEDRINE SULFATE-NACL 50-0.9 MG/10ML-% IV SOSY
PREFILLED_SYRINGE | INTRAVENOUS | Status: DC | PRN
  Administered 2021-09-04 (×4): 5 mg via INTRAVENOUS

## 2021-09-04 MED ORDER — FENTANYL CITRATE (PF) 100 MCG/2ML IJ SOLN: 25.0000 ug | INTRAMUSCULAR | Status: DC | PRN

## 2021-09-04 MED ORDER — ACETAMINOPHEN 500 MG PO TABS
1000.0000 mg | ORAL_TABLET | Freq: Four times a day (QID) | ORAL | Status: AC
  Administered 2021-09-04 – 2021-09-05 (×4): 1000 mg via ORAL
  Filled 2021-09-04 (×4): qty 2

## 2021-09-04 MED ORDER — FENTANYL CITRATE (PF) 100 MCG/2ML IJ SOLN
INTRAMUSCULAR | Status: AC
  Administered 2021-09-04: 100 ug via INTRAVENOUS
  Filled 2021-09-04: qty 2

## 2021-09-04 MED ORDER — METHOCARBAMOL 500 MG PO TABS: 500.0000 mg | ORAL_TABLET | Freq: Four times a day (QID) | ORAL | Status: DC | PRN

## 2021-09-04 MED ORDER — FENTANYL CITRATE (PF) 250 MCG/5ML IJ SOLN
INTRAMUSCULAR | Status: DC | PRN
  Administered 2021-09-04 (×6): 25 ug via INTRAVENOUS

## 2021-09-04 MED ORDER — OXYCODONE HCL 5 MG PO TABS: 5.0000 mg | ORAL_TABLET | ORAL | Status: DC | PRN

## 2021-09-04 MED ORDER — ONDANSETRON HCL 4 MG/2ML IJ SOLN
INTRAMUSCULAR | Status: AC
  Filled 2021-09-04: qty 2

## 2021-09-04 MED ORDER — PHENYLEPHRINE 80 MCG/ML (10ML) SYRINGE FOR IV PUSH (FOR BLOOD PRESSURE SUPPORT)
PREFILLED_SYRINGE | INTRAVENOUS | Status: AC
  Filled 2021-09-04: qty 10

## 2021-09-04 MED ORDER — DEXAMETHASONE SODIUM PHOSPHATE 10 MG/ML IJ SOLN
INTRAMUSCULAR | Status: AC
  Filled 2021-09-04: qty 1

## 2021-09-04 MED ORDER — 0.9 % SODIUM CHLORIDE (POUR BTL) OPTIME
TOPICAL | Status: DC | PRN
  Administered 2021-09-04: 1000 mL

## 2021-09-04 MED ORDER — PHENYLEPHRINE 80 MCG/ML (10ML) SYRINGE FOR IV PUSH (FOR BLOOD PRESSURE SUPPORT)
PREFILLED_SYRINGE | INTRAVENOUS | Status: DC | PRN
  Administered 2021-09-04 (×2): 80 ug via INTRAVENOUS

## 2021-09-04 MED ORDER — MENTHOL 3 MG MT LOZG: 1.0000 | LOZENGE | OROMUCOSAL | Status: DC | PRN

## 2021-09-04 MED ORDER — PROPOFOL 500 MG/50ML IV EMUL
INTRAVENOUS | Status: DC | PRN
  Administered 2021-09-04: 25 ug/kg/min via INTRAVENOUS

## 2021-09-04 MED ORDER — VANCOMYCIN HCL 1000 MG IV SOLR
INTRAVENOUS | Status: AC
  Filled 2021-09-04: qty 20

## 2021-09-04 MED ORDER — CHLORHEXIDINE GLUCONATE 0.12 % MT SOLN
15.0000 mL | Freq: Once | OROMUCOSAL | Status: AC
  Administered 2021-09-04: 15 mL via OROMUCOSAL
  Filled 2021-09-04: qty 15

## 2021-09-04 MED ORDER — DEXAMETHASONE SODIUM PHOSPHATE 10 MG/ML IJ SOLN
INTRAMUSCULAR | Status: AC
Start: 2021-09-04 — End: ?
  Filled 2021-09-04: qty 1

## 2021-09-04 MED ORDER — CEFAZOLIN SODIUM-DEXTROSE 2-4 GM/100ML-% IV SOLN
2.0000 g | Freq: Four times a day (QID) | INTRAVENOUS | Status: AC
  Administered 2021-09-04 (×2): 2 g via INTRAVENOUS
  Filled 2021-09-04 (×2): qty 100

## 2021-09-04 MED ORDER — METOCLOPRAMIDE HCL 5 MG/ML IJ SOLN: 5.0000 mg | Freq: Three times a day (TID) | INTRAMUSCULAR | Status: DC | PRN

## 2021-09-04 MED ORDER — SODIUM CHLORIDE 0.9 % IR SOLN
Status: DC | PRN
  Administered 2021-09-04: 1000 mL

## 2021-09-04 MED ORDER — ROPIVACAINE HCL 5 MG/ML IJ SOLN
INTRAMUSCULAR | Status: DC | PRN
  Administered 2021-09-04: 25 mL via PERINEURAL

## 2021-09-04 MED ORDER — ROCURONIUM BROMIDE 10 MG/ML (PF) SYRINGE
PREFILLED_SYRINGE | INTRAVENOUS | Status: AC
  Filled 2021-09-04: qty 10

## 2021-09-04 MED ORDER — ONDANSETRON HCL 4 MG/2ML IJ SOLN
INTRAMUSCULAR | Status: DC | PRN
  Administered 2021-09-04: 4 mg via INTRAVENOUS

## 2021-09-04 MED ORDER — MIDAZOLAM HCL 2 MG/2ML IJ SOLN
INTRAMUSCULAR | Status: AC
  Administered 2021-09-04: 2 mg via INTRAVENOUS
  Filled 2021-09-04: qty 2

## 2021-09-04 MED ORDER — PHENYLEPHRINE HCL-NACL 20-0.9 MG/250ML-% IV SOLN
INTRAVENOUS | Status: DC | PRN
  Administered 2021-09-04: 20 ug/min via INTRAVENOUS

## 2021-09-04 MED ORDER — ONDANSETRON HCL 4 MG PO TABS: 4.0000 mg | ORAL_TABLET | Freq: Four times a day (QID) | ORAL | Status: DC | PRN

## 2021-09-04 MED ORDER — OXYCODONE HCL ER 10 MG PO T12A
10.0000 mg | EXTENDED_RELEASE_TABLET | Freq: Two times a day (BID) | ORAL | Status: DC
  Administered 2021-09-04 (×2): 10 mg via ORAL
  Filled 2021-09-04 (×2): qty 1

## 2021-09-04 MED ORDER — PHENOL 1.4 % MT LIQD
1.0000 | OROMUCOSAL | Status: DC | PRN
Start: 2021-09-04 — End: 2021-09-05

## 2021-09-04 MED ORDER — DOCUSATE SODIUM 100 MG PO CAPS
100.0000 mg | ORAL_CAPSULE | Freq: Two times a day (BID) | ORAL | Status: DC
  Administered 2021-09-04 – 2021-09-05 (×3): 100 mg via ORAL
  Filled 2021-09-04 (×3): qty 1

## 2021-09-04 MED ORDER — DEXAMETHASONE SODIUM PHOSPHATE 10 MG/ML IJ SOLN: 10.0000 mg | Freq: Once | INTRAMUSCULAR | Status: DC

## 2021-09-04 MED ORDER — EPHEDRINE 5 MG/ML INJ
INTRAVENOUS | Status: AC
  Filled 2021-09-04: qty 5

## 2021-09-04 MED ORDER — PROPOFOL 10 MG/ML IV BOLUS
INTRAVENOUS | Status: DC | PRN
  Administered 2021-09-04: 170 mg via INTRAVENOUS

## 2021-09-04 MED ORDER — POVIDONE-IODINE 10 % EX SWAB
2.0000 | Freq: Once | CUTANEOUS | Status: AC
Start: 2021-09-04 — End: 2021-09-04
  Administered 2021-09-04: 2 via TOPICAL

## 2021-09-04 MED ORDER — LIDOCAINE 2% (20 MG/ML) 5 ML SYRINGE
INTRAMUSCULAR | Status: AC
  Filled 2021-09-04: qty 5

## 2021-09-04 MED ORDER — BUPIVACAINE-MELOXICAM ER 400-12 MG/14ML IJ SOLN
INTRAMUSCULAR | Status: AC
  Filled 2021-09-04: qty 1

## 2021-09-04 SURGICAL SUPPLY — 82 items
ALCOHOL 70% 16 OZ (MISCELLANEOUS) ×2 IMPLANT
BAG COUNTER SPONGE SURGICOUNT (BAG) IMPLANT
BAG DECANTER FOR FLEXI CONT (MISCELLANEOUS) ×2 IMPLANT
BANDAGE ESMARK 6X9 LF (GAUZE/BANDAGES/DRESSINGS) IMPLANT
BLADE SAG 18X100X1.27 (BLADE) ×2 IMPLANT
BNDG ESMARK 6X9 LF (GAUZE/BANDAGES/DRESSINGS)
BOWL SMART MIX CTS (DISPOSABLE) ×2 IMPLANT
CEMENT BONE REFOBACIN R1X40 US (Cement) ×1 IMPLANT
CLSR STERI-STRIP ANTIMIC 1/2X4 (GAUZE/BANDAGES/DRESSINGS) ×4 IMPLANT
COMP FEM STD PS 8 RT (Joint) ×2 IMPLANT
COMPONENT FEM STD PS 8 RT (Joint) IMPLANT
COOLER ICEMAN CLASSIC (MISCELLANEOUS) ×2 IMPLANT
COVER SURGICAL LIGHT HANDLE (MISCELLANEOUS) ×2 IMPLANT
CUFF TOURN SGL QUICK 34 (TOURNIQUET CUFF) ×1
CUFF TOURN SGL QUICK 42 (TOURNIQUET CUFF) IMPLANT
CUFF TRNQT CYL 34X4.125X (TOURNIQUET CUFF) ×1 IMPLANT
DERMABOND ADVANCED (GAUZE/BANDAGES/DRESSINGS) ×1
DERMABOND ADVANCED .7 DNX12 (GAUZE/BANDAGES/DRESSINGS) ×1 IMPLANT
DRAPE EXTREMITY T 121X128X90 (DISPOSABLE) ×2 IMPLANT
DRAPE HALF SHEET 40X57 (DRAPES) ×2 IMPLANT
DRAPE INCISE IOBAN 66X45 STRL (DRAPES) ×2 IMPLANT
DRAPE ORTHO SPLIT 77X108 STRL (DRAPES) ×2
DRAPE POUCH INSTRU U-SHP 10X18 (DRAPES) ×2 IMPLANT
DRAPE SURG ORHT 6 SPLT 77X108 (DRAPES) ×2 IMPLANT
DRAPE U-SHAPE 47X51 STRL (DRAPES) ×4 IMPLANT
DRSG AQUACEL AG ADV 3.5X10 (GAUZE/BANDAGES/DRESSINGS) ×2 IMPLANT
DURAPREP 26ML APPLICATOR (WOUND CARE) ×6 IMPLANT
ELECT CAUTERY BLADE 6.4 (BLADE) ×2 IMPLANT
ELECT REM PT RETURN 9FT ADLT (ELECTROSURGICAL) ×2
ELECTRODE REM PT RTRN 9FT ADLT (ELECTROSURGICAL) ×1 IMPLANT
GLOVE BIOGEL PI IND STRL 7.0 (GLOVE) ×1 IMPLANT
GLOVE BIOGEL PI IND STRL 7.5 (GLOVE) ×4 IMPLANT
GLOVE BIOGEL PI INDICATOR 7.0 (GLOVE) ×1
GLOVE BIOGEL PI INDICATOR 7.5 (GLOVE) ×4
GLOVE ECLIPSE 7.0 STRL STRAW (GLOVE) ×6 IMPLANT
GLOVE SKINSENSE NS SZ7.5 (GLOVE) ×3
GLOVE SKINSENSE STRL SZ7.5 (GLOVE) ×3 IMPLANT
GLOVE SURG UNDER LTX SZ7.5 (GLOVE) ×4 IMPLANT
GLOVE SURG UNDER POLY LF SZ7 (GLOVE) ×4 IMPLANT
GOWN STRL REIN XL XLG (GOWN DISPOSABLE) ×2 IMPLANT
GOWN STRL REUS W/ TWL LRG LVL3 (GOWN DISPOSABLE) ×1 IMPLANT
GOWN STRL REUS W/TWL LRG LVL3 (GOWN DISPOSABLE) ×1
HANDPIECE INTERPULSE COAX TIP (DISPOSABLE) ×1
HDLS TROCR DRIL PIN KNEE 75 (PIN) ×1
HOOD PEEL AWAY FLYTE STAYCOOL (MISCELLANEOUS) ×4 IMPLANT
INSERT TIB ARTISURF SZ8-11X12 (Insert) ×1 IMPLANT
JET LAVAGE IRRISEPT WOUND (IRRIGATION / IRRIGATOR) ×2
KIT BASIN OR (CUSTOM PROCEDURE TRAY) ×2 IMPLANT
KIT TURNOVER KIT B (KITS) ×2 IMPLANT
LAVAGE JET IRRISEPT WOUND (IRRIGATION / IRRIGATOR) ×1 IMPLANT
MANIFOLD NEPTUNE II (INSTRUMENTS) ×2 IMPLANT
MARKER SKIN DUAL TIP RULER LAB (MISCELLANEOUS) ×4 IMPLANT
NDL SPNL 18GX3.5 QUINCKE PK (NEEDLE) ×1 IMPLANT
NEEDLE SPNL 18GX3.5 QUINCKE PK (NEEDLE) ×2 IMPLANT
NS IRRIG 1000ML POUR BTL (IV SOLUTION) ×2 IMPLANT
PACK TOTAL JOINT (CUSTOM PROCEDURE TRAY) ×2 IMPLANT
PAD ARMBOARD 7.5X6 YLW CONV (MISCELLANEOUS) ×4 IMPLANT
PAD COLD SHLDR WRAP-ON (PAD) ×2 IMPLANT
PENCIL BUTTON HOLSTER BLD 10FT (ELECTRODE) ×1 IMPLANT
PIN DRILL HDLS TROCAR 75 4PK (PIN) IMPLANT
PROS TIB KNEE PS 0D F RT (Joint) ×2 IMPLANT
PROSTHESIS TIB KNEE PS 0D F RT (Joint) IMPLANT
SAW OSC TIP CART 19.5X105X1.3 (SAW) ×2 IMPLANT
SCREW FEMALE HEX FIX 25X2.5 (ORTHOPEDIC DISPOSABLE SUPPLIES) ×1 IMPLANT
SET HNDPC FAN SPRY TIP SCT (DISPOSABLE) ×1 IMPLANT
STAPLER VISISTAT 35W (STAPLE) IMPLANT
STEM POLY PAT PLY 35M KNEE (Knees) ×1 IMPLANT
SUCTION FRAZIER HANDLE 10FR (MISCELLANEOUS) ×1
SUCTION TUBE FRAZIER 10FR DISP (MISCELLANEOUS) ×1 IMPLANT
SUT ETHILON 2 0 FS 18 (SUTURE) IMPLANT
SUT MNCRL AB 3-0 PS2 27 (SUTURE) IMPLANT
SUT VIC AB 0 CT1 27 (SUTURE) ×2
SUT VIC AB 0 CT1 27XBRD ANBCTR (SUTURE) ×2 IMPLANT
SUT VIC AB 1 CTX 27 (SUTURE) ×6 IMPLANT
SUT VIC AB 2-0 CT1 27 (SUTURE) ×4
SUT VIC AB 2-0 CT1 TAPERPNT 27 (SUTURE) ×4 IMPLANT
SYR 50ML LL SCALE MARK (SYRINGE) ×4 IMPLANT
TOWEL GREEN STERILE (TOWEL DISPOSABLE) ×2 IMPLANT
TOWEL GREEN STERILE FF (TOWEL DISPOSABLE) ×2 IMPLANT
TRAY CATH 16FR W/PLASTIC CATH (SET/KITS/TRAYS/PACK) IMPLANT
UNDERPAD 30X36 HEAVY ABSORB (UNDERPADS AND DIAPERS) ×2 IMPLANT
YANKAUER SUCT BULB TIP NO VENT (SUCTIONS) ×4 IMPLANT

## 2021-09-04 NOTE — Anesthesia Procedure Notes (Signed)
Procedure Name: LMA Insertion Date/Time: 09/04/2021 9:18 AM  Performed by: Janene Harvey, CRNAPre-anesthesia Checklist: Patient identified, Emergency Drugs available, Suction available and Patient being monitored Patient Re-evaluated:Patient Re-evaluated prior to induction Oxygen Delivery Method: Circle system utilized Preoxygenation: Pre-oxygenation with 100% oxygen Induction Type: IV induction LMA: LMA inserted LMA Size: 4.0 Placement Confirmation: positive ETCO2 Dental Injury: Teeth and Oropharynx as per pre-operative assessment

## 2021-09-04 NOTE — Evaluation (Signed)
Physical Therapy Evaluation Patient Details Name: Gerald Carpenter MRN: [id number] DOB: February 05, 1951 Today's Date: 09/04/2021  History of Present Illness  Pt is a 71 y/o male s/p R TKA. PMH includes PVD, R THA, and back surgery.  Clinical Impression  Pt admitted secondary to problem above with deficits below. Pt requiring min guard to min A for mobility tasks using RW. Pt with nerve block effects still present, so mobility limited to chair. Reviewed knee precautions with pt. Will continue to follow acutely to maximize functional mobility independence and safety.        Recommendations for follow up therapy are one component of a multi-disciplinary discharge planning process, led by the attending physician.  Recommendations may be updated based on patient status, additional functional criteria and insurance authorization.  Follow Up Recommendations Follow physician's recommendations for discharge plan and follow up therapies      Assistance Recommended at Discharge Intermittent Supervision/Assistance  Patient can return home with the following  Assistance with cooking/housework;Assist for transportation    Equipment Recommendations None recommended by PT  Recommendations for Other Services       Functional Status Assessment Patient has had a recent decline in their functional status and demonstrates the ability to make significant improvements in function in a reasonable and predictable amount of time.     Precautions / Restrictions Precautions Precautions: Knee Precaution Booklet Issued: No Precaution Comments: Reviewed knee precautions with pt Restrictions Weight Bearing Restrictions: Yes RLE Weight Bearing: Weight bearing as tolerated      Mobility  Bed Mobility Overal bed mobility: Needs Assistance Bed Mobility: Supine to Sit     Supine to sit: Min assist     General bed mobility comments: assist for RLE management    Transfers Overall transfer level: Needs  assistance Equipment used: Rolling walker (2 wheels) Transfers: Sit to/from Stand, Bed to chair/wheelchair/BSC Sit to Stand: Min assist Stand pivot transfers: Min guard         General transfer comment: Min A for steadying to stand. Min guard for safety to transfer to chair. Some nerve block effects still present, so further mobility deferred.    Ambulation/Gait                  Stairs            Wheelchair Mobility    Modified Rankin (Stroke Patients Only)       Balance Overall balance assessment: Needs assistance Sitting-balance support: No upper extremity supported, Feet supported Sitting balance-Leahy Scale: Fair     Standing balance support: Bilateral upper extremity supported Standing balance-Leahy Scale: Poor Standing balance comment: Reliant on BUE support                             Pertinent Vitals/Pain Pain Assessment Pain Assessment: 0-10 Pain Score: 2  Pain Location: R knee Pain Descriptors / Indicators: Grimacing, Guarding Pain Intervention(s): Limited activity within patient's tolerance, Monitored during session, Repositioned    Home Living Family/patient expects to be discharged to:: Private residence Living Arrangements: Spouse/significant other Available Help at Discharge: Family;Available 24 hours/day Type of Home: House Home Access: Stairs to enter Entrance Stairs-Rails: None Entrance Stairs-Number of Steps: 2   Home Layout: Two level;Able to live on main level with bedroom/bathroom Home Equipment: BSC/3in1;Rolling Walker (2 wheels);Cane - single point      Prior Function Prior Level of Function : Independent/Modified Independent  Hand Dominance   Dominant Hand: Right    Extremity/Trunk Assessment   Upper Extremity Assessment Upper Extremity Assessment: Defer to OT evaluation    Lower Extremity Assessment Lower Extremity Assessment: RLE deficits/detail RLE Deficits / Details:  Deficits consistent with post op pain and weakness. Some nerve block effects noted as pt with extensor lag.    Cervical / Trunk Assessment Cervical / Trunk Assessment: Normal  Communication   Communication: No difficulties  Cognition Arousal/Alertness: Awake/alert Behavior During Therapy: WFL for tasks assessed/performed Overall Cognitive Status: Within Functional Limits for tasks assessed                                          General Comments      Exercises     Assessment/Plan    PT Assessment Patient needs continued PT services  PT Problem List Decreased strength;Decreased range of motion;Decreased activity tolerance;Decreased balance;Decreased mobility;Decreased knowledge of use of DME;Decreased knowledge of precautions       PT Treatment Interventions DME instruction;Gait training;Functional mobility training;Stair training;Therapeutic activities;Therapeutic exercise;Balance training;Patient/family education    PT Goals (Current goals can be found in the Care Plan section)  Acute Rehab PT Goals Patient Stated Goal: to go home PT Goal Formulation: With patient Time For Goal Achievement: 09/18/21 Potential to Achieve Goals: Good    Frequency 7X/week     Co-evaluation               AM-PAC PT "6 Clicks" Mobility  Outcome Measure Help needed turning from your back to your side while in a flat bed without using bedrails?: A Little Help needed moving from lying on your back to sitting on the side of a flat bed without using bedrails?: A Little Help needed moving to and from a bed to a chair (including a wheelchair)?: A Little Help needed standing up from a chair using your arms (e.g., wheelchair or bedside chair)?: A Little Help needed to walk in hospital room?: A Little Help needed climbing 3-5 steps with a railing? : A Lot 6 Click Score: 17    End of Session Equipment Utilized During Treatment: Gait belt Activity Tolerance: Patient  tolerated treatment well Patient left: in chair;with call bell/phone within reach Nurse Communication: Mobility status PT Visit Diagnosis: Other abnormalities of gait and mobility (R26.89);Difficulty in walking, not elsewhere classified (R26.2);Pain Pain - Right/Left: Right Pain - part of body: Knee    Time: 5809-9833 PT Time Calculation (min) (ACUTE ONLY): 17 min   Charges:   PT Evaluation $PT Eval Low Complexity: 1 Low          Reuel Derby, PT, DPT  Acute Rehabilitation Services  Office: [phone]   Rudean Hitt 09/04/2021, 3:10 PM

## 2021-09-04 NOTE — Anesthesia Procedure Notes (Signed)
Anesthesia Regional Block: Adductor canal block   Pre-Anesthetic Checklist: , timeout performed,  Correct Patient, Correct Site, Correct Laterality,  Correct Procedure, Correct Position, site marked,  Risks and benefits discussed,  Surgical consent,  Pre-op evaluation,  At surgeon's request and post-op pain management  Laterality: Right  Prep: chloraprep       Needles:  Injection technique: Single-shot  Needle Type: Echogenic Needle     Needle Length: 9cm  Needle Gauge: 21     Additional Needles:   Narrative:  Start time: 09/04/2021 8:07 AM End time: 09/04/2021 8:17 AM Injection made incrementally with aspirations every 5 mL.  Performed by: Personally  Anesthesiologist: Albertha Ghee, MD  Additional Notes: Pt tolerated the procedure well.

## 2021-09-04 NOTE — Transfer of Care (Signed)
Immediate Anesthesia Transfer of Care Note  Patient: Jarell Mcewen  Procedure(s) Performed: RIGHT TOTAL KNEE ARTHROPLASTY (Right: Knee)  Patient Location: PACU  Anesthesia Type:GA combined with regional for post-op pain  Level of Consciousness: awake, drowsy and patient cooperative  Airway & Oxygen Therapy: Patient Spontanous Breathing  Post-op Assessment: Report given to RN, Post -op Vital signs reviewed and stable and Patient moving all extremities X 4  Post vital signs: Reviewed and stable  Last Vitals:  Vitals Value Taken Time  BP    Temp    Pulse 73 09/04/21 1118  Resp 14 09/04/21 1118  SpO2 94 % 09/04/21 1118  Vitals shown include unvalidated device data.  Last Pain:  Vitals:   09/04/21 0840  TempSrc:   PainSc: 0-No pain         Complications: No notable events documented.

## 2021-09-04 NOTE — Discharge Instructions (Signed)

## 2021-09-04 NOTE — Anesthesia Preprocedure Evaluation (Addendum)
Anesthesia Evaluation  Patient identified by MRN, date of birth, ID band Patient awake    Reviewed: Allergy & Precautions, H&P , NPO status , Patient's Chart, lab work & pertinent test results  History of Anesthesia Complications (+) PONV and history of anesthetic complications  Airway Mallampati: II   Neck ROM: full    Dental   Pulmonary Current Smoker and Patient abstained from smoking.,    breath sounds clear to auscultation       Cardiovascular + Peripheral Vascular Disease   Rhythm:regular Rate:Normal     Neuro/Psych    GI/Hepatic (+)     substance abuse  alcohol use,   Endo/Other    Renal/GU Renal InsufficiencyRenal disease     Musculoskeletal  (+) Arthritis ,   Abdominal   Peds  Hematology   Anesthesia Other Findings   Reproductive/Obstetrics                             Anesthesia Physical Anesthesia Plan  ASA: 3  Anesthesia Plan: General   Post-op Pain Management: Regional block*   Induction: Intravenous  PONV Risk Score and Plan: 2 and Treatment may vary due to age or medical condition, Midazolam, Dexamethasone and Ondansetron  Airway Management Planned: Simple Face Mask  Additional Equipment:   Intra-op Plan:   Post-operative Plan:   Informed Consent: I have reviewed the patients History and Physical, chart, labs and discussed the procedure including the risks, benefits and alternatives for the proposed anesthesia with the patient or authorized representative who has indicated his/her understanding and acceptance.     Dental advisory given  Plan Discussed with: CRNA, Anesthesiologist and Surgeon  Anesthesia Plan Comments: (Pt declined option for spinal anesthetic.)       Anesthesia Quick Evaluation

## 2021-09-04 NOTE — H&P (Signed)
PREOPERATIVE H&P  Chief Complaint: RIGHT KNEE DEGENERATIVE JOINT DISEASE  HPI: Gerald Carpenter is a 71 y.o. male who presents for surgical treatment of RIGHT KNEE DEGENERATIVE JOINT DISEASE.  He denies any changes in medical history.  Past Medical History:  Diagnosis Date   Alcoholism ([place])    Arthritis    Cataract    Colon polyps    Complication of anesthesia    HLD (hyperlipidemia)    Nephritis    when pt. was 7 yrs. ols   Peripheral vascular disease (HCC)    PONV (postoperative nausea and vomiting)    "Only when I have aortagram on 06/23/20".   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   Substance abuse [facility])    Past Surgical History:  Procedure Laterality Date   ABDOMINAL AORTOGRAM W/LOWER EXTREMITY N/A 06/23/2020   Procedure: ABDOMINAL AORTOGRAM W/LOWER EXTREMITY;  Surgeon: [name], MD;  Location: [facility];  Service: Cardiovascular;  Laterality: N/A;   ANTERIOR LAT LUMBAR FUSION Right 02/05/2019   Procedure: ATTEMPTED ANTERIOR LATERAL INTERBODY FUSION;  Surgeon: Consuella Lose, MD;  Location: [place];  Service: Neurosurgery;  Laterality: Right;  RIGHT LATERAL TRANSPSOAS DISCECTOMY AND FUSION WITH ROBOTIC ASSISTED PEDICLE SCREW STABILIZATION, LUMBAR 4- LUMBAR 5   CATARACT EXTRACTION W/ INTRAOCULAR LENS  IMPLANT, BILATERAL Bilateral 2013   lens implants for cataracts after Lasik   COLONOSCOPY     ENDOVEIN HARVEST OF GREATER SAPHENOUS VEIN Left 06/29/2020   Procedure: HARVEST OF LEFT GREATER SAPHENOUS VEIN;  Surgeon: [name], MD;  Location: [place];  Service: Vascular;  Laterality: Left;   FEMORAL-POPLITEAL BYPASS GRAFT Left 06/29/2020   Procedure: BYPASS GRAFT LEFT COMMON FEMORAL TO ABOVE KNEE POPLITEAL ARTERY;  Surgeon: [name], MD;  Location: [place];  Service: Vascular;  Laterality: Left;   HEMORRHOID SURGERY N/A 04/06/2021   Procedure: HEMORRHOIDECTOMY 2 COLUMN;  Surgeon: Ileana Roup, MD;  Location: [place];  Service: General;  Laterality: N/A;   KNEE ARTHROSCOPY Right [place] N/A 04/06/2021   Procedure: ANORECTAL EXAM UNDER ANESTHESIA/ excision of anal polyp;  Surgeon: Ileana Roup, MD;  Location: [facility];  Service: General;  Laterality: N/A;   TONSILLECTOMY     at a very young age, but grew back   TOTAL HIP ARTHROPLASTY Right 05/25/2019   Procedure: RIGHT TOTAL HIP ARTHROPLASTY ANTERIOR APPROACH;  Surgeon: Leandrew Koyanagi, MD;  Location: [place];  Service: Orthopedics;  Laterality: Right;   Social History   Socioeconomic History   Marital status: Married    Spouse name: Marcheta Grammes   Number of children: 3   Years of education: Not on file   Highest education level: Not on file  Occupational History   Occupation: retired  Tobacco Use   Smoking status: Every Day    Packs/day: 0.25    Years: 53.00    Total pack years: 13.25    Types: Cigarettes   Smokeless tobacco: Never   Tobacco comments:    had first cigarettes age  56, habit started at age 44  Vaping Use   Vaping Use: Never used  Substance and Sexual Activity   Alcohol use: Not Currently    Comment: 05/10/1991   Drug use: Not Currently    Comment: prior usage of "speed"  when drinking   Sexual activity: Yes  Other Topics Concern   Not on file  Social History Narrative   Recently moved here  from [facility], [place]   Has total of 4 children 1 adopted   Social Determinants of Health   Financial Resource Strain: Low Risk  (08/09/2020)   Overall Financial Resource Strain (CARDIA)    Difficulty of Paying Living Expenses: Not hard at all  Food Insecurity: No Food Insecurity (08/09/2020)   Hunger Vital Sign    Worried About Running Out of Food in the Last Year: Never true    Ran Out of Food in the Last Year: Never true  Transportation Needs: No Transportation Needs (08/09/2020)   PRAPARE - Hydrologist (Medical): No    Lack of Transportation  (Non-Medical): No  Physical Activity: Inactive (08/09/2020)   Exercise Vital Sign    Days of Exercise per Week: 0 days    Minutes of Exercise per Session: 0 min  Stress: No Stress Concern Present (08/09/2020)   [place]    Feeling of Stress : Not at all  Social Connections: Moderately Isolated (08/09/2020)   Social Connection and Isolation Panel [NHANES]    Frequency of Communication with Friends and Family: More than three times a week    Frequency of Social Gatherings with Friends and Family: Once a week    Attends Religious Services: Never    Marine scientist or Organizations: No    Attends Music therapist: Never    Marital Status: Married   Family History  Problem Relation Age of Onset   Pulmonary fibrosis Mother    Breast cancer Mother    Hyperlipidemia Mother    Hypertension Mother    Other Mother        Scarlet Fever   Heart disease Father    Hyperlipidemia Father    Diabetes Brother    Hypertension Brother    Breast cancer Maternal Aunt    Prostate cancer Paternal Grandfather    Hypothyroidism Daughter    Colon cancer Neg Hx    Esophageal cancer Neg Hx    Ulcerative colitis Neg Hx    Uterine cancer Neg Hx    Allergies  Allergen Reactions   Anesthetics, Amide Nausea And Vomiting   Prior to Admission medications   Medication Sig Start Date End Date Taking? Authorizing Provider  atorvastatin (LIPITOR) 40 MG tablet Take 1 tablet (40 mg total) by mouth daily. 10/19/20  Yes Dagoberto Ligas, PA-C  tamsulosin (FLOMAX) 0.4 MG CAPS capsule TAKE 1 CAPSULE(0.4 MG) BY MOUTH IN THE MORNING 08/09/21  Yes Rudd, Lillette Boxer, MD  Wheat Dextrin-Vit B6-B12-FA ([place]) Take 2 Scoops by mouth daily.   Yes [provider]  aspirin EC 81 MG tablet Take 1 tablet (81 mg total) by mouth 2 (two) times daily. To be taken after surgery to prevent blood clots.  Swallow whole.  08/28/21 08/28/22  Aundra Dubin, PA-C  docusate sodium (COLACE) 100 MG capsule Take 1 capsule (100 mg total) by mouth daily as needed. 08/28/21 08/28/22  Aundra Dubin, PA-C  methocarbamol (ROBAXIN-750) 750 MG tablet Take 1 tablet (750 mg total) by mouth 2 (two) times daily as needed for muscle spasms. To be taken after surgery 08/28/21   Aundra Dubin, PA-C  ondansetron (ZOFRAN) 4 MG tablet Take 1 tablet (4 mg total) by mouth every 8 (eight) hours as needed for nausea or vomiting. 08/28/21   Aundra Dubin, PA-C  oxyCODONE-acetaminophen (PERCOCET) 5-325 MG tablet Take 1-2 tablets by mouth every 6 (  six) hours as needed. To be taken after surgery 08/28/21   Aundra Dubin, PA-C     Positive ROS: All other systems have been reviewed and were otherwise negative with the exception of those mentioned in the HPI and as above.  Physical Exam: General: Alert, no acute distress Cardiovascular: No pedal edema Respiratory: No cyanosis, no use of accessory musculature GI: abdomen soft Skin: No lesions in the area of chief complaint Neurologic: Sensation intact distally Psychiatric: Patient is competent for consent with normal mood and affect Lymphatic: no lymphedema  MUSCULOSKELETAL: exam stable  Assessment: RIGHT KNEE DEGENERATIVE JOINT DISEASE  Plan: Plan for Procedure(s): RIGHT TOTAL KNEE ARTHROPLASTY  The risks benefits and alternatives were discussed with the patient including but not limited to the risks of nonoperative treatment, versus surgical intervention including infection, bleeding, nerve injury,  blood clots, cardiopulmonary complications, morbidity, mortality, among others, and they were willing to proceed.   Eduard Roux, MD 09/04/2021 6:35 AM

## 2021-09-04 NOTE — Op Note (Signed)
Total Knee Arthroplasty Procedure Note  Preoperative diagnosis: Right knee osteoarthritis  Postoperative diagnosis:same  Operative procedure: Right total knee arthroplasty. CPT [phone]  Surgeon: N. Eduard Roux, MD  Assist: Madalyn Rob, PA-C; necessary for the timely completion of procedure and due to complexity of procedure.  Anesthesia: general, regional  Tourniquet time: see anesthesia record  Implants used: Zimmer persona Femur: CR 8 pressfit Tibia: F pressfit Patella: 35 mm cemented Polyethylene: 12 mm, MC  Indication: Gerald Carpenter is a 71 y.o. year old male with a history of knee pain. Having failed conservative management, the patient elected to proceed with a total knee arthroplasty.  We have reviewed the risk and benefits of the surgery and they elected to proceed after voicing understanding.  Procedure:  After informed consent was obtained and understanding of the risk were voiced including but not limited to bleeding, infection, damage to surrounding structures including nerves and vessels, blood clots, leg length inequality and the failure to achieve desired results, the operative extremity was marked with verbal confirmation of the patient in the holding area.   The patient was then brought to the operating room and transported to the operating room table in the supine position.  A tourniquet was applied to the operative extremity around the upper thigh. The operative limb was then prepped and draped in the usual sterile fashion and preoperative antibiotics were administered.  A time out was performed prior to the start of surgery confirming the correct extremity, preoperative antibiotic administration, as well as team members, implants and instruments available for the case. Correct surgical site was also confirmed with preoperative radiographs. The limb was then elevated for exsanguination and the tourniquet was inflated. A midline incision was made and a  standard medial parapatellar approach was performed.  The patella was prepared and sized to a 35 mm.  A cover was placed on the patella for protection from retractors.  We then turned our attention to the femur. Posterior cruciate ligament was sacrificed. Start site was drilled in the femur and the intramedullary distal femoral cutting guide was placed, set at 5 degrees valgus, taking 10 mm of distal resection. The distal cut was made. Osteophytes were then removed.   Next, the proximal tibial cutting guide was placed with appropriate slope, varus/valgus alignment and depth of resection. The proximal tibial cut was made taking 2 mm off the low side. Gap blocks were then used to assess the extension gap and alignment, and appropriate soft tissue releases were performed. Attention was turned back to the femur, which was sized using the sizing guide to a size 8. Appropriate rotation of the femoral component was determined using epicondylar axis, Whiteside's line, and assessing the flexion gap under ligament tension. The appropriate size 4-in-1 cutting block was placed and cuts were made.  Posterior femoral osteophytes and uncapped bone were then removed with the curved osteotome.  Trial components were placed, and stability was checked in full extension, mid-flexion, and deep flexion. Proper tibial rotation was determined and marked.  The patella tracked well without a lateral release. Trial components were then removed and tibial preparation performed.  The tibia was sized for a size F component.  The bone quality of the femur and tibia was excellent so I elected to do pressfit.   The bony surfaces were irrigated with a pulse lavage and then dried. Final components were placed.  Patellar component was cemented into place.  The stability of the construct was re-evaluated throughout a range of motion  and found to be acceptable. The trial liner was removed, the knee was copiously irrigated, and the knee was  re-evaluated for any excess bone debris. The real polyethylene liner, 12 mm thick, was inserted and checked to ensure the locking mechanism had engaged appropriately. The tourniquet was deflated and hemostasis was achieved. The wound was irrigated with normal saline.  One gram of vancomycin powder was placed in the surgical bed.  Topical 0.25% bupivacaine and meloxicam was placed in the joint for postoperative pain.  Capsular closure was performed with a #1 vicryl, subcutaneous fat closed with a 0 vicryl suture, then subcutaneous tissue closed with interrupted 2.0 vicryl suture. The skin was then closed with a 2.0 nylon and dermabond. A sterile dressing was applied.  The patient was awakened in the operating room and taken to recovery in stable condition. All sponge, needle, and instrument counts were correct at the end of the case.  Tawanna Cooler was necessary for opening, closing, retracting, limb positioning and overall facilitation and completion of the surgery.  Position: supine  Complications: none.  Time Out: performed   Drains/Packing: none  Estimated blood loss: minimal  Returned to Recovery Room: in good condition.   Antibiotics: yes   Mechanical VTE (DVT) Prophylaxis: sequential compression devices, TED thigh-high  Chemical VTE (DVT) Prophylaxis: aspirin  Fluid Replacement  Crystalloid: see anesthesia record Blood: none  FFP: none   Specimens Removed: 1 to pathology   Sponge and Instrument Count Correct? yes   PACU: portable radiograph - knee AP and Lateral   Plan/RTC: Return in 2 weeks for wound check.   Weight Bearing/Load Lower Extremity: full   Implant Name Type Inv. Item Serial No. Manufacturer Lot No. LRB No. Used Action  Refobacin Bone Cement    [employer]ET [employer] [id number] Right 1 Implanted  Persona Personalized knee system Cruciate Retaining Right Size 8    ZIMMER [id number] Right 1 Implanted  Persona the personalized knee system 0 degree spiked keel  Right Size F    ZIMMER [id number] Right 1 Implanted  Persona personalized knee system 31m diameter 9.0 mm thickness patella    ZIMMER [id number] 1 Implanted  Persona personalized knee system Vivacit-E Highly crosslinked polyethylene right 164m    [id number] 1 Implanted    N. MiEduard RouxMD [facility]0:47 AM

## 2021-09-05 ENCOUNTER — Encounter (HOSPITAL_COMMUNITY): Payer: Self-pay | Admitting: Orthopaedic Surgery

## 2021-09-05 ENCOUNTER — Telehealth: Payer: Self-pay | Admitting: *Deleted

## 2021-09-05 DIAGNOSIS — M1711 Unilateral primary osteoarthritis, right knee: Secondary | ICD-10-CM | POA: Diagnosis not present

## 2021-09-05 LAB — CBC
HCT: 42.1 % (ref 39.0–52.0)
Hemoglobin: 14.3 g/dL (ref 13.0–17.0)
MCH: 31.8 pg (ref 26.0–34.0)
MCHC: 34 g/dL (ref 30.0–36.0)
MCV: 93.8 fL (ref 80.0–100.0)
Platelets: 159 10*3/uL (ref 150–400)
RBC: 4.49 MIL/uL (ref 4.22–5.81)
RDW: 14.6 % (ref 11.5–15.5)
WBC: 23.7 10*3/uL — ABNORMAL HIGH (ref 4.0–10.5)
nRBC: 0 % (ref 0.0–0.2)

## 2021-09-05 NOTE — Care Plan (Signed)
[facility] RNCM spoke with patient regarding his Right total knee arthroplasty. He is an Ortho bundle patient through [facility] and is agreeable to case management. He lives with his spouse, who will be assisting after surgery. He has all needed DME- [employer] delivered home CPM and has RW, 3in1/BSC from previous Ortho surgery. Reviewed all post op care instructions. HHPT has been set up with [facility] after choice provided. Will continue to follow for needs.

## 2021-09-05 NOTE — Progress Notes (Signed)
Physical Therapy Treatment & Discharge  Patient Details Name: Gerald Carpenter MRN: [id number] DOB: 1950-07-01 Today's Date: 09/05/2021   History of Present Illness 71 y/o male s/p R TKA. PMH includes PVD, R THA, and back surgery.    PT Comments    Pt demonstrated increased functional independence this session. ModI for transfers and ambulation of 480f with RW. States he only has one "very small" step to get inside his home and his son will be there to assist. Educated pt on HEP, proper positioning of knee, and progression of mobility upon discharge. Pt with no further questions or concerns at this time, PT will sign off.    Recommendations for follow up therapy are one component of a multi-disciplinary discharge planning process, led by the attending physician.  Recommendations may be updated based on patient status, additional functional criteria and insurance authorization.  Follow Up Recommendations  Follow physician's recommendations for discharge plan and follow up therapies     Assistance Recommended at Discharge PRN  Patient can return home with the following Assistance with cooking/housework;Assist for transportation   Equipment Recommendations  None recommended by PT    Recommendations for Other Services       Precautions / Restrictions Precautions Precautions: Knee Precaution Booklet Issued: Yes (comment) Precaution Comments: reviewed not resting in knee flexed position Restrictions Weight Bearing Restrictions: Yes RLE Weight Bearing: Weight bearing as tolerated     Mobility  Bed Mobility Overal bed mobility: Modified Independent                  Transfers Overall transfer level: Modified independent Equipment used: Rolling walker (2 wheels)                    Ambulation/Gait Ambulation/Gait assistance: Modified independent (Device/Increase time) Gait Distance (Feet): 400 Feet Assistive device: Rolling walker (2 wheels) Gait  Pattern/deviations: Step-through pattern, Antalgic, Knee flexed in stance - right Gait velocity: functional Gait velocity interpretation: 1.31 - 2.62 ft/sec, indicative of limited community ambulator   General Gait Details: antalgic gait with good sequencing and balance demonstrated   Stairs             Wheelchair Mobility    Modified Rankin (Stroke Patients Only)       Balance Overall balance assessment: Modified Independent                                          Cognition Arousal/Alertness: Awake/alert Behavior During Therapy: WFL for tasks assessed/performed Overall Cognitive Status: Within Functional Limits for tasks assessed                                          Exercises Total Joint Exercises Quad Sets: AROM, Right, 5 reps, Supine Heel Slides: AROM, Right, 5 reps, Seated Straight Leg Raises: AROM, Right, 5 reps, Supine Goniometric ROM: 6-90 degrees    General Comments General comments (skin integrity, edema, etc.): dressing intact not drainage      Pertinent Vitals/Pain Pain Assessment Pain Assessment: 0-10 Pain Score: 2  Pain Location: R knee Pain Descriptors / Indicators: Grimacing, Guarding Pain Intervention(s): Limited activity within patient's tolerance, Monitored during session    Home Living Family/patient expects to be discharged to:: Private residence Living Arrangements: Spouse/significant other Available Help at Discharge: Family;Available 24  hours/day Type of Home: House Home Access: Stairs to enter Entrance Stairs-Rails: None Entrance Stairs-Number of Steps: 2   Home Layout: Two level;Able to live on main level with bedroom/bathroom Home Equipment: BSC/3in1;Rolling Walker (2 wheels);Cane - single point Additional Comments: daughter present from [place] to help with recovery    Prior Function            PT Goals (current goals can now be found in the care plan section) Acute Rehab PT  Goals Patient Stated Goal: to go home PT Goal Formulation: All assessment and education complete, DC therapy Progress towards PT goals: Goals met/education completed, patient discharged from PT    Frequency    7X/week      PT Plan Current plan remains appropriate    Co-evaluation              AM-PAC PT "6 Clicks" Mobility   Outcome Measure  Help needed turning from your back to your side while in a flat bed without using bedrails?: None Help needed moving from lying on your back to sitting on the side of a flat bed without using bedrails?: None Help needed moving to and from a bed to a chair (including a wheelchair)?: None Help needed standing up from a chair using your arms (e.g., wheelchair or bedside chair)?: None Help needed to walk in hospital room?: None Help needed climbing 3-5 steps with a railing? : A Little 6 Click Score: 23    End of Session   Activity Tolerance: Patient tolerated treatment well Patient left: in bed;with call bell/phone within reach Nurse Communication: Mobility status PT Visit Diagnosis: Other abnormalities of gait and mobility (R26.89);Difficulty in walking, not elsewhere classified (R26.2);Pain Pain - Right/Left: Right Pain - part of body: Knee     Time: 5277-8242 PT Time Calculation (min) (ACUTE ONLY): 18 min  Charges:  $Therapeutic Activity: 8-22 mins                     Mackie Pai, SPT Acute Rehabilitation Services  Office: [phone]    Mackie Pai 09/05/2021, 12:07 PM

## 2021-09-05 NOTE — Anesthesia Postprocedure Evaluation (Signed)
Anesthesia Post Note  Patient: Gerald Carpenter  Procedure(s) Performed: RIGHT TOTAL KNEE ARTHROPLASTY (Right: Knee)     Patient location during evaluation: PACU Anesthesia Type: Regional and General Level of consciousness: awake and alert Pain management: pain level controlled Vital Signs Assessment: post-procedure vital signs reviewed and stable Respiratory status: spontaneous breathing, nonlabored ventilation, respiratory function stable and patient connected to nasal cannula oxygen Cardiovascular status: blood pressure returned to baseline and stable Postop Assessment: no apparent nausea or vomiting Anesthetic complications: no   No notable events documented.  Last Vitals:  Vitals:   09/04/21 2022 09/05/21 0417  BP: (!) 115/56 105/62  Pulse: 66 66  Resp: 18 20  Temp: 36.8 C 36.6 C  SpO2: 93% 92%    Last Pain:  Vitals:   09/05/21 0417  TempSrc: Oral  PainSc:    Pain Goal:                   Kayleen Alig S

## 2021-09-05 NOTE — Evaluation (Signed)
Occupational Therapy Evaluation Patient Details Name: Gerald Carpenter MRN: [id number] DOB: 05/24/1950 Today's Date: 09/05/2021   History of Present Illness 71 y/o male s/p R TKA. PMH includes PVD, R THA, and back surgery.   Clinical Impression   Patient evaluated by Occupational Therapy with no further acute OT needs identified. All education has been completed and the patient has no further questions. See below for any follow-up Occupational Therapy or equipment needs. OT to sign off. Thank you for referral.        Recommendations for follow up therapy are one component of a multi-disciplinary discharge planning process, led by the attending physician.  Recommendations may be updated based on patient status, additional functional criteria and insurance authorization.   Follow Up Recommendations  Follow physician's recommendations for discharge plan and follow up therapies    Assistance Recommended at Discharge PRN  Patient can return home with the following Assist for transportation    Functional Status Assessment  Patient has had a recent decline in their functional status and demonstrates the ability to make significant improvements in function in a reasonable and predictable amount of time.  Equipment Recommendations  None recommended by OT    Recommendations for Other Services       Precautions / Restrictions Precautions Precautions: Knee Precaution Booklet Issued: No Precaution Comments: reviewed knee extension Restrictions Weight Bearing Restrictions: Yes RLE Weight Bearing: Weight bearing as tolerated      Mobility Bed Mobility Overal bed mobility: Modified Independent                  Transfers Overall transfer level: Needs assistance Equipment used: Rolling walker (2 wheels) Transfers: Sit to/from Stand, Bed to chair/wheelchair/BSC Sit to Stand: Modified independent (Device/Increase time)           General transfer comment: v/c  for hand  placement and sequence      Balance Overall balance assessment: Needs assistance Sitting-balance support: No upper extremity supported, Feet supported Sitting balance-Leahy Scale: Fair     Standing balance support: Bilateral upper extremity supported Standing balance-Leahy Scale: Fair Standing balance comment: Reliant on BUE support                           ADL either performed or assessed with clinical judgement   ADL Overall ADL's : Needs assistance/impaired Eating/Feeding: Independent   Grooming: Wash/dry hands           Upper Body Dressing : Independent   Lower Body Dressing: Independent   Toilet Transfer: Independent   Toileting- Clothing Manipulation and Hygiene: Independent       Functional mobility during ADLs: Modified independent;Rolling walker (2 wheels)       Vision         Perception     Praxis      Pertinent Vitals/Pain Pain Assessment Pain Assessment: Faces Pain Score: 2  Pain Location: R knee Pain Descriptors / Indicators: Grimacing, Guarding Pain Intervention(s): Monitored during session, Premedicated before session, Repositioned     Hand Dominance Right   Extremity/Trunk Assessment Upper Extremity Assessment Upper Extremity Assessment: Overall WFL for tasks assessed   Lower Extremity Assessment Lower Extremity Assessment: Defer to PT evaluation RLE Deficits / Details: observed at EOB knee flexion ~80 degrees   Cervical / Trunk Assessment Cervical / Trunk Assessment: Normal   Communication Communication Communication: No difficulties   Cognition Arousal/Alertness: Awake/alert Behavior During Therapy: WFL for tasks assessed/performed Overall Cognitive Status: Within Functional Limits for  tasks assessed                                       General Comments  dressing intact not drainage    Exercises     Shoulder Instructions      Home Living Family/patient expects to be discharged to::  Private residence Living Arrangements: Spouse/significant other Available Help at Discharge: Family;Available 24 hours/day Type of Home: House Home Access: Stairs to enter [employer] of Steps: 2 Entrance Stairs-Rails: None Home Layout: Two level;Able to live on main level with bedroom/bathroom     Bathroom Shower/Tub: Walk-in shower;Tub/shower unit   Bathroom Toilet: Standard     Home Equipment: Public relations account executive (2 wheels);Cane - single point   Additional Comments: daughter present from [place] to help with recovery      Prior Functioning/Environment Prior Level of Function : Independent/Modified Independent                        OT Problem List:        OT Treatment/Interventions:      OT Goals(Current goals can be found in the care plan section) Acute Rehab OT Goals Patient Stated Goal: to go home  OT Frequency:      Co-evaluation              AM-PAC OT "6 Clicks" Daily Activity     Outcome Measure Help from another person eating meals?: None Help from another person taking care of personal grooming?: None Help from another person toileting, which includes using toliet, bedpan, or urinal?: None Help from another person bathing (including washing, rinsing, drying)?: None Help from another person to put on and taking off regular upper body clothing?: None Help from another person to put on and taking off regular lower body clothing?: None 6 Click Score: 24   End of Session Equipment Utilized During Treatment: Rolling walker (2 wheels) Nurse Communication: Mobility status;Precautions  Activity Tolerance: Patient tolerated treatment well Patient left: in bed;with call bell/phone within reach;Other (comment) (RN giving d/c orders)  OT Visit Diagnosis: Unsteadiness on feet (R26.81)                Time: 0100-7121 OT Time Calculation (min): 13 min Charges:  OT General Charges $OT Visit: 1 Visit OT Evaluation $OT Eval Low Complexity: 1  Low   Brynn, OTR/L  Acute Rehabilitation Services Office: [phone] .   Jeri Modena 09/05/2021, 9:45 AM

## 2021-09-05 NOTE — Telephone Encounter (Signed)
Ortho bundle pre-op completed on 09/04/21.

## 2021-09-05 NOTE — Discharge Summary (Signed)
Patient ID: Gerald Carpenter MRN: [id number] DOB/AGE: Jan 01, 1951 71 y.o.  Admit date: 09/04/2021 Discharge date: 09/05/2021  Admission Diagnoses:  Principal Problem:   Primary osteoarthritis of right knee Active Problems:   Status post total right knee replacement   Discharge Diagnoses:  Same  Past Medical History:  Diagnosis Date   Alcoholism ([place])    Arthritis    Cataract    Colon polyps    Complication of anesthesia    HLD (hyperlipidemia)    Nephritis    when pt. was 7 yrs. ols   Peripheral vascular disease ([facility])    PONV (postoperative nausea and vomiting)    "Only when I have aortagram on 06/23/20".   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   Substance abuse ([place])     Surgeries: Procedure(s): RIGHT TOTAL KNEE ARTHROPLASTY on 09/04/2021   Consultants:   Discharged Condition: Improved  Hospital Course: Gerald Carpenter is an 71 y.o. male who was admitted 09/04/2021 for operative treatment ofPrimary osteoarthritis of right knee. Patient has severe unremitting pain that affects sleep, daily activities, and work/hobbies. After pre-op clearance the patient was taken to the operating room on 09/04/2021 and underwent  Procedure(s): RIGHT TOTAL KNEE ARTHROPLASTY.    Patient was given perioperative antibiotics:  Anti-infectives (From admission, onward)    Start     Dose/Rate Route Frequency Ordered Stop   09/04/21 1500  ceFAZolin (ANCEF) IVPB 2g/100 mL premix        2 g 200 mL/hr over 30 Minutes Intravenous Every 6 hours 09/04/21 1316 09/04/21 2055   09/04/21 1003  vancomycin (VANCOCIN) powder  Status:  Discontinued          As needed 09/04/21 1003 09/04/21 1115   09/04/21 0645  ceFAZolin (ANCEF) IVPB 2g/100 mL premix        2 g 200 mL/hr over 30 Minutes Intravenous On call to O.R. 09/04/21 0636 09/04/21 0930        Patient was given sequential compression devices, early ambulation, and chemoprophylaxis to prevent DVT.  Patient benefited maximally from hospital  stay and there were no complications.    Recent vital signs: Patient Vitals for the past 24 hrs:  BP Temp Temp src Pulse Resp SpO2  09/05/21 0724 124/67 98 F (36.7 C) -- (!) 55 15 94 %  09/05/21 0417 105/62 97.9 F (36.6 C) Oral 66 20 92 %  09/04/21 2022 (!) 115/56 98.3 F (36.8 C) Oral 66 18 93 %  09/04/21 1457 125/70 (!) 97.5 F (36.4 C) Oral 77 16 95 %  09/04/21 1316 132/73 (!) 97.5 F (36.4 C) Oral 69 17 94 %  09/04/21 1300 140/73 97.8 F (36.6 C) -- 70 18 95 %  09/04/21 1230 134/79 -- -- 66 14 94 %  09/04/21 1215 138/75 -- -- 72 16 97 %  09/04/21 1200 (!) 142/77 -- -- 69 16 95 %  09/04/21 1145 134/77 -- -- 71 15 96 %  09/04/21 1130 124/67 -- -- 68 15 94 %  09/04/21 1117 121/67 (!) 97.2 F (36.2 C) -- 71 14 94 %  09/04/21 0905 113/72 -- -- 64 16 96 %  09/04/21 0900 115/69 -- -- 62 18 95 %  09/04/21 0855 119/73 -- -- 60 17 96 %  09/04/21 0850 113/71 -- -- 67 17 93 %  09/04/21 0845 119/60 -- -- 61 17 93 %  09/04/21 0840 124/70 -- -- 61 11 95 %  09/04/21 0835 126/72 -- -- 66 15 95 %  09/04/21 0830  129/84 -- -- 66 17 95 %  09/04/21 0825 (!) 132/100 -- -- 67 (!) 21 96 %     Recent laboratory studies:  Recent Labs    09/05/21 0242  WBC 23.7*  HGB 14.3  HCT 42.1  PLT 159     Discharge Medications:   Allergies as of 09/05/2021       Reactions   Anesthetics, Amide Nausea And Vomiting        Medication List     TAKE these medications    aspirin EC 81 MG tablet Take 1 tablet (81 mg total) by mouth 2 (two) times daily. To be taken after surgery to prevent blood clots.  Swallow whole.   atorvastatin 40 MG tablet Commonly known as: LIPITOR Take 1 tablet (40 mg total) by mouth daily.   BENEFIBER PLUS HEART HEALTH PO Take 2 Scoops by mouth daily.   docusate sodium 100 MG capsule Commonly known as: Colace Take 1 capsule (100 mg total) by mouth daily as needed.   methocarbamol 750 MG tablet Commonly known as: Robaxin-750 Take 1 tablet (750 mg total) by  mouth 2 (two) times daily as needed for muscle spasms. To be taken after surgery   ondansetron 4 MG tablet Commonly known as: Zofran Take 1 tablet (4 mg total) by mouth every 8 (eight) hours as needed for nausea or vomiting.   oxyCODONE-acetaminophen 5-325 MG tablet Commonly known as: Percocet Take 1-2 tablets by mouth every 6 (six) hours as needed. To be taken after surgery   tamsulosin 0.4 MG Caps capsule Commonly known as: FLOMAX TAKE 1 CAPSULE(0.4 MG) BY MOUTH IN THE MORNING               Durable Medical Equipment  (From admission, onward)           Start     Ordered   09/04/21 1316  DME Walker rolling  Once       Question Answer Comment  Walker: With 5 Inch Wheels   Patient needs a walker to treat with the following condition Status post left partial knee replacement      09/04/21 1316   09/04/21 1316  DME 3 n 1  Once        09/04/21 1316   09/04/21 1316  DME Bedside commode  Once       Question:  Patient needs a bedside commode to treat with the following condition  Answer:  Status post left partial knee replacement   09/04/21 1316            Diagnostic Studies: DG Knee Right Port  Result Date: 09/04/2021 CLINICAL DATA:  Pain EXAM: PORTABLE RIGHT KNEE - 2 VIEW COMPARISON:  Knee radiograph dated August 01, 2021 FINDINGS: Interval postsurgical changes from right total knee arthroplasty. Arthroplasty components appear in their expected alignment. No periprosthetic fracture is identified. Expected postoperative changes within the overlying soft tissues. IMPRESSION: Postsurgical changes from right total knee arthroplasty. Electronically Signed   By: Yetta Glassman M.D.   On: 09/04/2021 11:40    Disposition: Discharge disposition: 01-Home or Self Care          Follow-up Information     Leandrew Koyanagi, MD. Schedule an appointment as soon as possible for a visit in 2 week(s).   Specialty: Orthopedic Surgery Contact information: [address] [place] [address] [phone]                  Signed: Aundra Dubin 09/05/2021,  7:37 AM

## 2021-09-05 NOTE — Progress Notes (Addendum)
Subjective: 1 Day Post-Op Procedure(s) (LRB): RIGHT TOTAL KNEE ARTHROPLASTY (Right) Patient reports pain as mild.  Slight tourniquet pain.  No other complaints  Objective: Vital signs in last 24 hours: Temp:  [97.2 F (36.2 C)-98.3 F (36.8 C)] 98 F (36.7 C) (07/18 0724) Pulse Rate:  [55-77] 55 (07/18 0724) Resp:  [11-21] 15 (07/18 0724) BP: (105-142)/(56-100) 124/67 (07/18 0724) SpO2:  [92 %-97 %] 94 % (07/18 0724)  Intake/Output from previous day: 07/17 0701 - 07/18 0700 In: 1660 [P.O.:360; I.V.:1000; IV Piggyback:300] Out: 600 [Urine:550; Blood:50] Intake/Output this shift: No intake/output data recorded.  Recent Labs    09/05/21 0242  HGB 14.3   Recent Labs    09/05/21 0242  WBC 23.7*  RBC 4.49  HCT 42.1  PLT 159   No results for input(s): "NA", "K", "CL", "CO2", "BUN", "CREATININE", "GLUCOSE", "CALCIUM" in the last 72 hours. No results for input(s): "LABPT", "INR" in the last 72 hours.  Neurologically intact Neurovascular intact Sensation intact distally Intact pulses distally Dorsiflexion/Plantar flexion intact Incision: dressing C/D/I No cellulitis present Compartment soft   Assessment/Plan: 1 Day Post-Op Procedure(s) (LRB): RIGHT TOTAL KNEE ARTHROPLASTY (Right) Advance diet Up with therapy D/C IV fluids Discharge home with home health after second PT session WBAT RLE    Anticipated LOS equal to or greater than 2 midnights due to - Age 12 and older with one or more of the following:  - Obesity  - Expected need for hospital services (PT, OT, Nursing) required for safe  discharge  - Anticipated need for postoperative skilled nursing care or inpatient rehab  - Active co-morbidities: None OR   - Unanticipated findings during/Post Surgery: None  - Patient is a high risk of re-admission due to: None   Gerald Carpenter 09/05/2021, 7:35 AM

## 2021-09-05 NOTE — Plan of Care (Signed)
  Problem: Education: Goal: Knowledge of General Education information will improve Description: Including pain rating scale, medication(s)/side effects and non-pharmacologic comfort measures Outcome: Completed/Met   Problem: Health Behavior/Discharge Planning: Goal: Ability to manage health-related needs will improve Outcome: Completed/Met   Problem: Clinical Measurements: Goal: Ability to maintain clinical measurements within normal limits will improve Outcome: Completed/Met Goal: Will remain free from infection Outcome: Completed/Met Goal: Diagnostic test results will improve Outcome: Completed/Met Goal: Respiratory complications will improve Outcome: Completed/Met Goal: Cardiovascular complication will be avoided Outcome: Completed/Met   Problem: Activity: Goal: Risk for activity intolerance will decrease Outcome: Completed/Met   Problem: Activity: Goal: Risk for activity intolerance will decrease Outcome: Completed/Met   Problem: Nutrition: Goal: Adequate nutrition will be maintained Outcome: Completed/Met   Problem: Coping: Goal: Level of anxiety will decrease Outcome: Completed/Met   Problem: Elimination: Goal: Will not experience complications related to bowel motility Outcome: Completed/Met Goal: Will not experience complications related to urinary retention Outcome: Completed/Met   Problem: Pain Managment: Goal: General experience of comfort will improve Outcome: Completed/Met   Problem: Safety: Goal: Ability to remain free from injury will improve Outcome: Completed/Met   Problem: Skin Integrity: Goal: Risk for impaired skin integrity will decrease Outcome: Completed/Met

## 2021-09-06 ENCOUNTER — Telehealth: Payer: Self-pay | Admitting: *Deleted

## 2021-09-06 NOTE — Telephone Encounter (Signed)
Ortho bundle D/C call completed. 

## 2021-09-07 ENCOUNTER — Encounter
Payer: Medicare Other | Attending: Physical Medicine and Rehabilitation | Admitting: Physical Medicine and Rehabilitation

## 2021-09-07 ENCOUNTER — Telehealth: Payer: Self-pay | Admitting: *Deleted

## 2021-09-07 NOTE — Telephone Encounter (Signed)
Patient called and said he'd need refill before the weekend, so he doesn't run out over the weekend. We discussed dosage and timing. He has plenty today and tomorrow- anticipates running out Saturday and wanted to be proactive. Thanks.

## 2021-09-08 ENCOUNTER — Other Ambulatory Visit: Payer: Self-pay | Admitting: Physician Assistant

## 2021-09-08 MED ORDER — OXYCODONE-ACETAMINOPHEN 5-325 MG PO TABS: 1.0000 | ORAL_TABLET | Freq: Four times a day (QID) | ORAL | 0 refills | Status: DC | PRN

## 2021-09-08 NOTE — Telephone Encounter (Signed)
Spoke to patient's daughter and relayed that medication is now at pharmacy to be filled when he needs.Also discussed compression stockings and where to pick up an extra pair.

## 2021-09-08 NOTE — Telephone Encounter (Signed)
Sent in

## 2021-09-12 ENCOUNTER — Telehealth: Payer: Self-pay | Admitting: *Deleted

## 2021-09-12 ENCOUNTER — Other Ambulatory Visit: Payer: Self-pay | Admitting: Physician Assistant

## 2021-09-12 MED ORDER — HYDROCODONE-ACETAMINOPHEN 5-325 MG PO TABS: 1.0000 | ORAL_TABLET | Freq: Three times a day (TID) | ORAL | 0 refills | Status: DC | PRN

## 2021-09-12 NOTE — Telephone Encounter (Signed)
I sent in norco to try.  I would have him take a benadryl or a tylenol pm at night, but avoid advil bc of the aspirin.  I would also pick up prevacid, nexium or prilosec otc.  Is he having any issues with constipation?

## 2021-09-12 NOTE — Telephone Encounter (Signed)
Patient's daughter called as well as HHPT stating patient is very hesitant to take Oxycodone due to some past issues with addiction. He is itching if he takes 2, but really doesn't like the way it makes him feel. Could we step him down you think to something different to see if this helps with pain and keeps him from feeling so badly after taking? He's having difficulty sleeping as well, so he has taken Aleve PM. Are you ok with this vs. Tylenol PM? Also having some GI issues (frequent burping, indigestion, etc.- no appetite). Could this be from Aspirin twice daily? They asked if there was something like Omeprazole that could be prescribed for this.Or any other recommendations. Thank you.

## 2021-09-12 NOTE — Telephone Encounter (Signed)
Ok gotcha.  I think the ppi will help.  If it persists with that, let us know as we may need to stop the asa

## 2021-09-15 ENCOUNTER — Other Ambulatory Visit: Payer: Self-pay | Admitting: *Deleted

## 2021-09-15 DIAGNOSIS — Z96651 Presence of right artificial knee joint: Secondary | ICD-10-CM

## 2021-09-17 ENCOUNTER — Encounter: Payer: Self-pay | Admitting: Family Medicine

## 2021-09-19 ENCOUNTER — Other Ambulatory Visit: Payer: Self-pay

## 2021-09-19 ENCOUNTER — Ambulatory Visit (HOSPITAL_COMMUNITY)
Admission: RE | Admit: 2021-09-19 | Discharge: 2021-09-19 | Disposition: A | Discharge status: Home / Self Care | Payer: Medicare Other | Source: Ambulatory Visit | Attending: Orthopaedic Surgery | Admitting: Orthopaedic Surgery

## 2021-09-19 ENCOUNTER — Ambulatory Visit (INDEPENDENT_AMBULATORY_CARE_PROVIDER_SITE_OTHER): Payer: Medicare Other | Admitting: Orthopaedic Surgery

## 2021-09-19 DIAGNOSIS — Z96651 Presence of right artificial knee joint: Secondary | ICD-10-CM

## 2021-09-19 MED ORDER — TIZANIDINE HCL 4 MG PO TABS: 4.0000 mg | ORAL_TABLET | Freq: Four times a day (QID) | ORAL | 2 refills | Status: DC | PRN

## 2021-09-19 MED ORDER — HYDROCODONE-ACETAMINOPHEN 5-325 MG PO TABS: 1.0000 | ORAL_TABLET | Freq: Every day | ORAL | 0 refills | Status: DC | PRN

## 2021-09-19 NOTE — Progress Notes (Signed)
Post-Op Visit Note   Patient: Gerald Carpenter           Date of Birth: February 04, 1951           MRN: [id number] Visit Date: 09/19/2021 PCP: Haydee Salter, MD   Assessment & Plan:  Chief Complaint:  Chief Complaint  Patient presents with   Right Knee - Routine Post Op   Visit Diagnoses:  1. Status post total right knee replacement     Plan: [name] is 2 weeks status post right total knee on 04/07/2021.  Doing well overall.  Reports more pain at night.  Progressing with home health PT very well.  Does have a significant mount of calf tenderness and swelling is worse in the morning.  Denies any chest pain or shortness of breath.  Examination of the right knee shows a healed surgical incision.  No drainage or signs of infection.  Expected postoperative swelling around the knee.  Calf is tender and swollen to touch.  Sutures removed Steri-Strips applied.  We will order an urgent Doppler to rule out DVT.  Will contact the patient with the results.  Hydrocodone and tizanidine refilled.  Recheck in 4 weeks with two-view x-rays of the right knee.  Follow-Up Instructions: Return in about 4 weeks (around 10/17/2021).   Orders:  No orders of the defined types were placed in this encounter.  Meds ordered this encounter  Medications   tiZANidine (ZANAFLEX) 4 MG tablet    Sig: Take 1 tablet (4 mg total) by mouth every 6 (six) hours as needed for muscle spasms.    Dispense:  30 tablet    Refill:  2   HYDROcodone-acetaminophen (NORCO) 5-325 MG tablet    Sig: Take 1-2 tablets by mouth daily as needed.    Dispense:  40 tablet    Refill:  0    Imaging: No results found.  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 was 7 yrs. ols   Peripheral vascular disease ([facility])    PONV (postoperative nausea and vomiting)    "Only when I have aortagram on 06/23/20".   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   Substance abuse ([place])     Family History  Problem Relation Age of Onset   Pulmonary fibrosis Mother    Breast cancer Mother    Hyperlipidemia Mother    Hypertension Mother    Other Mother        Scarlet Fever   Heart disease Father    Hyperlipidemia Father    Diabetes Brother    Hypertension Brother    Breast cancer Maternal Aunt  Prostate cancer Paternal Grandfather    Hypothyroidism Daughter    Colon cancer Neg Hx    Esophageal cancer Neg Hx    Ulcerative colitis Neg Hx    Uterine cancer Neg Hx     Past Surgical History:  Procedure Laterality Date   ABDOMINAL AORTOGRAM W/LOWER EXTREMITY N/A 06/23/2020   Procedure: ABDOMINAL AORTOGRAM W/LOWER EXTREMITY;  Surgeon: [name], MD;  Location: [facility];  Service: Cardiovascular;  Laterality: N/A;   ANTERIOR LAT LUMBAR FUSION Right 02/05/2019   Procedure: ATTEMPTED ANTERIOR LATERAL INTERBODY FUSION;  Surgeon: Consuella Lose, MD;  Location: [facility];  Service: Neurosurgery;  Laterality: Right;  RIGHT LATERAL TRANSPSOAS DISCECTOMY AND FUSION WITH ROBOTIC ASSISTED PEDICLE SCREW STABILIZATION, LUMBAR 4- LUMBAR 5   CATARACT EXTRACTION W/ INTRAOCULAR LENS  IMPLANT, BILATERAL  Bilateral 2013   lens implants for cataracts after Lasik   COLONOSCOPY     ENDOVEIN HARVEST OF GREATER SAPHENOUS VEIN Left 06/29/2020   Procedure: HARVEST OF LEFT GREATER SAPHENOUS VEIN;  Surgeon: [name], MD;  Location: [facility];  Service: Vascular;  Laterality: Left;   FEMORAL-POPLITEAL BYPASS GRAFT Left 06/29/2020   Procedure: BYPASS GRAFT LEFT COMMON FEMORAL TO ABOVE KNEE POPLITEAL ARTERY;  Surgeon: [name], MD;  Location: [facility];  Service: Vascular;  Laterality: Left;   HEMORRHOID SURGERY N/A 04/06/2021   Procedure: HEMORRHOIDECTOMY 2 COLUMN;  Surgeon: Ileana Roup, MD;  Location: [facility];  Service: General;  Laterality: N/A;   KNEE ARTHROSCOPY Right [place] N/A 04/06/2021   Procedure: ANORECTAL EXAM UNDER ANESTHESIA/ excision of anal polyp;  Surgeon: Ileana Roup, MD;  Location: [facility];  Service: General;  Laterality: N/A;   TONSILLECTOMY     at a very young age, but grew back   TOTAL HIP ARTHROPLASTY Right 05/25/2019   Procedure: RIGHT TOTAL HIP ARTHROPLASTY ANTERIOR APPROACH;  Surgeon: Leandrew Koyanagi, MD;  Location: [facility];  Service: Orthopedics;  Laterality: Right;   TOTAL KNEE ARTHROPLASTY Right 09/04/2021   Procedure: RIGHT TOTAL KNEE ARTHROPLASTY;  Surgeon: Leandrew Koyanagi, MD;  Location: [facility];  Service: Orthopedics;  Laterality: Right;   Social History   Occupational History   Occupation: retired  Tobacco Use   Smoking status: Every Day    Packs/day: 0.25    Years: 53.00    Total pack years: 13.25    Types: Cigarettes   Smokeless tobacco: Never   Tobacco comments:    had first cigarettes age  42, habit started at age 52  Vaping Use   Vaping Use: Never used  Substance and Sexual Activity   Alcohol use: Not Currently    Comment: 05/10/1991   Drug use: Not Currently    Comment: prior usage of "speed"  when drinking   Sexual activity: Yes

## 2021-09-19 NOTE — Progress Notes (Signed)
Right LE venous duplex study completed. Please see CV Proc for preliminary results.  Geoffrey Mankin BS, RVT 09/19/2021 1:34 PM

## 2021-09-20 ENCOUNTER — Telehealth: Payer: Self-pay | Admitting: *Deleted

## 2021-09-20 ENCOUNTER — Encounter: Payer: Self-pay | Admitting: Orthopaedic Surgery

## 2021-09-20 NOTE — Telephone Encounter (Signed)
Do we need to do anything about the mass in the calf?

## 2021-09-20 NOTE — Telephone Encounter (Signed)
ok 

## 2021-09-20 NOTE — Telephone Encounter (Signed)
Ortho bundle 14 day in office meeting completed. °

## 2021-09-21 ENCOUNTER — Encounter: Payer: Self-pay | Admitting: Physical Therapy

## 2021-09-21 ENCOUNTER — Ambulatory Visit: Payer: Medicare Other | Attending: Orthopaedic Surgery | Admitting: Physical Therapy

## 2021-09-21 DIAGNOSIS — M25561 Pain in right knee: Secondary | ICD-10-CM | POA: Diagnosis present

## 2021-09-21 DIAGNOSIS — Z96651 Presence of right artificial knee joint: Secondary | ICD-10-CM | POA: Insufficient documentation

## 2021-09-21 DIAGNOSIS — M25661 Stiffness of right knee, not elsewhere classified: Secondary | ICD-10-CM | POA: Diagnosis present

## 2021-09-21 DIAGNOSIS — R262 Difficulty in walking, not elsewhere classified: Secondary | ICD-10-CM | POA: Diagnosis present

## 2021-09-21 DIAGNOSIS — M6281 Muscle weakness (generalized): Secondary | ICD-10-CM | POA: Insufficient documentation

## 2021-09-21 DIAGNOSIS — R600 Localized edema: Secondary | ICD-10-CM | POA: Insufficient documentation

## 2021-09-21 NOTE — Therapy (Signed)
OUTPATIENT PHYSICAL THERAPY LOWER EXTREMITY EVALUATION   Patient Name: Gerald Carpenter MRN: [id number] DOB:March 13, 1950, 71 y.o., male Today's Date: 09/21/2021   PT End of Session - 09/21/21 0832     Visit Number 1    Date for PT Re-Evaluation 12/22/21    Authorization Type Medicare    PT Start Time 0828    PT Stop Time 0930    PT Time Calculation (min) 62 min    Activity Tolerance Patient tolerated treatment well    Behavior During Therapy [facility] for tasks assessed/performed             Past Medical History:  Diagnosis Date   Alcoholism ([place])    Arthritis    Cataract    Colon polyps    Complication of anesthesia    HLD (hyperlipidemia)    Nephritis    when pt. was 7 yrs. ols   Peripheral vascular disease (HCC)    PONV (postoperative nausea and vomiting)    "Only when I have aortagram on 06/23/20".   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   Substance abuse [facility])    Past Surgical History:  Procedure Laterality Date   ABDOMINAL AORTOGRAM W/LOWER EXTREMITY N/A 06/23/2020   Procedure: ABDOMINAL AORTOGRAM W/LOWER EXTREMITY;  Surgeon: [name], MD;  Location: [place] [facility];  Service: Cardiovascular;  Laterality: N/A;   ANTERIOR LAT LUMBAR FUSION Right 02/05/2019   Procedure: ATTEMPTED ANTERIOR LATERAL INTERBODY FUSION;  Surgeon: Consuella Lose, MD;  Location: [place];  Service: Neurosurgery;  Laterality: Right;  RIGHT LATERAL TRANSPSOAS DISCECTOMY AND FUSION WITH ROBOTIC ASSISTED PEDICLE SCREW STABILIZATION, LUMBAR 4- LUMBAR 5   CATARACT EXTRACTION W/ INTRAOCULAR LENS  IMPLANT, BILATERAL Bilateral 2013   lens implants for cataracts after Lasik   COLONOSCOPY     ENDOVEIN HARVEST OF GREATER SAPHENOUS VEIN Left 06/29/2020   Procedure: HARVEST OF LEFT GREATER SAPHENOUS VEIN;  Surgeon: [name], MD;  Location: [place];  Service: Vascular;  Laterality: Left;   FEMORAL-POPLITEAL BYPASS GRAFT Left 06/29/2020   Procedure: BYPASS GRAFT LEFT COMMON  FEMORAL TO ABOVE KNEE POPLITEAL ARTERY;  Surgeon: [name], MD;  Location: [place];  Service: Vascular;  Laterality: Left;   HEMORRHOID SURGERY N/A 04/06/2021   Procedure: HEMORRHOIDECTOMY 2 COLUMN;  Surgeon: Ileana Roup, MD;  Location: [place];  Service: General;  Laterality: N/A;   KNEE ARTHROSCOPY Right [place] N/A 04/06/2021   Procedure: ANORECTAL EXAM UNDER ANESTHESIA/ excision of anal polyp;  Surgeon: Ileana Roup, MD;  Location: [facility];  Service: General;  Laterality: N/A;   TONSILLECTOMY     at a very young age, but grew back   TOTAL HIP ARTHROPLASTY Right 05/25/2019   Procedure: RIGHT TOTAL HIP ARTHROPLASTY ANTERIOR APPROACH;  Surgeon: Leandrew Koyanagi, MD;  Location: [place];  Service: Orthopedics;  Laterality: Right;   TOTAL KNEE ARTHROPLASTY Right 09/04/2021   Procedure: RIGHT TOTAL KNEE ARTHROPLASTY;  Surgeon: Leandrew Koyanagi, MD;  Location: [place];  Service: Orthopedics;  Laterality: Right;   Patient Active Problem List   Diagnosis Date Noted   Status post total right knee replacement 09/04/2021   Leukocytosis 10/11/2020   PAD (peripheral artery disease) ([place]) 06/29/2020   Prediabetes 04/22/2020   Chronic kidney disease, stage 3b ([place]) 04/15/2020   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 04/12/2020   Lower urinary tract symptoms (LUTS) 04/12/2020   History of colon polyps 04/12/2020   Atherosclerosis of  native arteries of extremity with intermittent claudication ([place]) 03/01/2020   Pain in both feet 07/31/2019   Primary osteoarthritis of right knee 07/07/2019   Status post total replacement of right hip 05/25/2019   Lumbar radiculopathy 02/05/2019    PCP: Gena Fray  REFERRING PROVIDER: Lorelee New DIAG: S/P right TKA  THERAPY DIAG:  Acute pain of right knee  Stiffness of right knee, not elsewhere classified  Difficulty in walking, not elsewhere classified  Localized  edema  Rationale for Evaluation and Treatment Rehabilitation  ONSET DATE: 09/04/21  SUBJECTIVE:   SUBJECTIVE STATEMENT: Patient underwent a right TKA on 09/04/21.  Home health did okay, CPM is discharged, hade a one night stay in the hospital.  MD feels like he is lacking mostly extension.  Has had issues with calf pain, a recent doppler was negative.  Reports that he started using the [facility] yesterday  PERTINENT HISTORY: Lumbar fusion 2020, THA 2021  PAIN:  Are you having pain? Yes: NPRS scale: 2/10 Pain location: right knee, calf, leg and foot Pain description: feels heavy, ache Aggravating factors: touching the calf, stretching the back of the leg pain up to 10/10 Relieving factors: rest, pain meds, ice pain can be a 1/10  PRECAUTIONS: None  WEIGHT BEARING RESTRICTIONS No  FALLS:  Has patient fallen in last 6 months? No  LIVING ENVIRONMENT: Lives with: lives with their family Lives in: House/apartment Stairs: Yes: Internal: 16 steps; can reach both one at a time Has following equipment at home: Single point cane and Walker - 2 wheeled  OCCUPATION: retired  PLOF: Independent  PATIENT GOALS work some, no pain, walk without difficulty   OBJECTIVE:   PATIENT SURVEYS:  FOTO 23  COGNITION:  Overall cognitive status: Within functional limits for tasks assessed     SENSATION: WFL  EDEMA:  Circumferential: left mid patella 37 cm, right 40.5 cm  MUSCLE LENGTH: Very tight calf and HS  POSTURE: rounded shoulders and forward head  PALPATION: Edematous right knee, mild tender, very sore calf but reports that it is much better since Tuesday  LOWER EXTREMITY ROM:  Active Passive ROM Right AROM 09/21/21 Right  PROM 09/21/21  Hip flexion    Hip extension    Hip abduction    Hip adduction    Hip internal rotation    Hip external rotation    Knee flexion 107 112  Knee extension 35 12  Ankle dorsiflexion    Ankle plantarflexion    Ankle inversion    Ankle eversion      (Blank rows = not tested)  LOWER EXTREMITY MMT:  MMT Right eval Left eval  Hip flexion 3+ 4-  Hip extension    Hip abduction 3+ 4-  Hip adduction    Hip internal rotation    Hip external rotation    Knee flexion 4-   Knee extension 3+   Ankle dorsiflexion    Ankle plantarflexion    Ankle inversion    Ankle eversion     (Blank rows = not tested)  LOWER EXTREMITY SPECIAL TESTS:  Quad lag FUNCTIONAL TESTS:  Timed up and go (TUG): 24 seconds with SPC  GAIT: Distance walked: 100 feet Assistive device utilized: Single point cane Level of assistance: SBA Comments: mild stiffness with the knee , does not fully straighten out    TODAY'S TREATMENT: NuStep level 4 x 5 minutes Right knee Vaso medium pressure 34 degrees 10 minutes in elevation   PATIENT EDUCATION:  Education details: reviewed his current  HEP Person educated: Patient and Child(ren) Education method: Customer service manager Education comprehension: verbalized understanding   HOME EXERCISE PROGRAM: Continuation of what he is currently doing  ASSESSMENT:  CLINICAL IMPRESSION: Patient is a 71 y.o. male who was seen today for physical therapy evaluation and treatment for right TKA 09/04/21,  He is doing well, has mostly limitation in extension due to weakness, tends to fire the hip flexor instead of the quad.  Walking with SPC started yesterday, has limp on the right with decreased extension.  Has significant edema, mild pain at rest.    OBJECTIVE IMPAIRMENTS Abnormal gait, decreased activity tolerance, decreased balance, decreased endurance, decreased mobility, difficulty walking, decreased ROM, decreased strength, increased edema, impaired flexibility, and pain.   REHAB POTENTIAL: Good  CLINICAL DECISION MAKING: Stable/uncomplicated  EVALUATION COMPLEXITY: Low   GOALS: Goals reviewed with patient? Yes  SHORT TERM GOALS: Target date:10/05/21 Independent with initial HEP Goal status:  INITIAL  LONG TERM GOALS: Target date: 12/14/21  Independent with advanced HEP Goal status: INITIAL  2.  Decrease pain 50% Goal status: INITIAL  3.  Walk with minimal deviation and no device community distances Goal status: INITIAL  4.  Increase AROMof the right knee to 0-120 degrees flexion Goal status: INITIAL  5.  Increase strength of the right knee to 4+/5 Goal status: INITIAL  PLAN: PT FREQUENCY: 2x/week  PT DURATION: 12 weeks  PLANNED INTERVENTIONS: Therapeutic exercises, Therapeutic activity, Neuromuscular re-education, Balance training, Gait training, Patient/Family education, Self Care, Joint mobilization, Stair training, Electrical stimulation, Cryotherapy, scar mobilization, Vasopneumatic device, and Manual therapy  PLAN FOR NEXT SESSION: start strength and ROM and better gait   Haileigh Pitz W, PT 09/21/2021, 12:24 PM

## 2021-09-26 ENCOUNTER — Ambulatory Visit: Payer: Medicare Other | Admitting: Physical Therapy

## 2021-09-26 ENCOUNTER — Encounter: Payer: Self-pay | Admitting: Physical Therapy

## 2021-09-26 DIAGNOSIS — R600 Localized edema: Secondary | ICD-10-CM

## 2021-09-26 DIAGNOSIS — M6281 Muscle weakness (generalized): Secondary | ICD-10-CM

## 2021-09-26 DIAGNOSIS — M25661 Stiffness of right knee, not elsewhere classified: Secondary | ICD-10-CM

## 2021-09-26 DIAGNOSIS — R262 Difficulty in walking, not elsewhere classified: Secondary | ICD-10-CM

## 2021-09-26 DIAGNOSIS — M25561 Pain in right knee: Secondary | ICD-10-CM | POA: Diagnosis not present

## 2021-09-26 NOTE — Therapy (Signed)
OUTPATIENT PHYSICAL THERAPY LOWER EXTREMITY EVALUATION   Patient Name: Gerald Carpenter MRN: [id number] DOB:[birth year], 71 y.o., male Today's Date: 09/26/2021   PT End of Session - 09/26/21 0800     Visit Number 2    Date for PT Re-Evaluation 12/22/21    PT Start Time 0800    PT Stop Time 0845    PT Time Calculation (min) 45 min    Activity Tolerance Patient tolerated treatment well    Behavior During Therapy [facility] for tasks assessed/performed             Past Medical History:  Diagnosis Date   Alcoholism ([place])    Arthritis    Cataract    Colon polyps    Complication of anesthesia    HLD (hyperlipidemia)    Nephritis    when pt. was 7 yrs. ols   Peripheral vascular disease (HCC)    PONV (postoperative nausea and vomiting)    "Only when I have aortagram on 06/23/20".   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   Substance abuse [facility])    Past Surgical History:  Procedure Laterality Date   ABDOMINAL AORTOGRAM W/LOWER EXTREMITY N/A 06/23/2020   Procedure: ABDOMINAL AORTOGRAM W/LOWER EXTREMITY;  Surgeon: [name], MD;  Location: [place] [facility];  Service: Cardiovascular;  Laterality: N/A;   ANTERIOR LAT LUMBAR FUSION Right 02/05/2019   Procedure: ATTEMPTED ANTERIOR LATERAL INTERBODY FUSION;  Surgeon: Consuella Lose, MD;  Location: [place];  Service: Neurosurgery;  Laterality: Right;  RIGHT LATERAL TRANSPSOAS DISCECTOMY AND FUSION WITH ROBOTIC ASSISTED PEDICLE SCREW STABILIZATION, LUMBAR 4- LUMBAR 5   CATARACT EXTRACTION W/ INTRAOCULAR LENS  IMPLANT, BILATERAL Bilateral 2013   lens implants for cataracts after Lasik   COLONOSCOPY     ENDOVEIN HARVEST OF GREATER SAPHENOUS VEIN Left 06/29/2020   Procedure: HARVEST OF LEFT GREATER SAPHENOUS VEIN;  Surgeon: [name], MD;  Location: [place];  Service: Vascular;  Laterality: Left;   FEMORAL-POPLITEAL BYPASS GRAFT Left 06/29/2020   Procedure: BYPASS GRAFT LEFT COMMON FEMORAL TO ABOVE KNEE POPLITEAL  ARTERY;  Surgeon: [name], MD;  Location: [place];  Service: Vascular;  Laterality: Left;   HEMORRHOID SURGERY N/A 04/06/2021   Procedure: HEMORRHOIDECTOMY 2 COLUMN;  Surgeon: Ileana Roup, MD;  Location: [place];  Service: General;  Laterality: N/A;   KNEE ARTHROSCOPY Right [place] N/A 04/06/2021   Procedure: ANORECTAL EXAM UNDER ANESTHESIA/ excision of anal polyp;  Surgeon: Ileana Roup, MD;  Location: [facility];  Service: General;  Laterality: N/A;   TONSILLECTOMY     at a very young age, but grew back   TOTAL HIP ARTHROPLASTY Right 05/25/2019   Procedure: RIGHT TOTAL HIP ARTHROPLASTY ANTERIOR APPROACH;  Surgeon: Leandrew Koyanagi, MD;  Location: [place];  Service: Orthopedics;  Laterality: Right;   TOTAL KNEE ARTHROPLASTY Right 09/04/2021   Procedure: RIGHT TOTAL KNEE ARTHROPLASTY;  Surgeon: Leandrew Koyanagi, MD;  Location: [place];  Service: Orthopedics;  Laterality: Right;   Patient Active Problem List   Diagnosis Date Noted   Status post total right knee replacement 09/04/2021   Leukocytosis 10/11/2020   PAD (peripheral artery disease) ([place]) 06/29/2020   Prediabetes 04/22/2020   Chronic kidney disease, stage 3b ([place]) 04/15/2020   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 04/12/2020   Lower urinary tract symptoms (LUTS) 04/12/2020   History of colon polyps 04/12/2020   Atherosclerosis of native arteries of extremity with intermittent  claudication ([place]) 03/01/2020   Pain in both feet 07/31/2019   Primary osteoarthritis of right knee 07/07/2019   Status post total replacement of right hip 05/25/2019   Lumbar radiculopathy 02/05/2019    PCP: Gena Fray  REFERRING PROVIDER: Lorelee New DIAG: S/P right TKA  THERAPY DIAG:  Acute pain of right knee  Stiffness of right knee, not elsewhere classified  Difficulty in walking, not elsewhere classified  Localized edema  Muscle weakness  (generalized)  Rationale for Evaluation and Treatment Rehabilitation  ONSET DATE: 09/04/21  SUBJECTIVE:   SUBJECTIVE STATEMENT: "Take it easy on me today" Difficulty sleeping, having a lot pain  PERTINENT HISTORY: Lumbar fusion 2020, THA 2021  PAIN:  Are you having pain? Yes: NPRS scale: 2/10 Pain location: right knee, calf, leg and foot Pain description: feels heavy, ache Aggravating factors: touching the calf, stretching the back of the leg pain up to 10/10 Relieving factors: rest, pain meds, ice pain can be a 1/10  PRECAUTIONS: None  WEIGHT BEARING RESTRICTIONS No  FALLS:  Has patient fallen in last 6 months? No  LIVING ENVIRONMENT: Lives with: lives with their family Lives in: House/apartment Stairs: Yes: Internal: 16 steps; can reach both one at a time Has following equipment at home: Single point cane and Walker - 2 wheeled  OCCUPATION: retired  PLOF: Independent  PATIENT GOALS work some, no pain, walk without difficulty   OBJECTIVE:  EDEMA:  Circumferential: left mid patella 37 cm, right 40.5 cm  MUSCLE LENGTH: Very tight calf and HS  POSTURE: rounded shoulders and forward head  PALPATION: Edematous right knee, mild tender, very sore calf but reports that it is much better since Tuesday  LOWER EXTREMITY ROM:  Active Passive ROM Right AROM 09/21/21 Right  PROM 09/21/21  Hip flexion    Hip extension    Hip abduction    Hip adduction    Hip internal rotation    Hip external rotation    Knee flexion 107 112  Knee extension 35 12  Ankle dorsiflexion    Ankle plantarflexion    Ankle inversion    Ankle eversion     (Blank rows = not tested)  LOWER EXTREMITY MMT:  MMT Right eval Left eval  Hip flexion 3+ 4-  Hip extension    Hip abduction 3+ 4-  Hip adduction    Hip internal rotation    Hip external rotation    Knee flexion 4-   Knee extension 3+   Ankle dorsiflexion    Ankle plantarflexion    Ankle inversion    Ankle eversion      (Blank rows = not tested)   FUNCTIONAL TESTS:  Eval: Timed up and go (TUG): 24 seconds with SPC  GAIT: Distance walked: 100 feet Assistive device utilized: Single point cane Level of assistance: SBA Comments: mild stiffness with the knee , does not fully straighten out    TODAY'S TREATMENT: 09/26/21 NuStep L3 x 6 min  RLE quad Sets x10 x5  R knee PROM RLE LAQ 2lb 2x10 RLE HS curls green 2x15   Sit to stands 2x10   Standing March w/ SPC x10    Vaso R knee Low 34 deg x10  Eval NuStep level 4 x 5 minutes Right knee Vaso medium pressure 34 degrees 10 minutes in elevation   PATIENT EDUCATION:  Education details: reviewed his current HEP Person educated: Patient and Child(ren) Education method: Customer service manager Education comprehension: verbalized understanding   HOME EXERCISE PROGRAM: Continuation of what  he is currently doing  ASSESSMENT:  CLINICAL IMPRESSION: Pt enters with reports of fatigue and R knee pain. Despite subjective reports he did really well completing interventions. R knee ROM is well but he has some weakness present. Some difficulty achieving quad contraction with squad sets.    OBJECTIVE IMPAIRMENTS Abnormal gait, decreased activity tolerance, decreased balance, decreased endurance, decreased mobility, difficulty walking, decreased ROM, decreased strength, increased edema, impaired flexibility, and pain.   REHAB POTENTIAL: Good  CLINICAL DECISION MAKING: Stable/uncomplicated  EVALUATION COMPLEXITY: Low   GOALS: Goals reviewed with patient? Yes  SHORT TERM GOALS: Target date:10/05/21 Independent with initial HEP Goal status: INITIAL  LONG TERM GOALS: Target date: 12/14/21  Independent with advanced HEP Goal status: INITIAL  2.  Decrease pain 50% Goal status: INITIAL  3.  Walk with minimal deviation and no device community distances Goal status: INITIAL  4.  Increase AROMof the right knee to 0-120 degrees flexion Goal  status: INITIAL  5.  Increase strength of the right knee to 4+/5 Goal status: INITIAL  PLAN: PT FREQUENCY: 2x/week  PT DURATION: 12 weeks  PLANNED INTERVENTIONS: Therapeutic exercises, Therapeutic activity, Neuromuscular re-education, Balance training, Gait training, Patient/Family education, Self Care, Joint mobilization, Stair training, Electrical stimulation, Cryotherapy, scar mobilization, Vasopneumatic device, and Manual therapy  PLAN FOR NEXT SESSION: start strength and ROM and better gait   [name], PTA 09/26/2021, 8:01 AM

## 2021-09-28 ENCOUNTER — Ambulatory Visit: Payer: Medicare Other | Admitting: Physical Therapy

## 2021-09-28 ENCOUNTER — Encounter: Payer: Self-pay | Admitting: Physical Therapy

## 2021-09-28 DIAGNOSIS — R262 Difficulty in walking, not elsewhere classified: Secondary | ICD-10-CM

## 2021-09-28 DIAGNOSIS — M25661 Stiffness of right knee, not elsewhere classified: Secondary | ICD-10-CM

## 2021-09-28 DIAGNOSIS — M25561 Pain in right knee: Secondary | ICD-10-CM

## 2021-09-28 DIAGNOSIS — R600 Localized edema: Secondary | ICD-10-CM

## 2021-09-28 DIAGNOSIS — M6281 Muscle weakness (generalized): Secondary | ICD-10-CM

## 2021-09-28 NOTE — Therapy (Signed)
OUTPATIENT PHYSICAL THERAPY LOWER EXTREMITY EVALUATION   Patient Name: Gerald Carpenter MRN: [id number] DOB:09-Aug-1950, 71 y.o., male Today's Date: 09/28/2021   PT End of Session - 09/28/21 0756     Visit Number 3    Date for PT Re-Evaluation 12/22/21    PT Start Time 0758    PT Stop Time 0849    PT Time Calculation (min) 51 min    Activity Tolerance Patient tolerated treatment well    Behavior During Therapy [facility] for tasks assessed/performed             Past Medical History:  Diagnosis Date   Alcoholism ([place])    Arthritis    Cataract    Colon polyps    Complication of anesthesia    HLD (hyperlipidemia)    Nephritis    when pt. was 7 yrs. ols   Peripheral vascular disease (HCC)    PONV (postoperative nausea and vomiting)    "Only when I have aortagram on 06/23/20".   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   Substance abuse [facility])    Past Surgical History:  Procedure Laterality Date   ABDOMINAL AORTOGRAM W/LOWER EXTREMITY N/A 06/23/2020   Procedure: ABDOMINAL AORTOGRAM W/LOWER EXTREMITY;  Surgeon: [name], MD;  Location: [place] CV LAB;  Service: Cardiovascular;  Laterality: N/A;   ANTERIOR LAT LUMBAR FUSION Right 02/05/2019   Procedure: ATTEMPTED ANTERIOR LATERAL INTERBODY FUSION;  Surgeon: Consuella Lose, MD;  Location: [place];  Service: Neurosurgery;  Laterality: Right;  RIGHT LATERAL TRANSPSOAS DISCECTOMY AND FUSION WITH ROBOTIC ASSISTED PEDICLE SCREW STABILIZATION, LUMBAR 4- LUMBAR 5   CATARACT EXTRACTION W/ INTRAOCULAR LENS  IMPLANT, BILATERAL Bilateral 2013   lens implants for cataracts after Lasik   COLONOSCOPY     ENDOVEIN HARVEST OF GREATER SAPHENOUS VEIN Left 06/29/2020   Procedure: HARVEST OF LEFT GREATER SAPHENOUS VEIN;  Surgeon: [name], MD;  Location: [place];  Service: Vascular;  Laterality: Left;   FEMORAL-POPLITEAL BYPASS GRAFT Left 06/29/2020   Procedure: BYPASS GRAFT LEFT COMMON FEMORAL TO ABOVE KNEE POPLITEAL  ARTERY;  Surgeon: [name], MD;  Location: [place];  Service: Vascular;  Laterality: Left;   HEMORRHOID SURGERY N/A 04/06/2021   Procedure: HEMORRHOIDECTOMY 2 COLUMN;  Surgeon: Ileana Roup, MD;  Location: [place];  Service: General;  Laterality: N/A;   KNEE ARTHROSCOPY Right [place] N/A 04/06/2021   Procedure: ANORECTAL EXAM UNDER ANESTHESIA/ excision of anal polyp;  Surgeon: Ileana Roup, MD;  Location: [facility];  Service: General;  Laterality: N/A;   TONSILLECTOMY     at a very young age, but grew back   TOTAL HIP ARTHROPLASTY Right 05/25/2019   Procedure: RIGHT TOTAL HIP ARTHROPLASTY ANTERIOR APPROACH;  Surgeon: Leandrew Koyanagi, MD;  Location: [place];  Service: Orthopedics;  Laterality: Right;   TOTAL KNEE ARTHROPLASTY Right 09/04/2021   Procedure: RIGHT TOTAL KNEE ARTHROPLASTY;  Surgeon: Leandrew Koyanagi, MD;  Location: [place];  Service: Orthopedics;  Laterality: Right;   Patient Active Problem List   Diagnosis Date Noted   Status post total right knee replacement 09/04/2021   Leukocytosis 10/11/2020   PAD (peripheral artery disease) ([place]) 06/29/2020   Prediabetes 04/22/2020   Chronic kidney disease, stage 3b ([place]) 04/15/2020   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 04/12/2020   Lower urinary tract symptoms (LUTS) 04/12/2020   History of colon polyps 04/12/2020   Atherosclerosis of native arteries of extremity with intermittent  claudication ([place]) 03/01/2020   Pain in both feet 07/31/2019   Primary osteoarthritis of right knee 07/07/2019   Status post total replacement of right hip 05/25/2019   Lumbar radiculopathy 02/05/2019    PCP: Gena Fray  REFERRING PROVIDER: Lorelee New DIAG: S/P right TKA  THERAPY DIAG:  Acute pain of right knee  Stiffness of right knee, not elsewhere classified  Difficulty in walking, not elsewhere classified  Localized edema  Muscle weakness  (generalized)  Rationale for Evaluation and Treatment Rehabilitation  ONSET DATE: 09/04/21  SUBJECTIVE:   SUBJECTIVE STATEMENT: "A little stiff this morning"  PERTINENT HISTORY: Lumbar fusion 2020, THA 2021  PAIN:  Are you having pain? Yes: NPRS scale: 2/10 Pain location: right knee, calf, leg and foot Pain description: feels heavy, ache Aggravating factors: touching the calf, stretching the back of the leg pain up to 10/10 Relieving factors: rest, pain meds, ice pain can be a 1/10  PRECAUTIONS: None  WEIGHT BEARING RESTRICTIONS No  FALLS:  Has patient fallen in last 6 months? No  LIVING ENVIRONMENT: Lives with: lives with their family Lives in: House/apartment Stairs: Yes: Internal: 16 steps; can reach both one at a time Has following equipment at home: Single point cane and Walker - 2 wheeled  OCCUPATION: retired  PLOF: Independent  PATIENT GOALS work some, no pain, walk without difficulty   OBJECTIVE:  EDEMA:  Circumferential: left mid patella 37 cm, right 40.5 cm  MUSCLE LENGTH: Very tight calf and HS  POSTURE: rounded shoulders and forward head  PALPATION: Edematous right knee, mild tender, very sore calf but reports that it is much better since Tuesday  LOWER EXTREMITY ROM:  Active Passive ROM Right AROM 09/21/21 Right  PROM 09/21/21  Hip flexion    Hip extension    Hip abduction    Hip adduction    Hip internal rotation    Hip external rotation    Knee flexion 107 112  Knee extension 35 12  Ankle dorsiflexion    Ankle plantarflexion    Ankle inversion    Ankle eversion     (Blank rows = not tested)  LOWER EXTREMITY MMT:  MMT Right eval Left eval  Hip flexion 3+ 4-  Hip extension    Hip abduction 3+ 4-  Hip adduction    Hip internal rotation    Hip external rotation    Knee flexion 4-   Knee extension 3+   Ankle dorsiflexion    Ankle plantarflexion    Ankle inversion    Ankle eversion     (Blank rows = not  tested)   FUNCTIONAL TESTS:  Eval: Timed up and go (TUG): 24 seconds with SPC  GAIT: Distance walked: 100 feet Assistive device utilized: Single point cane Level of assistance: SBA Comments: mild stiffness with the knee , does not fully straighten out    TODAY'S TREATMENT: 09/28/21 Recumbent bike L1.5 x6 min  Passive R knee Ext  S2S 2x10 RLE LAQ 3lb 2x10    RLE HS curls green 2x15   Leg Press 20lb 2x10   Heel raises 2x15   Gait 16f x2 No AD decrease stance time on RLE    Vaso R knee medium 34 deg x10  09/26/21 NuStep L3 x 6 min  RLE quad Sets x10 x5  R knee PROM RLE LAQ 2lb 2x10 RLE HS curls green 2x15   Sit to stands 2x10   Standing March w/ SPC x10    Vaso R knee Low 34 deg  x10  Eval NuStep level 4 x 5 minutes Right knee Vaso medium pressure 34 degrees 10 minutes in elevation   PATIENT EDUCATION:  Education details: reviewed his current HEP Person educated: Patient and Child(ren) Education method: Customer service manager Education comprehension: verbalized understanding   HOME EXERCISE PROGRAM: Continuation of what he is currently doing  ASSESSMENT:  CLINICAL IMPRESSION: Pt enters with reports of stiffness.R knee ROM remains very well. Session progressed with LE strengtheingins ad gait without AD. Encouragement needed for pt too ambulation without AD due to fear of RLE giving out. Some assist needed with SL TKE on leg press. RLE fatigue quick with LAQ,  OBJECTIVE IMPAIRMENTS Abnormal gait, decreased activity tolerance, decreased balance, decreased endurance, decreased mobility, difficulty walking, decreased ROM, decreased strength, increased edema, impaired flexibility, and pain.   REHAB POTENTIAL: Good  CLINICAL DECISION MAKING: Stable/uncomplicated  EVALUATION COMPLEXITY: Low   GOALS: Goals reviewed with patient? Yes  SHORT TERM GOALS: Target date:10/05/21 Independent with initial HEP Goal status: met  LONG TERM GOALS: Target date:  12/14/21  Independent with advanced HEP Goal status: INITIAL  2.  Decrease pain 50% Goal status: INITIAL  3.  Walk with minimal deviation and no device community distances Goal status: progressing  4.  Increase AROMof the right knee to 0-120 degrees flexion Goal status: INITIAL  5.  Increase strength of the right knee to 4+/5 Goal status: INITIAL  PLAN: PT FREQUENCY: 2x/week  PT DURATION: 12 weeks  PLANNED INTERVENTIONS: Therapeutic exercises, Therapeutic activity, Neuromuscular re-education, Balance training, Gait training, Patient/Family education, Self Care, Joint mobilization, Stair training, Electrical stimulation, Cryotherapy, scar mobilization, Vasopneumatic device, and Manual therapy  PLAN FOR NEXT SESSION: start strength and ROM and better gait   [name], PTA 09/28/2021, 8:40 AM

## 2021-10-03 ENCOUNTER — Encounter: Payer: Self-pay | Admitting: Physical Therapy

## 2021-10-03 ENCOUNTER — Ambulatory Visit: Payer: Medicare Other | Admitting: Physical Therapy

## 2021-10-03 DIAGNOSIS — M25561 Pain in right knee: Secondary | ICD-10-CM

## 2021-10-03 DIAGNOSIS — R262 Difficulty in walking, not elsewhere classified: Secondary | ICD-10-CM

## 2021-10-03 DIAGNOSIS — R600 Localized edema: Secondary | ICD-10-CM

## 2021-10-03 DIAGNOSIS — M6281 Muscle weakness (generalized): Secondary | ICD-10-CM

## 2021-10-03 DIAGNOSIS — M25661 Stiffness of right knee, not elsewhere classified: Secondary | ICD-10-CM

## 2021-10-03 NOTE — Therapy (Signed)
OUTPATIENT PHYSICAL THERAPY LOWER EXTREMITY Treatment   Patient Name: Gerald Carpenter MRN: [id number] DOB:[birth year], 71 y.o., male Today's Date: 10/03/2021   PT End of Session - 10/03/21 0747     Visit Number 4    Number of Visits 20    Date for PT Re-Evaluation 12/22/21    Authorization Type Medicare    PT Start Time 0745    PT Stop Time 0842    PT Time Calculation (min) 57 min    Activity Tolerance Patient tolerated treatment well    Behavior During Therapy [facility] for tasks assessed/performed             Past Medical History:  Diagnosis Date   Alcoholism ([place])    Arthritis    Cataract    Colon polyps    Complication of anesthesia    HLD (hyperlipidemia)    Nephritis    when pt. was 7 yrs. ols   Peripheral vascular disease (HCC)    PONV (postoperative nausea and vomiting)    "Only when I have aortagram on 06/23/20".   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   Substance abuse [facility])    Past Surgical History:  Procedure Laterality Date   ABDOMINAL AORTOGRAM W/LOWER EXTREMITY N/A 06/23/2020   Procedure: ABDOMINAL AORTOGRAM W/LOWER EXTREMITY;  Surgeon: [name], MD;  Location: [place] CV LAB;  Service: Cardiovascular;  Laterality: N/A;   ANTERIOR LAT LUMBAR FUSION Right 02/05/2019   Procedure: ATTEMPTED ANTERIOR LATERAL INTERBODY FUSION;  Surgeon: Consuella Lose, MD;  Location: [place];  Service: Neurosurgery;  Laterality: Right;  RIGHT LATERAL TRANSPSOAS DISCECTOMY AND FUSION WITH ROBOTIC ASSISTED PEDICLE SCREW STABILIZATION, LUMBAR 4- LUMBAR 5   CATARACT EXTRACTION W/ INTRAOCULAR LENS  IMPLANT, BILATERAL Bilateral 2013   lens implants for cataracts after Lasik   COLONOSCOPY     ENDOVEIN HARVEST OF GREATER SAPHENOUS VEIN Left 06/29/2020   Procedure: HARVEST OF LEFT GREATER SAPHENOUS VEIN;  Surgeon: [name], MD;  Location: [place];  Service: Vascular;  Laterality: Left;   FEMORAL-POPLITEAL BYPASS GRAFT Left 06/29/2020   Procedure:  BYPASS GRAFT LEFT COMMON FEMORAL TO ABOVE KNEE POPLITEAL ARTERY;  Surgeon: [name], MD;  Location: [place];  Service: Vascular;  Laterality: Left;   HEMORRHOID SURGERY N/A 04/06/2021   Procedure: HEMORRHOIDECTOMY 2 COLUMN;  Surgeon: Ileana Roup, MD;  Location: [place];  Service: General;  Laterality: N/A;   KNEE ARTHROSCOPY Right [place] N/A 04/06/2021   Procedure: ANORECTAL EXAM UNDER ANESTHESIA/ excision of anal polyp;  Surgeon: Ileana Roup, MD;  Location: [facility];  Service: General;  Laterality: N/A;   TONSILLECTOMY     at a very young age, but grew back   TOTAL HIP ARTHROPLASTY Right 05/25/2019   Procedure: RIGHT TOTAL HIP ARTHROPLASTY ANTERIOR APPROACH;  Surgeon: Leandrew Koyanagi, MD;  Location: [place];  Service: Orthopedics;  Laterality: Right;   TOTAL KNEE ARTHROPLASTY Right 09/04/2021   Procedure: RIGHT TOTAL KNEE ARTHROPLASTY;  Surgeon: Leandrew Koyanagi, MD;  Location: [place];  Service: Orthopedics;  Laterality: Right;   Patient Active Problem List   Diagnosis Date Noted   Status post total right knee replacement 09/04/2021   Leukocytosis 10/11/2020   PAD (peripheral artery disease) ([place]) 06/29/2020   Prediabetes 04/22/2020   Chronic kidney disease, stage 3b ([place]) 04/15/2020   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 04/12/2020   Lower urinary tract symptoms (LUTS) 04/12/2020   History of  colon polyps 04/12/2020   Atherosclerosis of native arteries of extremity with intermittent claudication ([place]) 03/01/2020   Pain in both feet 07/31/2019   Primary osteoarthritis of right knee 07/07/2019   Status post total replacement of right hip 05/25/2019   Lumbar radiculopathy 02/05/2019    PCP: Gena Fray  REFERRING PROVIDER: Lorelee New DIAG: S/P right TKA  THERAPY DIAG:  Acute pain of right knee  Stiffness of right knee, not elsewhere classified  Difficulty in walking, not elsewhere  classified  Localized edema  Muscle weakness (generalized)  Rationale for Evaluation and Treatment Rehabilitation  ONSET DATE: 09/04/21  SUBJECTIVE:   SUBJECTIVE STATEMENT: "Pain is a 6-7/10 at night, reports not bad during the day, difficulty putting shoes on due to edema  PERTINENT HISTORY: Lumbar fusion 2020, THA 2021  PAIN:  Are you having pain? Yes: NPRS scale: 2/10 Pain location: right knee, calf, leg and foot Pain description: feels heavy, ache Aggravating factors: touching the calf, stretching the back of the leg pain up to 10/10 Relieving factors: rest, pain meds, ice pain can be a 1/10  PRECAUTIONS: None  WEIGHT BEARING RESTRICTIONS No  FALLS:  Has patient fallen in last 6 months? No  LIVING ENVIRONMENT: Lives with: lives with their family Lives in: House/apartment Stairs: Yes: Internal: 16 steps; can reach both one at a time Has following equipment at home: Single point cane and Walker - 2 wheeled  OCCUPATION: retired  PLOF: Independent  PATIENT GOALS work some, no pain, walk without difficulty   OBJECTIVE:  EDEMA:  Circumferential: left mid patella 37 cm, right 40.5 cm  MUSCLE LENGTH: Very tight calf and HS  POSTURE: rounded shoulders and forward head  PALPATION: Edematous right knee, mild tender, very sore calf but reports that it is much better since Tuesday  LOWER EXTREMITY ROM:  Active Passive ROM Right AROM 09/21/21 Right  PROM 09/21/21 Right  AROM 10/03/21  Hip flexion     Hip extension     Hip abduction     Hip adduction     Hip internal rotation     Hip external rotation     Knee flexion 107 112 115  Knee extension 35 12 20  Ankle dorsiflexion     Ankle plantarflexion     Ankle inversion     Ankle eversion      (Blank rows = not tested)  LOWER EXTREMITY MMT:  MMT Right eval Left eval  Hip flexion 3+ 4-  Hip extension    Hip abduction 3+ 4-  Hip adduction    Hip internal rotation    Hip external rotation    Knee  flexion 4-   Knee extension 3+   Ankle dorsiflexion    Ankle plantarflexion    Ankle inversion    Ankle eversion     (Blank rows = not tested)   FUNCTIONAL TESTS:  Eval: Timed up and go (TUG): 24 seconds with SPC  GAIT: Distance walked: 100 feet Assistive device utilized: Single point cane Level of assistance: SBA Comments: mild stiffness with the knee , does not fully straighten out    TODAY'S TREATMENT: 10/03/21 Nustep level 5 x 6 minutes TUG 16 seconds without device On airex marching and weight shifting for proprioception Leg curls 20# 2x10 LEg extension 5# 2x10 Side stepping on and off airex Leg press 20# 2x10, then right only 2x10 small ROM working on the control and not snapping knee Resisted gait 30# all directions Vaso medium pressure right knee in elevation  09/28/21 Recumbent bike L1.5 x6 min  Passive R knee Ext  S2S 2x10 RLE LAQ 3lb 2x10    RLE HS curls green 2x15   Leg Press 20lb 2x10   Heel raises 2x15   Gait 57f x2 No AD decrease stance time on RLE    Vaso R knee medium 34 deg x10  09/26/21 NuStep L3 x 6 min  RLE quad Sets x10 x5  R knee PROM RLE LAQ 2lb 2x10 RLE HS curls green 2x15   Sit to stands 2x10   Standing March w/ SPC x10    Vaso R knee Low 34 deg x10  Eval NuStep level 4 x 5 minutes Right knee Vaso medium pressure 34 degrees 10 minutes in elevation   PATIENT EDUCATION:  Education details: reviewed his current HEP Person educated: Patient and Child(ren) Education method: ECustomer service managerEducation comprehension: verbalized understanding   HOME EXERCISE PROGRAM: Continuation of what he is currently doing  ASSESSMENT:  CLINICAL IMPRESSION: Patient improving with TUG and AROM.  His biggest c/o is pain at night and the knee buckling, I added some increased proprioception today he had two instances of knee buckling with the higher level activities  OBJECTIVE IMPAIRMENTS Abnormal gait, decreased activity tolerance,  decreased balance, decreased endurance, decreased mobility, difficulty walking, decreased ROM, decreased strength, increased edema, impaired flexibility, and pain.   REHAB POTENTIAL: Good  CLINICAL DECISION MAKING: Stable/uncomplicated  EVALUATION COMPLEXITY: Low   GOALS: Goals reviewed with patient? Yes  SHORT TERM GOALS: Target date:10/05/21 Independent with initial HEP Goal status: met  LONG TERM GOALS: Target date: 12/14/21  Independent with advanced HEP Goal status: INITIAL  2.  Decrease pain 50% Goal status: on going  3.  Walk with minimal deviation and no device community distances Goal status: progressing  4.  Increase AROMof the right knee to 0-120 degrees flexion Goal status: INITIAL  5.  Increase strength of the right knee to 4+/5 Goal status: INITIAL  PLAN: PT FREQUENCY: 2x/week  PT DURATION: 12 weeks  PLANNED INTERVENTIONS: Therapeutic exercises, Therapeutic activity, Neuromuscular re-education, Balance training, Gait training, Patient/Family education, Self Care, Joint mobilization, Stair training, Electrical stimulation, Cryotherapy, scar mobilization, Vasopneumatic device, and Manual therapy  PLAN FOR NEXT SESSION: continue to add proprioception   Bemnet Trovato W, PT 10/03/2021, 7:48 AM

## 2021-10-05 ENCOUNTER — Ambulatory Visit: Payer: Medicare Other | Admitting: Physical Therapy

## 2021-10-05 DIAGNOSIS — M25661 Stiffness of right knee, not elsewhere classified: Secondary | ICD-10-CM

## 2021-10-05 DIAGNOSIS — M25561 Pain in right knee: Secondary | ICD-10-CM

## 2021-10-05 DIAGNOSIS — R600 Localized edema: Secondary | ICD-10-CM

## 2021-10-05 DIAGNOSIS — R262 Difficulty in walking, not elsewhere classified: Secondary | ICD-10-CM

## 2021-10-05 NOTE — Therapy (Signed)
OUTPATIENT PHYSICAL THERAPY LOWER EXTREMITY Treatment   Patient Name: Gerald Carpenter MRN: [id number] DOB:October 26, 1950, 71 y.o., male Today's Date: 10/05/2021   PT End of Session - 10/05/21 0752     Visit Number 5    Number of Visits 20    Date for PT Re-Evaluation 12/22/21    Authorization Type Medicare    PT Start Time 0748    PT Stop Time 0845    PT Time Calculation (min) 57 min             Past Medical History:  Diagnosis Date   Alcoholism ([place])    Arthritis    Cataract    Colon polyps    Complication of anesthesia    HLD (hyperlipidemia)    Nephritis    when pt. was 7 yrs. ols   Peripheral vascular disease (HCC)    PONV (postoperative nausea and vomiting)    "Only when I have aortagram on 06/23/20".   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   Substance abuse [facility])    Past Surgical History:  Procedure Laterality Date   ABDOMINAL AORTOGRAM W/LOWER EXTREMITY N/A 06/23/2020   Procedure: ABDOMINAL AORTOGRAM W/LOWER EXTREMITY;  Surgeon: [name], MD;  Location: [place] [facility];  Service: Cardiovascular;  Laterality: N/A;   ANTERIOR LAT LUMBAR FUSION Right 02/05/2019   Procedure: ATTEMPTED ANTERIOR LATERAL INTERBODY FUSION;  Surgeon: Consuella Lose, MD;  Location: [place];  Service: Neurosurgery;  Laterality: Right;  RIGHT LATERAL TRANSPSOAS DISCECTOMY AND FUSION WITH ROBOTIC ASSISTED PEDICLE SCREW STABILIZATION, LUMBAR 4- LUMBAR 5   CATARACT EXTRACTION W/ INTRAOCULAR LENS  IMPLANT, BILATERAL Bilateral 2013   lens implants for cataracts after Lasik   COLONOSCOPY     ENDOVEIN HARVEST OF GREATER SAPHENOUS VEIN Left 06/29/2020   Procedure: HARVEST OF LEFT GREATER SAPHENOUS VEIN;  Surgeon: [name], MD;  Location: [place];  Service: Vascular;  Laterality: Left;   FEMORAL-POPLITEAL BYPASS GRAFT Left 06/29/2020   Procedure: BYPASS GRAFT LEFT COMMON FEMORAL TO ABOVE KNEE POPLITEAL ARTERY;  Surgeon: [name], MD;  Location: [place];   Service: Vascular;  Laterality: Left;   HEMORRHOID SURGERY N/A 04/06/2021   Procedure: HEMORRHOIDECTOMY 2 COLUMN;  Surgeon: Ileana Roup, MD;  Location: [place];  Service: General;  Laterality: N/A;   KNEE ARTHROSCOPY Right [place] N/A 04/06/2021   Procedure: ANORECTAL EXAM UNDER ANESTHESIA/ excision of anal polyp;  Surgeon: Ileana Roup, MD;  Location: [facility];  Service: General;  Laterality: N/A;   TONSILLECTOMY     at a very young age, but grew back   TOTAL HIP ARTHROPLASTY Right 05/25/2019   Procedure: RIGHT TOTAL HIP ARTHROPLASTY ANTERIOR APPROACH;  Surgeon: Leandrew Koyanagi, MD;  Location: [place];  Service: Orthopedics;  Laterality: Right;   TOTAL KNEE ARTHROPLASTY Right 09/04/2021   Procedure: RIGHT TOTAL KNEE ARTHROPLASTY;  Surgeon: Leandrew Koyanagi, MD;  Location: [place];  Service: Orthopedics;  Laterality: Right;   Patient Active Problem List   Diagnosis Date Noted   Status post total right knee replacement 09/04/2021   Leukocytosis 10/11/2020   PAD (peripheral artery disease) ([place]) 06/29/2020   Prediabetes 04/22/2020   Chronic kidney disease, stage 3b ([place]) 04/15/2020   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 04/12/2020   Lower urinary tract symptoms (LUTS) 04/12/2020   History of colon polyps 04/12/2020   Atherosclerosis of native arteries of extremity with intermittent claudication ([place]) 03/01/2020   Pain  in both feet 07/31/2019   Primary osteoarthritis of right knee 07/07/2019   Status post total replacement of right hip 05/25/2019   Lumbar radiculopathy 02/05/2019    PCP: Gena Fray  REFERRING PROVIDER: Lorelee New DIAG: S/P right TKA  THERAPY DIAG:  Acute pain of right knee  Stiffness of right knee, not elsewhere classified  Difficulty in walking, not elsewhere classified  Localized edema  Rationale for Evaluation and Treatment Rehabilitation  ONSET DATE: 09/04/21  SUBJECTIVE:    SUBJECTIVE STATEMENT: Last night was a great night, first one in a while, but days of therapy are rough nights  PERTINENT HISTORY: Lumbar fusion 2020, THA 2021  PAIN:  Are you having pain? Yes: NPRS scale: 2/10 Pain location: right knee, calf, leg and foot Pain description: feels heavy, ache Aggravating factors: touching the calf, stretching the back of the leg pain up to 10/10 Relieving factors: rest, pain meds, ice pain can be a 1/10  PRECAUTIONS: None  WEIGHT BEARING RESTRICTIONS No  FALLS:  Has patient fallen in last 6 months? No  LIVING ENVIRONMENT: Lives with: lives with their family Lives in: House/apartment Stairs: Yes: Internal: 16 steps; can reach both one at a time Has following equipment at home: Single point cane and Walker - 2 wheeled  OCCUPATION: retired  PLOF: Independent  PATIENT GOALS work some, no pain, walk without difficulty   OBJECTIVE:  EDEMA:  Circumferential: left mid patella 37 cm, right 40.5 cm  MUSCLE LENGTH: Very tight calf and HS  POSTURE: rounded shoulders and forward head  PALPATION: Edematous right knee, mild tender, very sore calf but reports that it is much better since Tuesday  LOWER EXTREMITY ROM:  Active Passive ROM Right AROM 09/21/21 Right  PROM 09/21/21 Right  AROM 10/03/21  Hip flexion     Hip extension     Hip abduction     Hip adduction     Hip internal rotation     Hip external rotation     Knee flexion 107 112 115  Knee extension 35 12 20  Ankle dorsiflexion     Ankle plantarflexion     Ankle inversion     Ankle eversion      (Blank rows = not tested)  LOWER EXTREMITY MMT:  MMT Right eval Left eval  Hip flexion 3+ 4-  Hip extension    Hip abduction 3+ 4-  Hip adduction    Hip internal rotation    Hip external rotation    Knee flexion 4-   Knee extension 3+   Ankle dorsiflexion    Ankle plantarflexion    Ankle inversion    Ankle eversion     (Blank rows = not tested)   FUNCTIONAL TESTS:   Eval: Timed up and go (TUG): 24 seconds with SPC  GAIT: Distance walked: 100 feet Assistive device utilized: Single point cane Level of assistance: SBA Comments: mild stiffness with the knee , does not fully straighten out    TODAY'S TREATMENT:  10/05/21  Nustep L 4 LE only 6 min Resisted gait fwd 5x, back 5 x and laterally 3 x each 30#- pain in distal quad fwd with some buckling d/t pain 6 inch step up fwd and laterally RT 15 x each with heavy UE support STS on airex 10 x with focus on TKE- pain in distal quad RT LE on airex and steping fwd and back with left UE support and then laterally stepping off 15 each Knee ext 5# 2 sets 10 HS  curl 20# 2 sets 0 Leg Press 20# 2 sets 10 the SL 10 x VASO RT knee for swelling after session   10/03/21 Nustep level 5 x 6 minutes TUG 16 seconds without device On airex marching and weight shifting for proprioception Leg curls 20# 2x10 LEg extension 5# 2x10 Side stepping on and off airex Leg press 20# 2x10, then right only 2x10 small ROM working on the control and not snapping knee Resisted gait 30# all directions Vaso medium pressure right knee in elevation  09/28/21 Recumbent bike L1.5 x6 min  Passive R knee Ext  S2S 2x10 RLE LAQ 3lb 2x10    RLE HS curls green 2x15   Leg Press 20lb 2x10   Heel raises 2x15   Gait 37f x2 No AD decrease stance time on RLE    Vaso R knee medium 34 deg x10  09/26/21 NuStep L3 x 6 min  RLE quad Sets x10 x5  R knee PROM RLE LAQ 2lb 2x10 RLE HS curls green 2x15   Sit to stands 2x10   Standing March w/ SPC x10    Vaso R knee Low 34 deg x10  Eval NuStep level 4 x 5 minutes Right knee Vaso medium pressure 34 degrees 10 minutes in elevation   PATIENT EDUCATION:  Education details: reviewed his current HEP Person educated: Patient and Child(ren) Education method: ECustomer service managerEducation comprehension: verbalized understanding   HOME EXERCISE PROGRAM: Continuation of what he is  currently doing  ASSESSMENT:  CLINICAL IMPRESSION: focus session on TKE as he is lacking ROM in ext as well as strength. Cuing and UE assist needed with some buckling mostly d/t pain  OBJECTIVE IMPAIRMENTS Abnormal gait, decreased activity tolerance, decreased balance, decreased endurance, decreased mobility, difficulty walking, decreased ROM, decreased strength, increased edema, impaired flexibility, and pain.   REHAB POTENTIAL: Good  CLINICAL DECISION MAKING: Stable/uncomplicated  EVALUATION COMPLEXITY: Low   GOALS: Goals reviewed with patient? Yes  SHORT TERM GOALS: Target date:10/05/21 Independent with initial HEP Goal status: met  LONG TERM GOALS: Target date: 12/14/21  Independent with advanced HEP Goal status: INITIAL  2.  Decrease pain 50% Goal status: on going  3.  Walk with minimal deviation and no device community distances Goal status: progressing  4.  Increase AROMof the right knee to 0-120 degrees flexion Goal status: progressing   5.  Increase strength of the right knee to 4+/5 Goal status: INITIAL  PLAN: PT FREQUENCY: 2x/week  PT DURATION: 12 weeks  PLANNED INTERVENTIONS: Therapeutic exercises, Therapeutic activity, Neuromuscular re-education, Balance training, Gait training, Patient/Family education, Self Care, Joint mobilization, Stair training, Electrical stimulation, Cryotherapy, scar mobilization, Vasopneumatic device, and Manual therapy  PLAN FOR NEXT SESSION: continue to add proprioception and TKE ROM and strength   Zamia Tyminski,ANGIE, PTA 10/05/2021, 7:54 AM [place] [address]Phone: [phone]  Fax:  [phone] Patient Details  Name: BAadarsh CozortMRN: [id number] of Birth: 102/12/1952Referring Provider:  RHaydee Salter MD  Encounter Date: 10/05/2021   PLaqueta Carina PTA 10/05/2021, 7:54 AM  [place] [address]Phone: [phone]  Fax:  [phone]

## 2021-10-09 ENCOUNTER — Other Ambulatory Visit: Payer: Self-pay | Admitting: Physician Assistant

## 2021-10-09 DIAGNOSIS — I739 Peripheral vascular disease, unspecified: Secondary | ICD-10-CM

## 2021-10-09 MED ORDER — ATORVASTATIN CALCIUM 40 MG PO TABS: 40.0000 mg | ORAL_TABLET | Freq: Every day | ORAL | 3 refills | Status: DC

## 2021-10-10 ENCOUNTER — Other Ambulatory Visit: Payer: Self-pay | Admitting: Orthopaedic Surgery

## 2021-10-10 ENCOUNTER — Ambulatory Visit: Payer: Medicare Other | Admitting: Physical Therapy

## 2021-10-10 DIAGNOSIS — M25561 Pain in right knee: Secondary | ICD-10-CM

## 2021-10-10 DIAGNOSIS — R262 Difficulty in walking, not elsewhere classified: Secondary | ICD-10-CM

## 2021-10-10 DIAGNOSIS — R600 Localized edema: Secondary | ICD-10-CM

## 2021-10-10 DIAGNOSIS — M25661 Stiffness of right knee, not elsewhere classified: Secondary | ICD-10-CM

## 2021-10-10 NOTE — Therapy (Signed)
OUTPATIENT PHYSICAL THERAPY LOWER EXTREMITY Treatment   Patient Name: Gerald Carpenter MRN: [id number] DOB:November 09, 1950, 71 y.o., male Today's Date: 10/10/2021   PT End of Session - 10/10/21 1001     Visit Number 6    Number of Visits 20    Date for PT Re-Evaluation 12/22/21    Authorization Type Medicare    PT Start Time 1000    PT Stop Time 1100    PT Time Calculation (min) 60 min             Past Medical History:  Diagnosis Date   Alcoholism ([place])    Arthritis    Cataract    Colon polyps    Complication of anesthesia    HLD (hyperlipidemia)    Nephritis    when pt. was 7 yrs. ols   Peripheral vascular disease (HCC)    PONV (postoperative nausea and vomiting)    "Only when I have aortagram on 06/23/20".   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   Substance abuse [facility])    Past Surgical History:  Procedure Laterality Date   ABDOMINAL AORTOGRAM W/LOWER EXTREMITY N/A 06/23/2020   Procedure: ABDOMINAL AORTOGRAM W/LOWER EXTREMITY;  Surgeon: [name], MD;  Location: [place] [facility];  Service: Cardiovascular;  Laterality: N/A;   ANTERIOR LAT LUMBAR FUSION Right 02/05/2019   Procedure: ATTEMPTED ANTERIOR LATERAL INTERBODY FUSION;  Surgeon: Consuella Lose, MD;  Location: [place];  Service: Neurosurgery;  Laterality: Right;  RIGHT LATERAL TRANSPSOAS DISCECTOMY AND FUSION WITH ROBOTIC ASSISTED PEDICLE SCREW STABILIZATION, LUMBAR 4- LUMBAR 5   CATARACT EXTRACTION W/ INTRAOCULAR LENS  IMPLANT, BILATERAL Bilateral 2013   lens implants for cataracts after Lasik   COLONOSCOPY     ENDOVEIN HARVEST OF GREATER SAPHENOUS VEIN Left 06/29/2020   Procedure: HARVEST OF LEFT GREATER SAPHENOUS VEIN;  Surgeon: [name], MD;  Location: [place];  Service: Vascular;  Laterality: Left;   FEMORAL-POPLITEAL BYPASS GRAFT Left 06/29/2020   Procedure: BYPASS GRAFT LEFT COMMON FEMORAL TO ABOVE KNEE POPLITEAL ARTERY;  Surgeon: [name], MD;  Location: [place];   Service: Vascular;  Laterality: Left;   HEMORRHOID SURGERY N/A 04/06/2021   Procedure: HEMORRHOIDECTOMY 2 COLUMN;  Surgeon: Ileana Roup, MD;  Location: [place];  Service: General;  Laterality: N/A;   KNEE ARTHROSCOPY Right [place] N/A 04/06/2021   Procedure: ANORECTAL EXAM UNDER ANESTHESIA/ excision of anal polyp;  Surgeon: Ileana Roup, MD;  Location: [facility];  Service: General;  Laterality: N/A;   TONSILLECTOMY     at a very young age, but grew back   TOTAL HIP ARTHROPLASTY Right 05/25/2019   Procedure: RIGHT TOTAL HIP ARTHROPLASTY ANTERIOR APPROACH;  Surgeon: Leandrew Koyanagi, MD;  Location: [place];  Service: Orthopedics;  Laterality: Right;   TOTAL KNEE ARTHROPLASTY Right 09/04/2021   Procedure: RIGHT TOTAL KNEE ARTHROPLASTY;  Surgeon: Leandrew Koyanagi, MD;  Location: [place];  Service: Orthopedics;  Laterality: Right;   Patient Active Problem List   Diagnosis Date Noted   Status post total right knee replacement 09/04/2021   Leukocytosis 10/11/2020   PAD (peripheral artery disease) ([place]) 06/29/2020   Prediabetes 04/22/2020   Chronic kidney disease, stage 3b ([place]) 04/15/2020   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 04/12/2020   Lower urinary tract symptoms (LUTS) 04/12/2020   History of colon polyps 04/12/2020   Atherosclerosis of native arteries of extremity with intermittent claudication ([place]) 03/01/2020   Pain  in both feet 07/31/2019   Primary osteoarthritis of right knee 07/07/2019   Status post total replacement of right hip 05/25/2019   Lumbar radiculopathy 02/05/2019    PCP: Gena Fray  REFERRING PROVIDER: Lorelee New DIAG: S/P right TKA  THERAPY DIAG:  Acute pain of right knee  Stiffness of right knee, not elsewhere classified  Difficulty in walking, not elsewhere classified  Localized edema  Rationale for Evaluation and Treatment Rehabilitation  ONSET DATE: 09/04/21  SUBJECTIVE:    SUBJECTIVE STATEMENT: Sleeping better. Very stiff since last session but alos trying to walk more PERTINENT HISTORY: Lumbar fusion 2020, THA 2021  PAIN:  Are you having pain? Yes: NPRS scale: 2/10 Pain location: right knee, calf, leg and foot Pain description: feels heavy, ache Aggravating factors: touching the calf, stretching the back of the leg pain up to 10/10 Relieving factors: rest, pain meds, ice pain can be a 1/10  PRECAUTIONS: None  WEIGHT BEARING RESTRICTIONS No  FALLS:  Has patient fallen in last 6 months? No  LIVING ENVIRONMENT: Lives with: lives with their family Lives in: House/apartment Stairs: Yes: Internal: 16 steps; can reach both one at a time Has following equipment at home: Single point cane and Walker - 2 wheeled  OCCUPATION: retired  PLOF: Independent  PATIENT GOALS work some, no pain, walk without difficulty   OBJECTIVE:  EDEMA:  Circumferential: left mid patella 37 cm, right 40.5 cm  MUSCLE LENGTH: Very tight calf and HS  POSTURE: rounded shoulders and forward head  PALPATION: Edematous right knee, mild tender, very sore calf but reports that it is much better since Tuesday  LOWER EXTREMITY ROM:  Active Passive ROM Right AROM 09/21/21 Right  PROM 09/21/21 Right  AROM 10/03/21  Hip flexion     Hip extension     Hip abduction     Hip adduction     Hip internal rotation     Hip external rotation     Knee flexion 107 112 115  Knee extension 35 12 20  Ankle dorsiflexion     Ankle plantarflexion     Ankle inversion     Ankle eversion      (Blank rows = not tested)  LOWER EXTREMITY MMT:  MMT Right eval Left eval  Hip flexion 3+ 4-  Hip extension    Hip abduction 3+ 4-  Hip adduction    Hip internal rotation    Hip external rotation    Knee flexion 4-   Knee extension 3+   Ankle dorsiflexion    Ankle plantarflexion    Ankle inversion    Ankle eversion     (Blank rows = not tested)   FUNCTIONAL TESTS:  Eval: Timed  up and go (TUG): 24 seconds with SPC  GAIT: Distance walked: 100 feet Assistive device utilized: Single point cane Level of assistance: SBA Comments: mild stiffness with the knee , does not fully straighten out    TODAY'S TREATMENT:  10/10/21  Nustep L5 LE only 6 min Bike L3 5 min [place] 1 blue band flex and ext 2 sets 10 Leg Press 30# 2 sets 10, RT knee 20# 10 x ( Seat #10) Knee ext 5# 10x  up with 2 down with RT. BIL 10x HS curl 25# 2 sets 10 Joint mobs and jt capsule stretch to help with tightness/pain VASO RT knee 10 min    10/05/21  Nustep L 4 LE only 6 min Resisted gait fwd 5x, back 5 x and laterally 3 x  each 30#- pain in distal quad fwd with some buckling d/t pain 6 inch step up fwd and laterally RT 15 x each with heavy UE support STS on airex 10 x with focus on TKE- pain in distal quad RT LE on airex and steping fwd and back with left UE support and then laterally stepping off 15 each Knee ext 5# 2 sets 10 HS curl 20# 2 sets 0 Leg Press 20# 2 sets 10 the SL 10 x VASO RT knee for swelling after session   10/03/21 Nustep level 5 x 6 minutes TUG 16 seconds without device On airex marching and weight shifting for proprioception Leg curls 20# 2x10 LEg extension 5# 2x10 Side stepping on and off airex Leg press 20# 2x10, then right only 2x10 small ROM working on the control and not snapping knee Resisted gait 30# all directions Vaso medium pressure right knee in elevation  09/28/21 Recumbent bike L1.5 x6 min  Passive R knee Ext  S2S 2x10 RLE LAQ 3lb 2x10    RLE HS curls green 2x15   Leg Press 20lb 2x10   Heel raises 2x15   Gait 71f x2 No AD decrease stance time on RLE    Vaso R knee medium 34 deg x10  09/26/21 NuStep L3 x 6 min  RLE quad Sets x10 x5  R knee PROM RLE LAQ 2lb 2x10 RLE HS curls green 2x15   Sit to stands 2x10   Standing March w/ SPC x10    Vaso R knee Low 34 deg x10  Eval NuStep level 4 x 5 minutes Right knee Vaso medium pressure 34  degrees 10 minutes in elevation   PATIENT EDUCATION:  Education details: reviewed his current HEP Person educated: Patient and Child(ren) Education method: ECustomer service managerEducation comprehension: verbalized understanding   HOME EXERCISE PROGRAM: Continuation of what he is currently doing  ASSESSMENT:  CLINICAL IMPRESSION: pt continues to have good ROM. Verb sleeping better. Still co stiffness so added mobs and jt capsule stretching pt still with weakness in knee ext> flex and trouble with initial push off/lift with ext and pain.    OBJECTIVE IMPAIRMENTS Abnormal gait, decreased activity tolerance, decreased balance, decreased endurance, decreased mobility, difficulty walking, decreased ROM, decreased strength, increased edema, impaired flexibility, and pain.   REHAB POTENTIAL: Good  CLINICAL DECISION MAKING: Stable/uncomplicated  EVALUATION COMPLEXITY: Low   GOALS: Goals reviewed with patient? Yes  SHORT TERM GOALS: Target date:10/05/21 Independent with initial HEP Goal status: met  LONG TERM GOALS: Target date: 12/14/21  Independent with advanced HEP Goal status: INITIAL  2.  Decrease pain 50% Goal status: on going  3.  Walk with minimal deviation and no device community distances Goal status: progressing  4.  Increase AROMof the right knee to 0-120 degrees flexion Goal status: progressing   5.  Increase strength of the right knee to 4+/5 Goal status: INITIAL  PLAN: PT FREQUENCY: 2x/week  PT DURATION: 12 weeks  PLANNED INTERVENTIONS: Therapeutic exercises, Therapeutic activity, Neuromuscular re-education, Balance training, Gait training, Patient/Family education, Self Care, Joint mobilization, Stair training, Electrical stimulation, Cryotherapy, scar mobilization, Vasopneumatic device, and Manual therapy  PLAN FOR NEXT SESSION: continue to add proprioception and TKE ROM and strength   Lavontae Cornia,ANGIE, PTA 10/10/2021, 10:34 AM [place] [address]Phone: [phone]  Fax:  [phone] Patient Details  Name: BVerbon GiangregorioMRN: [id number] of Birth: 121-May-1952Referring Provider:  RHaydee Salter  MD  Encounter Date: 10/10/2021   Vickii Penna 10/10/2021, 10:34 AM  [place]. [place], [place], [address] Phone: [phone]   Fax:  [place]. [place], [place], [address] Phone: [phone]   Fax:  [phone]  Patient Details  Name: Sedric Guia MRN: [id number] Date of Birth: December 23, 1950 Referring Provider:  Haydee Salter, MD  Encounter Date: 10/10/2021   Laqueta Carina, PTA 10/10/2021, 10:34 AM  [place]. [place], [place], [address] Phone: [phone]   Fax:  [phone]

## 2021-10-11 ENCOUNTER — Ambulatory Visit: Payer: Medicare Other | Admitting: Family Medicine

## 2021-10-12 ENCOUNTER — Ambulatory Visit: Payer: Medicare Other | Admitting: Physical Therapy

## 2021-10-12 ENCOUNTER — Encounter: Payer: Self-pay | Admitting: Physical Therapy

## 2021-10-12 DIAGNOSIS — M25661 Stiffness of right knee, not elsewhere classified: Secondary | ICD-10-CM

## 2021-10-12 DIAGNOSIS — R262 Difficulty in walking, not elsewhere classified: Secondary | ICD-10-CM

## 2021-10-12 DIAGNOSIS — M6281 Muscle weakness (generalized): Secondary | ICD-10-CM

## 2021-10-12 DIAGNOSIS — R600 Localized edema: Secondary | ICD-10-CM

## 2021-10-12 DIAGNOSIS — M25561 Pain in right knee: Secondary | ICD-10-CM | POA: Diagnosis not present

## 2021-10-12 NOTE — Progress Notes (Signed)
Initial neurology clinic note  Gerald Carpenter MRN: [id number] DOB: 1950/08/19  Referring provider: Haydee Salter, MD  Primary care provider: Haydee Salter, MD  Reason for consult:  pain in bilateral feet  Subjective:  This is Mr. Gerald Carpenter, a 71 y.o. right-handed male with a medical history of lumbar radiculopathy s/p fusion at L4-5 (2020), PAD s/p bypass graft of left common femoral to above knee popliteal artery, prediabetes, CKD3, HLD, tobacco use, OA s/p right total hip and right knee arthroplasty who presents to neurology clinic with pain in both feet (right > left). The patient is alone today.  Patient has had pain in bilateral feet for about 6 years. He had right sciatic pain at that time and first noticed pain in the right foot. As time went on, he noticed pain in both feet. He mentions that in when he lived in [place], no one would touch him. When he moved here and had a lumbar fusion (L4-5) in 2020. After the surgery, his sciatica seemed to resolve. He was have pain in the hip area and then had a partial replacement of the right hip which resolved the pain in the hip. The pain in his feet never resolved. He then had his right knee replaced about 6 weeks ago (middle of 08/2021). He has previously seen podiatry (x3) who felt he may have nerve damage in his feet. He has also been seen by vascular surgery. He saw Dr. Sharol Given in ortho who diagnosed achilles tendon contracture. He was given exercises and new shoes which did not help. He has had local injections that did not help. He had pads put in his shoes that also did not help. He has never been on medications for nerve pain.  He describes the pain as a little, throbbing, annoying pain. When he is not walking, he has no pain. When he starts walking, he has extreme pain in his feet just proximal to the toes. He rates the pain as 8/10. When he stops walking, his pain immediately stops. Patient walks with a cane currently because of  his recent right knee surgery. He leans on a shopping cart when shopping that may give some relief to his feet. He denies back pain though. He denies any bowel or bladder changes. He denies saddle anesthesia.  Patient saw PCP on 07/05/21 mentioning pain and decreased sensation in bilateral feet. Per documentation, Dr. Gena Fray was considering "Morton's neuroma, metatarsalgia, and peripheral neuropathy as part of the differential." Due to concerns for peripheral neuropathy, patient was referred to neurology.  The patient has not noticed any recent skin rashes nor does he report any constitutional symptoms like fever, night sweats, anorexia or unintentional weight loss.  EtOH use: None  Restrictive diet? No Family history of neuropathy/myopathy/NM disease? No   MEDICATIONS:  Outpatient Encounter Medications as of 10/17/2021  Medication Sig Note   aspirin EC 81 MG tablet Take 1 tablet (81 mg total) by mouth 2 (two) times daily. To be taken after surgery to prevent blood clots.  Swallow whole.    atorvastatin (LIPITOR) 40 MG tablet Take 1 tablet (40 mg total) by mouth daily.    tamsulosin (FLOMAX) 0.4 MG CAPS capsule TAKE 1 CAPSULE(0.4 MG) BY MOUTH IN THE MORNING    Wheat Dextrin-Vit B6-B12-FA (BENEFIBER PLUS HEART HEALTH PO) Take 2 Scoops by mouth daily.    docusate sodium (COLACE) 100 MG capsule Take 1 capsule (100 mg total) by mouth daily as needed. (Patient not taking: Reported on  10/17/2021) 09/04/2021: Has not started   HYDROcodone-acetaminophen (NORCO) 5-325 MG tablet Take 1 tablet by mouth 3 (three) times daily as needed. (Patient not taking: Reported on 10/17/2021)    HYDROcodone-acetaminophen (NORCO) 5-325 MG tablet Take 1-2 tablets by mouth daily as needed. (Patient not taking: Reported on 10/17/2021)    methocarbamol (ROBAXIN-750) 750 MG tablet Take 1 tablet (750 mg total) by mouth 2 (two) times daily as needed for muscle spasms. To be taken after surgery (Patient not taking: Reported on  10/17/2021) 09/04/2021: Has not started   ondansetron (ZOFRAN) 4 MG tablet Take 1 tablet (4 mg total) by mouth every 8 (eight) hours as needed for nausea or vomiting. (Patient not taking: Reported on 10/17/2021) 09/04/2021: Has not started   oxyCODONE-acetaminophen (PERCOCET) 5-325 MG tablet Take 1-2 tablets by mouth every 6 (six) hours as needed. To be taken after surgery (Patient not taking: Reported on 10/17/2021)    tiZANidine (ZANAFLEX) 4 MG tablet Take 1 tablet (4 mg total) by mouth every 6 (six) hours as needed for muscle spasms. (Patient not taking: Reported on 10/17/2021)    No facility-administered encounter medications on file as of 10/17/2021.    PAST MEDICAL HISTORY: Past Medical History:  Diagnosis Date   Alcoholism ([employer])    Arthritis    Cataract    Colon polyps    Complication of anesthesia    HLD (hyperlipidemia)    Nephritis    when pt. was 7 yrs. ols   Peripheral vascular disease ([facility])    PONV (postoperative nausea and vomiting)    "Only when I have aortagram on 06/23/20".   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   Substance abuse ([place])     PAST SURGICAL HISTORY: Past Surgical History:  Procedure Laterality Date   ABDOMINAL AORTOGRAM W/LOWER EXTREMITY N/A 06/23/2020   Procedure: ABDOMINAL AORTOGRAM W/LOWER EXTREMITY;  Surgeon: [name], MD;  Location: [facility];  Service: Cardiovascular;  Laterality: N/A;   ANTERIOR LAT LUMBAR FUSION Right 02/05/2019   Procedure: ATTEMPTED ANTERIOR LATERAL INTERBODY FUSION;  Surgeon: Consuella Lose, MD;  Location: [facility];  Service: Neurosurgery;  Laterality: Right;  RIGHT LATERAL TRANSPSOAS DISCECTOMY AND FUSION WITH ROBOTIC ASSISTED PEDICLE SCREW STABILIZATION, LUMBAR 4- LUMBAR 5   CATARACT EXTRACTION W/ INTRAOCULAR LENS  IMPLANT, BILATERAL Bilateral 2013   lens implants for cataracts after Lasik   COLONOSCOPY     ENDOVEIN HARVEST OF GREATER SAPHENOUS VEIN Left 06/29/2020   Procedure: HARVEST OF LEFT  GREATER SAPHENOUS VEIN;  Surgeon: [name], MD;  Location: [facility];  Service: Vascular;  Laterality: Left;   FEMORAL-POPLITEAL BYPASS GRAFT Left 06/29/2020   Procedure: BYPASS GRAFT LEFT COMMON FEMORAL TO ABOVE KNEE POPLITEAL ARTERY;  Surgeon: [name], MD;  Location: [facility];  Service: Vascular;  Laterality: Left;   HEMORRHOID SURGERY N/A 04/06/2021   Procedure: HEMORRHOIDECTOMY 2 COLUMN;  Surgeon: Ileana Roup, MD;  Location: [facility];  Service: General;  Laterality: N/A;   KNEE ARTHROSCOPY Right [place] N/A 04/06/2021   Procedure: ANORECTAL EXAM UNDER ANESTHESIA/ excision of anal polyp;  Surgeon: Ileana Roup, MD;  Location: [facility];  Service: General;  Laterality: N/A;   TONSILLECTOMY     at a very young age, but grew back   TOTAL HIP ARTHROPLASTY Right 05/25/2019   Procedure: RIGHT TOTAL HIP ARTHROPLASTY ANTERIOR APPROACH;  Surgeon: Leandrew Koyanagi, MD;  Location: [facility];  Service: Orthopedics;  Laterality:  Right;   TOTAL KNEE ARTHROPLASTY Right 09/04/2021   Procedure: RIGHT TOTAL KNEE ARTHROPLASTY;  Surgeon: Leandrew Koyanagi, MD;  Location: [facility];  Service: Orthopedics;  Laterality: Right;    ALLERGIES: Allergies  Allergen Reactions   Anesthetics, Amide Nausea And Vomiting    FAMILY HISTORY: Family History  Problem Relation Age of Onset   Pulmonary fibrosis Mother    Breast cancer Mother    Hyperlipidemia Mother    Hypertension Mother    Other Mother        Scarlet Fever   Heart disease Father    Hyperlipidemia Father    Diabetes Brother    Hypertension Brother    Breast cancer Maternal Aunt    Prostate cancer Paternal Grandfather    Hypothyroidism Daughter    Colon cancer Neg Hx    Esophageal cancer Neg Hx    Ulcerative colitis Neg Hx    Uterine cancer Neg Hx     SOCIAL HISTORY: Social History   Tobacco Use   Smoking status: Every Day    Packs/day: 0.25    Years: 53.00     Total pack years: 13.25    Types: Cigarettes   Smokeless tobacco: Never   Tobacco comments:    had first cigarettes age  19, habit started at age 89/today he 97 cig a day  Vaping Use   Vaping Use: Never used  Substance Use Topics   Alcohol use: Not Currently    Comment: 05/10/1991   Drug use: Not Currently    Comment: prior usage of "speed"  when drinking   Social History   Social History Narrative   Recently moved here from [place], [place]   Has total of 4 children 1 adopted   Right handed   Caffeine 3 cup a day    Live in 2 story home    Objective:  Vital Signs:  BP 125/72   Pulse 83   Ht '5\' 7"'$  (1.702 m)   Wt 159 lb (72.1 kg)   SpO2 97%   BMI 24.90 kg/m   General: General appearance: Awake and alert. No distress. Cooperative with exam.  Skin: No obvious rash or jaundice. HEENT: Atraumatic. Anicteric. Lungs: Non-labored breathing on room air  Psych: Affect appropriate.  Neurological: Mental Status: Alert. Speech fluent. No pseudobulbar affect Cranial Nerves: CNII: No RAPD. Visual fields intact. CNIII, IV, VI: PERRL. No nystagmus. EOMI. CN V: Facial sensation intact bilaterally to fine touch. [place] clench strong. CN VII: Facial muscles symmetric and strong. No ptosis at rest. CN VIII: Hears finger rub well bilaterally. CN IX: No hypophonia. CN X: Palate elevates symmetrically. CN XI: Full strength shoulder shrug bilaterally. CN XII: Tongue protrusion full and midline. No atrophy or fasciculations. No significant dysarthria Motor: Tone is normal. No fasciculations in extremities. No atrophy.  Individual muscle group testing (MRC grade out of 5):  Movement     Neck flexion 5    Neck extension 5     Right Left   Shoulder abduction 5 5   Shoulder adduction 5 5   Shoulder ext rotation 5 5   Shoulder int rotation 5 5   Elbow flexion 5 5   Elbow extension 5 5   Wrist extension 5 5   Wrist flexion 5 5   Finger abduction - FDI 5 5   Finger  abduction - ADM 5 5   Finger extension 5 5   Finger distal flexion - 2/'3 5 5   '$ Finger distal flexion -  4/'5 5 5   '$ Thumb flexion - FPL 5 5   Thumb abduction - APB 5 5    Right leg testing is limited by pain in knee from recent surgery Hip flexion 5- 5   Hip adduction 5 5   Hip abduction 5 5   Knee extension 5- 5   Knee flexion 5- 5   Dorsiflexion 5 5   Plantarflexion 5 5   Inversion 5 5   Eversion 5 5   Great toe extension 5 5   Great toe flexion 5 5     Reflexes:  Right Left   Bicep 2+ 2+   Tricep 2+ 2+   BrRad 2+ 2+   Knee 0 2+ Right knee replaced; cross adductor on left  Ankle 0 0    Pathological Reflexes: Babinski: flexor response bilaterally Hoffman: absent bilaterally Troemner: absent bilaterally  Sensation: Pinprick: Intact in all extremities Vibration: Intact in upper extremities. ?Diminished in lower extremities: 15 seconds in right great toe, 11 seconds in left great toe Proprioception: Intact in bilateral great toes Coordination: Intact finger-to- nose-finger bilaterally. Romberg negative. Gait: Able to rise from chair with arms crossed unassisted. Normal, narrow-based gait. Able to walk on toes and heels.  Labs and Imaging review: Out-side paper records, electronic medical record, and images have been reviewed where available and summarized as:  Lab Results  Component Value Date   HGBA1C 5.8 04/21/2020   Lab Results  Component Value Date   VITAMINB12 1,189 (H) 07/05/2021   Lab Results  Component Value Date   TSH 2.29 07/05/2021   No results found for: "ESRSEDRATE", "POCTSEDRATE"  Component     Latest Ref Rng 08/30/2021 09/05/2021  Sodium     135 - 145 mmol/L 140    Potassium     3.5 - 5.1 mmol/L 4.0    Chloride     98 - 111 mmol/L 108    CO2     22 - 32 mmol/L 24    Glucose     70 - 99 mg/dL 101 (H)    BUN     8 - 23 mg/dL 10    Creatinine     0.61 - 1.24 mg/dL 1.33 (H)    Calcium     8.9 - 10.3 mg/dL 9.0    Total Protein     6.5 -  8.1 g/dL 7.1    Albumin     3.5 - 5.0 g/dL 3.7    AST     15 - 41 U/L 18    ALT     0 - 44 U/L 27    Alkaline Phosphatase     38 - 126 U/L 55    Total Bilirubin     0.3 - 1.2 mg/dL 0.7    GFR, Estimated     >60 mL/min 58 (L)    Anion gap     5 - 15  8    WBC     4.0 - 10.5 K/uL  23.7 (H)   RBC     4.22 - 5.81 MIL/uL  4.49   Hemoglobin     13.0 - 17.0 g/dL  14.3   HCT     39.0 - 52.0 %  42.1   MCV     80.0 - 100.0 fL  93.8   MCH     26.0 - 34.0 pg  31.8   MCHC     30.0 - 36.0 g/dL  34.0   RDW  11.5 - 15.5 %  14.6   Platelets     150 - 400 K/uL  159   nRBC     0.0 - 0.2 %  0.0     Legend: (H) High (L) Low  CT lumbar spine wo contrast (02/05/2019): FINDINGS: Segmentation: 5 lumbar type vertebrae.   Alignment: Normal.   Vertebrae: No acute fracture or focal pathologic process.   Paraspinal and other soft tissues: Negative. Abdominal aortic atherosclerosis.   Disc levels:   Disc spaces: Degenerative disease with disc height loss at L4-5.   T12-L1: Minimal broad-based disc bulge. Mild bilateral facet arthropathy. No foraminal or central canal stenosis.   L1-L2: Broad-based disc bulge. Mild bilateral facet arthropathy. No evidence of neural foraminal stenosis. No central canal stenosis.   L2-L3: Broad-based disc bulge. Mild bilateral facet arthropathy. Mild spinal stenosis. No evidence of neural foraminal stenosis.   L3-L4: Broad-based disc bulge. Mild bilateral facet arthropathy. Moderate spinal stenosis. No evidence of neural foraminal stenosis.   L4-L5: Broad-based disc osteophyte complex. Moderate left and mild right facet arthropathy. Bilateral subarticular recess stenosis. Moderate spinal stenosis. Moderate bilateral foraminal stenosis.   L5-S1: Mild broad-based disc bulge. Mild bilateral foraminal narrowing. No central canal stenosis.   IMPRESSION: 1. Lumbar spine spondylosis as described above most severe at L4-5.  Assessment/Plan:   Donna Silverman is a 71 y.o. male who presents for evaluation of bilateral foot pain. He has a relevant medical history of lumbar radiculopathy s/p fusion at L4-5 (2020), PAD s/p bypass graft of left common femoral to above knee popliteal artery, prediabetes, CKD3, HLD, tobacco use, OA s/p right total hip and right knee arthroplasty. His neurological examination shows no focal weakness or clear sensory changes. The pain in his feet are limited to the balls of his feet and only when standing on them, which would not be consistent with peripheral neuropathy. An EMG could confirm, but even if a mild neuropathy was seen, would not explain patient's focal pain. EMG would not be helpful for compressive neuropathy that distal. Metatarsalgia seems most likely as the cause of his symptoms.   PLAN: -MRI of right foot to evaluate for local lesion to explain symptoms -May consider EMG, though this is likely to be of low yield to explain patient's symptoms -Continue PT to strengthen right leg after knee surgery  -Return to clinic to be determined  The impression above as well as the plan as outlined below were extensively discussed with the patient who voiced understanding. All questions were answered to their satisfaction.  When available, results of the above investigations and possible further recommendations will be communicated to the patient via telephone/MyChart. Patient to call office if not contacted after expected testing turnaround time.   Total time spent reviewing records, interview, history/exam, documentation, and coordination of care on day of encounter:  60 min   Thank you for allowing me to participate in patient's care.  If I can answer any additional questions, I would be pleased to do so.  Kai Levins, MD   CC: Gena Fray Lillette Boxer, MD [address] [place] [address]  CC: Referring provider: Haydee Salter, [place],  [place] [address]

## 2021-10-12 NOTE — Therapy (Signed)
OUTPATIENT PHYSICAL THERAPY LOWER EXTREMITY Treatment   Patient Name: Gerald Carpenter MRN: [id number] DOB:[birth year], 71 y.o., male Today's Date: 10/12/2021   PT End of Session - 10/12/21 0755     Visit Number 7    Date for PT Re-Evaluation 12/22/21    PT Start Time 0755    PT Stop Time 0850    PT Time Calculation (min) 55 min    Activity Tolerance Patient tolerated treatment well    Behavior During Therapy [facility] for tasks assessed/performed             Past Medical History:  Diagnosis Date   Alcoholism ([place])    Arthritis    Cataract    Colon polyps    Complication of anesthesia    HLD (hyperlipidemia)    Nephritis    when pt. was 7 yrs. ols   Peripheral vascular disease (HCC)    PONV (postoperative nausea and vomiting)    "Only when I have aortagram on 06/23/20".   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   Substance abuse [facility])    Past Surgical History:  Procedure Laterality Date   ABDOMINAL AORTOGRAM W/LOWER EXTREMITY N/A 06/23/2020   Procedure: ABDOMINAL AORTOGRAM W/LOWER EXTREMITY;  Surgeon: [name], MD;  Location: [place] [facility];  Service: Cardiovascular;  Laterality: N/A;   ANTERIOR LAT LUMBAR FUSION Right 02/05/2019   Procedure: ATTEMPTED ANTERIOR LATERAL INTERBODY FUSION;  Surgeon: Consuella Lose, MD;  Location: [place];  Service: Neurosurgery;  Laterality: Right;  RIGHT LATERAL TRANSPSOAS DISCECTOMY AND FUSION WITH ROBOTIC ASSISTED PEDICLE SCREW STABILIZATION, LUMBAR 4- LUMBAR 5   CATARACT EXTRACTION W/ INTRAOCULAR LENS  IMPLANT, BILATERAL Bilateral 2013   lens implants for cataracts after Lasik   COLONOSCOPY     ENDOVEIN HARVEST OF GREATER SAPHENOUS VEIN Left 06/29/2020   Procedure: HARVEST OF LEFT GREATER SAPHENOUS VEIN;  Surgeon: [name], MD;  Location: [place];  Service: Vascular;  Laterality: Left;   FEMORAL-POPLITEAL BYPASS GRAFT Left 06/29/2020   Procedure: BYPASS GRAFT LEFT COMMON FEMORAL TO ABOVE KNEE POPLITEAL  ARTERY;  Surgeon: [name], MD;  Location: [place];  Service: Vascular;  Laterality: Left;   HEMORRHOID SURGERY N/A 04/06/2021   Procedure: HEMORRHOIDECTOMY 2 COLUMN;  Surgeon: Ileana Roup, MD;  Location: [place];  Service: General;  Laterality: N/A;   KNEE ARTHROSCOPY Right [place] N/A 04/06/2021   Procedure: ANORECTAL EXAM UNDER ANESTHESIA/ excision of anal polyp;  Surgeon: Ileana Roup, MD;  Location: [facility];  Service: General;  Laterality: N/A;   TONSILLECTOMY     at a very young age, but grew back   TOTAL HIP ARTHROPLASTY Right 05/25/2019   Procedure: RIGHT TOTAL HIP ARTHROPLASTY ANTERIOR APPROACH;  Surgeon: Leandrew Koyanagi, MD;  Location: [place];  Service: Orthopedics;  Laterality: Right;   TOTAL KNEE ARTHROPLASTY Right 09/04/2021   Procedure: RIGHT TOTAL KNEE ARTHROPLASTY;  Surgeon: Leandrew Koyanagi, MD;  Location: [place];  Service: Orthopedics;  Laterality: Right;   Patient Active Problem List   Diagnosis Date Noted   Status post total right knee replacement 09/04/2021   Leukocytosis 10/11/2020   PAD (peripheral artery disease) ([place]) 06/29/2020   Prediabetes 04/22/2020   Chronic kidney disease, stage 3b ([place]) 04/15/2020   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 04/12/2020   Lower urinary tract symptoms (LUTS) 04/12/2020   History of colon polyps 04/12/2020   Atherosclerosis of native arteries of extremity with intermittent  claudication ([place]) 03/01/2020   Pain in both feet 07/31/2019   Primary osteoarthritis of right knee 07/07/2019   Status post total replacement of right hip 05/25/2019   Lumbar radiculopathy 02/05/2019    PCP: Gena Fray  REFERRING PROVIDER: Lorelee New DIAG: S/P right TKA  THERAPY DIAG:  Acute pain of right knee  Stiffness of right knee, not elsewhere classified  Difficulty in walking, not elsewhere classified  Localized edema  Muscle weakness  (generalized)  Rationale for Evaluation and Treatment Rehabilitation  ONSET DATE: 09/04/21  SUBJECTIVE:   SUBJECTIVE STATEMENT: "Just stiff"  PERTINENT HISTORY: Lumbar fusion 2020, THA 2021  PAIN:  Are you having pain? Yes: NPRS scale: 0/10 Pain location: right knee, calf, leg and foot Pain description: feels heavy, ache Aggravating factors: touching the calf, stretching the back of the leg pain up to 10/10 Relieving factors: rest, pain meds, ice pain can be a 1/10  PRECAUTIONS: None  WEIGHT BEARING RESTRICTIONS No  FALLS:  Has patient fallen in last 6 months? No  LIVING ENVIRONMENT: Lives with: lives with their family Lives in: House/apartment Stairs: Yes: Internal: 16 steps; can reach both one at a time Has following equipment at home: Single point cane and Walker - 2 wheeled  OCCUPATION: retired  PLOF: Independent  PATIENT GOALS work some, no pain, walk without difficulty   OBJECTIVE:  EDEMA:  Circumferential: left mid patella 37 cm, right 40.5 cm  MUSCLE LENGTH: Very tight calf and HS  POSTURE: rounded shoulders and forward head  PALPATION: Edematous right knee, mild tender, very sore calf but reports that it is much better since Tuesday  LOWER EXTREMITY ROM:  Active Passive ROM Right AROM 09/21/21 Right  PROM 09/21/21 Right  AROM 10/03/21  Hip flexion     Hip extension     Hip abduction     Hip adduction     Hip internal rotation     Hip external rotation     Knee flexion 107 112 115  Knee extension 35 12 20  Ankle dorsiflexion     Ankle plantarflexion     Ankle inversion     Ankle eversion      (Blank rows = not tested)  LOWER EXTREMITY MMT:  MMT Right eval Left eval  Hip flexion 3+ 4-  Hip extension    Hip abduction 3+ 4-  Hip adduction    Hip internal rotation    Hip external rotation    Knee flexion 4-   Knee extension 3+   Ankle dorsiflexion    Ankle plantarflexion    Ankle inversion    Ankle eversion     (Blank rows =  not tested)   FUNCTIONAL TESTS:  Eval: Timed up and go (TUG): 24 seconds with SPC  GAIT: Distance walked: 100 feet Assistive device utilized: Single point cane Level of assistance: SBA Comments: mild stiffness with the knee , does not fully straighten out    TODAY'S TREATMENT: 10/12/21 Recumbent bike L2.7 x 6 min  Step ups 4in x 10 each HHA x2 Hamstring curls 25lb 2x10, LLE 15lb 2x10 Leg extensions 5lb 2x10, 5lb RLE eccentrics 2x5  Leg press 30lb 2x10, RLE 20lb 2x5 S2S holding yellow ball 2x10  Resisted gaot 30lb 4 way x3 each Vaso R knee low 34deg x10 min  10/10/21  Nustep L5 LE only 6 min Bike L3 5 min Fitter 1 blue band flex and ext 2 sets 10 Leg Press 30# 2 sets 10, RT knee 20# 10 x (  Seat #10) Knee ext 5# 10x  up with 2 down with RT. BIL 10x HS curl 25# 2 sets 10 Joint mobs and jt capsule stretch to help with tightness/pain VASO RT knee 10 min    10/05/21  Nustep L 4 LE only 6 min Resisted gait fwd 5x, back 5 x and laterally 3 x each 30#- pain in distal quad fwd with some buckling d/t pain 6 inch step up fwd and laterally RT 15 x each with heavy UE support STS on airex 10 x with focus on TKE- pain in distal quad RT LE on airex and steping fwd and back with left UE support and then laterally stepping off 15 each Knee ext 5# 2 sets 10 HS curl 20# 2 sets 0 Leg Press 20# 2 sets 10 the SL 10 x VASO RT knee for swelling after session   10/03/21 Nustep level 5 x 6 minutes TUG 16 seconds without device On airex marching and weight shifting for proprioception Leg curls 20# 2x10 LEg extension 5# 2x10 Side stepping on and off airex Leg press 20# 2x10, then right only 2x10 small ROM working on the control and not snapping knee Resisted gait 30# all directions Vaso medium pressure right knee in elevation  09/28/21 Recumbent bike L1.5 x6 min  Passive R knee Ext  S2S 2x10 RLE LAQ 3lb 2x10    RLE HS curls green 2x15   Leg Press 20lb 2x10   Heel raises 2x15   Gait  82f x2 No AD decrease stance time on RLE    Vaso R knee medium 34 deg x10  09/26/21 NuStep L3 x 6 min  RLE quad Sets x10 x5  R knee PROM RLE LAQ 2lb 2x10 RLE HS curls green 2x15   Sit to stands 2x10   Standing March w/ SPC x10    Vaso R knee Low 34 deg x10  Eval NuStep level 4 x 5 minutes Right knee Vaso medium pressure 34 degrees 10 minutes in elevation   PATIENT EDUCATION:  Education details: reviewed his current HEP Person educated: Patient and Child(ren) Education method: ECustomer service managerEducation comprehension: verbalized understanding   HOME EXERCISE PROGRAM: Continuation of what he is currently doing  ASSESSMENT:  CLINICAL IMPRESSION: pt continues to have good ROM. Pt stated he had a hard time sleeping last night. RLE weakness present with all strengthening interventions. Unable to complete step ups without UE assist. Cue to allow RLE to bend with resisted gait.    OBJECTIVE IMPAIRMENTS Abnormal gait, decreased activity tolerance, decreased balance, decreased endurance, decreased mobility, difficulty walking, decreased ROM, decreased strength, increased edema, impaired flexibility, and pain.   REHAB POTENTIAL: Good  CLINICAL DECISION MAKING: Stable/uncomplicated  EVALUATION COMPLEXITY: Low   GOALS: Goals reviewed with patient? Yes  SHORT TERM GOALS: Target date:10/05/21 Independent with initial HEP Goal status: met  LONG TERM GOALS: Target date: 12/14/21  Independent with advanced HEP Goal status: INITIAL  2.  Decrease pain 50% Goal status: on going  3.  Walk with minimal deviation and no device community distances Goal status: progressing  4.  Increase AROMof the right knee to 0-120 degrees flexion Goal status: progressing   5.  Increase strength of the right knee to 4+/5 Goal status: INITIAL  PLAN: PT FREQUENCY: 2x/week  PT DURATION: 12 weeks  PLANNED INTERVENTIONS: Therapeutic exercises, Therapeutic activity, Neuromuscular  re-education, Balance training, Gait training, Patient/Family education, Self Care, Joint mobilization, Stair training, Electrical stimulation, Cryotherapy, scar mobilization, Vasopneumatic device, and Manual therapy  PLAN FOR NEXT SESSION: continue to add proprioception and TKE ROM and strength   [name], PTA 10/12/2021, 7:55 AM [place]. [place], [place], [address] Phone: [phone]   Fax:  [phone]  Patient Details  Name: Gerald Carpenter MRN: [id number] Date of Birth: 06-10-1950 Referring Provider:  Haydee Salter, MD  Encounter Date: 10/12/2021   [name], PTA 10/12/2021, 7:55 AM  [place] [place], [place], [address] Phone: [phone]   Fax:  [place]. [place], [place], [address] Phone: [phone]   Fax:  [phone]  Patient Details  Name: Gerald Carpenter MRN: [id number] Date of Birth: 1950-11-10 Referring Provider:  Haydee Salter, MD  Encounter Date: 10/12/2021   [name], PTA 10/12/2021, 7:55 AM  [place]. [place], [place], [address] Phone: [phone]   Fax:  [phone]

## 2021-10-14 ENCOUNTER — Other Ambulatory Visit: Payer: Self-pay | Admitting: Orthopaedic Surgery

## 2021-10-16 ENCOUNTER — Ambulatory Visit: Payer: Medicare Other | Admitting: Physical Therapy

## 2021-10-16 ENCOUNTER — Encounter: Payer: Self-pay | Admitting: Physical Therapy

## 2021-10-16 DIAGNOSIS — M25661 Stiffness of right knee, not elsewhere classified: Secondary | ICD-10-CM

## 2021-10-16 DIAGNOSIS — M25561 Pain in right knee: Secondary | ICD-10-CM | POA: Diagnosis not present

## 2021-10-16 DIAGNOSIS — R262 Difficulty in walking, not elsewhere classified: Secondary | ICD-10-CM

## 2021-10-16 DIAGNOSIS — R600 Localized edema: Secondary | ICD-10-CM

## 2021-10-16 NOTE — Therapy (Signed)
OUTPATIENT PHYSICAL THERAPY LOWER EXTREMITY Treatment   Patient Name: Gerald Carpenter MRN: [id number] DOB:22-Oct-1950, 71 y.o., male Today's Date: 10/16/2021   PT End of Session - 10/16/21 0848     Visit Number 8    Number of Visits 20    Date for PT Re-Evaluation 12/22/21    PT Start Time 0845    PT Stop Time 0940    PT Time Calculation (min) 55 min    Activity Tolerance Patient tolerated treatment well    Behavior During Therapy [facility] for tasks assessed/performed             Past Medical History:  Diagnosis Date   Alcoholism ([place])    Arthritis    Cataract    Colon polyps    Complication of anesthesia    HLD (hyperlipidemia)    Nephritis    when pt. was 7 yrs. ols   Peripheral vascular disease (HCC)    PONV (postoperative nausea and vomiting)    "Only when I have aortagram on 06/23/20".   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   Substance abuse [facility])    Past Surgical History:  Procedure Laterality Date   ABDOMINAL AORTOGRAM W/LOWER EXTREMITY N/A 06/23/2020   Procedure: ABDOMINAL AORTOGRAM W/LOWER EXTREMITY;  Surgeon: [name], MD;  Location: [place] [facility];  Service: Cardiovascular;  Laterality: N/A;   ANTERIOR LAT LUMBAR FUSION Right 02/05/2019   Procedure: ATTEMPTED ANTERIOR LATERAL INTERBODY FUSION;  Surgeon: Consuella Lose, MD;  Location: [place];  Service: Neurosurgery;  Laterality: Right;  RIGHT LATERAL TRANSPSOAS DISCECTOMY AND FUSION WITH ROBOTIC ASSISTED PEDICLE SCREW STABILIZATION, LUMBAR 4- LUMBAR 5   CATARACT EXTRACTION W/ INTRAOCULAR LENS  IMPLANT, BILATERAL Bilateral 2013   lens implants for cataracts after Lasik   COLONOSCOPY     ENDOVEIN HARVEST OF GREATER SAPHENOUS VEIN Left 06/29/2020   Procedure: HARVEST OF LEFT GREATER SAPHENOUS VEIN;  Surgeon: [name], MD;  Location: [place];  Service: Vascular;  Laterality: Left;   FEMORAL-POPLITEAL BYPASS GRAFT Left 06/29/2020   Procedure: BYPASS GRAFT LEFT COMMON FEMORAL TO  ABOVE KNEE POPLITEAL ARTERY;  Surgeon: [name], MD;  Location: [place];  Service: Vascular;  Laterality: Left;   HEMORRHOID SURGERY N/A 04/06/2021   Procedure: HEMORRHOIDECTOMY 2 COLUMN;  Surgeon: Ileana Roup, MD;  Location: [place];  Service: General;  Laterality: N/A;   KNEE ARTHROSCOPY Right [place] N/A 04/06/2021   Procedure: ANORECTAL EXAM UNDER ANESTHESIA/ excision of anal polyp;  Surgeon: Ileana Roup, MD;  Location: [facility];  Service: General;  Laterality: N/A;   TONSILLECTOMY     at a very young age, but grew back   TOTAL HIP ARTHROPLASTY Right 05/25/2019   Procedure: RIGHT TOTAL HIP ARTHROPLASTY ANTERIOR APPROACH;  Surgeon: Leandrew Koyanagi, MD;  Location: [place];  Service: Orthopedics;  Laterality: Right;   TOTAL KNEE ARTHROPLASTY Right 09/04/2021   Procedure: RIGHT TOTAL KNEE ARTHROPLASTY;  Surgeon: Leandrew Koyanagi, MD;  Location: [place];  Service: Orthopedics;  Laterality: Right;   Patient Active Problem List   Diagnosis Date Noted   Status post total right knee replacement 09/04/2021   Leukocytosis 10/11/2020   PAD (peripheral artery disease) ([place]) 06/29/2020   Prediabetes 04/22/2020   Chronic kidney disease, stage 3b ([place]) 04/15/2020   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 04/12/2020   Lower urinary tract symptoms (LUTS) 04/12/2020   History of colon polyps 04/12/2020   Atherosclerosis  of native arteries of extremity with intermittent claudication ([place]) 03/01/2020   Pain in both feet 07/31/2019   Primary osteoarthritis of right knee 07/07/2019   Status post total replacement of right hip 05/25/2019   Lumbar radiculopathy 02/05/2019    PCP: Gena Fray  REFERRING PROVIDER: Lorelee New DIAG: S/P right TKA  THERAPY DIAG:  Acute pain of right knee  Stiffness of right knee, not elsewhere classified  Difficulty in walking, not elsewhere classified  Localized edema  Rationale for  Evaluation and Treatment Rehabilitation  ONSET DATE: 09/04/21  SUBJECTIVE:   SUBJECTIVE STATEMENT: "Just stiff all over"  PERTINENT HISTORY: Lumbar fusion 2020, THA 2021  PAIN:  Are you having pain? Yes: NPRS scale: 0/10 Pain location: right knee, calf, leg and foot Pain description: feels heavy, ache Aggravating factors: touching the calf, stretching the back of the leg pain up to 10/10 Relieving factors: rest, pain meds, ice pain can be a 1/10  PRECAUTIONS: None  WEIGHT BEARING RESTRICTIONS No  FALLS:  Has patient fallen in last 6 months? No  LIVING ENVIRONMENT: Lives with: lives with their family Lives in: House/apartment Stairs: Yes: Internal: 16 steps; can reach both one at a time Has following equipment at home: Single point cane and Walker - 2 wheeled  OCCUPATION: retired  PLOF: Independent  PATIENT GOALS work some, no pain, walk without difficulty   OBJECTIVE:  EDEMA:  Circumferential: left mid patella 37 cm, right 40.5 cm  MUSCLE LENGTH: Very tight calf and HS  POSTURE: rounded shoulders and forward head  PALPATION: Edematous right knee, mild tender, very sore calf but reports that it is much better since Tuesday  LOWER EXTREMITY ROM:  Active Passive ROM Right AROM 09/21/21 Right  PROM 09/21/21 Right  AROM 10/03/21  Hip flexion     Hip extension     Hip abduction     Hip adduction     Hip internal rotation     Hip external rotation     Knee flexion 107 112 115  Knee extension 35 12 20  Ankle dorsiflexion     Ankle plantarflexion     Ankle inversion     Ankle eversion      (Blank rows = not tested)  LOWER EXTREMITY MMT:  MMT Right eval Left eval  Hip flexion 3+ 4-  Hip extension    Hip abduction 3+ 4-  Hip adduction    Hip internal rotation    Hip external rotation    Knee flexion 4-   Knee extension 3+   Ankle dorsiflexion    Ankle plantarflexion    Ankle inversion    Ankle eversion     (Blank rows = not  tested)   FUNCTIONAL TESTS:  Eval: Timed up and go (TUG): 24 seconds with SPC  GAIT: Distance walked: 100 feet Assistive device utilized: Single point cane Level of assistance: SBA Comments: mild stiffness with the knee , does not fully straighten out    TODAY'S TREATMENT:  10/16/21 Recumbent bile L3 x 6 min  Resisted gait 20lb 2x10 Hamstring curls 25lb 2x10, RLE 15lb 2x10  5lb RLE eccentrics 2x5, Leg Ext 10lb 2x10 6in step ups x10 each Leg press 40lb 2x10, RLE 20lb 2x10 TKE  Sit to stands 2x10  Vaso R knee low 34deg x10 min  10/12/21 Recumbent bike L2.7 x 6 min  Step ups 4in x 10 each HHA x2 Hamstring curls 25lb 2x10, LLE 15lb 2x10 Leg extensions 5lb 2x10, 5lb RLE eccentrics 2x5  Leg  press 30lb 2x10, RLE 20lb 2x5 S2S holding yellow ball 2x10  Resisted gaot 30lb 4 way x3 each Vaso R knee low 34deg x10 min  10/10/21  Nustep L5 LE only 6 min Bike L3 5 min Fitter 1 blue band flex and ext 2 sets 10 Leg Press 30# 2 sets 10, RT knee 20# 10 x ( Seat #10) Knee ext 5# 10x  up with 2 down with RT. BIL 10x HS curl 25# 2 sets 10 Joint mobs and jt capsule stretch to help with tightness/pain VASO RT knee 10 min    10/05/21  Nustep L 4 LE only 6 min Resisted gait fwd 5x, back 5 x and laterally 3 x each 30#- pain in distal quad fwd with some buckling d/t pain 6 inch step up fwd and laterally RT 15 x each with heavy UE support STS on airex 10 x with focus on TKE- pain in distal quad RT LE on airex and steping fwd and back with left UE support and then laterally stepping off 15 each Knee ext 5# 2 sets 10 HS curl 20# 2 sets 0 Leg Press 20# 2 sets 10 the SL 10 x VASO RT knee for swelling after session   10/03/21 Nustep level 5 x 6 minutes TUG 16 seconds without device On airex marching and weight shifting for proprioception Leg curls 20# 2x10 LEg extension 5# 2x10 Side stepping on and off airex Leg press 20# 2x10, then right only 2x10 small ROM working on the control and  not snapping knee Resisted gait 30# all directions Vaso medium pressure right knee in elevation    PATIENT EDUCATION:  Education details: reviewed his current HEP Person educated: Patient and Child(ren) Education method: Customer service manager Education comprehension: verbalized understanding   HOME EXERCISE PROGRAM: Continuation of what he is currently doing  ASSESSMENT:  CLINICAL IMPRESSION: pt continues to have good ROM.  RLE weakness remains present with all strengthening interventions. Unable to complete step ups without UE assist. Cue to allow RLE to bend with resisted gait. Assist needed to complete single leg on leg press.    OBJECTIVE IMPAIRMENTS Abnormal gait, decreased activity tolerance, decreased balance, decreased endurance, decreased mobility, difficulty walking, decreased ROM, decreased strength, increased edema, impaired flexibility, and pain.   REHAB POTENTIAL: Good  CLINICAL DECISION MAKING: Stable/uncomplicated  EVALUATION COMPLEXITY: Low   GOALS: Goals reviewed with patient? Yes  SHORT TERM GOALS: Target date:10/05/21 Independent with initial HEP Goal status: met  LONG TERM GOALS: Target date: 12/14/21  Independent with advanced HEP Goal status: INITIAL  2.  Decrease pain 50% Goal status: on going  3.  Walk with minimal deviation and no device community distances Goal status: progressing  4.  Increase AROMof the right knee to 0-120 degrees flexion Goal status: progressing   5.  Increase strength of the right knee to 4+/5 Goal status: INITIAL  PLAN: PT FREQUENCY: 2x/week  PT DURATION: 12 weeks  PLANNED INTERVENTIONS: Therapeutic exercises, Therapeutic activity, Neuromuscular re-education, Balance training, Gait training, Patient/Family education, Self Care, Joint mobilization, Stair training, Electrical stimulation, Cryotherapy, scar mobilization, Vasopneumatic device, and Manual therapy  PLAN FOR NEXT SESSION: continue to add  proprioception and TKE ROM and strength   [name], PTA 10/16/2021, 9:25 AM [place]. [place], [place], [address] Phone: [phone]   Fax:  [phone]  Patient Details  Name: Jahlen Bollman MRN: [id number] Date of Birth: 1950-09-05 Referring Provider:  Haydee Salter, MD  Encounter Date: 10/16/2021   [name], PTA 10/16/2021, 9:25 AM  [place]. [place], [place], [address] Phone: [phone]   Fax:  [place]. [place], [place], [address] Phone: [phone]   Fax:  [phone]  Patient Details  Name: Ardon Franklin MRN: [id number] Date of Birth: 1950/08/31 Referring Provider:  Haydee Salter, MD  Encounter Date: 10/16/2021   [name], PTA 10/16/2021, 9:25 AM  [place]. [place], [place], [address] Phone: [phone]   Fax:  [phone]

## 2021-10-17 ENCOUNTER — Encounter: Payer: Self-pay | Admitting: Neurology

## 2021-10-17 ENCOUNTER — Ambulatory Visit (INDEPENDENT_AMBULATORY_CARE_PROVIDER_SITE_OTHER): Payer: Medicare Other | Admitting: Neurology

## 2021-10-17 VITALS — BP 125/72 | HR 83 | Ht 67.0 in | Wt 159.0 lb

## 2021-10-17 DIAGNOSIS — M79672 Pain in left foot: Secondary | ICD-10-CM

## 2021-10-17 DIAGNOSIS — M7742 Metatarsalgia, left foot: Secondary | ICD-10-CM

## 2021-10-17 DIAGNOSIS — M7741 Metatarsalgia, right foot: Secondary | ICD-10-CM | POA: Diagnosis not present

## 2021-10-17 DIAGNOSIS — M79671 Pain in right foot: Secondary | ICD-10-CM | POA: Diagnosis not present

## 2021-10-17 DIAGNOSIS — M5416 Radiculopathy, lumbar region: Secondary | ICD-10-CM

## 2021-10-17 NOTE — Patient Instructions (Signed)
I saw you today for pain in both feet.  There does not seem to be evidence of a generalized problem of the nerves (peripheral neuropathy). However, there may be some local compression or problem at the ball of your feet.  We have agreed to start with an MRI of your right foot, which is the worst of the two. We may also consider nerve testing with an EMG if we do not find answers, but I think this test would likely not help answer why your feet hurt when standing on them.  I will be in touch when I have the results of your MRI. Please let me know if you have any questions or concerns in the meantime.   The physicians and staff at [facility] are committed to providing excellent care. You may receive a survey requesting feedback about your experience at our office. We strive to receive "very good" responses to the survey questions. If you feel that your experience would prevent you from giving the office a "very good " response, please contact our office to try to remedy the situation. We may be reached at [phone]. Thank you for taking the time out of your busy day to complete the survey.  Kai Levins, MD

## 2021-10-18 ENCOUNTER — Other Ambulatory Visit: Payer: Self-pay | Admitting: Orthopaedic Surgery

## 2021-10-18 ENCOUNTER — Encounter: Payer: Self-pay | Admitting: Orthopaedic Surgery

## 2021-10-18 ENCOUNTER — Ambulatory Visit (INDEPENDENT_AMBULATORY_CARE_PROVIDER_SITE_OTHER): Payer: Medicare Other

## 2021-10-18 ENCOUNTER — Ambulatory Visit (INDEPENDENT_AMBULATORY_CARE_PROVIDER_SITE_OTHER): Payer: Medicare Other | Admitting: Physician Assistant

## 2021-10-18 DIAGNOSIS — Z96651 Presence of right artificial knee joint: Secondary | ICD-10-CM

## 2021-10-18 MED ORDER — METHOCARBAMOL 750 MG PO TABS: 750.0000 mg | ORAL_TABLET | Freq: Two times a day (BID) | ORAL | 2 refills | Status: DC | PRN

## 2021-10-18 MED ORDER — TRAMADOL HCL 50 MG PO TABS: 50.0000 mg | ORAL_TABLET | Freq: Three times a day (TID) | ORAL | 2 refills | Status: DC | PRN

## 2021-10-18 NOTE — Progress Notes (Signed)
Post-Op Visit Note   Patient: Gerald Carpenter           Date of Birth: 11-Jun-1950           MRN: [id number] Visit Date: 10/18/2021 PCP: Haydee Salter, MD   Assessment & Plan:  Chief Complaint:  Chief Complaint  Patient presents with   Right Knee - Follow-up    Right total knee arthroplasty 09/04/2021   Visit Diagnoses:  1. Status post total right knee replacement     Plan: Patient is a pleasant 71 year old gentleman who comes in today 6 weeks status post right total knee replacement 09/04/2021.  He has been doing excellent in physical therapy.  He still notes a fair amount of stiffness worse at night which has slightly improved.  He is using a cane with ambulation as he notes he had significant weakness of the right lower extremity prior to knee replacement surgery.  He is really only taking Tylenol for pain.  He has been compliant taking his baby aspirin twice daily for DVT prophylaxis.  Examination of his right knee reveals a fully healed surgical scar without complication.  Calf is soft nontender.  Range of motion 0 to 125 degrees.  He is stable valgus varus stress.  He is neurovascular intact distally.  At this point, he would like to continue with physical therapy to work more on strengthening exercises as he feels like he would continue to benefit from this.  I agree with this plan.  We will also continue to work on home exercise program.  He may discontinue his baby aspirin twice daily and go back to his regular regimen of once daily.  Dental prophylaxis reinforced.  Follow-up in 6 weeks for recheck.  Call with concerns or questions.  Follow-Up Instructions: Return in about 6 weeks (around 11/29/2021).   Orders:  Orders Placed This Encounter  Procedures   XR Knee 1-2 Views Right   No orders of the defined types were placed in this encounter.   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 was 7 yrs. ols   Peripheral vascular disease ([facility])    PONV (postoperative nausea and vomiting)    "Only when I have aortagram on 06/23/20".   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   Substance abuse ([place])     Family History  Problem Relation Age of Onset   Pulmonary fibrosis Mother    Breast cancer Mother    Hyperlipidemia Mother    Hypertension Mother    Other Mother  Scarlet Fever   Heart disease Father    Hyperlipidemia Father    Diabetes Brother    Hypertension Brother    Breast cancer Maternal Aunt    Prostate cancer Paternal Grandfather    Hypothyroidism Daughter    Colon cancer Neg Hx    Esophageal cancer Neg Hx    Ulcerative colitis Neg Hx    Uterine cancer Neg Hx     Past Surgical History:  Procedure Laterality Date   ABDOMINAL AORTOGRAM W/LOWER EXTREMITY N/A 06/23/2020   Procedure: ABDOMINAL AORTOGRAM W/LOWER EXTREMITY;  Surgeon: [name], MD;  Location: [facility];  Service: Cardiovascular;  Laterality: N/A;   ANTERIOR LAT LUMBAR FUSION Right 02/05/2019   Procedure: ATTEMPTED ANTERIOR  LATERAL INTERBODY FUSION;  Surgeon: Consuella Lose, MD;  Location: [facility];  Service: Neurosurgery;  Laterality: Right;  RIGHT LATERAL TRANSPSOAS DISCECTOMY AND FUSION WITH ROBOTIC ASSISTED PEDICLE SCREW STABILIZATION, LUMBAR 4- LUMBAR 5   CATARACT EXTRACTION W/ INTRAOCULAR LENS  IMPLANT, BILATERAL Bilateral 2013   lens implants for cataracts after Lasik   COLONOSCOPY     ENDOVEIN HARVEST OF GREATER SAPHENOUS VEIN Left 06/29/2020   Procedure: HARVEST OF LEFT GREATER SAPHENOUS VEIN;  Surgeon: [name], MD;  Location: [facility];  Service: Vascular;  Laterality: Left;   FEMORAL-POPLITEAL BYPASS GRAFT Left 06/29/2020   Procedure: BYPASS GRAFT LEFT COMMON FEMORAL TO ABOVE KNEE POPLITEAL ARTERY;  Surgeon: [name], MD;  Location: [facility];  Service: Vascular;  Laterality: Left;   HEMORRHOID SURGERY N/A 04/06/2021   Procedure: HEMORRHOIDECTOMY 2 COLUMN;  Surgeon: Ileana Roup, MD;  Location: [facility];  Service: General;  Laterality: N/A;   KNEE ARTHROSCOPY Right [place] N/A 04/06/2021   Procedure: ANORECTAL EXAM UNDER ANESTHESIA/ excision of anal polyp;  Surgeon: Ileana Roup, MD;  Location: [facility];  Service: General;  Laterality: N/A;   TONSILLECTOMY     at a very young age, but grew back   TOTAL HIP ARTHROPLASTY Right 05/25/2019   Procedure: RIGHT TOTAL HIP ARTHROPLASTY ANTERIOR APPROACH;  Surgeon: Leandrew Koyanagi, MD;  Location: [facility];  Service: Orthopedics;  Laterality: Right;   TOTAL KNEE ARTHROPLASTY Right 09/04/2021   Procedure: RIGHT TOTAL KNEE ARTHROPLASTY;  Surgeon: Leandrew Koyanagi, MD;  Location: [facility];  Service: Orthopedics;  Laterality: Right;   Social History   Occupational History   Occupation: retired  Tobacco Use   Smoking status: Every Day    Packs/day: 0.25    Years: 53.00    Total pack years: 13.25    Types: Cigarettes   Smokeless tobacco: Never   Tobacco comments:    had first  cigarettes age  35, habit started at age 56/today he 17 cig a day  Vaping Use   Vaping Use: Never used  Substance and Sexual Activity   Alcohol use: Not Currently    Comment: 05/10/1991   Drug use: Not Currently    Comment: prior usage of "speed"  when drinking   Sexual activity: Yes

## 2021-10-19 ENCOUNTER — Encounter: Payer: Self-pay | Admitting: Physical Therapy

## 2021-10-19 ENCOUNTER — Ambulatory Visit: Payer: Medicare Other | Admitting: Physical Therapy

## 2021-10-19 DIAGNOSIS — M25561 Pain in right knee: Secondary | ICD-10-CM | POA: Diagnosis not present

## 2021-10-19 DIAGNOSIS — M6281 Muscle weakness (generalized): Secondary | ICD-10-CM

## 2021-10-19 DIAGNOSIS — R262 Difficulty in walking, not elsewhere classified: Secondary | ICD-10-CM

## 2021-10-19 DIAGNOSIS — M25661 Stiffness of right knee, not elsewhere classified: Secondary | ICD-10-CM

## 2021-10-19 DIAGNOSIS — R600 Localized edema: Secondary | ICD-10-CM

## 2021-10-19 NOTE — Therapy (Signed)
OUTPATIENT PHYSICAL THERAPY LOWER EXTREMITY Treatment   Patient Name: Gerald Carpenter MRN: [id number] DOB:December 22, 1950, 71 y.o., male Today's Date: 10/19/2021   PT End of Session - 10/19/21 0753     Visit Number 9    Date for PT Re-Evaluation 12/22/21    PT Start Time 0754    PT Stop Time 0845    PT Time Calculation (min) 51 min    Activity Tolerance Patient tolerated treatment well    Behavior During Therapy [facility] for tasks assessed/performed             Past Medical History:  Diagnosis Date   Alcoholism ([place])    Arthritis    Cataract    Colon polyps    Complication of anesthesia    HLD (hyperlipidemia)    Nephritis    when pt. was 7 yrs. ols   Peripheral vascular disease ([facility])    PONV (postoperative nausea and vomiting)    "Only when I have aortagram on 06/23/20".   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   Substance abuse [facility])    Past Surgical History:  Procedure Laterality Date   ABDOMINAL AORTOGRAM W/LOWER EXTREMITY N/A 06/23/2020   Procedure: ABDOMINAL AORTOGRAM W/LOWER EXTREMITY;  Surgeon: [name], MD;  Location: [facility];  Service: Cardiovascular;  Laterality: N/A;   ANTERIOR LAT LUMBAR FUSION Right 02/05/2019   Procedure: ATTEMPTED ANTERIOR LATERAL INTERBODY FUSION;  Surgeon: Consuella Lose, MD;  Location: [facility];  Service: Neurosurgery;  Laterality: Right;  RIGHT LATERAL TRANSPSOAS DISCECTOMY AND FUSION WITH ROBOTIC ASSISTED PEDICLE SCREW STABILIZATION, LUMBAR 4- LUMBAR 5   CATARACT EXTRACTION W/ INTRAOCULAR LENS  IMPLANT, BILATERAL Bilateral 2013   lens implants for cataracts after Lasik   COLONOSCOPY     ENDOVEIN HARVEST OF GREATER SAPHENOUS VEIN Left 06/29/2020   Procedure: HARVEST OF LEFT GREATER SAPHENOUS VEIN;  Surgeon: [name], MD;  Location: [facility];  Service: Vascular;  Laterality: Left;   FEMORAL-POPLITEAL BYPASS GRAFT Left 06/29/2020   Procedure: BYPASS GRAFT LEFT COMMON FEMORAL TO ABOVE KNEE POPLITEAL  ARTERY;  Surgeon: [name], MD;  Location: [facility];  Service: Vascular;  Laterality: Left;   HEMORRHOID SURGERY N/A 04/06/2021   Procedure: HEMORRHOIDECTOMY 2 COLUMN;  Surgeon: Ileana Roup, MD;  Location: [facility];  Service: General;  Laterality: N/A;   KNEE ARTHROSCOPY Right [place] N/A 04/06/2021   Procedure: ANORECTAL EXAM UNDER ANESTHESIA/ excision of anal polyp;  Surgeon: Ileana Roup, MD;  Location: [facility];  Service: General;  Laterality: N/A;   TONSILLECTOMY     at a very young age, but grew back   TOTAL HIP ARTHROPLASTY Right 05/25/2019   Procedure: RIGHT TOTAL HIP ARTHROPLASTY ANTERIOR APPROACH;  Surgeon: Leandrew Koyanagi, MD;  Location: [facility];  Service: Orthopedics;  Laterality: Right;   TOTAL KNEE ARTHROPLASTY Right 09/04/2021   Procedure: RIGHT TOTAL KNEE ARTHROPLASTY;  Surgeon: Leandrew Koyanagi, MD;  Location: [facility];  Service: Orthopedics;  Laterality: Right;   Patient Active Problem List   Diagnosis Date Noted   Status post total right knee replacement 09/04/2021   Leukocytosis 10/11/2020   PAD (peripheral artery disease) ([place]) 06/29/2020   Prediabetes 04/22/2020   Chronic kidney disease, stage 3b ([place]) 04/15/2020   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 04/12/2020   Lower urinary tract symptoms (LUTS) 04/12/2020   History of colon polyps 04/12/2020   Atherosclerosis of native arteries of extremity with intermittent  claudication ([place]) 03/01/2020   Pain in both feet 07/31/2019   Primary osteoarthritis of right knee 07/07/2019   Status post total replacement of right hip 05/25/2019   Lumbar radiculopathy 02/05/2019    PCP: Gena Fray  REFERRING PROVIDER: Lorelee New DIAG: S/P right TKA  THERAPY DIAG:  Acute pain of right knee  Stiffness of right knee, not elsewhere classified  Difficulty in walking, not elsewhere classified  Localized edema  Muscle weakness  (generalized)  Rationale for Evaluation and Treatment Rehabilitation  ONSET DATE: 09/04/21  SUBJECTIVE:   SUBJECTIVE STATEMENT: Just stiff, same old stuff  PERTINENT HISTORY: Lumbar fusion 2020, THA 2021  PAIN:  Are you having pain? Yes: NPRS scale: 0/10 Pain location: right knee, calf, leg and foot Pain description: feels heavy, ache Aggravating factors: touching the calf, stretching the back of the leg pain up to 10/10 Relieving factors: rest, pain meds, ice pain can be a 1/10  PRECAUTIONS: None  WEIGHT BEARING RESTRICTIONS No  FALLS:  Has patient fallen in last 6 months? No  LIVING ENVIRONMENT: Lives with: lives with their family Lives in: House/apartment Stairs: Yes: Internal: 16 steps; can reach both one at a time Has following equipment at home: Single point cane and Walker - 2 wheeled  OCCUPATION: retired  PLOF: Independent  PATIENT GOALS work some, no pain, walk without difficulty   OBJECTIVE:  EDEMA:  Circumferential: left mid patella 37 cm, right 40.5 cm  MUSCLE LENGTH: Very tight calf and HS  POSTURE: rounded shoulders and forward head  PALPATION: Edematous right knee, mild tender, very sore calf but reports that it is much better since Tuesday  LOWER EXTREMITY ROM:  Active Passive ROM Right AROM 09/21/21 Right  PROM 09/21/21 Right  AROM 10/03/21 R knee AROM 09/3121  Hip flexion      Hip extension      Hip abduction      Hip adduction      Hip internal rotation      Hip external rotation      Knee flexion 107 112 115 118  Knee extension 35 '12 20 16  ' Ankle dorsiflexion      Ankle plantarflexion      Ankle inversion      Ankle eversion       (Blank rows = not tested)  LOWER EXTREMITY MMT:  MMT Right eval Left eval  Hip flexion 3+ 4-  Hip extension    Hip abduction 3+ 4-  Hip adduction    Hip internal rotation    Hip external rotation    Knee flexion 4-   Knee extension 3+   Ankle dorsiflexion    Ankle plantarflexion    Ankle  inversion    Ankle eversion     (Blank rows = not tested)   FUNCTIONAL TESTS:  Eval: Timed up and go (TUG): 24 seconds with SPC  GAIT: Distance walked: 100 feet Assistive device utilized: Single point cane Level of assistance: SBA Comments: mild stiffness with the knee , does not fully straighten out    TODAY'S TREATMENT: 10/19/21 NuStep L5 x 6 min  S2S on airex 2x10 Hamstring curls 25lb 2x12, RLE 15lb 2x10 Leg eccentrics RLE 10lb 2x10 LAQ RLE 3lb 2x10 4in step up x10 each HHA x1 4in lateral step ups RLE x10 Leg press 50lb 2x10, RLE 20lb 2x10 TKE  Vaso R knee low 34deg x10 min   10/16/21 Recumbent bile L3 x 6 min  Resisted gait 20lb 2x10 Hamstring curls 25lb 2x10, RLE  15lb 2x10  5lb RLE eccentrics 2x5, Leg Ext 10lb 2x10 6in step ups x10 each Leg press 40lb 2x10, RLE 20lb 2x10 TKE  Sit to stands 2x10  Vaso R knee low 34deg x10 min  10/12/21 Recumbent bike L2.7 x 6 min  Step ups 4in x 10 each HHA x2 Hamstring curls 25lb 2x10, LLE 15lb 2x10 Leg extensions 5lb 2x10, 5lb RLE eccentrics 2x5  Leg press 30lb 2x10, RLE 20lb 2x5 S2S holding yellow ball 2x10  Resisted gaot 30lb 4 way x3 each Vaso R knee low 34deg x10 min  10/10/21  Nustep L5 LE only 6 min Bike L3 5 min [place] 1 blue band flex and ext 2 sets 10 Leg Press 30# 2 sets 10, RT knee 20# 10 x ( Seat #10) Knee ext 5# 10x  up with 2 down with RT. BIL 10x HS curl 25# 2 sets 10 Joint mobs and jt capsule stretch to help with tightness/pain VASO RT knee 10 min    10/05/21  Nustep L 4 LE only 6 min Resisted gait fwd 5x, back 5 x and laterally 3 x each 30#- pain in distal quad fwd with some buckling d/t pain 6 inch step up fwd and laterally RT 15 x each with heavy UE support STS on airex 10 x with focus on TKE- pain in distal quad RT LE on airex and steping fwd and back with left UE support and then laterally stepping off 15 each Knee ext 5# 2 sets 10 HS curl 20# 2 sets 0 Leg Press 20# 2 sets 10 the SL 10  x VASO RT knee for swelling after session   10/03/21 Nustep level 5 x 6 minutes TUG 16 seconds without device On airex marching and weight shifting for proprioception Leg curls 20# 2x10 LEg extension 5# 2x10 Side stepping on and off airex Leg press 20# 2x10, then right only 2x10 small ROM working on the control and not snapping knee Resisted gait 30# all directions Vaso medium pressure right knee in elevation    PATIENT EDUCATION:  Education details: reviewed his current HEP Person educated: Patient and Child(ren) Education method: Customer service manager Education comprehension: verbalized understanding   HOME EXERCISE PROGRAM: Continuation of what he is currently doing  ASSESSMENT:  CLINICAL IMPRESSION: pt continues to have good R knee ROM.  RLE weakness  present with LAQ and step ups. Unable to complete step ups without UE assist. Attempted single leg on leg extensions machine but was unable to complete due to weakness. No reports of increase pain.    OBJECTIVE IMPAIRMENTS Abnormal gait, decreased activity tolerance, decreased balance, decreased endurance, decreased mobility, difficulty walking, decreased ROM, decreased strength, increased edema, impaired flexibility, and pain.   REHAB POTENTIAL: Good  CLINICAL DECISION MAKING: Stable/uncomplicated  EVALUATION COMPLEXITY: Low   GOALS: Goals reviewed with patient? Yes  SHORT TERM GOALS: Target date:10/05/21 Independent with initial HEP Goal status: met  LONG TERM GOALS: Target date: 12/14/21  Independent with advanced HEP Goal status: INITIAL  2.  Decrease pain 50% Goal status: on going  3.  Walk with minimal deviation and no device community distances Goal status: progressing  4.  Increase AROMof the right knee to 0-120 degrees flexion Goal status: progressing   5.  Increase strength of the right knee to 4+/5 Goal status: INITIAL  PLAN: PT FREQUENCY: 2x/week  PT DURATION: 12 weeks  PLANNED  INTERVENTIONS: Therapeutic exercises, Therapeutic activity, Neuromuscular re-education, Balance training, Gait training, Patient/Family education, Self Care, Joint mobilization, Stair  training, Electrical stimulation, Cryotherapy, scar mobilization, Vasopneumatic device, and Manual therapy  PLAN FOR NEXT SESSION: continue to add proprioception and TKE ROM and strength   [name], PTA 10/19/2021, 7:54 AM [place] [place], [place], [address] Phone: [phone]   Fax:  [phone]  Patient Details  Name: Donta Fuster MRN: [id number] Date of Birth: 12-21-1950 Referring Provider:  Haydee Salter, MD  Encounter Date: 10/19/2021   [name], PTA 10/19/2021, 7:54 AM  [place] [place], [place], [address] Phone: [phone]   Fax:  [facility]. [place], [place], [address] Phone: [phone]   Fax:  [phone]  Patient Details  Name: Cashel Bellina MRN: [id number] Date of Birth: [birth year] Referring Provider:  Haydee Salter, MD  Encounter Date: 10/19/2021   [name], PTA 10/19/2021, 7:54 AM  [place]. [place], [place], [address] Phone: [phone]   Fax:  [phone]

## 2021-10-22 ENCOUNTER — Other Ambulatory Visit: Payer: Self-pay | Admitting: Orthopaedic Surgery

## 2021-10-24 ENCOUNTER — Encounter: Payer: Self-pay | Admitting: Physical Therapy

## 2021-10-24 ENCOUNTER — Ambulatory Visit: Payer: Medicare Other | Attending: Orthopaedic Surgery | Admitting: Physical Therapy

## 2021-10-24 DIAGNOSIS — R600 Localized edema: Secondary | ICD-10-CM | POA: Insufficient documentation

## 2021-10-24 DIAGNOSIS — R262 Difficulty in walking, not elsewhere classified: Secondary | ICD-10-CM | POA: Insufficient documentation

## 2021-10-24 DIAGNOSIS — M6281 Muscle weakness (generalized): Secondary | ICD-10-CM | POA: Insufficient documentation

## 2021-10-24 DIAGNOSIS — M25561 Pain in right knee: Secondary | ICD-10-CM | POA: Insufficient documentation

## 2021-10-24 DIAGNOSIS — M25661 Stiffness of right knee, not elsewhere classified: Secondary | ICD-10-CM | POA: Insufficient documentation

## 2021-10-24 DIAGNOSIS — R2681 Unsteadiness on feet: Secondary | ICD-10-CM | POA: Diagnosis present

## 2021-10-24 NOTE — Therapy (Signed)
OUTPATIENT PHYSICAL THERAPY LOWER EXTREMITY Treatment  Progress Note Reporting Period 09/21/21 to 10/24/21  See note below for Objective Data and Assessment of Progress/Goals.     Patient Name: Gerald Carpenter MRN: [id number] DOB:1950/06/30, 71 y.o., male Today's Date: 10/24/2021   PT End of Session - 10/24/21 0748     Visit Number 10    Date for PT Re-Evaluation 12/22/21    Authorization Type Medicare    PT Start Time 0745    PT Stop Time 0845    PT Time Calculation (min) 60 min    Activity Tolerance Patient tolerated treatment well    Behavior During Therapy WFL for tasks assessed/performed             Past Medical History:  Diagnosis Date   Alcoholism ([place])    Arthritis    Cataract    Colon polyps    Complication of anesthesia    HLD (hyperlipidemia)    Nephritis    when pt. was 7 yrs. ols   Peripheral vascular disease (HCC)    PONV (postoperative nausea and vomiting)    "Only when I have aortagram on 06/23/20".   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   Substance abuse [facility])    Past Surgical History:  Procedure Laterality Date   ABDOMINAL AORTOGRAM W/LOWER EXTREMITY N/A 06/23/2020   Procedure: ABDOMINAL AORTOGRAM W/LOWER EXTREMITY;  Surgeon: [name], MD;  Location: [place] [facility];  Service: Cardiovascular;  Laterality: N/A;   ANTERIOR LAT LUMBAR FUSION Right 02/05/2019   Procedure: ATTEMPTED ANTERIOR LATERAL INTERBODY FUSION;  Surgeon: Consuella Lose, MD;  Location: [place];  Service: Neurosurgery;  Laterality: Right;  RIGHT LATERAL TRANSPSOAS DISCECTOMY AND FUSION WITH ROBOTIC ASSISTED PEDICLE SCREW STABILIZATION, LUMBAR 4- LUMBAR 5   CATARACT EXTRACTION W/ INTRAOCULAR LENS  IMPLANT, BILATERAL Bilateral 2013   lens implants for cataracts after Lasik   COLONOSCOPY     ENDOVEIN HARVEST OF GREATER SAPHENOUS VEIN Left 06/29/2020   Procedure: HARVEST OF LEFT GREATER SAPHENOUS VEIN;  Surgeon: [name], MD;  Location: [place];   Service: Vascular;  Laterality: Left;   FEMORAL-POPLITEAL BYPASS GRAFT Left 06/29/2020   Procedure: BYPASS GRAFT LEFT COMMON FEMORAL TO ABOVE KNEE POPLITEAL ARTERY;  Surgeon: [name], MD;  Location: [place];  Service: Vascular;  Laterality: Left;   HEMORRHOID SURGERY N/A 04/06/2021   Procedure: HEMORRHOIDECTOMY 2 COLUMN;  Surgeon: Ileana Roup, MD;  Location: [place];  Service: General;  Laterality: N/A;   KNEE ARTHROSCOPY Right [place] N/A 04/06/2021   Procedure: ANORECTAL EXAM UNDER ANESTHESIA/ excision of anal polyp;  Surgeon: Ileana Roup, MD;  Location: [facility];  Service: General;  Laterality: N/A;   TONSILLECTOMY     at a very young age, but grew back   TOTAL HIP ARTHROPLASTY Right 05/25/2019   Procedure: RIGHT TOTAL HIP ARTHROPLASTY ANTERIOR APPROACH;  Surgeon: Leandrew Koyanagi, MD;  Location: [place];  Service: Orthopedics;  Laterality: Right;   TOTAL KNEE ARTHROPLASTY Right 09/04/2021   Procedure: RIGHT TOTAL KNEE ARTHROPLASTY;  Surgeon: Leandrew Koyanagi, MD;  Location: [place];  Service: Orthopedics;  Laterality: Right;   Patient Active Problem List   Diagnosis Date Noted   Status post total right knee replacement 09/04/2021   Leukocytosis 10/11/2020   PAD (peripheral artery disease) ([place]) 06/29/2020   Prediabetes 04/22/2020   Chronic kidney disease, stage 3b ([place]) 04/15/2020   Severe tobacco dependence in early  remission 04/12/2020   Lower urinary tract symptoms (LUTS) 04/12/2020   History of colon polyps 04/12/2020   Atherosclerosis of native arteries of extremity with intermittent claudication ([place]) 03/01/2020   Pain in both feet 07/31/2019   Primary osteoarthritis of right knee 07/07/2019   Status post total replacement of right hip 05/25/2019   Lumbar radiculopathy 02/05/2019    PCP: Gena Fray  REFERRING PROVIDER: Lorelee New DIAG: S/P right TKA  THERAPY DIAG:  Acute pain of right  knee  Stiffness of right knee, not elsewhere classified  Difficulty in walking, not elsewhere classified  Localized edema  Muscle weakness (generalized)  Rationale for Evaluation and Treatment Rehabilitation  ONSET DATE: 09/04/21  SUBJECTIVE:   SUBJECTIVE STATEMENT: I have been sleeping a little better, did a little more walking and last night I was just really stiff  PERTINENT HISTORY: Lumbar fusion 2020, THA 2021  PAIN:  Are you having pain? Yes: NPRS scale: 0/10 Pain location: right knee, calf, leg and foot Pain description: feels heavy, ache Aggravating factors: touching the calf, stretching the back of the leg pain up to 10/10 Relieving factors: rest, pain meds, ice pain can be a 1/10  PRECAUTIONS: None  WEIGHT BEARING RESTRICTIONS No  FALLS:  Has patient fallen in last 6 months? No  LIVING ENVIRONMENT: Lives with: lives with their family Lives in: House/apartment Stairs: Yes: Internal: 16 steps; can reach both one at a time Has following equipment at home: Single point cane and Walker - 2 wheeled  OCCUPATION: retired  PLOF: Independent  PATIENT GOALS work some, no pain, walk without difficulty   OBJECTIVE:  EDEMA:  Circumferential: left mid patella 37 cm, right 40.5 cm  MUSCLE LENGTH: Very tight calf and HS  POSTURE: rounded shoulders and forward head  PALPATION: Edematous right knee, mild tender, very sore calf but reports that it is much better since Tuesday  LOWER EXTREMITY ROM:  Active Passive ROM Right AROM 09/21/21 Right  PROM 09/21/21 Right  AROM 10/03/21 R knee AROM 09/3121  Hip flexion      Hip extension      Hip abduction      Hip adduction      Hip internal rotation      Hip external rotation      Knee flexion 107 112 115 118  Knee extension 35 _0 Ankle dorsiflexion      Ankle plantarflexion      Ankle inversion      Ankle eversion       (Blank rows = not tested)  LOWER EXTREMITY MMT:  MMT Right eval Left eval   Hip flexion 3+ 4-  Hip extension    Hip abduction 3+ 4-  Hip adduction    Hip internal rotation    Hip external rotation    Knee flexion 4-   Knee extension 3+   Ankle dorsiflexion    Ankle plantarflexion    Ankle inversion    Ankle eversion     (Blank rows = not tested)   FUNCTIONAL TESTS:  Eval: Timed up and go (TUG): 24 seconds with [facility]  10/24/21 = 13 seconds no AD  GAIT: Distance walked: 100 feet Assistive device utilized: Single point cane Level of assistance: SBA Comments: mild stiffness with the knee , does not fully straighten out    TODAY'S TREATMENT: 10/24/21 Nustep level 5 x 6 minutes Bike level 4 x 5 minutes Walk around building in the back, uneven terrain, 4 rests due to feet  painl and difficulty going down hill Calf stretches Right leg 5# eccentrics, cannot raise the 5# with right only March and bounce on the minitramp On airex ball toss 3# right leg SAQ 2x10 Vaso medium pressure right knee in elevation 34 degrees F  10/19/21 NuStep L5 x 6 min  S2S on airex 2x10 Hamstring curls 25lb 2x12, RLE 15lb 2x10 Leg eccentrics RLE 10lb 2x10 LAQ RLE 3lb 2x10 4in step up x10 each HHA x1 4in lateral step ups RLE x10 Leg press 50lb 2x10, RLE 20lb 2x10 TKE  Vaso R knee low 34deg x10 min   10/16/21 Recumbent bile L3 x 6 min  Resisted gait 20lb 2x10 Hamstring curls 25lb 2x10, RLE 15lb 2x10  5lb RLE eccentrics 2x5, Leg Ext 10lb 2x10 6in step ups x10 each Leg press 40lb 2x10, RLE 20lb 2x10 TKE  Sit to stands 2x10  Vaso R knee low 34deg x10 min  10/12/21 Recumbent bike L2.7 x 6 min  Step ups 4in x 10 each HHA x2 Hamstring curls 25lb 2x10, LLE 15lb 2x10 Leg extensions 5lb 2x10, 5lb RLE eccentrics 2x5  Leg press 30lb 2x10, RLE 20lb 2x5 S2S holding yellow ball 2x10  Resisted gaot 30lb 4 way x3 each Vaso R knee low 34deg x10 min  10/10/21  Nustep L5 LE only 6 min Bike L3 5 min [place] 1 blue band flex and ext 2 sets 10 Leg Press 30# 2 sets 10, RT knee 20# 10  x ( Seat #10) Knee ext 5# 10x  up with 2 down with RT. BIL 10x HS curl 25# 2 sets 10 Joint mobs and jt capsule stretch to help with tightness/pain VASO RT knee 10 min    10/05/21  Nustep L 4 LE only 6 min Resisted gait fwd 5x, back 5 x and laterally 3 x each 30#- pain in distal quad fwd with some buckling d/t pain 6 inch step up fwd and laterally RT 15 x each with heavy UE support STS on airex 10 x with focus on TKE- pain in distal quad RT LE on airex and steping fwd and back with left UE support and then laterally stepping off 15 each Knee ext 5# 2 sets 10 HS curl 20# 2 sets 0 Leg Press 20# 2 sets 10 the SL 10 x VASO RT knee for swelling after session   PATIENT EDUCATION:  Education details: reviewed his current HEP Person educated: Patient and Child(ren) Education method: Customer service manager Education comprehension: verbalized understanding   HOME EXERCISE PROGRAM: Continuation of what he is currently doing  ASSESSMENT:  CLINICAL IMPRESSION: Patient reports fear with going down hill like it is going to give on him, he did have to stop and rest due to feet pain x4 during the walk.  His TUG timed improved greatly.  He has difficulty with the LAQ so I worked some on the [place] Abnormal gait, decreased activity tolerance, decreased balance, decreased endurance, decreased mobility, difficulty walking, decreased ROM, decreased strength, increased edema, impaired flexibility, and pain.   REHAB POTENTIAL: Good  CLINICAL DECISION MAKING: Stable/uncomplicated  EVALUATION COMPLEXITY: Low   GOALS: Goals reviewed with patient? Yes  SHORT TERM GOALS: Target date:10/05/21 Independent with initial HEP Goal status: met  LONG TERM GOALS: Target date: 12/14/21  Independent with advanced HEP Goal status: on going  2.  Decrease pain 50% Goal status: partially met  3.  Walk with minimal deviation and no device community distances Goal status:  progressing  4.  Increase  AROMof the right knee to 0-120 degrees flexion Goal status: progressing   5.  Increase strength of the right knee to 4+/5 Goal status: ongoing  PLAN: PT FREQUENCY: 2x/week  PT DURATION: 12 weeks  PLANNED INTERVENTIONS: Therapeutic exercises, Therapeutic activity, Neuromuscular re-education, Balance training, Gait training, Patient/Family education, Self Care, Joint mobilization, Stair training, Electrical stimulation, Cryotherapy, scar mobilization, Vasopneumatic device, and Manual therapy  PLAN FOR NEXT SESSION: continue to add proprioception and TKE ROM and strength   ALBRIGHT,MICHAEL W, PT 10/24/2021, 7:48 AM [place]. [place], [place], [address] Phone: [phone]   Fax:  [phone]

## 2021-10-25 ENCOUNTER — Ambulatory Visit (INDEPENDENT_AMBULATORY_CARE_PROVIDER_SITE_OTHER): Payer: Medicare Other | Admitting: Family Medicine

## 2021-10-25 ENCOUNTER — Encounter: Payer: Self-pay | Admitting: Family Medicine

## 2021-10-25 VITALS — BP 110/68 | HR 89 | Temp 97.9°F | Ht 67.0 in | Wt 157.4 lb

## 2021-10-25 DIAGNOSIS — M79671 Pain in right foot: Secondary | ICD-10-CM

## 2021-10-25 DIAGNOSIS — F17201 Nicotine dependence, unspecified, in remission: Secondary | ICD-10-CM

## 2021-10-25 DIAGNOSIS — I739 Peripheral vascular disease, unspecified: Secondary | ICD-10-CM

## 2021-10-25 DIAGNOSIS — R399 Unspecified symptoms and signs involving the genitourinary system: Secondary | ICD-10-CM | POA: Diagnosis not present

## 2021-10-25 DIAGNOSIS — Z96651 Presence of right artificial knee joint: Secondary | ICD-10-CM

## 2021-10-25 DIAGNOSIS — M79672 Pain in left foot: Secondary | ICD-10-CM

## 2021-10-25 MED ORDER — ASPIRIN 81 MG PO TBEC: 81.0000 mg | DELAYED_RELEASE_TABLET | Freq: Two times a day (BID) | ORAL | 0 refills | Status: AC

## 2021-10-25 NOTE — Progress Notes (Signed)
[facility] [address] [place] [address] Dept: [phone] Dept Fax: [phone]  Chronic Care Office Visit  Subjective:    Patient ID: Gerald Carpenter, male    DOB: 1950-07-06, 71 y.o..   MRN: [id number]  Chief Complaint  Patient presents with   Follow-up    6 month f/u. No concerns.  Not fasting today.     History of Present Illness:  Patient is in today for reassessment of chronic medical issues.  Gerald Carpenter underwent a right total knee joint replacement on 7/17. He continues physical therapy at this point. He does note stiffness in the knee and some pain associated with the muscles around the joint. Despite this, he notes he is overall doing much better than prior to his surgery.  Gerald Carpenter has a history of claudication due to peripheral artery disease. He underwent a left femoral to above knee popliteal artery bypass in May 2022. He is being managed medically with aspirin and statins currently. The combination of PAD and lumbar disc disease has left him with some weakness and imbalance.    Gerald Carpenter continues to smoke 0-2 cigarettes per day. He continues to work to stop smoking.   Gerald Carpenter has a history of LUTS. He is on Flomax. He notes he is only getting up once a night to urinate.  Gerald Carpenter was referred to neurology related to bilateral foot pain. The neurologist has not been able to determine any neuropathy as the cause. He felt this likely did fit more with a metatarsalgia picture. He does plan an MRI of the foot to assess. Gerald Carpenter has seen multiple podiatrist int he past and has failed management with NSAIDs and orthotics.  Past Medical History: Patient Active Problem List   Diagnosis Date Noted   Status post total right knee replacement 09/04/2021   Leukocytosis 10/11/2020   PAD (peripheral artery disease) ([place]) 06/29/2020   Prediabetes 04/22/2020   Chronic kidney disease, stage 3b ([place])  04/15/2020   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 04/12/2020   Lower urinary tract symptoms (LUTS) 04/12/2020   History of colon polyps 04/12/2020   Atherosclerosis of native arteries of extremity with intermittent claudication ([place]) 03/01/2020   Pain in both feet 07/31/2019   Status post total replacement of right hip 05/25/2019   Lumbar radiculopathy 02/05/2019   Past Surgical History:  Procedure Laterality Date   ABDOMINAL AORTOGRAM W/LOWER EXTREMITY N/A 06/23/2020   Procedure: ABDOMINAL AORTOGRAM W/LOWER EXTREMITY;  Surgeon: [name], MD;  Location: [place] [facility];  Service: Cardiovascular;  Laterality: N/A;   ANTERIOR LAT LUMBAR FUSION Right 02/05/2019   Procedure: ATTEMPTED ANTERIOR LATERAL INTERBODY FUSION;  Surgeon: Consuella Lose, MD;  Location: [place];  Service: Neurosurgery;  Laterality: Right;  RIGHT LATERAL TRANSPSOAS DISCECTOMY AND FUSION WITH ROBOTIC ASSISTED PEDICLE SCREW STABILIZATION, LUMBAR 4- LUMBAR 5   CATARACT EXTRACTION W/ INTRAOCULAR LENS  IMPLANT, BILATERAL Bilateral 2013   lens implants for cataracts after Lasik   COLONOSCOPY     ENDOVEIN HARVEST OF GREATER SAPHENOUS VEIN Left 06/29/2020   Procedure: HARVEST OF LEFT GREATER SAPHENOUS VEIN;  Surgeon: [name], MD;  Location: [place];  Service: Vascular;  Laterality: Left;   FEMORAL-POPLITEAL BYPASS GRAFT Left 06/29/2020   Procedure: BYPASS GRAFT LEFT COMMON FEMORAL TO ABOVE KNEE POPLITEAL ARTERY;  Surgeon: [name], MD;  Location: [place];  Service: Vascular;  Laterality: Left;   HEMORRHOID SURGERY N/A 04/06/2021   Procedure: HEMORRHOIDECTOMY  2 COLUMN;  Surgeon: Ileana Roup, MD;  Location: [facility];  Service: General;  Laterality: N/A;   KNEE ARTHROSCOPY Right 1980   RECTAL EXAM UNDER ANESTHESIA N/A 04/06/2021   Procedure: ANORECTAL EXAM UNDER ANESTHESIA/ excision of anal polyp;  Surgeon: Ileana Roup, MD;  Location: [facility];  Service: General;  Laterality: N/A;   TONSILLECTOMY     at a very young age, but grew back   TOTAL HIP ARTHROPLASTY Right 05/25/2019   Procedure: RIGHT TOTAL HIP ARTHROPLASTY ANTERIOR APPROACH;  Surgeon: Leandrew Koyanagi, MD;  Location: [place];  Service: Orthopedics;  Laterality: Right;   TOTAL KNEE ARTHROPLASTY Right 09/04/2021   Procedure: RIGHT TOTAL KNEE ARTHROPLASTY;  Surgeon: Leandrew Koyanagi, MD;  Location: [place];  Service: Orthopedics;  Laterality: Right;   Family History  Problem Relation Age of Onset   Pulmonary fibrosis Mother    Breast cancer Mother    Hyperlipidemia Mother    Hypertension Mother    Other Mother        Scarlet Fever   Heart disease Father    Hyperlipidemia Father    Diabetes Brother    Hypertension Brother    Breast cancer Maternal Aunt    Prostate cancer Paternal Grandfather    Hypothyroidism Daughter    Colon cancer Neg Hx    Esophageal cancer Neg Hx    Ulcerative colitis Neg Hx    Uterine cancer Neg Hx    Outpatient Medications Prior to Visit  Medication Sig Dispense Refill   atorvastatin (LIPITOR) 40 MG tablet Take 1 tablet (40 mg total) by mouth daily. 90 tablet 3   docusate sodium (COLACE) 100 MG capsule Take 1 capsule (100 mg total) by mouth daily as needed. 30 capsule 2   methocarbamol (ROBAXIN-750) 750 MG tablet Take 1 tablet (750 mg total) by mouth 2 (two) times daily as needed for muscle spasms. 30 tablet 2   ondansetron (ZOFRAN) 4 MG tablet Take 1 tablet (4 mg total) by mouth every 8 (eight) hours as needed for nausea or vomiting. 40 tablet 0   tamsulosin (FLOMAX) 0.4 MG CAPS capsule TAKE 1 CAPSULE(0.4 MG) BY MOUTH IN THE MORNING 90 capsule 1   tiZANidine (ZANAFLEX) 4 MG tablet Take 1 tablet (4 mg total) by mouth every 6 (six) hours as needed for muscle spasms. 30 tablet 2   traMADol (ULTRAM) 50 MG tablet Take 1 tablet (50 mg total) by mouth 3 (three) times daily as needed. 60 tablet 2   Wheat Dextrin-Vit B6-B12-FA ([place]) Take 2 [place] by mouth daily.     aspirin EC 81 MG tablet Take 1 tablet (81 mg total) by mouth 2 (two) times daily. To be taken after surgery to prevent blood clots.  Swallow whole. 84 tablet 0   HYDROcodone-acetaminophen (NORCO) 5-325 MG tablet Take 1-2 tablets by mouth daily as needed. 40 tablet 0   methocarbamol (ROBAXIN-750) 750 MG tablet Take 1 tablet (750 mg total) by mouth 2 (two) times daily as needed for muscle spasms. To be taken after surgery 20 tablet 2   oxyCODONE-acetaminophen (PERCOCET) 5-325 MG tablet Take 1-2 tablets by mouth every 6 (six) hours as needed. To be taken after surgery 40 tablet 0   HYDROcodone-acetaminophen (NORCO) 5-325 MG tablet Take 1 tablet by mouth 3 (three) times daily as needed. 30 tablet 0   No facility-administered medications prior to visit.   Allergies  Allergen Reactions   Anesthetics,  Amide Nausea And Vomiting     Objective:   Today's Vitals   10/25/21 0759  BP: 110/68  Pulse: 89  Temp: 97.9 F (36.6 C)  TempSrc: Temporal  SpO2: 93%  Weight: 157 lb 6.4 oz (71.4 kg)  Height: '5\' 7"'$  (1.702 m)   Body mass index is 24.65 kg/m.   General: Well developed, well nourished. No acute distress. Extremities: Right knee surgical scar is well healed. Psych: Alert and oriented. Normal mood and affect.  Health Maintenance Due  Topic Date Due   Zoster Vaccines- Shingrix (1 of 2) Never done   Pneumonia Vaccine 74+ Years old (2 - PPSV23 or PCV20) 06/07/2020     Lab Results: Lab Results  Component Value Date   CHOL 87 01/11/2021   HDL 21.80 (L) 01/11/2021   LDLCALC 54 01/11/2021   TRIG 57.0 01/11/2021   CHOLHDL 4 01/11/2021    Assessment & Plan:   1. Status post total right knee replacement Continue to work with physical therapy. Recommend he massage the wound with lotion to reduce touchiness.  2. Pain in both feet Likely metatarsalgia. MRI pending.  3. 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 I continue to encourage Mr.  Ottey to try and completely stop his tobacco use.  4. Lower urinary tract symptoms (LUTS) Stable. Continue Flomax 0.4 mg nightly.  5. PAD (peripheral artery disease) (HCC) Stable. Remains at high risk due to ongoing tobacco use. Lipids are at goal on atorvastatin 40 mg daily. We will reassess fasting lipids at his next visit.  - aspirin EC 81 MG tablet; Take 1 tablet (81 mg total) by mouth 2 (two) times daily. To be taken after surgery to prevent blood clots.  Swallow whole.  Dispense: 84 tablet; Refill: 0   Return in about 6 months (around 04/25/2022) for Reassessment.   Haydee Salter, MD

## 2021-10-26 ENCOUNTER — Other Ambulatory Visit: Payer: Self-pay | Admitting: Orthopaedic Surgery

## 2021-10-26 ENCOUNTER — Ambulatory Visit: Payer: Medicare Other | Admitting: Physical Therapy

## 2021-10-26 DIAGNOSIS — R262 Difficulty in walking, not elsewhere classified: Secondary | ICD-10-CM

## 2021-10-26 DIAGNOSIS — R600 Localized edema: Secondary | ICD-10-CM

## 2021-10-26 DIAGNOSIS — M25661 Stiffness of right knee, not elsewhere classified: Secondary | ICD-10-CM

## 2021-10-26 DIAGNOSIS — M25561 Pain in right knee: Secondary | ICD-10-CM | POA: Diagnosis not present

## 2021-10-26 DIAGNOSIS — M6281 Muscle weakness (generalized): Secondary | ICD-10-CM

## 2021-10-26 NOTE — Therapy (Signed)
OUTPATIENT PHYSICAL THERAPY LOWER EXTREMITY Treatment  Progress Note Reporting Period 09/21/21 to 10/24/21  See note below for Objective Data and Assessment of Progress/Goals.     Patient Name: Gerald Carpenter MRN: [id number] DOB:[birth year], 71 y.o., male Today's Date: 10/26/2021   PT End of Session - 10/26/21 0747     Visit Number 11    Date for PT Re-Evaluation 12/22/21    Authorization Type Medicare    PT Start Time 0745    PT Stop Time 0845    PT Time Calculation (min) 60 min             Past Medical History:  Diagnosis Date   Alcoholism ([place])    Arthritis    Cataract    Colon polyps    Complication of anesthesia    HLD (hyperlipidemia)    Nephritis    when pt. was 7 yrs. ols   Peripheral vascular disease (HCC)    PONV (postoperative nausea and vomiting)    "Only when I have aortagram on 06/23/20".   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   Substance abuse [facility])    Past Surgical History:  Procedure Laterality Date   ABDOMINAL AORTOGRAM W/LOWER EXTREMITY N/A 06/23/2020   Procedure: ABDOMINAL AORTOGRAM W/LOWER EXTREMITY;  Surgeon: [name], MD;  Location: [place] [facility];  Service: Cardiovascular;  Laterality: N/A;   ANTERIOR LAT LUMBAR FUSION Right 02/05/2019   Procedure: ATTEMPTED ANTERIOR LATERAL INTERBODY FUSION;  Surgeon: Consuella Lose, MD;  Location: [place];  Service: Neurosurgery;  Laterality: Right;  RIGHT LATERAL TRANSPSOAS DISCECTOMY AND FUSION WITH ROBOTIC ASSISTED PEDICLE SCREW STABILIZATION, LUMBAR 4- LUMBAR 5   CATARACT EXTRACTION W/ INTRAOCULAR LENS  IMPLANT, BILATERAL Bilateral 2013   lens implants for cataracts after Lasik   COLONOSCOPY     ENDOVEIN HARVEST OF GREATER SAPHENOUS VEIN Left 06/29/2020   Procedure: HARVEST OF LEFT GREATER SAPHENOUS VEIN;  Surgeon: [name], MD;  Location: [place];  Service: Vascular;  Laterality: Left;   FEMORAL-POPLITEAL BYPASS GRAFT Left 06/29/2020   Procedure: BYPASS GRAFT LEFT  COMMON FEMORAL TO ABOVE KNEE POPLITEAL ARTERY;  Surgeon: [name], MD;  Location: [place];  Service: Vascular;  Laterality: Left;   HEMORRHOID SURGERY N/A 04/06/2021   Procedure: HEMORRHOIDECTOMY 2 COLUMN;  Surgeon: Ileana Roup, MD;  Location: [place];  Service: General;  Laterality: N/A;   KNEE ARTHROSCOPY Right [place] N/A 04/06/2021   Procedure: ANORECTAL EXAM UNDER ANESTHESIA/ excision of anal polyp;  Surgeon: Ileana Roup, MD;  Location: [facility];  Service: General;  Laterality: N/A;   TONSILLECTOMY     at a very young age, but grew back   TOTAL HIP ARTHROPLASTY Right 05/25/2019   Procedure: RIGHT TOTAL HIP ARTHROPLASTY ANTERIOR APPROACH;  Surgeon: Leandrew Koyanagi, MD;  Location: [place];  Service: Orthopedics;  Laterality: Right;   TOTAL KNEE ARTHROPLASTY Right 09/04/2021   Procedure: RIGHT TOTAL KNEE ARTHROPLASTY;  Surgeon: Leandrew Koyanagi, MD;  Location: [place];  Service: Orthopedics;  Laterality: Right;   Patient Active Problem List   Diagnosis Date Noted   Status post total right knee replacement 09/04/2021   Leukocytosis 10/11/2020   PAD (peripheral artery disease) ([place]) 06/29/2020   Prediabetes 04/22/2020   Chronic kidney disease, stage 3b ([place]) 04/15/2020   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 04/12/2020   Lower urinary tract symptoms (LUTS) 04/12/2020   History of colon polyps 04/12/2020  Atherosclerosis of native arteries of extremity with intermittent claudication ([place]) 03/01/2020   Pain in both feet 07/31/2019   Status post total replacement of right hip 05/25/2019   Lumbar radiculopathy 02/05/2019    PCP: Gena Fray  REFERRING PROVIDER: Lorelee New DIAG: S/P right TKA  THERAPY DIAG:  Stiffness of right knee, not elsewhere classified  Difficulty in walking, not elsewhere classified  Localized edema  Muscle weakness (generalized)  Rationale for Evaluation and Treatment  Rehabilitation  ONSET DATE: 09/04/21  SUBJECTIVE:   SUBJECTIVE STATEMENT: I have been sleeping a little better, stiffness as always and some muscle soreness  PERTINENT HISTORY: Lumbar fusion 2020, THA 2021  PAIN:  Are you having pain? Yes: NPRS scale: 0/10 Pain location: right knee, calf, leg and foot Pain description: feels heavy, ache Aggravating factors: touching the calf, stretching the back of the leg pain up to 10/10 Relieving factors: rest, pain meds, ice pain can be a 1/10  PRECAUTIONS: None  WEIGHT BEARING RESTRICTIONS No  FALLS:  Has patient fallen in last 6 months? No  LIVING ENVIRONMENT: Lives with: lives with their family Lives in: House/apartment Stairs: Yes: Internal: 16 steps; can reach both one at a time Has following equipment at home: Single point cane and Walker - 2 wheeled  OCCUPATION: retired  PLOF: Independent  PATIENT GOALS work some, no pain, walk without difficulty   OBJECTIVE:  EDEMA:  Circumferential: left mid patella 37 cm, right 40.5 cm  MUSCLE LENGTH: Very tight calf and HS  POSTURE: rounded shoulders and forward head  PALPATION: Edematous right knee, mild tender, very sore calf but reports that it is much better since Tuesday  LOWER EXTREMITY ROM:  Active Passive ROM Right AROM 09/21/21 Right  PROM 09/21/21 Right  AROM 10/03/21 R knee AROM 09/3121 RT knee AROM sitting 10/26/21   Hip flexion       Hip extension       Hip abduction       Hip adduction       Hip internal rotation       Hip external rotation       Knee flexion 107 112 115 118 120  Knee extension 35 '12 20 16 6  ' Ankle dorsiflexion       Ankle plantarflexion       Ankle inversion       Ankle eversion        (Blank rows = not tested)  LOWER EXTREMITY MMT:  MMT Right eval Left eval RT knee 10/26/21  Hip flexion 3+ 4- 4+  Hip extension     Hip abduction 3+ 4-   Hip adduction     Hip internal rotation     Hip external rotation     Knee flexion 4-  4  Knee  extension 3+  4+  Ankle dorsiflexion     Ankle plantarflexion     Ankle inversion     Ankle eversion      (Blank rows = not tested)   FUNCTIONAL TESTS:  Eval: Timed up and go (TUG): 24 seconds with [facility]  10/24/21 = 13 seconds no AD  GAIT: Distance walked: 100 feet Assistive device utilized: Single point cane Level of assistance: SBA Comments: mild stiffness with the knee , does not fully straighten out    TODAY'S TREATMENT:  10/26/21  Nustep L5 6 min LE only LAQ 3# 2 sets 10 HOLD 3 sec at [facility] 3# standing HS curl 2 sets 10 TKE blue tband 2 sets 10  standing 4 inch step up over and reverse 10x then 6 in 10 x RT LE Knee ext 10# up with 2 eccentric lower RT  2 set 10 HS curl RT only 15# 2 sets 10 Leg Press BIL LE 50# 2 sets 10, RT LE only 20# 2 sets 10. Calf raises 50# 2 sets 10 Vaso Rt knee with elevation  10/24/21 Nustep level 5 x 6 minutes Bike level 4 x 5 minutes Walk around building in the back, uneven terrain, 4 rests due to feet painl and difficulty going down hill Calf stretches Right leg 5# eccentrics, cannot raise the 5# with right only March and bounce on the minitramp On airex ball toss 3# right leg SAQ 2x10 Vaso medium pressure right knee in elevation 34 degrees F  10/19/21 NuStep L5 x 6 min  S2S on airex 2x10 Hamstring curls 25lb 2x12, RLE 15lb 2x10 Leg eccentrics RLE 10lb 2x10 LAQ RLE 3lb 2x10 4in step up x10 each HHA x1 4in lateral step ups RLE x10 Leg press 50lb 2x10, RLE 20lb 2x10 TKE  Vaso R knee low 34deg x10 min   10/16/21 Recumbent bile L3 x 6 min  Resisted gait 20lb 2x10 Hamstring curls 25lb 2x10, RLE 15lb 2x10  5lb RLE eccentrics 2x5, Leg Ext 10lb 2x10 6in step ups x10 each Leg press 40lb 2x10, RLE 20lb 2x10 TKE  Sit to stands 2x10  Vaso R knee low 34deg x10 min  10/12/21 Recumbent bike L2.7 x 6 min  Step ups 4in x 10 each HHA x2 Hamstring curls 25lb 2x10, LLE 15lb 2x10 Leg extensions 5lb 2x10, 5lb RLE eccentrics 2x5  Leg press 30lb  2x10, RLE 20lb 2x5 S2S holding yellow ball 2x10  Resisted gaot 30lb 4 way x3 each Vaso R knee low 34deg x10 min  10/10/21  Nustep L5 LE only 6 min Bike L3 5 min Fitter 1 blue band flex and ext 2 sets 10 Leg Press 30# 2 sets 10, RT knee 20# 10 x ( Seat #10) Knee ext 5# 10x  up with 2 down with RT. BIL 10x HS curl 25# 2 sets 10 Joint mobs and jt capsule stretch to help with tightness/pain VASO RT knee 10 min    10/05/21  Nustep L 4 LE only 6 min Resisted gait fwd 5x, back 5 x and laterally 3 x each 30#- pain in distal quad fwd with some buckling d/t pain 6 inch step up fwd and laterally RT 15 x each with heavy UE support STS on airex 10 x with focus on TKE- pain in distal quad RT LE on airex and steping fwd and back with left UE support and then laterally stepping off 15 each Knee ext 5# 2 sets 10 HS curl 20# 2 sets 0 Leg Press 20# 2 sets 10 the SL 10 x VASO RT knee for swelling after session   PATIENT EDUCATION:  Education details: reviewed his current HEP Person educated: Patient and Child(ren) Education method: Customer service manager Education comprehension: verbalized understanding   HOME EXERCISE PROGRAM: Continuation of what he is currently doing  ASSESSMENT:  CLINICAL IMPRESSION: Pt reports knee still buckles at times but with further questioning it is more of a hyperextension mvmt. Improved ROM and strength. MMT for quad 4+ but still weak with machines interventions.Progression with all goals OBJECTIVE IMPAIRMENTS Abnormal gait, decreased activity tolerance, decreased balance, decreased endurance, decreased mobility, difficulty walking, decreased ROM, decreased strength, increased edema, impaired flexibility, and pain.   REHAB POTENTIAL: Good  CLINICAL  DECISION MAKING: Stable/uncomplicated  EVALUATION COMPLEXITY: Low   GOALS: Goals reviewed with patient? Yes  SHORT TERM GOALS: Target date:10/05/21 Independent with initial HEP Goal status:  met  LONG TERM GOALS: Target date: 12/14/21  Independent with advanced HEP Goal status: on going  2.  Decrease pain 50% Goal status: partially met  3.  Walk with minimal deviation and no device community distances Goal status: progressing  4.  Increase AROMof the right knee to 0-120 degrees flexion Goal status: progressing   5.  Increase strength of the right knee to 4+/5 Goal status: ongoing  PLAN: PT FREQUENCY: 2x/week  PT DURATION: 12 weeks  PLANNED INTERVENTIONS: Therapeutic exercises, Therapeutic activity, Neuromuscular re-education, Balance training, Gait training, Patient/Family education, Self Care, Joint mobilization, Stair training, Electrical stimulation, Cryotherapy, scar mobilization, Vasopneumatic device, and Manual therapy  PLAN FOR NEXT SESSION: continue to add proprioception and TKE ROM and strength   Alesandro Stueve,ANGIE, PTA 10/26/2021, 8:19 AM [address] [place], [place], [address] Phone: [phone]   Fax:  [place]. [place], [place], [address] Phone: [phone]   Fax:  [phone]  Patient Details  Name: Gerald Carpenter MRN: [id number] Date of Birth: Feb 24, 1950 Referring Provider:  Haydee Salter, MD  Encounter Date: 10/26/2021   Laqueta Carina, PTA 10/26/2021, 8:19 AM  [place]. [place], [place], [address] Phone: [phone]   Fax:  [phone]

## 2021-10-31 ENCOUNTER — Ambulatory Visit: Payer: Medicare Other | Admitting: Physical Therapy

## 2021-10-31 ENCOUNTER — Encounter: Payer: Self-pay | Admitting: Physical Therapy

## 2021-10-31 DIAGNOSIS — M6281 Muscle weakness (generalized): Secondary | ICD-10-CM

## 2021-10-31 DIAGNOSIS — R600 Localized edema: Secondary | ICD-10-CM

## 2021-10-31 DIAGNOSIS — R262 Difficulty in walking, not elsewhere classified: Secondary | ICD-10-CM

## 2021-10-31 DIAGNOSIS — M25561 Pain in right knee: Secondary | ICD-10-CM | POA: Diagnosis not present

## 2021-10-31 DIAGNOSIS — M25661 Stiffness of right knee, not elsewhere classified: Secondary | ICD-10-CM

## 2021-10-31 NOTE — Therapy (Signed)
OUTPATIENT PHYSICAL THERAPY LOWER EXTREMITY TREATMENT    Patient Name: Gerald Carpenter MRN: [id number] DOB:Mar 22, 1950, 71 y.o., male Today's Date: 10/31/2021   PT End of Session - 10/31/21 0844     Visit Number 12    Date for PT Re-Evaluation 12/22/21    Authorization Type Medicare    PT Start Time 0845    PT Stop Time 0940    PT Time Calculation (min) 55 min    Activity Tolerance Patient tolerated treatment well    Behavior During Therapy WFL for tasks assessed/performed             Past Medical History:  Diagnosis Date   Alcoholism ([place])    Arthritis    Cataract    Colon polyps    Complication of anesthesia    HLD (hyperlipidemia)    Nephritis    when pt. was 7 yrs. ols   Peripheral vascular disease (HCC)    PONV (postoperative nausea and vomiting)    "Only when I have aortagram on 06/23/20".   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   Substance abuse [facility])    Past Surgical History:  Procedure Laterality Date   ABDOMINAL AORTOGRAM W/LOWER EXTREMITY N/A 06/23/2020   Procedure: ABDOMINAL AORTOGRAM W/LOWER EXTREMITY;  Surgeon: [name], MD;  Location: [place] [facility];  Service: Cardiovascular;  Laterality: N/A;   ANTERIOR LAT LUMBAR FUSION Right 02/05/2019   Procedure: ATTEMPTED ANTERIOR LATERAL INTERBODY FUSION;  Surgeon: Consuella Lose, MD;  Location: [place];  Service: Neurosurgery;  Laterality: Right;  RIGHT LATERAL TRANSPSOAS DISCECTOMY AND FUSION WITH ROBOTIC ASSISTED PEDICLE SCREW STABILIZATION, LUMBAR 4- LUMBAR 5   CATARACT EXTRACTION W/ INTRAOCULAR LENS  IMPLANT, BILATERAL Bilateral 2013   lens implants for cataracts after Lasik   COLONOSCOPY     ENDOVEIN HARVEST OF GREATER SAPHENOUS VEIN Left 06/29/2020   Procedure: HARVEST OF LEFT GREATER SAPHENOUS VEIN;  Surgeon: [name], MD;  Location: [place];  Service: Vascular;  Laterality: Left;   FEMORAL-POPLITEAL BYPASS GRAFT Left 06/29/2020   Procedure: BYPASS GRAFT LEFT COMMON  FEMORAL TO ABOVE KNEE POPLITEAL ARTERY;  Surgeon: [name], MD;  Location: [place];  Service: Vascular;  Laterality: Left;   HEMORRHOID SURGERY N/A 04/06/2021   Procedure: HEMORRHOIDECTOMY 2 COLUMN;  Surgeon: Ileana Roup, MD;  Location: [place];  Service: General;  Laterality: N/A;   KNEE ARTHROSCOPY Right [place] N/A 04/06/2021   Procedure: ANORECTAL EXAM UNDER ANESTHESIA/ excision of anal polyp;  Surgeon: Ileana Roup, MD;  Location: [facility];  Service: General;  Laterality: N/A;   TONSILLECTOMY     at a very young age, but grew back   TOTAL HIP ARTHROPLASTY Right 05/25/2019   Procedure: RIGHT TOTAL HIP ARTHROPLASTY ANTERIOR APPROACH;  Surgeon: Leandrew Koyanagi, MD;  Location: [place];  Service: Orthopedics;  Laterality: Right;   TOTAL KNEE ARTHROPLASTY Right 09/04/2021   Procedure: RIGHT TOTAL KNEE ARTHROPLASTY;  Surgeon: Leandrew Koyanagi, MD;  Location: [place];  Service: Orthopedics;  Laterality: Right;   Patient Active Problem List   Diagnosis Date Noted   Status post total right knee replacement 09/04/2021   Leukocytosis 10/11/2020   PAD (peripheral artery disease) ([place]) 06/29/2020   Prediabetes 04/22/2020   Chronic kidney disease, stage 3b ([place]) 04/15/2020   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 04/12/2020   Lower urinary tract symptoms (LUTS) 04/12/2020   History of colon polyps 04/12/2020   Atherosclerosis  of native arteries of extremity with intermittent claudication ([place]) 03/01/2020   Pain in both feet 07/31/2019   Status post total replacement of right hip 05/25/2019   Lumbar radiculopathy 02/05/2019    PCP: Gena Fray  REFERRING PROVIDER: Lorelee New DIAG: S/P right TKA  THERAPY DIAG:  Stiffness of right knee, not elsewhere classified  Difficulty in walking, not elsewhere classified  Localized edema  Muscle weakness (generalized)  Acute pain of right knee  Rationale for  Evaluation and Treatment Rehabilitation  ONSET DATE: 09/04/21  SUBJECTIVE:   SUBJECTIVE STATEMENT: I have been sleeping a little better, stiffness as always and some muscle soreness  PERTINENT HISTORY: Lumbar fusion 2020, THA 2021  PAIN:  Are you having pain? Yes: NPRS scale: 0/10 Pain location: right knee, calf, leg and foot Pain description: feels heavy, ache Aggravating factors: touching the calf, stretching the back of the leg pain up to 10/10 Relieving factors: rest, pain meds, ice pain can be a 1/10  PRECAUTIONS: None  WEIGHT BEARING RESTRICTIONS No  FALLS:  Has patient fallen in last 6 months? No  LIVING ENVIRONMENT: Lives with: lives with their family Lives in: House/apartment Stairs: Yes: Internal: 16 steps; can reach both one at a time Has following equipment at home: Single point cane and Walker - 2 wheeled  OCCUPATION: retired  PLOF: [place] work some, no pain, walk without difficulty   OBJECTIVE:  EDEMA:  Circumferential: left mid patella 37 cm, right 40.5 cm  MUSCLE LENGTH: Very tight calf and HS  POSTURE: rounded shoulders and forward head  PALPATION: Edematous right knee, mild tender, very sore calf but reports that it is much better since Tuesday  LOWER EXTREMITY ROM:  Active Passive ROM Right AROM 09/21/21 Right  PROM 09/21/21 Right  AROM 10/03/21 R knee AROM 09/3121 RT knee AROM sitting 10/26/21   Hip flexion       Hip extension       Hip abduction       Hip adduction       Hip internal rotation       Hip external rotation       Knee flexion 107 112 115 118 120  Knee extension 35 '12 20 16 6  ' Ankle dorsiflexion       Ankle plantarflexion       Ankle inversion       Ankle eversion        (Blank rows = not tested)  LOWER EXTREMITY MMT:  MMT Right eval Left eval RT knee 10/26/21  Hip flexion 3+ 4- 4+  Hip extension     Hip abduction 3+ 4-   Hip adduction     Hip internal rotation     Hip external rotation      Knee flexion 4-  4  Knee extension 3+  4+  Ankle dorsiflexion     Ankle plantarflexion     Ankle inversion     Ankle eversion      (Blank rows = not tested)   FUNCTIONAL TESTS:  Eval: Timed up and go (TUG): 24 seconds with [facility]  10/24/21 = 13 seconds no AD  GAIT: Distance walked: 100 feet Assistive device utilized: Single point cane Level of assistance: SBA Comments: mild stiffness with the knee , does not fully straighten out    TODAY'S TREATMENT: 10/31/21 Recumbent bike L3.5 x 6 min LAQ 3# 3 sets 10 HOLD 3 sec at TKE Leg Press BIL LE 50# 2 sets 10, RT LE  only 20# 2 sets 10. Calf raises 50# 2 sets 10 Resisted gait 20lb 4 way x 4 each 4in step up x10 HHA x 2  Knee ext 10# up with 2 eccentric lower RT  2 set 10 HS curl RT only 15# 2 sets 10 Vaso Rt knee with elevation   10/26/21  Nustep L5 6 min LE only LAQ 3# 2 sets 10 HOLD 3 sec at [facility] 3# standing HS curl 2 sets 10 TKE blue tband 2 sets 10 standing 4 inch step up over and reverse 10x then 6 in 10 x RT LE Knee ext 10# up with 2 eccentric lower RT  2 set 10 HS curl RT only 15# 2 sets 10 Leg Press BIL LE 50# 2 sets 10, RT LE only 20# 2 sets 10. Calf raises 50# 2 sets 10 Vaso Rt knee with elevation  10/24/21 Nustep level 5 x 6 minutes Bike level 4 x 5 minutes Walk around building in the back, uneven terrain, 4 rests due to feet painl and difficulty going down hill Calf stretches Right leg 5# eccentrics, cannot raise the 5# with right only March and bounce on the minitramp On airex ball toss 3# right leg SAQ 2x10 Vaso medium pressure right knee in elevation 34 degrees F  10/19/21 NuStep L5 x 6 min  S2S on airex 2x10 Hamstring curls 25lb 2x12, RLE 15lb 2x10 Leg eccentrics RLE 10lb 2x10 LAQ RLE 3lb 2x10 4in step up x10 each HHA x1 4in lateral step ups RLE x10 Leg press 50lb 2x10, RLE 20lb 2x10 TKE  Vaso R knee low 34deg x10 min   10/16/21 Recumbent bile L3 x 6 min  Resisted gait 20lb 2x10 Hamstring curls 25lb  2x10, RLE 15lb 2x10  5lb RLE eccentrics 2x5, Leg Ext 10lb 2x10 6in step ups x10 each Leg press 40lb 2x10, RLE 20lb 2x10 TKE  Sit to stands 2x10  Vaso R knee low 34deg x10 min  10/12/21 Recumbent bike L2.7 x 6 min  Step ups 4in x 10 each HHA x2 Hamstring curls 25lb 2x10, LLE 15lb 2x10 Leg extensions 5lb 2x10, 5lb RLE eccentrics 2x5  Leg press 30lb 2x10, RLE 20lb 2x5 S2S holding yellow ball 2x10  Resisted gaot 30lb 4 way x3 each Vaso R knee low 34deg x10 min  10/10/21  Nustep L5 LE only 6 min Bike L3 5 min Fitter 1 blue band flex and ext 2 sets 10 Leg Press 30# 2 sets 10, RT knee 20# 10 x ( Seat #10) Knee ext 5# 10x  up with 2 down with RT. BIL 10x HS curl 25# 2 sets 10 Joint mobs and jt capsule stretch to help with tightness/pain VASO RT knee 10 min     PATIENT EDUCATION:  Education details: reviewed his current HEP Person educated: Patient and Child(ren) Education method: Customer service manager Education comprehension: verbalized understanding   HOME EXERCISE PROGRAM: Continuation of what he is currently doing  ASSESSMENT:  CLINICAL IMPRESSION: Progression with all goals. Continued with RLE strength to improve functional mobility and independence. PT stated his knee continues to "Buckle" but pt demonstrated hyperextension. R quad remains weak.   OBJECTIVE IMPAIRMENTS Abnormal gait, decreased activity tolerance, decreased balance, decreased endurance, decreased mobility, difficulty walking, decreased ROM, decreased strength, increased edema, impaired flexibility, and pain.   REHAB POTENTIAL: Good  CLINICAL DECISION MAKING: Stable/uncomplicated  EVALUATION COMPLEXITY: Low   GOALS: Goals reviewed with patient? Yes  SHORT TERM GOALS: Target date:10/05/21 Independent with initial HEP Goal status: met  LONG TERM GOALS: Target date: 12/14/21  Independent with advanced HEP Goal status: on going  2.  Decrease pain 50% Goal status: partially met  3.   Walk with minimal deviation and no device community distances Goal status: progressing  4.  Increase AROMof the right knee to 0-120 degrees flexion Goal status: progressing   5.  Increase strength of the right knee to 4+/5 Goal status: ongoing  PLAN: PT FREQUENCY: 2x/week  PT DURATION: 12 weeks  PLANNED INTERVENTIONS: Therapeutic exercises, Therapeutic activity, Neuromuscular re-education, Balance training, Gait training, Patient/Family education, Self Care, Joint mobilization, Stair training, Electrical stimulation, Cryotherapy, scar mobilization, Vasopneumatic device, and Manual therapy  PLAN FOR NEXT SESSION: continue to add proprioception and TKE ROM and strength   [name], PTA 10/31/2021, 9:25 AM [place]. [place], [place], [address] Phone: [phone]   Fax:  [place]. [place], [place], [address] Phone: [phone]   Fax:  [phone]  Patient Details  Name: Stepfon Rawles MRN: [id number] Date of Birth: 10/21/1950 Referring Provider:  Haydee Salter, MD  Encounter Date: 10/31/2021   [name], PTA 10/31/2021, 9:25 AM  [place]. [place], [place], [address] Phone: [phone]   Fax:  [phone]

## 2021-11-02 ENCOUNTER — Encounter: Payer: Self-pay | Admitting: Physical Therapy

## 2021-11-02 ENCOUNTER — Ambulatory Visit: Payer: Medicare Other | Admitting: Physical Therapy

## 2021-11-02 DIAGNOSIS — M6281 Muscle weakness (generalized): Secondary | ICD-10-CM

## 2021-11-02 DIAGNOSIS — R600 Localized edema: Secondary | ICD-10-CM

## 2021-11-02 DIAGNOSIS — R262 Difficulty in walking, not elsewhere classified: Secondary | ICD-10-CM

## 2021-11-02 DIAGNOSIS — M25661 Stiffness of right knee, not elsewhere classified: Secondary | ICD-10-CM

## 2021-11-02 DIAGNOSIS — M25561 Pain in right knee: Secondary | ICD-10-CM | POA: Diagnosis not present

## 2021-11-02 NOTE — Therapy (Signed)
OUTPATIENT PHYSICAL THERAPY LOWER EXTREMITY TREATMENT    Patient Name: Gerald Carpenter MRN: [id number] DOB:19-Sep-1950, 71 y.o., male Today's Date: 11/02/2021   PT End of Session - 11/02/21 0755     Visit Number 13    Date for PT Re-Evaluation 12/22/21    PT Start Time 0754    PT Stop Time 0846    PT Time Calculation (min) 52 min    Activity Tolerance Patient tolerated treatment well    Behavior During Therapy [facility] for tasks assessed/performed             Past Medical History:  Diagnosis Date   Alcoholism ([place])    Arthritis    Cataract    Colon polyps    Complication of anesthesia    HLD (hyperlipidemia)    Nephritis    when pt. was 7 yrs. ols   Peripheral vascular disease (HCC)    PONV (postoperative nausea and vomiting)    "Only when I have aortagram on 06/23/20".   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   Substance abuse [facility])    Past Surgical History:  Procedure Laterality Date   ABDOMINAL AORTOGRAM W/LOWER EXTREMITY N/A 06/23/2020   Procedure: ABDOMINAL AORTOGRAM W/LOWER EXTREMITY;  Surgeon: [name], MD;  Location: [place] [facility];  Service: Cardiovascular;  Laterality: N/A;   ANTERIOR LAT LUMBAR FUSION Right 02/05/2019   Procedure: ATTEMPTED ANTERIOR LATERAL INTERBODY FUSION;  Surgeon: Consuella Lose, MD;  Location: [place];  Service: Neurosurgery;  Laterality: Right;  RIGHT LATERAL TRANSPSOAS DISCECTOMY AND FUSION WITH ROBOTIC ASSISTED PEDICLE SCREW STABILIZATION, LUMBAR 4- LUMBAR 5   CATARACT EXTRACTION W/ INTRAOCULAR LENS  IMPLANT, BILATERAL Bilateral 2013   lens implants for cataracts after Lasik   COLONOSCOPY     ENDOVEIN HARVEST OF GREATER SAPHENOUS VEIN Left 06/29/2020   Procedure: HARVEST OF LEFT GREATER SAPHENOUS VEIN;  Surgeon: [name], MD;  Location: [place];  Service: Vascular;  Laterality: Left;   FEMORAL-POPLITEAL BYPASS GRAFT Left 06/29/2020   Procedure: BYPASS GRAFT LEFT COMMON FEMORAL TO ABOVE KNEE POPLITEAL  ARTERY;  Surgeon: [name], MD;  Location: [place];  Service: Vascular;  Laterality: Left;   HEMORRHOID SURGERY N/A 04/06/2021   Procedure: HEMORRHOIDECTOMY 2 COLUMN;  Surgeon: Ileana Roup, MD;  Location: [place];  Service: General;  Laterality: N/A;   KNEE ARTHROSCOPY Right [place] N/A 04/06/2021   Procedure: ANORECTAL EXAM UNDER ANESTHESIA/ excision of anal polyp;  Surgeon: Ileana Roup, MD;  Location: [facility];  Service: General;  Laterality: N/A;   TONSILLECTOMY     at a very young age, but grew back   TOTAL HIP ARTHROPLASTY Right 05/25/2019   Procedure: RIGHT TOTAL HIP ARTHROPLASTY ANTERIOR APPROACH;  Surgeon: Leandrew Koyanagi, MD;  Location: [place];  Service: Orthopedics;  Laterality: Right;   TOTAL KNEE ARTHROPLASTY Right 09/04/2021   Procedure: RIGHT TOTAL KNEE ARTHROPLASTY;  Surgeon: Leandrew Koyanagi, MD;  Location: [place];  Service: Orthopedics;  Laterality: Right;   Patient Active Problem List   Diagnosis Date Noted   Status post total right knee replacement 09/04/2021   Leukocytosis 10/11/2020   PAD (peripheral artery disease) ([place]) 06/29/2020   Prediabetes 04/22/2020   Chronic kidney disease, stage 3b ([employer]) 04/15/2020   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 04/12/2020   Lower urinary tract symptoms (LUTS) 04/12/2020   History of colon polyps 04/12/2020   Atherosclerosis of native arteries of extremity with  intermittent claudication ([place]) 03/01/2020   Pain in both feet 07/31/2019   Status post total replacement of right hip 05/25/2019   Lumbar radiculopathy 02/05/2019    PCP: Gena Fray  REFERRING PROVIDER: Lorelee New DIAG: S/P right TKA  THERAPY DIAG:  Stiffness of right knee, not elsewhere classified  Difficulty in walking, not elsewhere classified  Localized edema  Muscle weakness (generalized)  Rationale for Evaluation and Treatment Rehabilitation  ONSET DATE:  09/04/21  SUBJECTIVE:   SUBJECTIVE STATEMENT: "Same old same old"  PERTINENT HISTORY: Lumbar fusion 2020, THA 2021  PAIN:  Are you having pain? Yes: NPRS scale: 0/10 Pain location: right knee, calf, leg and foot Pain description: feels heavy, ache Aggravating factors: touching the calf, stretching the back of the leg pain up to 10/10 Relieving factors: rest, pain meds, ice pain can be a 1/10  PRECAUTIONS: None  WEIGHT BEARING RESTRICTIONS No  FALLS:  Has patient fallen in last 6 months? No  LIVING ENVIRONMENT: Lives with: lives with their family Lives in: House/apartment Stairs: Yes: Internal: 16 steps; can reach both one at a time Has following equipment at home: Single point cane and Walker - 2 wheeled  OCCUPATION: retired  PLOF: Independent  PATIENT GOALS work some, no pain, walk without difficulty   OBJECTIVE:  EDEMA:  Circumferential: left mid patella 37 cm, right 40.5 cm  MUSCLE LENGTH: Very tight calf and HS  POSTURE: rounded shoulders and forward head  PALPATION: Edematous right knee, mild tender, very sore calf but reports that it is much better since Tuesday  LOWER EXTREMITY ROM:  Active Passive ROM Right AROM 09/21/21 Right  PROM 09/21/21 Right  AROM 10/03/21 R knee AROM 09/3121 RT knee AROM sitting 10/26/21   Hip flexion       Hip extension       Hip abduction       Hip adduction       Hip internal rotation       Hip external rotation       Knee flexion 107 112 115 118 120  Knee extension 35 '12 20 16 6  ' Ankle dorsiflexion       Ankle plantarflexion       Ankle inversion       Ankle eversion        (Blank rows = not tested)  LOWER EXTREMITY MMT:  MMT Right eval Left eval RT knee 10/26/21  Hip flexion 3+ 4- 4+  Hip extension     Hip abduction 3+ 4-   Hip adduction     Hip internal rotation     Hip external rotation     Knee flexion 4-  4  Knee extension 3+  4+  Ankle dorsiflexion     Ankle plantarflexion     Ankle inversion      Ankle eversion      (Blank rows = not tested)   FUNCTIONAL TESTS:  Eval: Timed up and go (TUG): 24 seconds with [facility]  10/24/21 = 13 seconds no AD  GAIT: Distance walked: 100 feet Assistive device utilized: Single point cane Level of assistance: SBA Comments: mild stiffness with the knee , does not fully straighten out    TODAY'S TREATMENT: 11/02/21 Recumbent bike L2 x 6  Gait outside around  L 2 side islands  S2S holding blue ball on airex 2x10  LAQ 3# 3 sets 10 HOLD 3 sec at [facility] Knee ext 10# up with 2 eccentric lower RT  2 set 10 HS curl  RT only 15#  sets 12 Heel raises black bar 2x15  Leg Press 60lb 2x15  Vaso Rt knee with elevation   10/31/21 Recumbent bike L3.5 x 6 min LAQ 3# 3 sets 10 HOLD 3 sec at [facility] Leg Press BIL LE 50# 2 sets 10, RT LE only 20# 2 sets 10. Calf raises 50# 2 sets 10 Resisted gait 20lb 4 way x 4 each 4in step up x10 HHA x 2  Knee ext 10# up with 2 eccentric lower RT  2 set 10 HS curl RT only 15# 2 sets 10 Vaso Rt knee with elevation   10/26/21  Nustep L5 6 min LE only LAQ 3# 2 sets 10 HOLD 3 sec at [facility] 3# standing HS curl 2 sets 10 TKE blue tband 2 sets 10 standing 4 inch step up over and reverse 10x then 6 in 10 x RT LE Knee ext 10# up with 2 eccentric lower RT  2 set 10 HS curl RT only 15# 2 sets 10 Leg Press BIL LE 50# 2 sets 10, RT LE only 20# 2 sets 10. Calf raises 50# 2 sets 10 Vaso Rt knee with elevation  10/24/21 Nustep level 5 x 6 minutes Bike level 4 x 5 minutes Walk around building in the back, uneven terrain, 4 rests due to feet painl and difficulty going down hill Calf stretches Right leg 5# eccentrics, cannot raise the 5# with right only March and bounce on the minitramp On airex ball toss 3# right leg SAQ 2x10 Vaso medium pressure right knee in elevation 34 degrees F  10/19/21 NuStep L5 x 6 min  S2S on airex 2x10 Hamstring curls 25lb 2x12, RLE 15lb 2x10 Leg eccentrics RLE 10lb 2x10 LAQ RLE 3lb 2x10 4in step up x10 each  HHA x1 4in lateral step ups RLE x10 Leg press 50lb 2x10, RLE 20lb 2x10 TKE  Vaso R knee low 34deg x10 min   10/16/21 Recumbent bile L3 x 6 min  Resisted gait 20lb 2x10 Hamstring curls 25lb 2x10, RLE 15lb 2x10  5lb RLE eccentrics 2x5, Leg Ext 10lb 2x10 6in step ups x10 each Leg press 40lb 2x10, RLE 20lb 2x10 TKE  Sit to stands 2x10  Vaso R knee low 34deg x10 min  10/12/21 Recumbent bike L2.7 x 6 min  Step ups 4in x 10 each HHA x2 Hamstring curls 25lb 2x10, LLE 15lb 2x10 Leg extensions 5lb 2x10, 5lb RLE eccentrics 2x5  Leg press 30lb 2x10, RLE 20lb 2x5 S2S holding yellow ball 2x10  Resisted gaot 30lb 4 way x3 each Vaso R knee low 34deg x10 min      PATIENT EDUCATION:  Education details: reviewed his current HEP Person educated: Patient and Child(ren) Education method: Customer service manager Education comprehension: verbalized understanding   HOME EXERCISE PROGRAM: Continuation of what he is currently doing  ASSESSMENT:  CLINICAL IMPRESSION: Progression with all goals. Continued with RLE strength to improve functional mobility and independence. Added outdoor ambulation up and down slope. Some fatigue and Lateral trunk sway to R.   R quad remains weak.   OBJECTIVE IMPAIRMENTS Abnormal gait, decreased activity tolerance, decreased balance, decreased endurance, decreased mobility, difficulty walking, decreased ROM, decreased strength, increased edema, impaired flexibility, and pain.   REHAB POTENTIAL: Good  CLINICAL DECISION MAKING: Stable/uncomplicated  EVALUATION COMPLEXITY: Low   GOALS: Goals reviewed with patient? Yes  SHORT TERM GOALS: Target date:10/05/21 Independent with initial HEP Goal status: met  LONG TERM GOALS: Target date: 12/14/21  Independent with advanced HEP Goal  status: on going  2.  Decrease pain 50% Goal status: partially met  3.  Walk with minimal deviation and no device community distances Goal status: progressing  4.   Increase AROMof the right knee to 0-120 degrees flexion Goal status: progressing   5.  Increase strength of the right knee to 4+/5 Goal status: ongoing  PLAN: PT FREQUENCY: 2x/week  PT DURATION: 12 weeks  PLANNED INTERVENTIONS: Therapeutic exercises, Therapeutic activity, Neuromuscular re-education, Balance training, Gait training, Patient/Family education, Self Care, Joint mobilization, Stair training, Electrical stimulation, Cryotherapy, scar mobilization, Vasopneumatic device, and Manual therapy  PLAN FOR NEXT SESSION: continue to add proprioception and TKE ROM and strength   [name], PTA 11/02/2021, 7:55 AM [place] [place], [place], [address] Phone: [phone]   Fax:  [place]. [place], [place], [address] Phone: [phone]   Fax:  [phone]  Patient Details  Name: Asif Muchow MRN: [id number] Date of Birth: Oct 01, 1950 Referring Provider:  Haydee Salter, MD  Encounter Date: 11/02/2021   [name], PTA 11/02/2021, 7:55 AM  [place] [place], [place], [address] Phone: [phone]   Fax:  [phone]

## 2021-11-03 ENCOUNTER — Ambulatory Visit
Admission: RE | Admit: 2021-11-03 | Discharge: 2021-11-03 | Disposition: A | Discharge status: Home / Self Care | Payer: Medicare Other | Source: Ambulatory Visit | Attending: Neurology | Admitting: Neurology

## 2021-11-03 DIAGNOSIS — M79671 Pain in right foot: Secondary | ICD-10-CM

## 2021-11-03 DIAGNOSIS — M7741 Metatarsalgia, right foot: Secondary | ICD-10-CM

## 2021-11-06 ENCOUNTER — Encounter: Payer: Self-pay | Admitting: Family Medicine

## 2021-11-07 ENCOUNTER — Ambulatory Visit: Payer: Medicare Other | Admitting: Physical Therapy

## 2021-11-07 ENCOUNTER — Encounter: Payer: Self-pay | Admitting: Neurology

## 2021-11-07 ENCOUNTER — Encounter: Payer: Self-pay | Admitting: Physical Therapy

## 2021-11-07 DIAGNOSIS — R2681 Unsteadiness on feet: Secondary | ICD-10-CM

## 2021-11-07 DIAGNOSIS — R262 Difficulty in walking, not elsewhere classified: Secondary | ICD-10-CM

## 2021-11-07 DIAGNOSIS — M6281 Muscle weakness (generalized): Secondary | ICD-10-CM

## 2021-11-07 DIAGNOSIS — M25561 Pain in right knee: Secondary | ICD-10-CM | POA: Diagnosis not present

## 2021-11-07 DIAGNOSIS — M25661 Stiffness of right knee, not elsewhere classified: Secondary | ICD-10-CM

## 2021-11-07 DIAGNOSIS — R600 Localized edema: Secondary | ICD-10-CM

## 2021-11-07 NOTE — Therapy (Signed)
OUTPATIENT PHYSICAL THERAPY LOWER EXTREMITY TREATMENT    Patient Name: Gerald Carpenter MRN: [id number] DOB:[birth year], 71 y.o., male Today's Date: 11/07/2021   PT End of Session - 11/07/21 0831     Visit Number 14    Date for PT Re-Evaluation 12/22/21    PT Start Time 0832    PT Stop Time 0910    PT Time Calculation (min) 38 min    Activity Tolerance Patient tolerated treatment well    Behavior During Therapy [facility] for tasks assessed/performed              Past Medical History:  Diagnosis Date   Alcoholism ([place])    Arthritis    Cataract    Colon polyps    Complication of anesthesia    HLD (hyperlipidemia)    Nephritis    when pt. was 7 yrs. ols   Peripheral vascular disease (HCC)    PONV (postoperative nausea and vomiting)    "Only when I have aortagram on 06/23/20".   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   Substance abuse [facility])    Past Surgical History:  Procedure Laterality Date   ABDOMINAL AORTOGRAM W/LOWER EXTREMITY N/A 06/23/2020   Procedure: ABDOMINAL AORTOGRAM W/LOWER EXTREMITY;  Surgeon: [name], MD;  Location: [place] [facility];  Service: Cardiovascular;  Laterality: N/A;   ANTERIOR LAT LUMBAR FUSION Right 02/05/2019   Procedure: ATTEMPTED ANTERIOR LATERAL INTERBODY FUSION;  Surgeon: Consuella Lose, MD;  Location: [place];  Service: Neurosurgery;  Laterality: Right;  RIGHT LATERAL TRANSPSOAS DISCECTOMY AND FUSION WITH ROBOTIC ASSISTED PEDICLE SCREW STABILIZATION, LUMBAR 4- LUMBAR 5   CATARACT EXTRACTION W/ INTRAOCULAR LENS  IMPLANT, BILATERAL Bilateral 2013   lens implants for cataracts after Lasik   COLONOSCOPY     ENDOVEIN HARVEST OF GREATER SAPHENOUS VEIN Left 06/29/2020   Procedure: HARVEST OF LEFT GREATER SAPHENOUS VEIN;  Surgeon: [name], MD;  Location: [place];  Service: Vascular;  Laterality: Left;   FEMORAL-POPLITEAL BYPASS GRAFT Left 06/29/2020   Procedure: BYPASS GRAFT LEFT COMMON FEMORAL TO ABOVE KNEE POPLITEAL  ARTERY;  Surgeon: [name], MD;  Location: [place];  Service: Vascular;  Laterality: Left;   HEMORRHOID SURGERY N/A 04/06/2021   Procedure: HEMORRHOIDECTOMY 2 COLUMN;  Surgeon: Ileana Roup, MD;  Location: [place];  Service: General;  Laterality: N/A;   KNEE ARTHROSCOPY Right [place] N/A 04/06/2021   Procedure: ANORECTAL EXAM UNDER ANESTHESIA/ excision of anal polyp;  Surgeon: Ileana Roup, MD;  Location: [facility];  Service: General;  Laterality: N/A;   TONSILLECTOMY     at a very young age, but grew back   TOTAL HIP ARTHROPLASTY Right 05/25/2019   Procedure: RIGHT TOTAL HIP ARTHROPLASTY ANTERIOR APPROACH;  Surgeon: Leandrew Koyanagi, MD;  Location: [place];  Service: Orthopedics;  Laterality: Right;   TOTAL KNEE ARTHROPLASTY Right 09/04/2021   Procedure: RIGHT TOTAL KNEE ARTHROPLASTY;  Surgeon: Leandrew Koyanagi, MD;  Location: [place];  Service: Orthopedics;  Laterality: Right;   Patient Active Problem List   Diagnosis Date Noted   Status post total right knee replacement 09/04/2021   Leukocytosis 10/11/2020   PAD (peripheral artery disease) ([place]) 06/29/2020   Prediabetes 04/22/2020   Chronic kidney disease, stage 3b ([place]) 04/15/2020   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 04/12/2020   Lower urinary tract symptoms (LUTS) 04/12/2020   History of colon polyps 04/12/2020   Atherosclerosis of native arteries of extremity  with intermittent claudication ([place]) 03/01/2020   Pain in both feet 07/31/2019   Status post total replacement of right hip 05/25/2019   Lumbar radiculopathy 02/05/2019    PCP: Gena Fray  REFERRING PROVIDER: Lorelee New DIAG: S/P right TKA  THERAPY DIAG:  Stiffness of right knee, not elsewhere classified  Difficulty in walking, not elsewhere classified  Localized edema  Muscle weakness (generalized)  Acute pain of right knee  Unsteadiness on feet  Rationale for Evaluation and  Treatment Rehabilitation  ONSET DATE: 09/04/21  SUBJECTIVE:   SUBJECTIVE STATEMENT: Patient reports continued pain on B sides of knee and posterior. He also still feels like it may buckle at times. Feels much better once he starts moving.  PERTINENT HISTORY: Lumbar fusion 2020, THA 2021  PAIN:  Are you having pain? Yes: NPRS scale: 0/10 Pain location: right knee, calf, leg and foot Pain description: feels heavy, ache Aggravating factors: touching the calf, stretching the back of the leg pain up to 10/10 Relieving factors: rest, pain meds, ice pain can be a 1/10  PRECAUTIONS: None  WEIGHT BEARING RESTRICTIONS No  FALLS:  Has patient fallen in last 6 months? No  LIVING ENVIRONMENT: Lives with: lives with their family Lives in: House/apartment Stairs: Yes: Internal: 16 steps; can reach both one at a time Has following equipment at home: Single point cane and Walker - 2 wheeled  OCCUPATION: retired  PLOF: Independent  PATIENT GOALS work some, no pain, walk without difficulty   OBJECTIVE:  EDEMA:  Circumferential: left mid patella 37 cm, right 40.5 cm  MUSCLE LENGTH: Very tight calf and HS  POSTURE: rounded shoulders and forward head  PALPATION: Edematous right knee, mild tender, very sore calf but reports that it is much better since Tuesday  LOWER EXTREMITY ROM:  Active Passive ROM Right AROM 09/21/21 Right  PROM 09/21/21 Right  AROM 10/03/21 R knee AROM 09/3121 RT knee AROM sitting 10/26/21   Hip flexion       Hip extension       Hip abduction       Hip adduction       Hip internal rotation       Hip external rotation       Knee flexion 107 112 115 118 120  Knee extension 35 _0 Ankle dorsiflexion       Ankle plantarflexion       Ankle inversion       Ankle eversion        (Blank rows = not tested)  LOWER EXTREMITY MMT:  MMT Right eval Left eval RT knee 10/26/21  Hip flexion 3+ 4- 4+  Hip extension     Hip abduction 3+ 4-   Hip adduction      Hip internal rotation     Hip external rotation     Knee flexion 4-  4  Knee extension 3+  4+  Ankle dorsiflexion     Ankle plantarflexion     Ankle inversion     Ankle eversion      (Blank rows = not tested)   FUNCTIONAL TESTS:  Eval: Timed up and go (TUG): 24 seconds with [facility]  10/24/21 = 13 seconds no AD  GAIT: Distance walked: 100 feet Assistive device utilized: Single point cane Level of assistance: SBA Comments: mild stiffness with the knee , does not fully straighten out    TODAY'S TREATMENT: 11/07/21 Recumbent bike L3 x 6 minutes. Supine active knee ext with foot elevated,  5 x 5 sec R knee PROM 5-121 Supine SLR x 10, SLR with hip ER x 10 Bridge with LLE extended to emphasize R hip SAQ with 5# resistance Step ups-forward, lateral, crossover, 10 reps each with RLE, UE support STM to R ITB  11/02/21 Recumbent bike L2 x 6  Gait outside around  L 2 side islands  S2S holding blue ball on airex 2x10  LAQ 3# 3 sets 10 HOLD 3 sec at [facility] Knee ext 10# up with 2 eccentric lower RT  2 set 10 HS curl RT only 15#  sets 12 Heel raises black bar 2x15  Leg Press 60lb 2x15  Vaso Rt knee with elevation   10/31/21 Recumbent bike L3.5 x 6 min LAQ 3# 3 sets 10 HOLD 3 sec at [facility] Leg Press BIL LE 50# 2 sets 10, RT LE only 20# 2 sets 10. Calf raises 50# 2 sets 10 Resisted gait 20lb 4 way x 4 each 4in step up x10 HHA x 2  Knee ext 10# up with 2 eccentric lower RT  2 set 10 HS curl RT only 15# 2 sets 10 Vaso Rt knee with elevation   10/26/21  Nustep L5 6 min LE only LAQ 3# 2 sets 10 HOLD 3 sec at [facility] 3# standing HS curl 2 sets 10 TKE blue tband 2 sets 10 standing 4 inch step up over and reverse 10x then 6 in 10 x RT LE Knee ext 10# up with 2 eccentric lower RT  2 set 10 HS curl RT only 15# 2 sets 10 Leg Press BIL LE 50# 2 sets 10, RT LE only 20# 2 sets 10. Calf raises 50# 2 sets 10 Vaso Rt knee with elevation  10/24/21 Nustep level 5 x 6 minutes Bike level 4 x 5  minutes Walk around building in the back, uneven terrain, 4 rests due to feet painl and difficulty going down hill Calf stretches Right leg 5# eccentrics, cannot raise the 5# with right only March and bounce on the minitramp On airex ball toss 3# right leg SAQ 2x10 Vaso medium pressure right knee in elevation 34 degrees F  10/19/21 NuStep L5 x 6 min  S2S on airex 2x10 Hamstring curls 25lb 2x12, RLE 15lb 2x10 Leg eccentrics RLE 10lb 2x10 LAQ RLE 3lb 2x10 4in step up x10 each HHA x1 4in lateral step ups RLE x10 Leg press 50lb 2x10, RLE 20lb 2x10 TKE  Vaso R knee low 34deg x10 min   10/16/21 Recumbent bile L3 x 6 min  Resisted gait 20lb 2x10 Hamstring curls 25lb 2x10, RLE 15lb 2x10  5lb RLE eccentrics 2x5, Leg Ext 10lb 2x10 6in step ups x10 each Leg press 40lb 2x10, RLE 20lb 2x10 TKE  Sit to stands 2x10  Vaso R knee low 34deg x10 min  10/12/21 Recumbent bike L2.7 x 6 min  Step ups 4in x 10 each HHA x2 Hamstring curls 25lb 2x10, LLE 15lb 2x10 Leg extensions 5lb 2x10, 5lb RLE eccentrics 2x5  Leg press 30lb 2x10, RLE 20lb 2x5 S2S holding yellow ball 2x10  Resisted gaot 30lb 4 way x3 each Vaso R knee low 34deg x10 min      PATIENT EDUCATION:  Education details: reviewed his current HEP Person educated: Patient and Child(ren) Education method: Customer service manager Education comprehension: verbalized understanding   HOME EXERCISE PROGRAM: Continuation of what he is currently doing  ASSESSMENT:  CLINICAL IMPRESSION: Patient reports continued pain and feeling of weakness in R knee. His quad appears somewhat  weak. He is not performing many exercises at home. HEP created, including emphasis on isolated R knee muscle strength and then functional strenghtening.   OBJECTIVE IMPAIRMENTS Abnormal gait, decreased activity tolerance, decreased balance, decreased endurance, decreased mobility, difficulty walking, decreased ROM, decreased strength, increased edema,  impaired flexibility, and pain.   REHAB POTENTIAL: Good  CLINICAL DECISION MAKING: Stable/uncomplicated  EVALUATION COMPLEXITY: Low   GOALS: Goals reviewed with patient? Yes  SHORT TERM GOALS: Target date:10/05/21 Independent with initial HEP Goal status: met  LONG TERM GOALS: Target date: 12/14/21  Independent with advanced HEP Goal status: on going  2.  Decrease pain 50% Goal status: partially met  3.  Walk with minimal deviation and no device community distances Goal status: progressing  4.  Increase AROMof the right knee to 0-120 degrees flexion Goal status: progressing   5.  Increase strength of the right knee to 4+/5 Goal status: ongoing  PLAN: PT FREQUENCY: 2x/week  PT DURATION: 12 weeks  PLANNED INTERVENTIONS: Therapeutic exercises, Therapeutic activity, Neuromuscular re-education, Balance training, Gait training, Patient/Family education, Self Care, Joint mobilization, Stair training, Electrical stimulation, Cryotherapy, scar mobilization, Vasopneumatic device, and Manual therapy  PLAN FOR NEXT SESSION: continue to add proprioception and TKE ROM and strength   Marcelina Morel, DPT 11/07/2021, 9:33 AM [place]. [place], [place], [address] Phone: [phone]   Fax:  [place]. [place], [place], [address] Phone: [phone]   Fax:  [phone]  Patient Details  Name: Gerald Carpenter MRN: [id number] Date of Birth: [birth year] Referring Provider:  Haydee Salter, MD  Encounter Date: 11/07/2021

## 2021-11-09 ENCOUNTER — Encounter: Payer: Self-pay | Admitting: Physical Therapy

## 2021-11-09 ENCOUNTER — Ambulatory Visit: Payer: Medicare Other | Admitting: Physical Therapy

## 2021-11-09 DIAGNOSIS — M25561 Pain in right knee: Secondary | ICD-10-CM | POA: Diagnosis not present

## 2021-11-09 DIAGNOSIS — R600 Localized edema: Secondary | ICD-10-CM

## 2021-11-09 DIAGNOSIS — M25661 Stiffness of right knee, not elsewhere classified: Secondary | ICD-10-CM

## 2021-11-09 DIAGNOSIS — R262 Difficulty in walking, not elsewhere classified: Secondary | ICD-10-CM

## 2021-11-09 NOTE — Therapy (Signed)
OUTPATIENT PHYSICAL THERAPY LOWER EXTREMITY TREATMENT    Patient Name: Gerald Carpenter MRN: [id number] DOB:12/30/1950, 71 y.o., male Today's Date: 11/09/2021   PT End of Session - 11/09/21 0756     Visit Number 15    Date for PT Re-Evaluation 12/22/21    PT Start Time 0756    PT Stop Time 0845    PT Time Calculation (min) 49 min    Activity Tolerance Patient tolerated treatment well    Behavior During Therapy [facility] for tasks assessed/performed              Past Medical History:  Diagnosis Date   Alcoholism ([place])    Arthritis    Cataract    Colon polyps    Complication of anesthesia    HLD (hyperlipidemia)    Nephritis    when pt. was 7 yrs. ols   Peripheral vascular disease (HCC)    PONV (postoperative nausea and vomiting)    "Only when I have aortagram on 06/23/20".   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   Substance abuse [facility])    Past Surgical History:  Procedure Laterality Date   ABDOMINAL AORTOGRAM W/LOWER EXTREMITY N/A 06/23/2020   Procedure: ABDOMINAL AORTOGRAM W/LOWER EXTREMITY;  Surgeon: [name], MD;  Location: [place] [facility];  Service: Cardiovascular;  Laterality: N/A;   ANTERIOR LAT LUMBAR FUSION Right 02/05/2019   Procedure: ATTEMPTED ANTERIOR LATERAL INTERBODY FUSION;  Surgeon: Consuella Lose, MD;  Location: [place];  Service: Neurosurgery;  Laterality: Right;  RIGHT LATERAL TRANSPSOAS DISCECTOMY AND FUSION WITH ROBOTIC ASSISTED PEDICLE SCREW STABILIZATION, LUMBAR 4- LUMBAR 5   CATARACT EXTRACTION W/ INTRAOCULAR LENS  IMPLANT, BILATERAL Bilateral 2013   lens implants for cataracts after Lasik   COLONOSCOPY     ENDOVEIN HARVEST OF GREATER SAPHENOUS VEIN Left 06/29/2020   Procedure: HARVEST OF LEFT GREATER SAPHENOUS VEIN;  Surgeon: [name], MD;  Location: [place];  Service: Vascular;  Laterality: Left;   FEMORAL-POPLITEAL BYPASS GRAFT Left 06/29/2020   Procedure: BYPASS GRAFT LEFT COMMON FEMORAL TO ABOVE KNEE POPLITEAL  ARTERY;  Surgeon: [name], MD;  Location: [place];  Service: Vascular;  Laterality: Left;   HEMORRHOID SURGERY N/A 04/06/2021   Procedure: HEMORRHOIDECTOMY 2 COLUMN;  Surgeon: Ileana Roup, MD;  Location: [place];  Service: General;  Laterality: N/A;   KNEE ARTHROSCOPY Right [place] N/A 04/06/2021   Procedure: ANORECTAL EXAM UNDER ANESTHESIA/ excision of anal polyp;  Surgeon: Ileana Roup, MD;  Location: [facility];  Service: General;  Laterality: N/A;   TONSILLECTOMY     at a very young age, but grew back   TOTAL HIP ARTHROPLASTY Right 05/25/2019   Procedure: RIGHT TOTAL HIP ARTHROPLASTY ANTERIOR APPROACH;  Surgeon: Leandrew Koyanagi, MD;  Location: [place];  Service: Orthopedics;  Laterality: Right;   TOTAL KNEE ARTHROPLASTY Right 09/04/2021   Procedure: RIGHT TOTAL KNEE ARTHROPLASTY;  Surgeon: Leandrew Koyanagi, MD;  Location: [place];  Service: Orthopedics;  Laterality: Right;   Patient Active Problem List   Diagnosis Date Noted   Status post total right knee replacement 09/04/2021   Leukocytosis 10/11/2020   PAD (peripheral artery disease) ([place]) 06/29/2020   Prediabetes 04/22/2020   Chronic kidney disease, stage 3b ([place]) 04/15/2020   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 04/12/2020   Lower urinary tract symptoms (LUTS) 04/12/2020   History of colon polyps 04/12/2020   Atherosclerosis of native arteries of extremity  with intermittent claudication ([place]) 03/01/2020   Pain in both feet 07/31/2019   Status post total replacement of right hip 05/25/2019   Lumbar radiculopathy 02/05/2019    PCP: Gena Fray  REFERRING PROVIDER: Lorelee New DIAG: S/P right TKA  THERAPY DIAG:  Stiffness of right knee, not elsewhere classified  Difficulty in walking, not elsewhere classified  Localized edema  Rationale for Evaluation and Treatment Rehabilitation  ONSET DATE: 09/04/21  SUBJECTIVE:   SUBJECTIVE  STATEMENT: Pretty goog, able to sleep to 5 thins morning  PERTINENT HISTORY: Lumbar fusion 2020, THA 2021  PAIN:  Are you having pain? Yes: NPRS scale: 0/10 Pain location: right knee, calf, leg and foot Pain description: feels heavy, ache Aggravating factors: touching the calf, stretching the back of the leg pain up to 10/10 Relieving factors: rest, pain meds, ice pain can be a 1/10  PRECAUTIONS: None  WEIGHT BEARING RESTRICTIONS No  FALLS:  Has patient fallen in last 6 months? No  LIVING ENVIRONMENT: Lives with: lives with their family Lives in: House/apartment Stairs: Yes: Internal: 16 steps; can reach both one at a time Has following equipment at home: Single point cane and Walker - 2 wheeled  OCCUPATION: retired  PLOF: Independent  PATIENT GOALS work some, no pain, walk without difficulty   OBJECTIVE:  EDEMA:  Circumferential: left mid patella 37 cm, right 40.5 cm  MUSCLE LENGTH: Very tight calf and HS  POSTURE: rounded shoulders and forward head  PALPATION: Edematous right knee, mild tender, very sore calf but reports that it is much better since Tuesday  LOWER EXTREMITY ROM:  Active Passive ROM Right AROM 09/21/21 Right  PROM 09/21/21 Right  AROM 10/03/21 R knee AROM 09/3121 RT knee AROM sitting 10/26/21   Hip flexion       Hip extension       Hip abduction       Hip adduction       Hip internal rotation       Hip external rotation       Knee flexion 107 112 115 118 120  Knee extension 35 '12 20 16 6  ' Ankle dorsiflexion       Ankle plantarflexion       Ankle inversion       Ankle eversion        (Blank rows = not tested)  LOWER EXTREMITY MMT:  MMT Right eval Left eval RT knee 10/26/21  Hip flexion 3+ 4- 4+  Hip extension     Hip abduction 3+ 4-   Hip adduction     Hip internal rotation     Hip external rotation     Knee flexion 4-  4  Knee extension 3+  4+  Ankle dorsiflexion     Ankle plantarflexion     Ankle inversion     Ankle eversion       (Blank rows = not tested)   FUNCTIONAL TESTS:  Eval: Timed up and go (TUG): 24 seconds with [facility]  10/24/21 = 13 seconds no AD  GAIT: Distance walked: 100 feet Assistive device utilized: Single point cane Level of assistance: SBA Comments: mild stiffness with the knee , does not fully straighten out    TODAY'S TREATMENT:  11/09/21 Recumbent bike L3 x 6 min 6in step ups x10 each Heel raises 2x15 Hamstring Curls 25lb 2x15, RLE 15lb x10 Leg Ext 10lb 2x15, RLE 5lb x5, RLE 15lb Eccentrics  Leg press 40lb 3x15  S2S on airex holding blue ball 2x10  Vaso Rt knee with elevation  11/07/21 Recumbent bike L3 x 6 minutes. Supine active knee ext with foot elevated, 5 x 5 sec R knee PROM 5-121 Supine SLR x 10, SLR with hip ER x 10 Bridge with LLE extended to emphasize R hip SAQ with 5# resistance Step ups-forward, lateral, crossover, 10 reps each with RLE, UE support STM to R ITB  11/02/21 Recumbent bike L2 x 6  Gait outside around  L 2 side islands  S2S holding blue ball on airex 2x10  LAQ 3# 3 sets 10 HOLD 3 sec at [facility] Knee ext 10# up with 2 eccentric lower RT  2 set 10 HS curl RT only 15#  sets 12 Heel raises black bar 2x15  Leg Press 60lb 2x15  Vaso Rt knee with elevation   10/31/21 Recumbent bike L3.5 x 6 min LAQ 3# 3 sets 10 HOLD 3 sec at TKE Leg Press BIL LE 50# 2 sets 10, RT LE only 20# 2 sets 10. Calf raises 50# 2 sets 10 Resisted gait 20lb 4 way x 4 each 4in step up x10 HHA x 2  Knee ext 10# up with 2 eccentric lower RT  2 set 10 HS curl RT only 15# 2 sets 10 Vaso Rt knee with elevation   10/26/21  Nustep L5 6 min LE only LAQ 3# 2 sets 10 HOLD 3 sec at [facility] 3# standing HS curl 2 sets 10 TKE blue tband 2 sets 10 standing 4 inch step up over and reverse 10x then 6 in 10 x RT LE Knee ext 10# up with 2 eccentric lower RT  2 set 10 HS curl RT only 15# 2 sets 10 Leg Press BIL LE 50# 2 sets 10, RT LE only 20# 2 sets 10. Calf raises 50# 2 sets 10 Vaso Rt knee with  elevation  10/24/21 Nustep level 5 x 6 minutes Bike level 4 x 5 minutes Walk around building in the back, uneven terrain, 4 rests due to feet painl and difficulty going down hill Calf stretches Right leg 5# eccentrics, cannot raise the 5# with right only March and bounce on the minitramp On airex ball toss 3# right leg SAQ 2x10 Vaso medium pressure right knee in elevation 34 degrees F  10/19/21 NuStep L5 x 6 min  S2S on airex 2x10 Hamstring curls 25lb 2x12, RLE 15lb 2x10 Leg eccentrics RLE 10lb 2x10 LAQ RLE 3lb 2x10 4in step up x10 each HHA x1 4in lateral step ups RLE x10 Leg press 50lb 2x10, RLE 20lb 2x10 TKE  Vaso R knee low 34deg x10 min   10/16/21 Recumbent bile L3 x 6 min  Resisted gait 20lb 2x10 Hamstring curls 25lb 2x10, RLE 15lb 2x10  5lb RLE eccentrics 2x5, Leg Ext 10lb 2x10 6in step ups x10 each Leg press 40lb 2x10, RLE 20lb 2x10 TKE  Sit to stands 2x10  Vaso R knee low 34deg x10 min  10/12/21 Recumbent bike L2.7 x 6 min  Step ups 4in x 10 each HHA x2 Hamstring curls 25lb 2x10, LLE 15lb 2x10 Leg extensions 5lb 2x10, 5lb RLE eccentrics 2x5  Leg press 30lb 2x10, RLE 20lb 2x5 S2S holding yellow ball 2x10  Resisted gaot 30lb 4 way x3 each Vaso R knee low 34deg x10 min      PATIENT EDUCATION:  Education details: reviewed his current HEP Person educated: Patient and Child(ren) Education method: Customer service manager Education comprehension: verbalized understanding   HOME EXERCISE PROGRAM: Continuation of what he is currently doing  ASSESSMENT:  CLINICAL IMPRESSION:  Quad remains weak, cue needed not to pull up with UE's doing step ups. Mote work load on machine did cause some increase fatigue.    OBJECTIVE IMPAIRMENTS Abnormal gait, decreased activity tolerance, decreased balance, decreased endurance, decreased mobility, difficulty walking, decreased ROM, decreased strength, increased edema, impaired flexibility, and pain.   REHAB POTENTIAL:  Good  CLINICAL DECISION MAKING: Stable/uncomplicated  EVALUATION COMPLEXITY: Low   GOALS: Goals reviewed with patient? Yes  SHORT TERM GOALS: Target date:10/05/21 Independent with initial HEP Goal status: met  LONG TERM GOALS: Target date: 12/14/21  Independent with advanced HEP Goal status: on going  2.  Decrease pain 50% Goal status: partially met  3.  Walk with minimal deviation and no device community distances Goal status: progressing  4.  Increase AROMof the right knee to 0-120 degrees flexion Goal status: progressing   5.  Increase strength of the right knee to 4+/5 Goal status: ongoing  PLAN: PT FREQUENCY: 2x/week  PT DURATION: 12 weeks  PLANNED INTERVENTIONS: Therapeutic exercises, Therapeutic activity, Neuromuscular re-education, Balance training, Gait training, Patient/Family education, Self Care, Joint mobilization, Stair training, Electrical stimulation, Cryotherapy, scar mobilization, Vasopneumatic device, and Manual therapy  PLAN FOR NEXT SESSION: continue to add proprioception and TKE ROM and strength   Cheri Fowler, PTA

## 2021-11-14 ENCOUNTER — Encounter: Payer: Self-pay | Admitting: Physical Therapy

## 2021-11-14 ENCOUNTER — Ambulatory Visit: Payer: Medicare Other | Admitting: Physical Therapy

## 2021-11-14 ENCOUNTER — Telehealth: Payer: Self-pay | Admitting: *Deleted

## 2021-11-14 DIAGNOSIS — R262 Difficulty in walking, not elsewhere classified: Secondary | ICD-10-CM

## 2021-11-14 DIAGNOSIS — M6281 Muscle weakness (generalized): Secondary | ICD-10-CM

## 2021-11-14 DIAGNOSIS — R600 Localized edema: Secondary | ICD-10-CM

## 2021-11-14 DIAGNOSIS — M25561 Pain in right knee: Secondary | ICD-10-CM | POA: Diagnosis not present

## 2021-11-14 DIAGNOSIS — M25661 Stiffness of right knee, not elsewhere classified: Secondary | ICD-10-CM

## 2021-11-14 NOTE — Therapy (Signed)
OUTPATIENT PHYSICAL THERAPY LOWER EXTREMITY TREATMENT    Patient Name: Gerald Carpenter MRN: [id number] DOB:[birth year], 71 y.o., male Today's Date: 11/14/2021   PT End of Session - 11/14/21 0754     Visit Number 16    Date for PT Re-Evaluation 12/22/21    PT Start Time 0755    PT Stop Time 0845    PT Time Calculation (min) 50 min    Activity Tolerance Patient tolerated treatment well    Behavior During Therapy [facility] for tasks assessed/performed              Past Medical History:  Diagnosis Date   Alcoholism ([place])    Arthritis    Cataract    Colon polyps    Complication of anesthesia    HLD (hyperlipidemia)    Nephritis    when pt. was 7 yrs. ols   Peripheral vascular disease (HCC)    PONV (postoperative nausea and vomiting)    "Only when I have aortagram on 06/23/20".   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   Substance abuse [facility])    Past Surgical History:  Procedure Laterality Date   ABDOMINAL AORTOGRAM W/LOWER EXTREMITY N/A 06/23/2020   Procedure: ABDOMINAL AORTOGRAM W/LOWER EXTREMITY;  Surgeon: [name], MD;  Location: [place] [facility];  Service: Cardiovascular;  Laterality: N/A;   ANTERIOR LAT LUMBAR FUSION Right 02/05/2019   Procedure: ATTEMPTED ANTERIOR LATERAL INTERBODY FUSION;  Surgeon: Consuella Lose, MD;  Location: [place];  Service: Neurosurgery;  Laterality: Right;  RIGHT LATERAL TRANSPSOAS DISCECTOMY AND FUSION WITH ROBOTIC ASSISTED PEDICLE SCREW STABILIZATION, LUMBAR 4- LUMBAR 5   CATARACT EXTRACTION W/ INTRAOCULAR LENS  IMPLANT, BILATERAL Bilateral 2013   lens implants for cataracts after Lasik   COLONOSCOPY     ENDOVEIN HARVEST OF GREATER SAPHENOUS VEIN Left 06/29/2020   Procedure: HARVEST OF LEFT GREATER SAPHENOUS VEIN;  Surgeon: [name], MD;  Location: [place];  Service: Vascular;  Laterality: Left;   FEMORAL-POPLITEAL BYPASS GRAFT Left 06/29/2020   Procedure: BYPASS GRAFT LEFT COMMON FEMORAL TO ABOVE KNEE POPLITEAL  ARTERY;  Surgeon: [name], MD;  Location: [place];  Service: Vascular;  Laterality: Left;   HEMORRHOID SURGERY N/A 04/06/2021   Procedure: HEMORRHOIDECTOMY 2 COLUMN;  Surgeon: Ileana Roup, MD;  Location: [place];  Service: General;  Laterality: N/A;   KNEE ARTHROSCOPY Right [place] N/A 04/06/2021   Procedure: ANORECTAL EXAM UNDER ANESTHESIA/ excision of anal polyp;  Surgeon: Ileana Roup, MD;  Location: [facility];  Service: General;  Laterality: N/A;   TONSILLECTOMY     at a very young age, but grew back   TOTAL HIP ARTHROPLASTY Right 05/25/2019   Procedure: RIGHT TOTAL HIP ARTHROPLASTY ANTERIOR APPROACH;  Surgeon: Leandrew Koyanagi, MD;  Location: [place];  Service: Orthopedics;  Laterality: Right;   TOTAL KNEE ARTHROPLASTY Right 09/04/2021   Procedure: RIGHT TOTAL KNEE ARTHROPLASTY;  Surgeon: Leandrew Koyanagi, MD;  Location: [place];  Service: Orthopedics;  Laterality: Right;   Patient Active Problem List   Diagnosis Date Noted   Status post total right knee replacement 09/04/2021   Leukocytosis 10/11/2020   PAD (peripheral artery disease) ([place]) 06/29/2020   Prediabetes 04/22/2020   Chronic kidney disease, stage 3b ([place]) 04/15/2020   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 04/12/2020   Lower urinary tract symptoms (LUTS) 04/12/2020   History of colon polyps 04/12/2020   Atherosclerosis of native arteries of extremity  with intermittent claudication ([place]) 03/01/2020   Pain in both feet 07/31/2019   Status post total replacement of right hip 05/25/2019   Lumbar radiculopathy 02/05/2019    PCP: Gena Fray  REFERRING PROVIDER: Lorelee New DIAG: S/P right TKA  THERAPY DIAG:  Stiffness of right knee, not elsewhere classified  Localized edema  Difficulty in walking, not elsewhere classified  Muscle weakness (generalized)  Rationale for Evaluation and Treatment Rehabilitation  ONSET DATE:  09/04/21  SUBJECTIVE:   SUBJECTIVE STATEMENT: "Doing ok" "The joint between the knee is killing me, I don't know if I slept wrong last night"  PERTINENT HISTORY: Lumbar fusion 2020, THA 2021  PAIN:  Are you having pain? Yes: NPRS scale: 5/10 Pain location: right knee, calf, leg and foot Pain description: feels heavy, ache Aggravating factors: touching the calf, stretching the back of the leg pain up to 10/10 Relieving factors: rest, pain meds, ice pain can be a 1/10  PRECAUTIONS: None  WEIGHT BEARING RESTRICTIONS No  FALLS:  Has patient fallen in last 6 months? No  LIVING ENVIRONMENT: Lives with: lives with their family Lives in: House/apartment Stairs: Yes: Internal: 16 steps; can reach both one at a time Has following equipment at home: Single point cane and Walker - 2 wheeled  OCCUPATION: retired  PLOF: Independent  PATIENT GOALS work some, no pain, walk without difficulty   OBJECTIVE:  EDEMA:  Circumferential: left mid patella 37 cm, right 40.5 cm  MUSCLE LENGTH: Very tight calf and HS  POSTURE: rounded shoulders and forward head  PALPATION: Edematous right knee, mild tender, very sore calf but reports that it is much better since Tuesday  LOWER EXTREMITY ROM:  Active Passive ROM Right AROM 09/21/21 Right  PROM 09/21/21 Right  AROM 10/03/21 R knee AROM 09/3121 RT knee AROM sitting 10/26/21   Hip flexion       Hip extension       Hip abduction       Hip adduction       Hip internal rotation       Hip external rotation       Knee flexion 107 112 115 118 120  Knee extension 35 _0 Ankle dorsiflexion       Ankle plantarflexion       Ankle inversion       Ankle eversion        (Blank rows = not tested)  LOWER EXTREMITY MMT:  MMT Right eval Left eval RT knee 10/26/21  Hip flexion 3+ 4- 4+  Hip extension     Hip abduction 3+ 4-   Hip adduction     Hip internal rotation     Hip external rotation     Knee flexion 4-  4  Knee extension 3+  4+   Ankle dorsiflexion     Ankle plantarflexion     Ankle inversion     Ankle eversion      (Blank rows = not tested)   FUNCTIONAL TESTS:  Eval: Timed up and go (TUG): 24 seconds with [facility]  10/24/21 = 13 seconds no AD  GAIT: Distance walked: 100 feet Assistive device utilized: Single point cane Level of assistance: SBA Comments: mild stiffness with the knee , does not fully straighten out    TODAY'S TREATMENT: 11/14/21 Recumbent bike L3 x 6 min Leg press 50lb 3x10, RLE 20lb 3x5 Stairs 15, 4in alt pattern, 1 rail R LAQ RLE 5lb 2x15 Hamstring curls RLE 15lb 2x15  Leg  Ext RLE 15lb Eccentrics 2x15 4in side steps x10 each Vaso R knee med, 32 deg x10  11/09/21 Recumbent bike L3 x 6 min 6in step ups x10 each Heel raises 2x15 Hamstring Curls 25lb 2x15, RLE 15lb x10 Leg Ext 10lb 2x15, RLE 5lb x5, RLE 15lb Eccentrics  Leg press 40lb 3x15  S2S on airex holding blue ball 2x10  Vaso Rt knee with elevation  11/07/21 Recumbent bike L3 x 6 minutes. Supine active knee ext with foot elevated, 5 x 5 sec R knee PROM 5-121 Supine SLR x 10, SLR with hip ER x 10 Bridge with LLE extended to emphasize R hip SAQ with 5# resistance Step ups-forward, lateral, crossover, 10 reps each with RLE, UE support STM to R ITB  11/02/21 Recumbent bike L2 x 6  Gait outside around  L 2 side islands  S2S holding blue ball on airex 2x10  LAQ 3# 3 sets 10 HOLD 3 sec at [facility] Knee ext 10# up with 2 eccentric lower RT  2 set 10 HS curl RT only 15#  sets 12 Heel raises black bar 2x15  Leg Press 60lb 2x15  Vaso Rt knee with elevation   10/31/21 Recumbent bike L3.5 x 6 min LAQ 3# 3 sets 10 HOLD 3 sec at TKE Leg Press BIL LE 50# 2 sets 10, RT LE only 20# 2 sets 10. Calf raises 50# 2 sets 10 Resisted gait 20lb 4 way x 4 each 4in step up x10 HHA x 2  Knee ext 10# up with 2 eccentric lower RT  2 set 10 HS curl RT only 15# 2 sets 10 Vaso Rt knee with elevation   10/26/21  Nustep L5 6 min LE only LAQ 3# 2 sets  10 HOLD 3 sec at [facility] 3# standing HS curl 2 sets 10 TKE blue tband 2 sets 10 standing 4 inch step up over and reverse 10x then 6 in 10 x RT LE Knee ext 10# up with 2 eccentric lower RT  2 set 10 HS curl RT only 15# 2 sets 10 Leg Press BIL LE 50# 2 sets 10, RT LE only 20# 2 sets 10. Calf raises 50# 2 sets 10 Vaso Rt knee with elevation  10/24/21 Nustep level 5 x 6 minutes Bike level 4 x 5 minutes Walk around building in the back, uneven terrain, 4 rests due to feet painl and difficulty going down hill Calf stretches Right leg 5# eccentrics, cannot raise the 5# with right only March and bounce on the minitramp On airex ball toss 3# right leg SAQ 2x10 Vaso medium pressure right knee in elevation 34 degrees F  10/19/21 NuStep L5 x 6 min  S2S on airex 2x10 Hamstring curls 25lb 2x12, RLE 15lb 2x10 Leg eccentrics RLE 10lb 2x10 LAQ RLE 3lb 2x10 4in step up x10 each HHA x1 4in lateral step ups RLE x10 Leg press 50lb 2x10, RLE 20lb 2x10 TKE  Vaso R knee low 34deg x10 min     PATIENT EDUCATION:  Education details: reviewed his current HEP Person educated: Patient and Child(ren) Education method: Customer service manager Education comprehension: verbalized understanding   HOME EXERCISE PROGRAM: Continuation of what he is currently doing  ASSESSMENT:  CLINICAL IMPRESSION:  Quad remains weak, Some Discomfort controlling stair descents with RLE. Some assist required at times with SL on leg press.  OBJECTIVE IMPAIRMENTS Abnormal gait, decreased activity tolerance, decreased balance, decreased endurance, decreased mobility, difficulty walking, decreased ROM, decreased strength, increased edema, impaired flexibility, and pain.  REHAB POTENTIAL: Good  CLINICAL DECISION MAKING: Stable/uncomplicated  EVALUATION COMPLEXITY: Low   GOALS: Goals reviewed with patient? Yes  SHORT TERM GOALS: Target date:10/05/21 Independent with initial HEP Goal status: met  LONG TERM GOALS:  Target date: 12/14/21  Independent with advanced HEP Goal status: on going  2.  Decrease pain 50% Goal status: partially met  3.  Walk with minimal deviation and no device community distances Goal status: progressing  4.  Increase AROMof the right knee to 0-120 degrees flexion Goal status: progressing   5.  Increase strength of the right knee to 4+/5 Goal status: ongoing  PLAN: PT FREQUENCY: 2x/week  PT DURATION: 12 weeks  PLANNED INTERVENTIONS: Therapeutic exercises, Therapeutic activity, Neuromuscular re-education, Balance training, Gait training, Patient/Family education, Self Care, Joint mobilization, Stair training, Electrical stimulation, Cryotherapy, scar mobilization, Vasopneumatic device, and Manual therapy  PLAN FOR NEXT SESSION: continue to add proprioception and TKE ROM and strength   Cheri Fowler, PTA

## 2021-11-14 NOTE — Telephone Encounter (Signed)
Ortho bundle 30 day call completed. °

## 2021-11-16 ENCOUNTER — Encounter: Payer: Self-pay | Admitting: Physical Therapy

## 2021-11-16 ENCOUNTER — Ambulatory Visit: Payer: Medicare Other | Admitting: Physical Therapy

## 2021-11-16 DIAGNOSIS — M25661 Stiffness of right knee, not elsewhere classified: Secondary | ICD-10-CM

## 2021-11-16 DIAGNOSIS — M25561 Pain in right knee: Secondary | ICD-10-CM | POA: Diagnosis not present

## 2021-11-16 DIAGNOSIS — R262 Difficulty in walking, not elsewhere classified: Secondary | ICD-10-CM

## 2021-11-16 DIAGNOSIS — R600 Localized edema: Secondary | ICD-10-CM

## 2021-11-16 DIAGNOSIS — M6281 Muscle weakness (generalized): Secondary | ICD-10-CM

## 2021-11-16 NOTE — Therapy (Signed)
OUTPATIENT PHYSICAL THERAPY LOWER EXTREMITY TREATMENT    Patient Name: Gerald Carpenter MRN: [id number] DOB:09/20/1950, 71 y.o., male Today's Date: 11/16/2021   PT End of Session - 11/16/21 0801     Visit Number 17    Date for PT Re-Evaluation 12/22/21    PT Start Time 0800    PT Stop Time 0845    PT Time Calculation (min) 45 min    Activity Tolerance Patient tolerated treatment well    Behavior During Therapy [facility] for tasks assessed/performed              Past Medical History:  Diagnosis Date   Alcoholism ([place])    Arthritis    Cataract    Colon polyps    Complication of anesthesia    HLD (hyperlipidemia)    Nephritis    when pt. was 7 yrs. ols   Peripheral vascular disease (HCC)    PONV (postoperative nausea and vomiting)    "Only when I have aortagram on 06/23/20".   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   Substance abuse [facility])    Past Surgical History:  Procedure Laterality Date   ABDOMINAL AORTOGRAM W/LOWER EXTREMITY N/A 06/23/2020   Procedure: ABDOMINAL AORTOGRAM W/LOWER EXTREMITY;  Surgeon: [name], MD;  Location: [place] [facility];  Service: Cardiovascular;  Laterality: N/A;   ANTERIOR LAT LUMBAR FUSION Right 02/05/2019   Procedure: ATTEMPTED ANTERIOR LATERAL INTERBODY FUSION;  Surgeon: Consuella Lose, MD;  Location: [place];  Service: Neurosurgery;  Laterality: Right;  RIGHT LATERAL TRANSPSOAS DISCECTOMY AND FUSION WITH ROBOTIC ASSISTED PEDICLE SCREW STABILIZATION, LUMBAR 4- LUMBAR 5   CATARACT EXTRACTION W/ INTRAOCULAR LENS  IMPLANT, BILATERAL Bilateral 2013   lens implants for cataracts after Lasik   COLONOSCOPY     ENDOVEIN HARVEST OF GREATER SAPHENOUS VEIN Left 06/29/2020   Procedure: HARVEST OF LEFT GREATER SAPHENOUS VEIN;  Surgeon: [name], MD;  Location: [place];  Service: Vascular;  Laterality: Left;   FEMORAL-POPLITEAL BYPASS GRAFT Left 06/29/2020   Procedure: BYPASS GRAFT LEFT COMMON FEMORAL TO ABOVE KNEE POPLITEAL  ARTERY;  Surgeon: [name], MD;  Location: [place];  Service: Vascular;  Laterality: Left;   HEMORRHOID SURGERY N/A 04/06/2021   Procedure: HEMORRHOIDECTOMY 2 COLUMN;  Surgeon: Ileana Roup, MD;  Location: [place];  Service: General;  Laterality: N/A;   KNEE ARTHROSCOPY Right [place] N/A 04/06/2021   Procedure: ANORECTAL EXAM UNDER ANESTHESIA/ excision of anal polyp;  Surgeon: Ileana Roup, MD;  Location: [facility];  Service: General;  Laterality: N/A;   TONSILLECTOMY     at a very young age, but grew back   TOTAL HIP ARTHROPLASTY Right 05/25/2019   Procedure: RIGHT TOTAL HIP ARTHROPLASTY ANTERIOR APPROACH;  Surgeon: Leandrew Koyanagi, MD;  Location: [place];  Service: Orthopedics;  Laterality: Right;   TOTAL KNEE ARTHROPLASTY Right 09/04/2021   Procedure: RIGHT TOTAL KNEE ARTHROPLASTY;  Surgeon: Leandrew Koyanagi, MD;  Location: [place];  Service: Orthopedics;  Laterality: Right;   Patient Active Problem List   Diagnosis Date Noted   Status post total right knee replacement 09/04/2021   Leukocytosis 10/11/2020   PAD (peripheral artery disease) ([place]) 06/29/2020   Prediabetes 04/22/2020   Chronic kidney disease, stage 3b ([place]) 04/15/2020   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 04/12/2020   Lower urinary tract symptoms (LUTS) 04/12/2020   History of colon polyps 04/12/2020   Atherosclerosis of native arteries of extremity  with intermittent claudication ([place]) 03/01/2020   Pain in both feet 07/31/2019   Status post total replacement of right hip 05/25/2019   Lumbar radiculopathy 02/05/2019    PCP: Gena Fray  REFERRING PROVIDER: Lorelee New DIAG: S/P right TKA  THERAPY DIAG:  Stiffness of right knee, not elsewhere classified  Difficulty in walking, not elsewhere classified  Localized edema  Muscle weakness (generalized)  Rationale for Evaluation and Treatment Rehabilitation  ONSET DATE:  09/04/21  SUBJECTIVE:   SUBJECTIVE STATEMENT: "Today I feel great"  PERTINENT HISTORY: Lumbar fusion 2020, THA 2021  PAIN:  Are you having pain? Yes: NPRS scale: 1/10 Pain location: right knee, calf, leg and foot Pain description: feels heavy, ache Aggravating factors: touching the calf, stretching the back of the leg pain up to 10/10 Relieving factors: rest, pain meds, ice pain can be a 1/10  PRECAUTIONS: None  WEIGHT BEARING RESTRICTIONS No  FALLS:  Has patient fallen in last 6 months? No  LIVING ENVIRONMENT: Lives with: lives with their family Lives in: House/apartment Stairs: Yes: Internal: 16 steps; can reach both one at a time Has following equipment at home: Single point cane and Walker - 2 wheeled  OCCUPATION: retired  PLOF: [place] work some, no pain, walk without difficulty   OBJECTIVE:  EDEMA:  Circumferential: left mid patella 37 cm, right 40.5 cm  MUSCLE LENGTH: Very tight calf and HS  POSTURE: rounded shoulders and forward head  PALPATION: Edematous right knee, mild tender, very sore calf but reports that it is much better since Tuesday  LOWER EXTREMITY ROM:  Active Passive ROM Right AROM 09/21/21 Right  PROM 09/21/21 Right  AROM 10/03/21 R knee AROM 09/3121 RT knee AROM sitting 10/26/21   Hip flexion       Hip extension       Hip abduction       Hip adduction       Hip internal rotation       Hip external rotation       Knee flexion 107 112 115 118 120  Knee extension 35 '12 20 16 6  ' Ankle dorsiflexion       Ankle plantarflexion       Ankle inversion       Ankle eversion        (Blank rows = not tested)  LOWER EXTREMITY MMT:  MMT Right eval Left eval RT knee 10/26/21 RT Knee 11/16/21  Hip flexion 3+ 4- 4+ 4+  Hip extension      Hip abduction 3+ 4-    Hip adduction      Hip internal rotation      Hip external rotation      Knee flexion 4-  4 4  Knee extension 3+  4+ 4+  Ankle dorsiflexion      Ankle  plantarflexion      Ankle inversion      Ankle eversion       (Blank rows = not tested)   FUNCTIONAL TESTS:  Eval: Timed up and go (TUG): 24 seconds with [facility]  10/24/21 = 13 seconds no AD  GAIT: Distance walked: 100 feet Assistive device utilized: Single point cane Level of assistance: SBA Comments: mild stiffness with the knee , does not fully straighten out    TODAY'S TREATMENT: 11/16/21 NuStep L4 x6 min Stairs alt pattern 2 rails x15 4 & 6 inch S2S LE on airex holding blue ball 2x12 6in step ups x10 each no UE RLE LAQ 5lb  2x15   6in lateral step ups x10 each 60lb leg press 3x10 Vaso Rt knee with elevation  11/14/21 Recumbent bike L3 x 6 min Leg press 50lb 3x10, RLE 20lb 3x5 Stairs 15, 4in alt pattern, 1 rail R LAQ RLE 5lb 2x15 Hamstring curls RLE 15lb 2x15  Leg Ext RLE 15lb Eccentrics 2x15 4in side steps x10 each Vaso R knee med, 32 deg x10  11/09/21 Recumbent bike L3 x 6 min 6in step ups x10 each Heel raises 2x15 Hamstring Curls 25lb 2x15, RLE 15lb x10 Leg Ext 10lb 2x15, RLE 5lb x5, RLE 15lb Eccentrics  Leg press 40lb 3x15  S2S on airex holding blue ball 2x10  Vaso Rt knee with elevation  11/07/21 Recumbent bike L3 x 6 minutes. Supine active knee ext with foot elevated, 5 x 5 sec R knee PROM 5-121 Supine SLR x 10, SLR with hip ER x 10 Bridge with LLE extended to emphasize R hip SAQ with 5# resistance Step ups-forward, lateral, crossover, 10 reps each with RLE, UE support STM to R ITB  11/02/21 Recumbent bike L2 x 6  Gait outside around  L 2 side islands  S2S holding blue ball on airex 2x10  LAQ 3# 3 sets 10 HOLD 3 sec at [facility] Knee ext 10# up with 2 eccentric lower RT  2 set 10 HS curl RT only 15#  sets 12 Heel raises black bar 2x15  Leg Press 60lb 2x15  Vaso Rt knee with elevation   10/31/21 Recumbent bike L3.5 x 6 min LAQ 3# 3 sets 10 HOLD 3 sec at TKE Leg Press BIL LE 50# 2 sets 10, RT LE only 20# 2 sets 10. Calf raises 50# 2 sets 10 Resisted gait  20lb 4 way x 4 each 4in step up x10 HHA x 2  Knee ext 10# up with 2 eccentric lower RT  2 set 10 HS curl RT only 15# 2 sets 10 Vaso Rt knee with elevation     PATIENT EDUCATION:  Education details: reviewed his current HEP Person educated: Patient and Child(ren) Education method: Customer service manager Education comprehension: verbalized understanding   HOME EXERCISE PROGRAM: Continuation of what he is currently doing  ASSESSMENT:  CLINICAL IMPRESSION:  Quad remains weak, but reports less pain overall. He was able to complete step ups without UE. Some compensation noted with lateral step ups. Cue to hold contraction with LAQ. Increase resistance tolerated with leg press.  OBJECTIVE IMPAIRMENTS Abnormal gait, decreased activity tolerance, decreased balance, decreased endurance, decreased mobility, difficulty walking, decreased ROM, decreased strength, increased edema, impaired flexibility, and pain.   REHAB POTENTIAL: Good  CLINICAL DECISION MAKING: Stable/uncomplicated  EVALUATION COMPLEXITY: Low   GOALS: Goals reviewed with patient? Yes  SHORT TERM GOALS: Target date:10/05/21 Independent with initial HEP Goal status: met  LONG TERM GOALS: Target date: 12/14/21  Independent with advanced HEP Goal status: on going  2.  Decrease pain 50% Goal status: partially met  3.  Walk with minimal deviation and no device community distances Goal status: progressing  4.  Increase AROMof the right knee to 0-120 degrees flexion Goal status: progressing   5.  Increase strength of the right knee to 4+/5 Goal status: ongoing  PLAN: PT FREQUENCY: 2x/week  PT DURATION: 12 weeks  PLANNED INTERVENTIONS: Therapeutic exercises, Therapeutic activity, Neuromuscular re-education, Balance training, Gait training, Patient/Family education, Self Care, Joint mobilization, Stair training, Electrical stimulation, Cryotherapy, scar mobilization, Vasopneumatic device, and Manual  therapy  PLAN FOR NEXT SESSION: continue to add proprioception  and TKE ROM and strength   Cheri Fowler, PTA

## 2021-11-17 ENCOUNTER — Telehealth: Payer: Self-pay | Admitting: *Deleted

## 2021-11-17 NOTE — Chronic Care Management (AMB) (Signed)
  Care Coordination   Note   11/17/2021 Name: Milferd Ansell MRN: [id number] DOB: 02-Oct-1950  Sanel Stemmer is a 71 y.o. year old male who sees Rudd, Lillette Boxer, MD for primary care. I reached out to [name] by phone today to offer care coordination services.  Mr. Lukins was given information about Care Coordination services today including:   The Care Coordination services include support from the care team which includes your Nurse Coordinator, Clinical Social Worker, or Pharmacist.  The Care Coordination team is here to help remove barriers to the health concerns and goals most important to you. Care Coordination services are voluntary, and the patient may decline or stop services at any time by request to their care team member.   Care Coordination Consent Status: Patient agreed to services and verbal consent obtained.   Follow up plan:  Telephone appointment with care coordination team member scheduled for:  12/01/21  Encounter Outcome:  Pt. Scheduled  [place]  Direct Dial: [phone]

## 2021-11-21 ENCOUNTER — Ambulatory Visit: Payer: Medicare Other | Attending: Orthopaedic Surgery | Admitting: Physical Therapy

## 2021-11-21 ENCOUNTER — Encounter: Payer: Self-pay | Admitting: Physical Therapy

## 2021-11-21 DIAGNOSIS — M25661 Stiffness of right knee, not elsewhere classified: Secondary | ICD-10-CM | POA: Diagnosis not present

## 2021-11-21 DIAGNOSIS — M25561 Pain in right knee: Secondary | ICD-10-CM | POA: Diagnosis present

## 2021-11-21 DIAGNOSIS — R600 Localized edema: Secondary | ICD-10-CM | POA: Insufficient documentation

## 2021-11-21 DIAGNOSIS — M6281 Muscle weakness (generalized): Secondary | ICD-10-CM | POA: Insufficient documentation

## 2021-11-21 DIAGNOSIS — R262 Difficulty in walking, not elsewhere classified: Secondary | ICD-10-CM | POA: Diagnosis present

## 2021-11-21 NOTE — Therapy (Signed)
OUTPATIENT PHYSICAL THERAPY LOWER EXTREMITY TREATMENT    Patient Name: Gerald Carpenter MRN: [id number] DOB:18-Feb-1951, 71 y.o., male Today's Date: 11/21/2021   PT End of Session - 11/21/21 0753     Visit Number 18    Date for PT Re-Evaluation 12/22/21    PT Start Time 0753    PT Stop Time 0835    PT Time Calculation (min) 42 min    Activity Tolerance Patient tolerated treatment well    Behavior During Therapy [facility] for tasks assessed/performed              Past Medical History:  Diagnosis Date   Alcoholism ([place])    Arthritis    Cataract    Colon polyps    Complication of anesthesia    HLD (hyperlipidemia)    Nephritis    when pt. was 7 yrs. ols   Peripheral vascular disease (HCC)    PONV (postoperative nausea and vomiting)    "Only when I have aortagram on 06/23/20".   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   Substance abuse [facility])    Past Surgical History:  Procedure Laterality Date   ABDOMINAL AORTOGRAM W/LOWER EXTREMITY N/A 06/23/2020   Procedure: ABDOMINAL AORTOGRAM W/LOWER EXTREMITY;  Surgeon: [name], MD;  Location: [place] [facility];  Service: Cardiovascular;  Laterality: N/A;   ANTERIOR LAT LUMBAR FUSION Right 02/05/2019   Procedure: ATTEMPTED ANTERIOR LATERAL INTERBODY FUSION;  Surgeon: Consuella Lose, MD;  Location: [place];  Service: Neurosurgery;  Laterality: Right;  RIGHT LATERAL TRANSPSOAS DISCECTOMY AND FUSION WITH ROBOTIC ASSISTED PEDICLE SCREW STABILIZATION, LUMBAR 4- LUMBAR 5   CATARACT EXTRACTION W/ INTRAOCULAR LENS  IMPLANT, BILATERAL Bilateral 2013   lens implants for cataracts after Lasik   COLONOSCOPY     ENDOVEIN HARVEST OF GREATER SAPHENOUS VEIN Left 06/29/2020   Procedure: HARVEST OF LEFT GREATER SAPHENOUS VEIN;  Surgeon: [name], MD;  Location: [place];  Service: Vascular;  Laterality: Left;   FEMORAL-POPLITEAL BYPASS GRAFT Left 06/29/2020   Procedure: BYPASS GRAFT LEFT COMMON FEMORAL TO ABOVE KNEE POPLITEAL  ARTERY;  Surgeon: [name], MD;  Location: [place];  Service: Vascular;  Laterality: Left;   HEMORRHOID SURGERY N/A 04/06/2021   Procedure: HEMORRHOIDECTOMY 2 COLUMN;  Surgeon: Ileana Roup, MD;  Location: [place];  Service: General;  Laterality: N/A;   KNEE ARTHROSCOPY Right [place] N/A 04/06/2021   Procedure: ANORECTAL EXAM UNDER ANESTHESIA/ excision of anal polyp;  Surgeon: Ileana Roup, MD;  Location: [facility];  Service: General;  Laterality: N/A;   TONSILLECTOMY     at a very young age, but grew back   TOTAL HIP ARTHROPLASTY Right 05/25/2019   Procedure: RIGHT TOTAL HIP ARTHROPLASTY ANTERIOR APPROACH;  Surgeon: Leandrew Koyanagi, MD;  Location: [place];  Service: Orthopedics;  Laterality: Right;   TOTAL KNEE ARTHROPLASTY Right 09/04/2021   Procedure: RIGHT TOTAL KNEE ARTHROPLASTY;  Surgeon: Leandrew Koyanagi, MD;  Location: [place];  Service: Orthopedics;  Laterality: Right;   Patient Active Problem List   Diagnosis Date Noted   Status post total right knee replacement 09/04/2021   Leukocytosis 10/11/2020   PAD (peripheral artery disease) ([place]) 06/29/2020   Prediabetes 04/22/2020   Chronic kidney disease, stage 3b ([place]) 04/15/2020   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 04/12/2020   Lower urinary tract symptoms (LUTS) 04/12/2020   History of colon polyps 04/12/2020   Atherosclerosis of native arteries of extremity  with intermittent claudication ([place]) 03/01/2020   Pain in both feet 07/31/2019   Status post total replacement of right hip 05/25/2019   Lumbar radiculopathy 02/05/2019    PCP: Gena Fray  REFERRING PROVIDER: Lorelee New DIAG: S/P right TKA  THERAPY DIAG:  Stiffness of right knee, not elsewhere classified  Difficulty in walking, not elsewhere classified  Localized edema  Muscle weakness (generalized)  Rationale for Evaluation and Treatment Rehabilitation  ONSET DATE:  09/04/21  SUBJECTIVE:   SUBJECTIVE STATEMENT: "Just stiff"  PERTINENT HISTORY: Lumbar fusion 2020, THA 2021  PAIN:  Are you having pain? Yes: NPRS scale: 1/10 Pain location: right knee, calf, leg and foot Pain description: feels heavy, ache Aggravating factors: touching the calf, stretching the back of the leg pain up to 10/10 Relieving factors: rest, pain meds, ice pain can be a 1/10  PRECAUTIONS: None  WEIGHT BEARING RESTRICTIONS No  FALLS:  Has patient fallen in last 6 months? No  LIVING ENVIRONMENT: Lives with: lives with their family Lives in: House/apartment Stairs: Yes: Internal: 16 steps; can reach both one at a time Has following equipment at home: Single point cane and Walker - 2 wheeled  OCCUPATION: retired  PLOF: [place] work some, no pain, walk without difficulty   OBJECTIVE:  EDEMA:  Circumferential: left mid patella 37 cm, right 40.5 cm  MUSCLE LENGTH: Very tight calf and HS  POSTURE: rounded shoulders and forward head  PALPATION: Edematous right knee, mild tender, very sore calf but reports that it is much better since Tuesday  LOWER EXTREMITY ROM:  Active Passive ROM Right AROM 09/21/21 Right  PROM 09/21/21 Right  AROM 10/03/21 R knee AROM 09/3121 RT knee AROM sitting 10/26/21   Hip flexion       Hip extension       Hip abduction       Hip adduction       Hip internal rotation       Hip external rotation       Knee flexion 107 112 115 118 120  Knee extension 35 _0 Ankle dorsiflexion       Ankle plantarflexion       Ankle inversion       Ankle eversion        (Blank rows = not tested)  LOWER EXTREMITY MMT:  MMT Right eval Left eval RT knee 10/26/21 RT Knee 11/16/21  Hip flexion 3+ 4- 4+ 4+  Hip extension      Hip abduction 3+ 4-    Hip adduction      Hip internal rotation      Hip external rotation      Knee flexion 4-  4 4  Knee extension 3+  4+ 4+  Ankle dorsiflexion      Ankle plantarflexion       Ankle inversion      Ankle eversion       (Blank rows = not tested)   FUNCTIONAL TESTS:  Eval: Timed up and go (TUG): 24 seconds with [facility]  10/24/21 = 13 seconds no AD  GAIT: Distance walked: 100 feet Assistive device utilized: Single point cane Level of assistance: SBA Comments: mild stiffness with the knee , does not fully straighten out    TODAY'S TREATMENT:  11/21/21 Bike L3.5 x6 Hamstring curls RLE 20lb 2x10  Leg Ext 5lb x9 then x6 RLE LAQ 5lb 3x10  Resisted gait 30lb 4 way x4 each Heel raises 2x15 6in step ups  x10 each Vaso R knee x1 min 34 deg  11/16/21 NuStep L4 x6 min Stairs alt pattern 2 rails x15 4 & 6 inch S2S LE on airex holding blue ball 2x12 6in step ups x10 each no UE RLE LAQ 5lb 2x15   6in lateral step ups x10 each 60lb leg press 3x10 Vaso Rt knee with elevation  11/14/21 Recumbent bike L3 x 6 min Leg press 50lb 3x10, RLE 20lb 3x5 Stairs 15, 4in alt pattern, 1 rail R LAQ RLE 5lb 2x15 Hamstring curls RLE 15lb 2x15  Leg Ext RLE 15lb Eccentrics 2x15 4in side steps x10 each Vaso R knee med, 32 deg x10  11/09/21 Recumbent bike L3 x 6 min 6in step ups x10 each Heel raises 2x15 Hamstring Curls 25lb 2x15, RLE 15lb x10 Leg Ext 10lb 2x15, RLE 5lb x5, RLE 15lb Eccentrics  Leg press 40lb 3x15  S2S on airex holding blue ball 2x10  Vaso Rt knee with elevation  11/07/21 Recumbent bike L3 x 6 minutes. Supine active knee ext with foot elevated, 5 x 5 sec R knee PROM 5-121 Supine SLR x 10, SLR with hip ER x 10 Bridge with LLE extended to emphasize R hip SAQ with 5# resistance Step ups-forward, lateral, crossover, 10 reps each with RLE, UE support STM to R ITB   PATIENT EDUCATION:  Education details: reviewed his current HEP Person educated: Patient and Child(ren) Education method: Customer service manager Education comprehension: verbalized understanding   HOME EXERCISE PROGRAM: Continuation of what he is currently  doing  ASSESSMENT:  CLINICAL IMPRESSION:  Quad remains weak, but reports less pain overall. Good carryover form last session completing step ups without UE. Pt has also progressed completed leg extension reps with machine. Cue to hold contraction with LAQ.  OBJECTIVE IMPAIRMENTS Abnormal gait, decreased activity tolerance, decreased balance, decreased endurance, decreased mobility, difficulty walking, decreased ROM, decreased strength, increased edema, impaired flexibility, and pain.   REHAB POTENTIAL: Good  CLINICAL DECISION MAKING: Stable/uncomplicated  EVALUATION COMPLEXITY: Low   GOALS: Goals reviewed with patient? Yes  SHORT TERM GOALS: Target date:10/05/21 Independent with initial HEP Goal status: met  LONG TERM GOALS: Target date: 12/14/21  Independent with advanced HEP Goal status: on going  2.  Decrease pain 50% Goal status: partially met  3.  Walk with minimal deviation and no device community distances Goal status: progressing  4.  Increase AROMof the right knee to 0-120 degrees flexion Goal status: progressing   5.  Increase strength of the right knee to 4+/5 Goal status: ongoing  PLAN: PT FREQUENCY: 2x/week  PT DURATION: 12 weeks  PLANNED INTERVENTIONS: Therapeutic exercises, Therapeutic activity, Neuromuscular re-education, Balance training, Gait training, Patient/Family education, Self Care, Joint mobilization, Stair training, Electrical stimulation, Cryotherapy, scar mobilization, Vasopneumatic device, and Manual therapy  PLAN FOR NEXT SESSION: continue to add proprioception and TKE ROM and strength   Cheri Fowler, PTA

## 2021-11-24 ENCOUNTER — Encounter: Payer: Self-pay | Admitting: Physical Therapy

## 2021-11-24 ENCOUNTER — Ambulatory Visit: Payer: Medicare Other | Admitting: Physical Therapy

## 2021-11-24 DIAGNOSIS — R262 Difficulty in walking, not elsewhere classified: Secondary | ICD-10-CM

## 2021-11-24 DIAGNOSIS — M25561 Pain in right knee: Secondary | ICD-10-CM

## 2021-11-24 DIAGNOSIS — M25661 Stiffness of right knee, not elsewhere classified: Secondary | ICD-10-CM | POA: Diagnosis not present

## 2021-11-24 DIAGNOSIS — R600 Localized edema: Secondary | ICD-10-CM

## 2021-11-24 DIAGNOSIS — M6281 Muscle weakness (generalized): Secondary | ICD-10-CM

## 2021-11-24 NOTE — Therapy (Signed)
OUTPATIENT PHYSICAL THERAPY LOWER EXTREMITY TREATMENT    Patient Name: Gerald Carpenter MRN: [id number] DOB:1951/02/15, 71 y.o., male Today's Date: 11/24/2021   PT End of Session - 11/24/21 0759     Visit Number 19    Date for PT Re-Evaluation 12/22/21    PT Start Time 0758    PT Stop Time 0845    PT Time Calculation (min) 47 min    Activity Tolerance Patient tolerated treatment well    Behavior During Therapy [facility] for tasks assessed/performed              Past Medical History:  Diagnosis Date   Alcoholism ([place])    Arthritis    Cataract    Colon polyps    Complication of anesthesia    HLD (hyperlipidemia)    Nephritis    when pt. was 7 yrs. ols   Peripheral vascular disease (HCC)    PONV (postoperative nausea and vomiting)    "Only when I have aortagram on 06/23/20".   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   Substance abuse [facility])    Past Surgical History:  Procedure Laterality Date   ABDOMINAL AORTOGRAM W/LOWER EXTREMITY N/A 06/23/2020   Procedure: ABDOMINAL AORTOGRAM W/LOWER EXTREMITY;  Surgeon: [name], MD;  Location: [place] [facility];  Service: Cardiovascular;  Laterality: N/A;   ANTERIOR LAT LUMBAR FUSION Right 02/05/2019   Procedure: ATTEMPTED ANTERIOR LATERAL INTERBODY FUSION;  Surgeon: Consuella Lose, MD;  Location: [place];  Service: Neurosurgery;  Laterality: Right;  RIGHT LATERAL TRANSPSOAS DISCECTOMY AND FUSION WITH ROBOTIC ASSISTED PEDICLE SCREW STABILIZATION, LUMBAR 4- LUMBAR 5   CATARACT EXTRACTION W/ INTRAOCULAR LENS  IMPLANT, BILATERAL Bilateral 2013   lens implants for cataracts after Lasik   COLONOSCOPY     ENDOVEIN HARVEST OF GREATER SAPHENOUS VEIN Left 06/29/2020   Procedure: HARVEST OF LEFT GREATER SAPHENOUS VEIN;  Surgeon: [name], MD;  Location: [place];  Service: Vascular;  Laterality: Left;   FEMORAL-POPLITEAL BYPASS GRAFT Left 06/29/2020   Procedure: BYPASS GRAFT LEFT COMMON FEMORAL TO ABOVE KNEE POPLITEAL  ARTERY;  Surgeon: [name], MD;  Location: [place];  Service: Vascular;  Laterality: Left;   HEMORRHOID SURGERY N/A 04/06/2021   Procedure: HEMORRHOIDECTOMY 2 COLUMN;  Surgeon: Ileana Roup, MD;  Location: [place];  Service: General;  Laterality: N/A;   KNEE ARTHROSCOPY Right [place] N/A 04/06/2021   Procedure: ANORECTAL EXAM UNDER ANESTHESIA/ excision of anal polyp;  Surgeon: Ileana Roup, MD;  Location: [facility];  Service: General;  Laterality: N/A;   TONSILLECTOMY     at a very young age, but grew back   TOTAL HIP ARTHROPLASTY Right 05/25/2019   Procedure: RIGHT TOTAL HIP ARTHROPLASTY ANTERIOR APPROACH;  Surgeon: Leandrew Koyanagi, MD;  Location: [place];  Service: Orthopedics;  Laterality: Right;   TOTAL KNEE ARTHROPLASTY Right 09/04/2021   Procedure: RIGHT TOTAL KNEE ARTHROPLASTY;  Surgeon: Leandrew Koyanagi, MD;  Location: [place];  Service: Orthopedics;  Laterality: Right;   Patient Active Problem List   Diagnosis Date Noted   Status post total right knee replacement 09/04/2021   Leukocytosis 10/11/2020   PAD (peripheral artery disease) ([place]) 06/29/2020   Prediabetes 04/22/2020   Chronic kidney disease, stage 3b ([place]) 04/15/2020   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 04/12/2020   Lower urinary tract symptoms (LUTS) 04/12/2020   History of colon polyps 04/12/2020   Atherosclerosis of native arteries of extremity  with intermittent claudication ([place]) 03/01/2020   Pain in both feet 07/31/2019   Status post total replacement of right hip 05/25/2019   Lumbar radiculopathy 02/05/2019    PCP: Gena Fray  REFERRING PROVIDER: Lorelee New DIAG: S/P right TKA  THERAPY DIAG:  Stiffness of right knee, not elsewhere classified  Difficulty in walking, not elsewhere classified  Muscle weakness (generalized)  Localized edema  Acute pain of right knee  Rationale for Evaluation and Treatment  Rehabilitation  ONSET DATE: 09/04/21  SUBJECTIVE:   SUBJECTIVE STATEMENT: "Just Tight"  PERTINENT HISTORY: Lumbar fusion 2020, THA 2021  PAIN:  Are you having pain? Yes: NPRS scale: 1/10 Pain location: right knee, calf, leg and foot Pain description: feels heavy, ache Aggravating factors: touching the calf, stretching the back of the leg pain up to 10/10 Relieving factors: rest, pain meds, ice pain can be a 1/10  PRECAUTIONS: None  WEIGHT BEARING RESTRICTIONS No  FALLS:  Has patient fallen in last 6 months? No  LIVING ENVIRONMENT: Lives with: lives with their family Lives in: House/apartment Stairs: Yes: Internal: 16 steps; can reach both one at a time Has following equipment at home: Single point cane and Walker - 2 wheeled  OCCUPATION: retired  PLOF: Independent  PATIENT GOALS work some, no pain, walk without difficulty   OBJECTIVE:  EDEMA:  Circumferential: left mid patella 37 cm, right 40.5 cm  MUSCLE LENGTH: Very tight calf and HS  POSTURE: rounded shoulders and forward head  PALPATION: Edematous right knee, mild tender, very sore calf but reports that it is much better since Tuesday  LOWER EXTREMITY ROM:  Active Passive ROM Right AROM 09/21/21 Right  PROM 09/21/21 Right  AROM 10/03/21 R knee AROM 09/3121 RT knee AROM sitting 10/26/21   Hip flexion       Hip extension       Hip abduction       Hip adduction       Hip internal rotation       Hip external rotation       Knee flexion 107 112 115 118 120  Knee extension 35 '12 20 16 6  ' Ankle dorsiflexion       Ankle plantarflexion       Ankle inversion       Ankle eversion        (Blank rows = not tested)  LOWER EXTREMITY MMT:  MMT Right eval Left eval RT knee 10/26/21 RT Knee 11/16/21 R knee  Hip flexion 3+ 4- 4+ 4+ 4+  Hip extension       Hip abduction 3+ 4-     Hip adduction       Hip internal rotation       Hip external rotation       Knee flexion 4-  4 4 4+  Knee extension 3+  4+ 4+ 4+   Ankle dorsiflexion       Ankle plantarflexion       Ankle inversion       Ankle eversion        (Blank rows = not tested)   FUNCTIONAL TESTS:  Eval: Timed up and go (TUG): 24 seconds with [facility]  10/24/21 = 13 seconds no AD  GAIT: Distance walked: 100 feet Assistive device utilized: Single point cane Level of assistance: SBA Comments: mild stiffness with the knee , does not fully straighten out    TODAY'S TREATMENT:  11/24/21 Bike L3.3 x 6 min  Leg press 50lb 2x15, RLE 20lb 3x10  LAQ RLE 5lb 2x10 HS curls RLE blue x10 green x10  Forward and lateral step ups 6in x10 each side  Heel raises 2x15 Resisted side steps 40b x5 each Vaso R knee x1 min 34 deg   11/21/21 Bike L3.5 x6 Hamstring curls RLE 20lb 2x10  Leg Ext 5lb x9 then x6 RLE LAQ 5lb 3x10  Resisted gait 30lb 4 way x4 each Heel raises 2x15 6in step ups x10 each Vaso R knee x1 min 34 deg  11/16/21 NuStep L4 x6 min Stairs alt pattern 2 rails x15 4 & 6 inch S2S LE on airex holding blue ball 2x12 6in step ups x10 each no UE RLE LAQ 5lb 2x15   6in lateral step ups x10 each 60lb leg press 3x10 Vaso Rt knee with elevation  11/14/21 Recumbent bike L3 x 6 min Leg press 50lb 3x10, RLE 20lb 3x5 Stairs 15, 4in alt pattern, 1 rail R LAQ RLE 5lb 2x15 Hamstring curls RLE 15lb 2x15  Leg Ext RLE 15lb Eccentrics 2x15 4in side steps x10 each Vaso R knee med, 32 deg x10  11/09/21 Recumbent bike L3 x 6 min 6in step ups x10 each Heel raises 2x15 Hamstring Curls 25lb 2x15, RLE 15lb x10 Leg Ext 10lb 2x15, RLE 5lb x5, RLE 15lb Eccentrics  Leg press 40lb 3x15  S2S on airex holding blue ball 2x10  Vaso Rt knee with elevation     PATIENT EDUCATION:  Education details: reviewed his current HEP Person educated: Patient and Child(ren) Education method: Customer service manager Education comprehension: verbalized understanding   HOME EXERCISE PROGRAM: Continuation of what he is currently  doing  ASSESSMENT:  CLINICAL IMPRESSION:  Quad is functionally weak despite good MMT. Good carryover form last session completing step ups without UE, but some compensation noted. Cue to hold contraction with LAQ. Pt had good control with resisted side step   OBJECTIVE IMPAIRMENTS Abnormal gait, decreased activity tolerance, decreased balance, decreased endurance, decreased mobility, difficulty walking, decreased ROM, decreased strength, increased edema, impaired flexibility, and pain.   REHAB POTENTIAL: Good  CLINICAL DECISION MAKING: Stable/uncomplicated  EVALUATION COMPLEXITY: Low   GOALS: Goals reviewed with patient? Yes  SHORT TERM GOALS: Target date:10/05/21 Independent with initial HEP Goal status: met  LONG TERM GOALS: Target date: 12/14/21  Independent with advanced HEP Goal status: on going  2.  Decrease pain 50% Goal status: Met 11/24/21  3.  Walk with minimal deviation and no device community distances Goal status: progressing  4.  Increase AROMof the right knee to 0-120 degrees flexion Goal status: progressing   5.  Increase strength of the right knee to 4+/5 Goal status: Met 11/24/21  PLAN: PT FREQUENCY: 2x/week  PT DURATION: 12 weeks  PLANNED INTERVENTIONS: Therapeutic exercises, Therapeutic activity, Neuromuscular re-education, Balance training, Gait training, Patient/Family education, Self Care, Joint mobilization, Stair training, Electrical stimulation, Cryotherapy, scar mobilization, Vasopneumatic device, and Manual therapy  PLAN FOR NEXT SESSION: continue to add proprioception and TKE ROM and strength   Cheri Fowler, PTA

## 2021-11-28 ENCOUNTER — Ambulatory Visit: Payer: Medicare Other | Admitting: Physical Therapy

## 2021-11-28 ENCOUNTER — Encounter: Payer: Self-pay | Admitting: Physical Therapy

## 2021-11-28 DIAGNOSIS — M25661 Stiffness of right knee, not elsewhere classified: Secondary | ICD-10-CM | POA: Diagnosis not present

## 2021-11-28 DIAGNOSIS — R600 Localized edema: Secondary | ICD-10-CM

## 2021-11-28 DIAGNOSIS — R262 Difficulty in walking, not elsewhere classified: Secondary | ICD-10-CM

## 2021-11-28 DIAGNOSIS — M6281 Muscle weakness (generalized): Secondary | ICD-10-CM

## 2021-11-28 NOTE — Therapy (Signed)
OUTPATIENT PHYSICAL THERAPY LOWER EXTREMITY TREATMENT Progress Note Reporting Period 10/26/21 to 11/28/21 for visits 11-20  See note below for Objective Data and Assessment of Progress/Goals.       Patient Name: Rowan Blaker MRN: [id number] DOB:10/31/1950, 71 y.o., male Today's Date: 11/28/2021   PT End of Session - 11/28/21 0756     Visit Number 20    Date for PT Re-Evaluation 12/22/21    Authorization Type Medicare    PT Start Time 0756    PT Stop Time 0845    PT Time Calculation (min) 49 min    Activity Tolerance Patient tolerated treatment well    Behavior During Therapy [facility] for tasks assessed/performed              Past Medical History:  Diagnosis Date   Alcoholism ([place])    Arthritis    Cataract    Colon polyps    Complication of anesthesia    HLD (hyperlipidemia)    Nephritis    when pt. was 7 yrs. ols   Peripheral vascular disease (HCC)    PONV (postoperative nausea and vomiting)    "Only when I have aortagram on 06/23/20".   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   Substance abuse [facility])    Past Surgical History:  Procedure Laterality Date   ABDOMINAL AORTOGRAM W/LOWER EXTREMITY N/A 06/23/2020   Procedure: ABDOMINAL AORTOGRAM W/LOWER EXTREMITY;  Surgeon: [name], MD;  Location: [place] [facility];  Service: Cardiovascular;  Laterality: N/A;   ANTERIOR LAT LUMBAR FUSION Right 02/05/2019   Procedure: ATTEMPTED ANTERIOR LATERAL INTERBODY FUSION;  Surgeon: Consuella Lose, MD;  Location: [place];  Service: Neurosurgery;  Laterality: Right;  RIGHT LATERAL TRANSPSOAS DISCECTOMY AND FUSION WITH ROBOTIC ASSISTED PEDICLE SCREW STABILIZATION, LUMBAR 4- LUMBAR 5   CATARACT EXTRACTION W/ INTRAOCULAR LENS  IMPLANT, BILATERAL Bilateral 2013   lens implants for cataracts after Lasik   COLONOSCOPY     ENDOVEIN HARVEST OF GREATER SAPHENOUS VEIN Left 06/29/2020   Procedure: HARVEST OF LEFT GREATER SAPHENOUS VEIN;  Surgeon: [name], MD;   Location: [place];  Service: Vascular;  Laterality: Left;   FEMORAL-POPLITEAL BYPASS GRAFT Left 06/29/2020   Procedure: BYPASS GRAFT LEFT COMMON FEMORAL TO ABOVE KNEE POPLITEAL ARTERY;  Surgeon: [name], MD;  Location: [place];  Service: Vascular;  Laterality: Left;   HEMORRHOID SURGERY N/A 04/06/2021   Procedure: HEMORRHOIDECTOMY 2 COLUMN;  Surgeon: Ileana Roup, MD;  Location: [place];  Service: General;  Laterality: N/A;   KNEE ARTHROSCOPY Right [place] N/A 04/06/2021   Procedure: ANORECTAL EXAM UNDER ANESTHESIA/ excision of anal polyp;  Surgeon: Ileana Roup, MD;  Location: [facility];  Service: General;  Laterality: N/A;   TONSILLECTOMY     at a very young age, but grew back   TOTAL HIP ARTHROPLASTY Right 05/25/2019   Procedure: RIGHT TOTAL HIP ARTHROPLASTY ANTERIOR APPROACH;  Surgeon: Leandrew Koyanagi, MD;  Location: [place];  Service: Orthopedics;  Laterality: Right;   TOTAL KNEE ARTHROPLASTY Right 09/04/2021   Procedure: RIGHT TOTAL KNEE ARTHROPLASTY;  Surgeon: Leandrew Koyanagi, MD;  Location: [place];  Service: Orthopedics;  Laterality: Right;   Patient Active Problem List   Diagnosis Date Noted   Status post total right knee replacement 09/04/2021   Leukocytosis 10/11/2020   PAD (peripheral artery disease) ([place]) 06/29/2020   Prediabetes 04/22/2020   Chronic kidney disease, stage 3b ([place]) 04/15/2020  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 04/12/2020   Lower urinary tract symptoms (LUTS) 04/12/2020   History of colon polyps 04/12/2020   Atherosclerosis of native arteries of extremity with intermittent claudication ([place]) 03/01/2020   Pain in both feet 07/31/2019   Status post total replacement of right hip 05/25/2019   Lumbar radiculopathy 02/05/2019    PCP: Gena Fray  REFERRING PROVIDER: Lorelee New DIAG: S/P right TKA  THERAPY DIAG:  Stiffness of right knee, not elsewhere  classified  Difficulty in walking, not elsewhere classified  Muscle weakness (generalized)  Localized edema  Rationale for Evaluation and Treatment Rehabilitation  ONSET DATE: 09/04/21  SUBJECTIVE:   SUBJECTIVE STATEMENT: "The rubber bands killed me last time, It just et up yesterday"  PERTINENT HISTORY: Lumbar fusion 2020, THA 2021  PAIN:  Are you having pain? Yes: NPRS scale: 1/10 Pain location: right knee, calf, leg and foot Pain description: feels heavy, ache Aggravating factors: touching the calf, stretching the back of the leg pain up to 10/10 Relieving factors: rest, pain meds, ice pain can be a 1/10  PRECAUTIONS: None  WEIGHT BEARING RESTRICTIONS No  FALLS:  Has patient fallen in last 6 months? No  LIVING ENVIRONMENT: Lives with: lives with their family Lives in: House/apartment Stairs: Yes: Internal: 16 steps; can reach both one at a time Has following equipment at home: Single point cane and Walker - 2 wheeled  OCCUPATION: retired  PLOF: Independent  PATIENT GOALS work some, no pain, walk without difficulty   OBJECTIVE:  EDEMA:  Circumferential: left mid patella 37 cm, right 40.5 cm  MUSCLE LENGTH: Very tight calf and HS  POSTURE: rounded shoulders and forward head  PALPATION: Edematous right knee, mild tender, very sore calf but reports that it is much better since Tuesday  LOWER EXTREMITY ROM:  Active Passive ROM Right AROM 09/21/21 Right  PROM 09/21/21 Right  AROM 10/03/21 R knee AROM 09/3121 RT knee AROM sitting 10/26/21 R knee 11/28/21   Hip flexion        Hip extension        Hip abduction        Hip adduction        Hip internal rotation        Hip external rotation        Knee flexion 107 112 115 118 120 122  Knee extension 35 _0 Ankle dorsiflexion        Ankle plantarflexion        Ankle inversion        Ankle eversion         (Blank rows = not tested)  LOWER EXTREMITY MMT:  MMT Right eval Left eval RT knee  10/26/21 RT Knee 11/16/21 R knee R knee 11/28/21  Hip flexion 3+ 4- 4+ 4+ 4+   Hip extension        Hip abduction 3+ 4-      Hip adduction        Hip internal rotation        Hip external rotation        Knee flexion 4-  4 4 4+ 4+  Knee extension 3+  4+ 4+ 4+ 4+  Ankle dorsiflexion        Ankle plantarflexion        Ankle inversion        Ankle eversion         (Blank rows = not tested)   FUNCTIONAL TESTS:  Eval:  Timed up and go (TUG): 24 seconds with [facility]  10/24/21 = 13 seconds no AD  GAIT: Distance walked: 100 feet Assistive device utilized: Single point cane Level of assistance: SBA Comments: mild stiffness with the knee , does not fully straighten out    TODAY'S TREATMENT: 11/28/21 Bike L4 x 6 min  S2S holding 10lb dumbbell 2x10 Hamstring curls 25lb 2x12 Leg Ext 10lb 2x10 6in step ups x 10 each Heel raises black bar 2x15 6in lateral step ups x 10 each  4in anterior step downs x10 RLE 4in eccentric step downs 2x10 Leg press 60lb 2x15  Vaso R knee x1 min 34 deg   11/24/21 Bike L3.3 x 6 min  Leg press 50lb 2x15, RLE 20lb 3x10 LAQ RLE 5lb 2x10 HS curls RLE blue x10 green x10  Forward and lateral step ups 6in x10 each side  Heel raises 2x15 Resisted side steps 40b x5 each Vaso R knee x1 min 34 deg   11/21/21 Bike L3.5 x6 Hamstring curls RLE 20lb 2x10  Leg Ext 5lb x9 then x6 RLE LAQ 5lb 3x10  Resisted gait 30lb 4 way x4 each Heel raises 2x15 6in step ups x10 each Vaso R knee x1 min 34 deg  11/16/21 NuStep L4 x6 min Stairs alt pattern 2 rails x15 4 & 6 inch S2S LE on airex holding blue ball 2x12 6in step ups x10 each no UE RLE LAQ 5lb 2x15   6in lateral step ups x10 each 60lb leg press 3x10 Vaso Rt knee with elevation  11/14/21 Recumbent bike L3 x 6 min Leg press 50lb 3x10, RLE 20lb 3x5 Stairs 15, 4in alt pattern, 1 rail R LAQ RLE 5lb 2x15 Hamstring curls RLE 15lb 2x15  Leg Ext RLE 15lb Eccentrics 2x15 4in side steps x10 each Vaso R knee med, 32 deg  x10  11/09/21 Recumbent bike L3 x 6 min 6in step ups x10 each Heel raises 2x15 Hamstring Curls 25lb 2x15, RLE 15lb x10 Leg Ext 10lb 2x15, RLE 5lb x5, RLE 15lb Eccentrics  Leg press 40lb 3x15  S2S on airex holding blue ball 2x10  Vaso Rt knee with elevation     PATIENT EDUCATION:  Education details: reviewed his current HEP Person educated: Patient and Child(ren) Education method: Customer service manager Education comprehension: verbalized understanding   HOME EXERCISE PROGRAM: Continuation of what he is currently doing  ASSESSMENT:  CLINICAL IMPRESSION:  Quad is functionally weak despite good MMT.  Pt has progressed increasing his R knee active extension. Compensation still required with step up interventions in order to complete. Pt had a difficult time with eccentric step downs due to weakness.  OBJECTIVE IMPAIRMENTS Abnormal gait, decreased activity tolerance, decreased balance, decreased endurance, decreased mobility, difficulty walking, decreased ROM, decreased strength, increased edema, impaired flexibility, and pain.   REHAB POTENTIAL: Good  CLINICAL DECISION MAKING: Stable/uncomplicated  EVALUATION COMPLEXITY: Low   GOALS: Goals reviewed with patient? Yes  SHORT TERM GOALS: Target date:10/05/21 Independent with initial HEP Goal status: met  LONG TERM GOALS: Target date: 12/14/21  Independent with advanced HEP Goal status: Met 11/28/21  2.  Decrease pain 50% Goal status: Met 11/24/21  3.  Walk with minimal deviation and no device community distances Goal status: progressing  4.  Increase AROMof the right knee to 0-120 degrees flexion Goal status: progressing 11/28/21  5.  Increase strength of the right knee to 4+/5 Goal status: Met 11/24/21  PLAN: PT FREQUENCY: 2x/week  PT DURATION: 12 weeks  PLANNED INTERVENTIONS: Therapeutic exercises, Therapeutic  activity, Neuromuscular re-education, Balance training, Gait training, Patient/Family  education, Self Care, Joint mobilization, Stair training, Electrical stimulation, Cryotherapy, scar mobilization, Vasopneumatic device, and Manual therapy  PLAN FOR NEXT SESSION: continue to add proprioception and TKE ROM and strength   Cheri Fowler, PTA

## 2021-11-29 ENCOUNTER — Ambulatory Visit (INDEPENDENT_AMBULATORY_CARE_PROVIDER_SITE_OTHER): Payer: Medicare Other | Admitting: Physician Assistant

## 2021-11-29 ENCOUNTER — Telehealth: Payer: Self-pay | Admitting: *Deleted

## 2021-11-29 ENCOUNTER — Encounter: Payer: Self-pay | Admitting: Family Medicine

## 2021-11-29 DIAGNOSIS — Z96651 Presence of right artificial knee joint: Secondary | ICD-10-CM

## 2021-11-29 NOTE — Progress Notes (Signed)
Post-Op Visit Note   Patient: Gerald Carpenter           Date of Birth: 1950-05-12           MRN: [id number] Visit Date: 11/29/2021 PCP: Haydee Salter, MD   Assessment & Plan:  Chief Complaint:  Chief Complaint  Patient presents with   Right Knee - Follow-up    Right TKA 09/04/21   Visit Diagnoses:  1. Status post total right knee replacement     Plan: Patient is a pleasant 71 year old gentleman who comes in today 3 months status post right total knee replacement 09/04/2021.  He has been doing well.  He notes slight discomfort and stiffness on the days of physical therapy and the day after for which she takes pain pill.  Otherwise, no pain or complaints.  Examination of his right knee reveals range of motion from 0 to 125 degrees.  He is stable valgus varus stress.  He is neurovascular intact distally.  At this point, I am okay if he discontinues outpatient physical therapy and just works on a home exercise program.  Dental prophylaxis reinforced.  Follow-up with [place] in 3 months for repeat evaluation and 2 view x-rays of the right knee.  Call with concerns or questions in the meantime.  Follow-Up Instructions: Return in about 3 months (around 03/01/2022).   Orders:  No orders of the defined types were placed in this encounter.  No orders of the defined types were placed in this encounter.   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 was 7 yrs. ols   Peripheral vascular disease (H[facility])    PONV (postoperative nausea and vomiting)    "Only when I have aortagram on 06/23/20".   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   Substance abuse ([place])     Family History  Problem Relation Age of Onset   Pulmonary fibrosis Mother    Breast cancer Mother    Hyperlipidemia Mother    Hypertension Mother    Other Mother        Scarlet Fever   Heart disease Father    Hyperlipidemia Father    Diabetes Brother    Hypertension Brother    Breast cancer Maternal Aunt    Prostate cancer Paternal Grandfather    Hypothyroidism Daughter    Colon cancer Neg Hx    Esophageal cancer Neg Hx    Ulcerative colitis Neg Hx    Uterine cancer Neg Hx     Past Surgical History:  Procedure Laterality Date   ABDOMINAL AORTOGRAM W/LOWER EXTREMITY N/A 06/23/2020   Procedure: ABDOMINAL AORTOGRAM W/LOWER EXTREMITY;  Surgeon: Monica Martinez  J, MD;  Location: [facility];  Service: Cardiovascular;  Laterality: N/A;   ANTERIOR LAT LUMBAR FUSION Right 02/05/2019   Procedure: ATTEMPTED ANTERIOR LATERAL INTERBODY FUSION;  Surgeon: Consuella Lose, MD;  Location: [facility];  Service: Neurosurgery;  Laterality: Right;  RIGHT LATERAL TRANSPSOAS DISCECTOMY AND FUSION WITH ROBOTIC ASSISTED PEDICLE SCREW STABILIZATION, LUMBAR 4- LUMBAR 5   CATARACT EXTRACTION W/ INTRAOCULAR LENS  IMPLANT, BILATERAL Bilateral 2013   lens implants for cataracts after Lasik   COLONOSCOPY     ENDOVEIN HARVEST OF GREATER SAPHENOUS VEIN Left 06/29/2020   Procedure: HARVEST OF LEFT GREATER SAPHENOUS VEIN;  Surgeon: [name], MD;  Location: [facility];  Service: Vascular;  Laterality: Left;   FEMORAL-POPLITEAL  BYPASS GRAFT Left 06/29/2020   Procedure: BYPASS GRAFT LEFT COMMON FEMORAL TO ABOVE KNEE POPLITEAL ARTERY;  Surgeon: [name], MD;  Location: [facility];  Service: Vascular;  Laterality: Left;   HEMORRHOID SURGERY N/A 04/06/2021   Procedure: HEMORRHOIDECTOMY 2 COLUMN;  Surgeon: Ileana Roup, MD;  Location: [facility];  Service: General;  Laterality: N/A;   KNEE ARTHROSCOPY Right [place] N/A 04/06/2021   Procedure: ANORECTAL EXAM UNDER ANESTHESIA/ excision of anal polyp;  Surgeon: Ileana Roup, MD;  Location: [facility];  Service: General;  Laterality: N/A;   TONSILLECTOMY     at a very young age, but grew back   TOTAL HIP ARTHROPLASTY Right 05/25/2019   Procedure: RIGHT TOTAL HIP ARTHROPLASTY ANTERIOR APPROACH;  Surgeon: Leandrew Koyanagi, MD;  Location: [facility];  Service: Orthopedics;  Laterality: Right;   TOTAL KNEE ARTHROPLASTY Right 09/04/2021   Procedure: RIGHT TOTAL KNEE ARTHROPLASTY;  Surgeon: Leandrew Koyanagi, MD;  Location: [facility];  Service: Orthopedics;  Laterality: Right;   Social History   Occupational History   Occupation: retired  Tobacco Use   Smoking status: Every Day    Packs/day: 0.25    Years: 53.00    Total pack years: 13.25    Types: Cigarettes   Smokeless tobacco: Never   Tobacco comments:    had first cigarettes age  72, habit started at age 77/today he 44 cig a day  Vaping Use   Vaping Use: Never used  Substance and Sexual Activity   Alcohol use: Not Currently    Comment: 05/10/1991   Drug use: Not Currently    Comment: prior usage of "speed"  when drinking   Sexual activity: Yes

## 2021-11-29 NOTE — Telephone Encounter (Signed)
Ortho bundle 90 day call completed for Right total knee replacement. No further CM needs.

## 2021-11-30 ENCOUNTER — Ambulatory Visit: Payer: Medicare Other | Admitting: Physical Therapy

## 2021-12-01 ENCOUNTER — Encounter: Payer: Self-pay | Admitting: Family Medicine

## 2021-12-01 ENCOUNTER — Ambulatory Visit (INDEPENDENT_AMBULATORY_CARE_PROVIDER_SITE_OTHER): Payer: Medicare Other | Admitting: Family Medicine

## 2021-12-01 ENCOUNTER — Ambulatory Visit: Payer: Self-pay

## 2021-12-01 VITALS — BP 122/66 | HR 93 | Temp 97.7°F | Ht 67.0 in | Wt 153.0 lb

## 2021-12-01 DIAGNOSIS — G8929 Other chronic pain: Secondary | ICD-10-CM | POA: Diagnosis not present

## 2021-12-01 DIAGNOSIS — M79671 Pain in right foot: Secondary | ICD-10-CM

## 2021-12-01 DIAGNOSIS — M79672 Pain in left foot: Secondary | ICD-10-CM

## 2021-12-01 DIAGNOSIS — M545 Low back pain, unspecified: Secondary | ICD-10-CM

## 2021-12-01 DIAGNOSIS — Z23 Encounter for immunization: Secondary | ICD-10-CM | POA: Diagnosis not present

## 2021-12-01 NOTE — Patient Outreach (Signed)
  Care Coordination   Initial Visit Note   12/01/2021 Name: Gerald Carpenter MRN: [id number] DOB: 23-Sep-1950  Gerald Carpenter is a 71 y.o. year old male who sees Rudd, Lillette Boxer, MD for primary care. I spoke with  Gerald Carpenter by phone today.  What matters to the patients health and wellness today?  I have foot pain    Goals Addressed             This Visit's Progress    COMPLETED: Care Coordination Activites -no folllow up required        Care Coordination Interventions: Active listening / Reflection utilized  Emotional Support Provided Problem [place] strategies reviewed Discussed/.Educated Care Coordination Program 2.   Discussed/.Educated Annual Wellness Visit 3.   Discussed/.Educated Social Determinates of Health 4.   Please inform PCP if services needed in the future I spoke with Gerald Carpenter, who expressed his concern about the persistent pain in his feet. He has already consulted with several physicians, but he is still trying to decide the best course of action to take. As a result, I advised him to seek advice from his primary care physician to determine the best way forward to whom he respects his opinion.        SDOH assessments and interventions completed:  Yes  SDOH Interventions Today    Flowsheet Row Most Recent Value  SDOH Interventions   Food Insecurity Interventions Intervention Not Indicated  Transportation Interventions Intervention Not Indicated        Care Coordination Interventions Activated:  Yes  Care Coordination Interventions:  Yes, provided   Follow up plan: No further intervention required.   Encounter Outcome:  Pt. Visit Completed   Lazaro Arms RN, BSN, [facility]   Phone: [phone]

## 2021-12-01 NOTE — Progress Notes (Signed)
[facility] [address] [place] [address] Dept: [phone] Dept Fax: [phone]  Office Visit  Subjective:    Patient ID: Gerald Carpenter, male    DOB: Aug 24, 1950, 71 y.o..   MRN: [id number]  Chief Complaint  Patient presents with   Acute Visit    C/o having bilateral foot pain.  Had MRI done.      History of Present Illness: Patient is in today to discuss his ongoing foot pain. Mr. Szczepanik has had this issue for about 2 years. I saw him in May about this. He described it feeling like sharp pain along the ball of the foot. The right foot is worse than the left. The pain increased with ambulation. He had some burning sensation at times as well. He also felt like plantar flexion of his toes is restricted. He has had this evaluated by multiple providers in the past and felt quite frustrated that no therapies have worked to reduce his pain. He recalls being told this was due to a pinched nerve by a "foot specialist" (Podiatry?) and having a steroid injection, which did not help. He saw Dr. Sharol Given (orthopedics) and was told it was related to his Achilles tendon. He was told to do ankle stretches. Mr. Quattrone does have a history of a lumbar radiculopathy, for which he had back surgery. He has a history of peripheral arterial disease and claudication, which were managed with an arterial bypass. He also has had a right hip joint replacement and right knee joint replacement. He does continue to smoke. He was seen by neurology. They did not feel like there was a peripheral neuropathy involved. They ordered the MRI of the right foot.  Past Medical History: Patient Active Problem List   Diagnosis Date Noted   Status post total right knee replacement 09/04/2021   Leukocytosis 10/11/2020   PAD (peripheral artery disease) ([place]) 06/29/2020   Prediabetes 04/22/2020   Chronic kidney disease, stage 3b ([place]) 04/15/2020   Severe tobacco dependence  04/12/2020   Lower urinary tract symptoms (LUTS) 04/12/2020   History of colon polyps 04/12/2020   Atherosclerosis of native arteries of extremity with intermittent claudication ([place]) 03/01/2020   Pain in both feet 07/31/2019   Status post total replacement of right hip 05/25/2019   Lumbar radiculopathy 02/05/2019   Past Surgical History:  Procedure Laterality Date   ABDOMINAL AORTOGRAM W/LOWER EXTREMITY N/A 06/23/2020   Procedure: ABDOMINAL AORTOGRAM W/LOWER EXTREMITY;  Surgeon: [name], MD;  Location: [facility];  Service: Cardiovascular;  Laterality: N/A;   ANTERIOR LAT LUMBAR FUSION Right 02/05/2019   Procedure: ATTEMPTED ANTERIOR LATERAL INTERBODY FUSION;  Surgeon: Consuella Lose, MD;  Location: [facility];  Service: Neurosurgery;  Laterality: Right;  RIGHT LATERAL TRANSPSOAS DISCECTOMY AND FUSION WITH ROBOTIC ASSISTED PEDICLE SCREW STABILIZATION, LUMBAR 4- LUMBAR 5   CATARACT EXTRACTION W/ INTRAOCULAR LENS  IMPLANT, BILATERAL Bilateral 2013   lens implants for cataracts after Lasik   COLONOSCOPY     ENDOVEIN HARVEST OF GREATER SAPHENOUS VEIN Left 06/29/2020   Procedure: HARVEST OF LEFT GREATER SAPHENOUS VEIN;  Surgeon: [name], MD;  Location: [facility];  Service: Vascular;  Laterality: Left;   FEMORAL-POPLITEAL BYPASS GRAFT Left 06/29/2020   Procedure: BYPASS GRAFT LEFT COMMON FEMORAL TO ABOVE KNEE POPLITEAL ARTERY;  Surgeon: [name], MD;  Location: [facility];  Service: Vascular;  Laterality: Left;   HEMORRHOID SURGERY N/A 04/06/2021   Procedure: HEMORRHOIDECTOMY 2 COLUMN;  Surgeon: Dema Severin,  Sharon Mt, MD;  Location: [facility];  Service: General;  Laterality: N/A;   KNEE ARTHROSCOPY Right 1980   RECTAL EXAM UNDER ANESTHESIA N/A 04/06/2021   Procedure: ANORECTAL EXAM UNDER ANESTHESIA/ excision of anal polyp;  Surgeon: Ileana Roup, MD;  Location: [facility];  Service: General;  Laterality: N/A;    TONSILLECTOMY     at a very young age, but grew back   TOTAL HIP ARTHROPLASTY Right 05/25/2019   Procedure: RIGHT TOTAL HIP ARTHROPLASTY ANTERIOR APPROACH;  Surgeon: Leandrew Koyanagi, MD;  Location: [facility];  Service: Orthopedics;  Laterality: Right;   TOTAL KNEE ARTHROPLASTY Right 09/04/2021   Procedure: RIGHT TOTAL KNEE ARTHROPLASTY;  Surgeon: Leandrew Koyanagi, MD;  Location: [facility];  Service: Orthopedics;  Laterality: Right;   Family History  Problem Relation Age of Onset   Pulmonary fibrosis Mother    Breast cancer Mother    Hyperlipidemia Mother    Hypertension Mother    Other Mother        Scarlet Fever   Heart disease Father    Hyperlipidemia Father    Diabetes Brother    Hypertension Brother    Breast cancer Maternal Aunt    Prostate cancer Paternal Grandfather    Hypothyroidism Daughter    Colon cancer Neg Hx    Esophageal cancer Neg Hx    Ulcerative colitis Neg Hx    Uterine cancer Neg Hx    Outpatient Medications Prior to Visit  Medication Sig Dispense Refill   aspirin EC 81 MG tablet Take 1 tablet (81 mg total) by mouth 2 (two) times daily. To be taken after surgery to prevent blood clots.  Swallow whole. 84 tablet 0   atorvastatin (LIPITOR) 40 MG tablet Take 1 tablet (40 mg total) by mouth daily. 90 tablet 3   tamsulosin (FLOMAX) 0.4 MG CAPS capsule TAKE 1 CAPSULE(0.4 MG) BY MOUTH IN THE MORNING 90 capsule 1   traMADol (ULTRAM) 50 MG tablet Take 1 tablet (50 mg total) by mouth 3 (three) times daily as needed. 60 tablet 2   ondansetron (ZOFRAN) 4 MG tablet Take 1 tablet (4 mg total) by mouth every 8 (eight) hours as needed for nausea or vomiting. (Patient not taking: Reported on 12/01/2021) 40 tablet 0   Wheat Dextrin-Vit B6-B12-FA ([place]) Take 2 [place] by mouth daily.     docusate sodium (COLACE) 100 MG capsule Take 1 capsule (100 mg total) by mouth daily as needed. (Patient not taking: Reported on 12/01/2021) 30 capsule 2   methocarbamol (ROBAXIN-750)  750 MG tablet Take 1 tablet (750 mg total) by mouth 2 (two) times daily as needed for muscle spasms. (Patient not taking: Reported on 12/01/2021) 30 tablet 2   tiZANidine (ZANAFLEX) 4 MG tablet Take 1 tablet (4 mg total) by mouth every 6 (six) hours as needed for muscle spasms. (Patient not taking: Reported on 12/01/2021) 30 tablet 2   No facility-administered medications prior to visit.   Allergies  Allergen Reactions   Anesthetics, Amide Nausea And Vomiting    Objective:   Today's Vitals   12/01/21 1357  BP: 122/66  Pulse: 93  Temp: 97.7 F (36.5 C)  TempSrc: Temporal  SpO2: 98%  Weight: 153 lb (69.4 kg)  Height: '5\' 7"'$  (1.702 m)   Body mass index is 23.96 kg/m.   General: Well developed, well nourished. No acute distress. Psych: Alert and oriented. Normal mood and affect.  Health Maintenance Due  Topic  Date Due   Zoster Vaccines- Shingrix (1 of 2) Never done   Imaging: MRI of Right Foot (11/03/2021) IMPRESSION: 1. No acute osseous injury of the right foot. 2. Mild osteoarthritis of the first MTP joint.    Assessment & Plan:   1. Chronic bilateral low back pain, unspecified whether sciatica present 2. Pain in both feet Mr. Walker has persistent pain in the feet. Although he has some evidence for mild arthritis, he feels his pain is much greater than would be explained by this. I reviewed his neurology consult. He has had prior surgery of the L4-L5 vertebrae. He notes at the time of evaluation of this 5 years ago, they did see some degenerative changes in the L2-L3 and L3-L4 level. I will go ahead and proceed with an MRI of the lumbar spine to assess.  - MR Lumbar Spine Wo Contrast; Future  3. Need for 23-polyvalent pneumococcal polysaccharide vaccine  - Pneumococcal polysaccharide vaccine 23-valent greater than or equal to 2yo subcutaneous/IM  Return in about 4 weeks (around 12/29/2021) for Reassessment.   Haydee Salter, MD

## 2021-12-01 NOTE — Patient Instructions (Signed)
Visit Information  Thank you for taking time to visit with me today. Please don't hesitate to contact me if I can be of assistance to you.   Following are the goals we discussed today:   Goals Addressed             This Visit's Progress    COMPLETED: Care Coordination Activites -no folllow up required        Care Coordination Interventions: Active listening / Reflection utilized  Emotional Support Provided Problem [place] strategies reviewed Discussed/.Educated Care Coordination Program 2.   Discussed/.Educated Annual Wellness Visit 3.   Discussed/.Educated Social Determinates of Health 4.   Please inform PCP if services needed in the future I spoke with Mr. Payes, who expressed his concern about the persistent pain in his feet. He has already consulted with several physicians, but he is still trying to decide the best course of action to take. As a result, I advised him to seek advice from his primary care physician to determine the best way forward to whom he respects his opinion.        If you are experiencing a Mental Health or [place] or need someone to talk to, please call 1-800-273-TALK (toll free, 24 hour hotline)  Patient verbalizes understanding of instructions and care plan provided today and agrees to view in [place]. Active MyChart status and patient understanding of how to access instructions and care plan via MyChart confirmed with patient.     Lazaro Arms RN, BSN, [facility]   Phone: [phone]

## 2021-12-04 ENCOUNTER — Ambulatory Visit: Payer: Medicare Other | Admitting: Physical Medicine and Rehabilitation

## 2021-12-11 ENCOUNTER — Ambulatory Visit: Payer: Medicare Other | Admitting: Physical Medicine and Rehabilitation

## 2021-12-17 ENCOUNTER — Ambulatory Visit
Admission: RE | Admit: 2021-12-17 | Discharge: 2021-12-17 | Disposition: A | Discharge status: Home / Self Care | Payer: Medicare Other | Source: Ambulatory Visit | Attending: Family Medicine | Admitting: Family Medicine

## 2021-12-17 DIAGNOSIS — M545 Low back pain, unspecified: Secondary | ICD-10-CM

## 2021-12-17 DIAGNOSIS — M79671 Pain in right foot: Secondary | ICD-10-CM

## 2021-12-20 ENCOUNTER — Ambulatory Visit (INDEPENDENT_AMBULATORY_CARE_PROVIDER_SITE_OTHER): Payer: Medicare Other

## 2021-12-20 VITALS — Ht 67.0 in | Wt 156.0 lb

## 2021-12-20 DIAGNOSIS — Z0000 Encounter for general adult medical examination without abnormal findings: Secondary | ICD-10-CM | POA: Diagnosis not present

## 2021-12-20 NOTE — Progress Notes (Addendum)
Subjective:   Gerald Carpenter is a 71 y.o. male who presents for Medicare Annual/Subsequent preventive examination.   I connected with  Gerald Carpenter on 12/20/21 by a audio enabled telemedicine application and verified that I am speaking with the correct person using two identifiers.  Patient Location: Home  Provider Location: Home Office  I discussed the limitations of evaluation and management by telemedicine. The patient expressed understanding and agreed to proceed.  Review of Systems     Cardiac Risk Factors include: advanced age (>43mn, >>8women);male gender     Objective:    Today's Vitals   12/20/21 0915  Weight: 156 lb (70.8 kg)  Height: '5\' 7"'$  (1.702 m)   Body mass index is 24.43 kg/m.     12/20/2021    9:20 AM 10/17/2021    8:03 AM 09/04/2021    1:44 PM 08/30/2021   10:19 AM 04/06/2021    1:07 PM 11/28/2020    1:19 PM 11/28/2020   12:47 PM  Advanced Directives  Does Patient Have a Medical Advance Directive? Yes No No No No No No  Type of [place]        Copy of HPablo Carpenter Chart? No - copy requested        Would patient like information on creating a medical advance directive?   No - Patient declined No - Patient declined No - Patient declined No - Patient declined No - Patient declined    Current Medications (verified) Outpatient Encounter Medications as of 12/20/2021  Medication Sig   aspirin EC 81 MG tablet Take 1 tablet (81 mg total) by mouth 2 (two) times daily. To be taken after surgery to prevent blood clots.  Swallow whole.   atorvastatin (LIPITOR) 40 MG tablet Take 1 tablet (40 mg total) by mouth daily.   ondansetron (ZOFRAN) 4 MG tablet Take 1 tablet (4 mg total) by mouth every 8 (eight) hours as needed for nausea or vomiting.   tamsulosin (FLOMAX) 0.4 MG CAPS capsule TAKE 1 CAPSULE(0.4 MG) BY MOUTH IN THE MORNING   traMADol (ULTRAM) 50 MG tablet Take 1 tablet (50 mg total) by  mouth 3 (three) times daily as needed.   Wheat Dextrin-Vit B6-B12-FA ([place] Take 2 Scoops by mouth daily.   No facility-administered encounter medications on file as of 12/20/2021.    Allergies (verified) Anesthetics, amide   History: Past Medical History:  Diagnosis Date   [place]ism ([place]    Arthritis    Cataract    Colon polyps    Complication of anesthesia    HLD (hyperlipidemia)    Nephritis    when pt. was 7 yrs. ols   Peripheral vascular disease (HCC)    PONV (postoperative nausea and vomiting)    "Only when I have aortagram on 06/23/20".   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   Substance abuse (Gerald Carpenter    Past Surgical History:  Procedure Laterality Date   ABDOMINAL AORTOGRAM W/LOWER EXTREMITY N/A 06/23/2020   Procedure: ABDOMINAL AORTOGRAM W/LOWER EXTREMITY;  Surgeon: CMarty Heck Carpenter;  [facility];  Service: Cardiovascular;  Laterality: N/A;   ANTERIOR LAT LUMBAR FUSION Right 02/05/2019   Procedure: ATTEMPTED ANTERIOR LATERAL INTERBODY FUSION;  Surgeon: NConsuella Lose Carpenter;  [facility]  Service: Neurosurgery;  Laterality: Right;  RIGHT LATERAL TRANSPSOAS DISCECTOMY AND FUSION WITH ROBOTIC ASSISTED PEDICLE SCREW STABILIZATION, LUMBAR 4- LUMBAR 5   CATARACT EXTRACTION W/  INTRAOCULAR LENS  IMPLANT, BILATERAL Bilateral 2013   lens implants for cataracts after Lasik   COLONOSCOPY     ENDOVEIN HARVEST OF GREATER SAPHENOUS VEIN Left 06/29/2020   Procedure: HARVEST OF LEFT GREATER SAPHENOUS VEIN;  Surgeon: Gerald Heck, Carpenter;  Location: [place];  Service: Vascular;  Laterality: Left;   FEMORAL-POPLITEAL BYPASS GRAFT Left 06/29/2020   Procedure: BYPASS GRAFT LEFT COMMON FEMORAL TO ABOVE KNEE POPLITEAL ARTERY;  Surgeon: Gerald Heck, Carpenter;  Location: [place];  Service: Vascular;  Laterality: Left;   HEMORRHOID SURGERY N/A 04/06/2021   Procedure: HEMORRHOIDECTOMY 2 COLUMN;  Surgeon: Ileana Roup, Carpenter;   Location: [place] [facility];  Service: General;  Laterality: N/A;   KNEE ARTHROSCOPY Right 1980   RECTAL EXAM UNDER ANESTHESIA N/A 04/06/2021   Procedure: ANORECTAL EXAM UNDER ANESTHESIA/ excision of anal polyp;  Surgeon: Ileana Roup, Carpenter;  Location: [facility];  Service: General;  Laterality: N/A;   TONSILLECTOMY     at a very young age, but grew back   TOTAL HIP ARTHROPLASTY Right 05/25/2019   Procedure: RIGHT TOTAL HIP ARTHROPLASTY ANTERIOR APPROACH;  Surgeon: Gerald Koyanagi, Carpenter;  Location: [place];  Service: Orthopedics;  Laterality: Right;   TOTAL KNEE ARTHROPLASTY Right 09/04/2021   Procedure: RIGHT TOTAL KNEE ARTHROPLASTY;  Surgeon: Gerald Koyanagi, Carpenter;  Location: [place];  Service: Orthopedics;  Laterality: Right;   Family History  Problem Relation Age of Onset   Pulmonary fibrosis Mother    Breast cancer Mother    Hyperlipidemia Mother    Hypertension Mother    Other Mother        Scarlet Fever   Heart disease Father    Hyperlipidemia Father    Diabetes Brother    Hypertension Brother    Breast cancer Maternal Aunt    Prostate cancer Paternal Grandfather    Hypothyroidism Daughter    Colon cancer Neg Hx    Esophageal cancer Neg Hx    Ulcerative colitis Neg Hx    Uterine cancer Neg Hx    Social History   Socioeconomic History   Marital status: Married    Spouse name: Gerald Carpenter   Number of children: 3   Years of education: Not on file   Highest education level: Not on file  Occupational History   Occupation: retired  Tobacco Use   Smoking status: Every Day    Packs/day: 0.25    Years: 53.00    Total pack years: 13.25    Types: Cigarettes   Smokeless tobacco: Never   Tobacco comments:    had first cigarettes age  89, habit started at age 76/today he 71 cig a day  Vaping Use   Vaping Use: Never used  Substance and Sexual Activity   Alcohol use: Not Currently    Comment: 05/10/1991   Drug use: Not Currently    Comment: prior usage of  "speed"  when drinking   Sexual activity: Yes  Other Topics Concern   Not on file  Social History Narrative   Recently moved here from [facility], [place]   Has total of 4 children 1 adopted   Right handed   Caffeine 3 cup a day    Live in 2 story home   Social Determinants of Health   Financial Resource Strain: Low Risk  (12/20/2021)   Overall Financial Resource Strain (CARDIA)    Difficulty of Paying Living Expenses: Not hard at all  Food Insecurity: No Food Insecurity (  12/20/2021)   Hunger Vital Sign    Worried About Running Out of Food in the Last Year: Never true    [place] in the Last Year: Never true  Transportation Needs: No Transportation Needs (12/20/2021)   PRAPARE - Hydrologist (Medical): No    Lack of Transportation (Non-Medical): No  Physical Activity: Insufficiently Active (12/20/2021)   Exercise Vital Sign    Days of Exercise per Week: 3 days    Minutes of Exercise per Session: 30 min  Stress: No Stress Concern Present (12/20/2021)   [place]    Feeling of Stress : Not at all  Social Connections: Moderately Integrated (12/20/2021)   Social Connection and Isolation Panel [NHANES]    Frequency of Communication with Friends and Family: More than three times a week    Frequency of Social Gatherings with Friends and Family: More than three times a week    Attends Religious Services: 1 to 4 times per year    Active Member of [employer] or Organizations: No    Attends Archivist Meetings: Never    Marital Status: Married    Tobacco Counseling Ready to quit: No Counseling given: No Tobacco comments: had first cigarettes age  64, habit started at age 53/today he 24 cig a day   Clinical Intake:  Pre-visit preparation completed: Yes  Pain : No/denies pain     Nutritional Risks: None Diabetes: No  How often do you need to have someone help you when you read  instructions, pamphlets, or other written materials from your doctor or pharmacy?: 1 - Never  Diabetic?no   Interpreter Needed?: No  Information entered by :: Jadene Pierini, LPN   Activities of Daily Living    12/20/2021    9:19 AM 12/16/2021    8:48 AM  In your present state of health, do you have any difficulty performing the following activities:  Hearing? 0 0  Vision? 0 0  Difficulty concentrating or making decisions? 0 0  Walking or climbing stairs? 0 0  Dressing or bathing? 0 0  Doing errands, shopping? 0 0  Preparing Food and eating ? N N  Using the Toilet? N N  In the past six months, have you accidently leaked urine? N N  Do you have problems with loss of bowel control? N N  Managing your Medications? N N  Managing your Finances? N N  Housekeeping or managing your Housekeeping? N N    Patient Care Team: Haydee Salter, Carpenter as PCP - General (Family Medicine) Gerald Heck, Carpenter as Consulting Physician (Vascular Surgery) Gerald Koyanagi, Carpenter as Attending Physician (Orthopedic Surgery) Gerald Lose, Carpenter as Consulting Physician (Neurosurgery) Ranell Patrick Clide Deutscher, Carpenter as Consulting Physician (Physical Medicine and Rehabilitation) Ileana Roup, Carpenter as Consulting Physician (General Surgery)  Indicate any recent Medical Services you may have received from other than [facility] providers in the past year (date may be approximate).     Assessment:   This is a routine wellness examination for Sara.  Hearing/Vision screen Vision Screening - Comments:: Declined Referral   Dietary issues and exercise activities discussed: Current Exercise Habits: The patient does not participate in regular exercise at present   Goals Addressed             This Visit's Progress    Patient Stated   On track    Would like to get back to part-time  work       Depression Screen    12/20/2021    9:18 AM 10/25/2021    7:57 AM 04/13/2021    9:19 AM 02/03/2021   10:09 AM  11/17/2020    9:47 AM 08/09/2020    8:31 AM 04/12/2020    9:30 AM  PHQ 2/9 Scores  PHQ - 2 Score 0 0 0 0 6 0 0  PHQ- 9 Score     14      Fall Risk    12/20/2021    9:17 AM 12/16/2021    8:48 AM 10/17/2021    8:03 AM 04/13/2021    9:19 AM 02/03/2021   10:09 AM  Fall Risk   Falls in the past year? 0 0  1 0  Number falls in past yr: 0 0 0 1   Injury with Fall? 0 0 0 0   Risk for fall due to : No Fall Risks   History of fall(s)   Follow up Falls prevention discussed   Falls evaluation completed     FALL RISK PREVENTION PERTAINING TO THE HOME:  Any stairs in or around the home? Yes  If so, are there any without handrails? No  Home free of loose throw rugs in walkways, pet beds, electrical cords, etc? Yes  Adequate lighting in your home to reduce risk of falls? Yes   ASSISTIVE DEVICES UTILIZED TO PREVENT FALLS:  Life alert? No  Use of a cane, walker or w/c? No  Grab bars in the bathroom? No  Shower chair or bench in shower? No  Elevated toilet seat or a handicapped toilet? No        12/20/2021    9:19 AM  6CIT Screen  What Year? 0 points  What month? 0 points  What time? 0 points  Count back from 20 0 points  Months in reverse 0 points  Repeat phrase 0 points  Total Score 0 points    Immunizations Immunization History  Administered Date(s) Administered   PFIZER(Purple Top)SARS-COV-2 Vaccination 04/11/2019, 05/05/2019   Pneumococcal Conjugate-13 04/12/2020   Pneumococcal Polysaccharide-23 12/01/2021   Tdap 11/10/2017   Zoster, Live 03/26/2016    TDAP status: Up to date  Flu Vaccine status: Declined, Education has been provided regarding the importance of this vaccine but patient still declined. Advised may receive this vaccine at local pharmacy or Health Dept. Aware to provide a copy of the vaccination record if obtained from local pharmacy or Health Dept. Verbalized acceptance and understanding.  Pneumococcal vaccine status: Up to date  Covid-19 vaccine status:  Completed vaccines  Qualifies for Shingles Vaccine? Yes   Zostavax completed No   Shingrix Completed?: No.    Education has been provided regarding the importance of this vaccine. Patient has been advised to call insurance company to determine out of pocket expense if they have not yet received this vaccine. Advised may also receive vaccine at local pharmacy or Health Dept. Verbalized acceptance and understanding.  Screening Tests Health Maintenance  Topic Date Due   Zoster Vaccines- Shingrix (1 of 2) Never done   COVID-19 Vaccine (3 - Pfizer risk series) 06/02/2019   Medicare Annual Wellness (AWV)  12/21/2022   COLONOSCOPY (Pts 45-22yr Insurance coverage will need to be confirmed)  07/13/2024   TETANUS/TDAP  11/11/2027   Pneumonia Vaccine 71 Years old  Completed   Hepatitis C Screening  Completed   HPV VACCINES  Aged Out   INFLUENZA VACCINE  Discontinued    Health Maintenance  Health Maintenance Due  Topic Date Due   Zoster Vaccines- Shingrix (1 of 2) Never done   COVID-19 Vaccine (3 - Pfizer risk series) 06/02/2019    Colorectal cancer screening: Type of screening: Colonoscopy. Completed 07/13/2021. Repeat every 3 years  Lung Cancer Screening: (Low Dose CT Chest recommended if Age 69-80 years, 30 pack-year currently smoking OR have quit w/in 15years.) does not qualify.   Lung Cancer Screening Referral: n/a  Additional Screening:  Hepatitis C Screening: does not qualify;   Vision Screening: Recommended annual ophthalmology exams for early detection of glaucoma and other disorders of the eye. Is the patient up to date with their annual eye exam?  No  Who is the provider or what is the name of the office in which the patient attends annual eye exams? None declined referral  If pt is not established with a provider, would they like to be referred to a provider to establish care? No .   Dental Screening: Recommended annual dental exams for proper oral hygiene  Community  Resource Referral / Chronic Care Management: CRR required this visit?  No   CCM required this visit?  No      Plan:     I have personally reviewed and noted the following in the patient's chart:   Medical and social history Use of alcohol, tobacco or illicit drugs  Current medications and supplements including opioid prescriptions. Patient is not currently taking opioid prescriptions. Functional ability and status Nutritional status Physical activity Advanced directives List of other physicians Hospitalizations, surgeries, and ER visits in previous 12 months Vitals Screenings to include cognitive, depression, and falls Referrals and appointments  In addition, I have reviewed and discussed with patient certain preventive protocols, quality metrics, and best practice recommendations. A written personalized care plan for preventive services as well as general preventive health recommendations were provided to patient.     Daphane Shepherd, LPN   [id number]   Nurse Notes: Declined Eye Doctor referral

## 2021-12-20 NOTE — Patient Instructions (Signed)
Gerald Carpenter , Thank you for taking time to come for your Medicare Wellness Visit. I appreciate your ongoing commitment to your health goals. Please review the following plan we discussed and let me know if I can assist you in the future.   These are the goals we discussed:  Goals      Patient Stated     Would like to get back to part-time work        This is a list of the screening recommended for you and due dates:  Health Maintenance  Topic Date Due   Zoster (Shingles) Vaccine (1 of 2) Never done   COVID-19 Vaccine (3 - Pfizer risk series) 06/02/2019   Medicare Annual Wellness Visit  12/21/2022   Colon Cancer Screening  07/13/2024   Tetanus Vaccine  11/11/2027   Pneumonia Vaccine  Completed   Hepatitis C Screening: USPSTF Recommendation to screen - Ages 18-79 yo.  Completed   HPV Vaccine  Aged Out   Flu Shot  Discontinued    Advanced directives: Please bring a copy of your health care power of attorney and living will to the office to be added to your chart at your convenience.   Conditions/risks identified: Aim for 30 minutes of exercise or brisk walking, 6-8 glasses of water, and 5 servings of fruits and vegetables each day.   Next appointment: Follow up in one year for your annual wellness visit.   Preventive Care 41 Years and Older, Male  Preventive care refers to lifestyle choices and visits with your health care provider that can promote health and wellness. What does preventive care include? A yearly physical exam. This is also called an annual well check. Dental exams once or twice a year. Routine eye exams. Ask your health care provider how often you should have your eyes checked. Personal lifestyle choices, including: Daily care of your teeth and gums. Regular physical activity. Eating a healthy diet. Avoiding tobacco and drug use. Limiting alcohol use. Practicing safe sex. Taking low doses of aspirin every day. Taking vitamin and mineral supplements as  recommended by your health care provider. What happens during an annual well check? The services and screenings done by your health care provider during your annual well check will depend on your age, overall health, lifestyle risk factors, and family history of disease. Counseling  Your health care provider may ask you questions about your: Alcohol use. Tobacco use. Drug use. Emotional well-being. Home and relationship well-being. Sexual activity. Eating habits. History of falls. Memory and ability to understand (cognition). Work and work Statistician. Screening  You may have the following tests or measurements: Height, weight, and BMI. Blood pressure. Lipid and cholesterol levels. These may be checked every 5 years, or more frequently if you are over 69 years old. Skin check. Lung cancer screening. You may have this screening every year starting at age 69 if you have a 30-pack-year history of smoking and currently smoke or have quit within the past 15 years. Fecal occult blood test (FOBT) of the stool. You may have this test every year starting at age 69. Flexible sigmoidoscopy or colonoscopy. You may have a sigmoidoscopy every 5 years or a colonoscopy every 10 years starting at age 69. Prostate cancer screening. Recommendations will vary depending on your family history and other risks. Hepatitis C blood test. Hepatitis B blood test. Sexually transmitted disease (STD) testing. Diabetes screening. This is done by checking your blood sugar (glucose) after you have not eaten for a while (  fasting). You may have this done every 1-3 years. Abdominal aortic aneurysm (AAA) screening. You may need this if you are a current or former smoker. Osteoporosis. You may be screened starting at age 69 if you are at high risk. Talk with your health care provider about your test results, treatment options, and if necessary, the need for more tests. Vaccines  Your health care provider may recommend  certain vaccines, such as: Influenza vaccine. This is recommended every year. Tetanus, diphtheria, and acellular pertussis (Tdap, Td) vaccine. You may need a Td booster every 10 years. Zoster vaccine. You may need this after age 69. Pneumococcal 13-valent conjugate (PCV13) vaccine. One dose is recommended after age 69. Pneumococcal polysaccharide (PPSV23) vaccine. One dose is recommended after age 69. Talk to your health care provider about which screenings and vaccines you need and how often you need them. This information is not intended to replace advice given to you by your health care provider. Make sure you discuss any questions you have with your health care provider. Document Released: 03/04/2015 Document Revised: 10/26/2015 Document Reviewed: 12/07/2014 Elsevier Interactive Patient Education  2017 [place] Prevention in the Home Falls can cause injuries. They can happen to people of all ages. There are many things you can do to make your home safe and to help prevent falls. What can I do on the outside of my home? Regularly fix the edges of walkways and driveways and fix any cracks. Remove anything that might make you trip as you walk through a door, such as a raised step or threshold. Trim any bushes or trees on the path to your home. Use bright outdoor lighting. Clear any walking paths of anything that might make someone trip, such as rocks or tools. Regularly check to see if handrails are loose or broken. Make sure that both sides of any steps have handrails. Any raised decks and porches should have guardrails on the edges. Have any leaves, snow, or ice cleared regularly. Use sand or salt on walking paths during winter. Clean up any spills in your garage right away. This includes oil or grease spills. What can I do in the bathroom? Use night lights. Install grab bars by the toilet and in the tub and shower. Do not use towel bars as grab bars. Use non-skid mats or  decals in the tub or shower. If you need to sit down in the shower, use a plastic, non-slip stool. Keep the floor dry. Clean up any water that spills on the floor as soon as it happens. Remove soap buildup in the tub or shower regularly. Attach bath mats securely with double-sided non-slip rug tape. Do not have throw rugs and other things on the floor that can make you trip. What can I do in the bedroom? Use night lights. Make sure that you have a light by your bed that is easy to reach. Do not use any sheets or blankets that are too big for your bed. They should not hang down onto the floor. Have a firm chair that has side arms. You can use this for support while you get dressed. Do not have throw rugs and other things on the floor that can make you trip. What can I do in the kitchen? Clean up any spills right away. Avoid walking on wet floors. Keep items that you use a lot in easy-to-reach places. If you need to reach something above you, use a strong step stool that has a grab bar.  Keep electrical cords out of the way. Do not use floor polish or wax that makes floors slippery. If you must use wax, use non-skid floor wax. Do not have throw rugs and other things on the floor that can make you trip. What can I do with my stairs? Do not leave any items on the stairs. Make sure that there are handrails on both sides of the stairs and use them. Fix handrails that are broken or loose. Make sure that handrails are as long as the stairways. Check any carpeting to make sure that it is firmly attached to the stairs. Fix any carpet that is loose or worn. Avoid having throw rugs at the top or bottom of the stairs. If you do have throw rugs, attach them to the floor with carpet tape. Make sure that you have a light switch at the top of the stairs and the bottom of the stairs. If you do not have them, ask someone to add them for you. What else can I do to help prevent falls? Wear shoes that: Do not  have high heels. Have rubber bottoms. Are comfortable and fit you well. Are closed at the toe. Do not wear sandals. If you use a stepladder: Make sure that it is fully opened. Do not climb a closed stepladder. Make sure that both sides of the stepladder are locked into place. Ask someone to hold it for you, if possible. Clearly mark and make sure that you can see: Any grab bars or handrails. First and last steps. Where the edge of each step is. Use tools that help you move around (mobility aids) if they are needed. These include: Canes. Walkers. Scooters. Crutches. Turn on the lights when you go into a dark area. Replace any light bulbs as soon as they burn out. Set up your furniture so you have a clear path. Avoid moving your furniture around. If any of your floors are uneven, fix them. If there are any pets around you, be aware of where they are. Review your medicines with your doctor. Some medicines can make you feel dizzy. This can increase your chance of falling. Ask your doctor what other things that you can do to help prevent falls. This information is not intended to replace advice given to you by your health care provider. Make sure you discuss any questions you have with your health care provider. Document Released: 12/02/2008 Document Revised: 07/14/2015 Document Reviewed: 03/12/2014 Elsevier Interactive Patient Education  2017 [employer].

## 2021-12-27 ENCOUNTER — Encounter: Payer: Self-pay | Admitting: Family Medicine

## 2021-12-27 DIAGNOSIS — M79671 Pain in right foot: Secondary | ICD-10-CM

## 2021-12-27 NOTE — Addendum Note (Signed)
Addended by: Haydee Salter on: 12/27/2021 06:35 PM   Modules accepted: Orders

## 2022-01-16 ENCOUNTER — Ambulatory Visit (INDEPENDENT_AMBULATORY_CARE_PROVIDER_SITE_OTHER): Payer: Medicare Other | Admitting: Podiatry

## 2022-01-16 DIAGNOSIS — M7742 Metatarsalgia, left foot: Secondary | ICD-10-CM

## 2022-01-16 DIAGNOSIS — M79673 Pain in unspecified foot: Secondary | ICD-10-CM

## 2022-01-16 DIAGNOSIS — G6289 Other specified polyneuropathies: Secondary | ICD-10-CM | POA: Diagnosis not present

## 2022-01-16 DIAGNOSIS — M7741 Metatarsalgia, right foot: Secondary | ICD-10-CM

## 2022-01-16 DIAGNOSIS — G8929 Other chronic pain: Secondary | ICD-10-CM

## 2022-01-16 MED ORDER — GABAPENTIN 300 MG PO CAPS: ORAL_CAPSULE | ORAL | 0 refills | Status: DC

## 2022-01-16 NOTE — Progress Notes (Signed)
  Subjective:  Patient ID: Gerald Carpenter, male    DOB: 09/17/1950,  MRN: [id number]  Chief Complaint  Patient presents with   Foot Pain    NP Pain in both feet    71 y.o. male presents with the above complaint. History confirmed with patient. He has burning tingling pain, its been going on for several years now most within the ball of both feet.  He has had circulation issues as well as low back issues and has had both these addressed surgically with a bypass and lumbar spine surgery.  The pain has persisted although it has improved some.  He is using metatarsal pads and has seen multiple foot doctors and so far no one has been able to fix this  Objective:  Physical Exam: warm, good capillary refill, no trophic changes or ulcerative lesions, and weakly palpable pulses, he has an abnormal sensory exam with loss of protective sensation and subjective paresthesias.  No pain to palpation full range of motion  Assessment:   1. Other polyneuropathy   2. Metatarsalgia of both feet   3. Chronic foot pain, unspecified laterality      Plan:  Patient was evaluated and treated and all questions answered.  We discussed that he has peripheral neuropathy likely either age-related, related to his PAD or possibly from his lumbar pathology.  I discussed treatment of this and likely this is not something that is going to be able to be cured or fixed likely going to be a lifelong issue for him with chronic foot pain.  We discussed treatment of this with medication such as gabapentin.  He is interested in trying this.  I prescribed him gabapentin 3 mg to begin nightly for 1 month then may add a second dose in the morning and then a third dose after that.  We discussed the risk and side effects of this.  Long-term I think he likely will need chronic pain management and I sent a referral for him he has been established previously at the practice here and should reestablish for long-term treatment of this.  He  will return to see me as needed if he has further issues that may help with.  Do not see any clinical evidence of pathology within the foot that is contributing to this.  If worsening despite medical therapy may consider EMG/NCV but did not have any evidence of tarsal tunnel syndrome today  No follow-ups on file.

## 2022-02-01 ENCOUNTER — Other Ambulatory Visit: Payer: Self-pay | Admitting: Family Medicine

## 2022-02-01 DIAGNOSIS — R399 Unspecified symptoms and signs involving the genitourinary system: Secondary | ICD-10-CM

## 2022-03-01 ENCOUNTER — Encounter: Payer: Self-pay | Admitting: Orthopaedic Surgery

## 2022-03-01 ENCOUNTER — Ambulatory Visit (INDEPENDENT_AMBULATORY_CARE_PROVIDER_SITE_OTHER): Payer: Medicare Other

## 2022-03-01 ENCOUNTER — Ambulatory Visit (INDEPENDENT_AMBULATORY_CARE_PROVIDER_SITE_OTHER): Payer: Medicare Other | Admitting: Orthopaedic Surgery

## 2022-03-01 DIAGNOSIS — Z96651 Presence of right artificial knee joint: Secondary | ICD-10-CM

## 2022-03-01 MED ORDER — METHOCARBAMOL 750 MG PO TABS: 750.0000 mg | ORAL_TABLET | Freq: Two times a day (BID) | ORAL | 2 refills | Status: DC | PRN

## 2022-03-01 MED ORDER — PREDNISONE 10 MG (21) PO TBPK: ORAL_TABLET | ORAL | 0 refills | Status: DC

## 2022-03-01 NOTE — Progress Notes (Signed)
Post-Op Visit Note   Patient: Gerald Carpenter           Date of Birth: [birth year]           MRN: [id number] Visit Date: 03/01/2022 PCP: Haydee Salter, MD   Assessment & Plan:  Chief Complaint:  Chief Complaint  Patient presents with   Right Knee - Follow-up    Right total knee arthroplasty 09/04/2021   Right Hip - Pain   Visit Diagnoses:  1. Status post total right knee replacement     Plan: Patient is a pleasant 72 year old gentleman who comes in today 6 months status post right total knee replacement 09/04/2021.  He is doing well in regards to his knee.  He has regained near full range of motion and continues to work on strengthening exercises at the gym.  He has however been bothered by lower back pain and sciatica with paresthesias to both feet.  Recently put on gabapentin which has helped.  He is status post lumbar spine surgery by Dr. Claudina Lick.  Examination of the right knee reveals range of motion from 0 to 120 degrees.  Stable valgus varus stress.  Neurovascular intact distally.  At this point, he will continue activity as tolerated.  Dental prophylaxis reinforced.  Follow-up in 6 months for repeat evaluation and x-rays of the right knee.  In regards to his lumbar pain, have agreed to send in a steroid pack and muscle relaxer.  He will also follow-up with Dr. Kathyrn Sheriff.  Call with concerns or questions in the meantime.  Follow-Up Instructions: Return in about 6 months (around 08/30/2022).   Orders:  Orders Placed This Encounter  Procedures   XR Knee 1-2 Views Right   Meds ordered this encounter  Medications   predniSONE (STERAPRED UNI-PAK 21 TAB) 10 MG (21) TBPK tablet    Sig: Take as directed    Dispense:  21 tablet    Refill:  0   methocarbamol (ROBAXIN-750) 750 MG tablet    Sig: Take 1 tablet (750 mg total) by mouth 2 (two) times daily as needed for muscle spasms.    Dispense:  20 tablet    Refill:  2    Imaging: XR Knee 1-2 Views Right  Result Date:  03/01/2022 Stable right uncemented total knee replacement without any complication.   PMFS History: Patient Active Problem List   Diagnosis Date Noted   Status post total right knee replacement 09/04/2021   Leukocytosis 10/11/2020   PAD (peripheral artery disease) ([place]) 06/29/2020   Prediabetes 04/22/2020   Chronic kidney disease, stage 3b ([place]) 04/15/2020   Severe tobacco dependence 04/12/2020   Lower urinary tract symptoms (LUTS) 04/12/2020   History of colon polyps 04/12/2020   Atherosclerosis of native arteries of extremity with intermittent claudication ([place]) 03/01/2020   Pain in both feet 07/31/2019   Status post total replacement of right hip 05/25/2019   Lumbar radiculopathy 02/05/2019   Past Medical History:  Diagnosis Date   Alcoholism ([place])    Arthritis    Cataract    Colon polyps    Complication of anesthesia    HLD (hyperlipidemia)    Nephritis    when pt. was 7 yrs. ols   Peripheral vascular disease (HCC)    PONV (postoperative nausea and vomiting)    "Only when I have aortagram on 06/23/20".   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   Substance abuse (HCC)     Family History  Problem Relation Age of Onset  Pulmonary fibrosis Mother    Breast cancer Mother    Hyperlipidemia Mother    Hypertension Mother    Other Mother        Scarlet Fever   Heart disease Father    Hyperlipidemia Father    Diabetes Brother    Hypertension Brother    Breast cancer Maternal Aunt    Prostate cancer Paternal Grandfather    Hypothyroidism Daughter    Colon cancer Neg Hx    Esophageal cancer Neg Hx    Ulcerative colitis Neg Hx    Uterine cancer Neg Hx     Past Surgical History:  Procedure Laterality Date   ABDOMINAL AORTOGRAM W/LOWER EXTREMITY N/A 06/23/2020   Procedure: ABDOMINAL AORTOGRAM W/LOWER EXTREMITY;  Surgeon: [name], MD;  Location: [facility];  Service: Cardiovascular;  Laterality: N/A;   ANTERIOR LAT LUMBAR FUSION Right  02/05/2019   Procedure: ATTEMPTED ANTERIOR LATERAL INTERBODY FUSION;  Surgeon: Consuella Lose, MD;  Location: [facility];  Service: Neurosurgery;  Laterality: Right;  RIGHT LATERAL TRANSPSOAS DISCECTOMY AND FUSION WITH ROBOTIC ASSISTED PEDICLE SCREW STABILIZATION, LUMBAR 4- LUMBAR 5   CATARACT EXTRACTION W/ INTRAOCULAR LENS  IMPLANT, BILATERAL Bilateral 2013   lens implants for cataracts after Lasik   COLONOSCOPY     ENDOVEIN HARVEST OF GREATER SAPHENOUS VEIN Left 06/29/2020   Procedure: HARVEST OF LEFT GREATER SAPHENOUS VEIN;  Surgeon: [name], MD;  Location: [facility];  Service: Vascular;  Laterality: Left;   FEMORAL-POPLITEAL BYPASS GRAFT Left 06/29/2020   Procedure: BYPASS GRAFT LEFT COMMON FEMORAL TO ABOVE KNEE POPLITEAL ARTERY;  Surgeon: [name], MD;  Location: [facility];  Service: Vascular;  Laterality: Left;   HEMORRHOID SURGERY N/A 04/06/2021   Procedure: HEMORRHOIDECTOMY 2 COLUMN;  Surgeon: Ileana Roup, MD;  Location: [facility];  Service: General;  Laterality: N/A;   KNEE ARTHROSCOPY Right [place] N/A 04/06/2021   Procedure: ANORECTAL EXAM UNDER ANESTHESIA/ excision of anal polyp;  Surgeon: Ileana Roup, MD;  Location: [facility];  Service: General;  Laterality: N/A;   TONSILLECTOMY     at a very young age, but grew back   TOTAL HIP ARTHROPLASTY Right 05/25/2019   Procedure: RIGHT TOTAL HIP ARTHROPLASTY ANTERIOR APPROACH;  Surgeon: Leandrew Koyanagi, MD;  Location: [facility];  Service: Orthopedics;  Laterality: Right;   TOTAL KNEE ARTHROPLASTY Right 09/04/2021   Procedure: RIGHT TOTAL KNEE ARTHROPLASTY;  Surgeon: Leandrew Koyanagi, MD;  Location: [facility];  Service: Orthopedics;  Laterality: Right;   Social History   Occupational History   Occupation: retired  Tobacco Use   Smoking status: Every Day    Packs/day: 0.25    Years: 53.00    Total pack years: 13.25    Types: Cigarettes   Smokeless  tobacco: Never   Tobacco comments:    had first cigarettes age  31, habit started at age 50/today he 75 cig a day  Vaping Use   Vaping Use: Never used  Substance and Sexual Activity   Alcohol use: Not Currently    Comment: 05/10/1991   Drug use: Not Currently    Comment: prior usage of "speed"  when drinking   Sexual activity: Yes

## 2022-03-23 ENCOUNTER — Encounter
Payer: Medicare Other | Attending: Physical Medicine and Rehabilitation | Admitting: Physical Medicine and Rehabilitation

## 2022-03-23 ENCOUNTER — Encounter: Payer: Self-pay | Admitting: Physical Medicine and Rehabilitation

## 2022-03-23 VITALS — BP 121/70 | HR 87 | Ht 67.0 in | Wt 161.0 lb

## 2022-03-23 DIAGNOSIS — L909 Atrophic disorder of skin, unspecified: Secondary | ICD-10-CM | POA: Diagnosis present

## 2022-03-23 DIAGNOSIS — M545 Low back pain, unspecified: Secondary | ICD-10-CM | POA: Diagnosis present

## 2022-03-23 DIAGNOSIS — M25551 Pain in right hip: Secondary | ICD-10-CM | POA: Diagnosis present

## 2022-03-23 DIAGNOSIS — M25552 Pain in left hip: Secondary | ICD-10-CM | POA: Diagnosis present

## 2022-03-23 NOTE — Progress Notes (Signed)
Subjective:    Patient ID: Gerald Carpenter, male    DOB: 1951/02/11, 72 y.o.   MRN: [id number]  HPI Gerald Carpenter is a 72 year old man who presents for follow-up of PAD with legs locking up.  -He is s/p bypass- he does not note improvements in the leg after the bypass -He has decreased sensation in his left lower leg and pain. -pain is 4/10 on average and right now. Feels intermittent, dull, aching. -He does not take any medications for pain -He is a recovering alcoholic.  -he is an active smoker -his back has been killing him, also in is hip and buttock.  -2 years of benefit form his hip and back surgeries -pain is 4-5/10 -the therapy helped his walking and flexibility.  -hamstring is very tight and his wife has been helping to stretch this out.  -he does not want any pain medication, he would rather be in pain.  -his wife is returning to the [employer] on 12/29. They plan to joint the gym together.   1) Knee replacement -did PT following surgery  2) Bilateral fat pad atrophy  3) bilateral hip pin -present when he walks -he was referred to another physician -had an MRI done on his back and pathology was noted at levels 3-4.  -has has a bulge at level 3-4.  -Dr. Heron Nay recommended that he follow here -rest helps.  -sometimes can only walk short distances, other times regularly.    Pain Inventory Average Pain 3 Pain Right Now 3 My pain is intermittent and aching  In the last 24 hours, has pain interfered with the following? General activity 8 Relation with others 8 Enjoyment of life 10 What TIME of day is your pain at its worst? morning , daytime, evening, and night Sleep (in general) Fair  Pain is worse with: walking and standing Pain improves with: rest and standing Relief from Meds: 0     Family History  Problem Relation Age of Onset   Pulmonary fibrosis Mother    Breast cancer Mother    Hyperlipidemia Mother    Hypertension Mother    Other Mother         Scarlet Fever   Heart disease Father    Hyperlipidemia Father    Diabetes Brother    Hypertension Brother    Breast cancer Maternal Aunt    Prostate cancer Paternal Grandfather    Hypothyroidism Daughter    Colon cancer Neg Hx    Esophageal cancer Neg Hx    Ulcerative colitis Neg Hx    Uterine cancer Neg Hx    Social History   Socioeconomic History   Marital status: Married    Spouse name: Gerald Carpenter   Number of children: 3   Years of education: Not on file   Highest education level: Not on file  Occupational History   Occupation: retired  Tobacco Use   Smoking status: Every Day    Packs/day: 0.25    Years: 53.00    Total pack years: 13.25    Types: Cigarettes   Smokeless tobacco: Never   Tobacco comments:    had first cigarettes age  72, habit started at age 70/today he 5 cig a day  Vaping Use   Vaping Use: Never used  Substance and Sexual Activity   Alcohol use: Not Currently    Comment: 05/10/1991   Drug use: Not Currently    Comment: prior usage of "speed"  when drinking   Sexual activity: Yes  Other Topics Concern   Not on file  Social History Narrative   Recently moved here from [facility], [place]   Has total of 4 children 1 adopted   Right handed   Caffeine 3 cup a day    Live in 2 story home   Social Determinants of Health   Financial Resource Strain: Low Risk  (12/20/2021)   Overall Financial Resource Strain (CARDIA)    Difficulty of Paying Living Expenses: Not hard at all  Food Insecurity: No Food Insecurity (12/20/2021)   Hunger Vital Sign    Worried About Running Out of Food in the Last Year: Never true    Ran Out of Food in the Last Year: Never true  Transportation Needs: No Transportation Needs (12/20/2021)   PRAPARE - Hydrologist (Medical): No    Lack of Transportation (Non-Medical): No  Physical Activity: Insufficiently Active (12/20/2021)   Exercise Vital Sign    Days of Exercise per Week: 3 days    Minutes of  Exercise per Session: 30 min  Stress: No Stress Concern Present (12/20/2021)   [place]    Feeling of Stress : Not at all  Social Connections: Moderately Integrated (12/20/2021)   Social Connection and Isolation Panel [NHANES]    Frequency of Communication with Friends and Family: More than three times a week    Frequency of Social Gatherings with Friends and Family: More than three times a week    Attends Religious Services: 1 to 4 times per year    Active Member of [employer] or Organizations: No    Attends Archivist Meetings: Never    Marital Status: Married   Past Surgical History:  Procedure Laterality Date   ABDOMINAL AORTOGRAM W/LOWER EXTREMITY N/A 06/23/2020   Procedure: ABDOMINAL AORTOGRAM W/LOWER EXTREMITY;  Surgeon: [name], MD;  Location: [facility];  Service: Cardiovascular;  Laterality: N/A;   ANTERIOR LAT LUMBAR FUSION Right 02/05/2019   Procedure: ATTEMPTED ANTERIOR LATERAL INTERBODY FUSION;  Surgeon: Consuella Lose, MD;  Location: [facility];  Service: Neurosurgery;  Laterality: Right;  RIGHT LATERAL TRANSPSOAS DISCECTOMY AND FUSION WITH ROBOTIC ASSISTED PEDICLE SCREW STABILIZATION, LUMBAR 4- LUMBAR 5   CATARACT EXTRACTION W/ INTRAOCULAR LENS  IMPLANT, BILATERAL Bilateral 2013   lens implants for cataracts after Lasik   COLONOSCOPY     ENDOVEIN HARVEST OF GREATER SAPHENOUS VEIN Left 06/29/2020   Procedure: HARVEST OF LEFT GREATER SAPHENOUS VEIN;  Surgeon: [name], MD;  Location: [facility];  Service: Vascular;  Laterality: Left;   FEMORAL-POPLITEAL BYPASS GRAFT Left 06/29/2020   Procedure: BYPASS GRAFT LEFT COMMON FEMORAL TO ABOVE KNEE POPLITEAL ARTERY;  Surgeon: [name], MD;  Location: [place];  Service: Vascular;  Laterality: Left;   HEMORRHOID SURGERY N/A 04/06/2021   Procedure: HEMORRHOIDECTOMY 2 COLUMN;  Surgeon: Ileana Roup, MD;  Location: [facility];  Service: General;  Laterality: N/A;   KNEE ARTHROSCOPY Right [place] N/A 04/06/2021   Procedure: ANORECTAL EXAM UNDER ANESTHESIA/ excision of anal polyp;  Surgeon: Ileana Roup, MD;  Location: [facility];  Service: General;  Laterality: N/A;   TONSILLECTOMY     at a very young age, but grew back   TOTAL HIP ARTHROPLASTY Right 05/25/2019   Procedure: RIGHT TOTAL HIP ARTHROPLASTY ANTERIOR APPROACH;  Surgeon: Leandrew Koyanagi, MD;  Location: [place];  Service: Orthopedics;  Laterality: Right;   TOTAL KNEE ARTHROPLASTY Right 09/04/2021   Procedure: RIGHT TOTAL KNEE ARTHROPLASTY;  Surgeon: Leandrew Koyanagi, MD;  Location: [facility];  Service: Orthopedics;  Laterality: Right;   Past Medical History:  Diagnosis Date   Alcoholism ([place])    Arthritis    Cataract    Colon polyps    Complication of anesthesia    HLD (hyperlipidemia)    Nephritis    when pt. was 7 yrs. ols   Peripheral vascular disease (HCC)    PONV (postoperative nausea and vomiting)    "Only when I have aortagram on 06/23/20".   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   Substance abuse (HCC)    Ht '5\' 7"'$  (1.702 m)   Wt 161 lb (73 kg)   BMI 25.22 kg/m   Opioid Risk Score:   Fall Risk Score:  `1  Depression screen [facility] 2/9     12/20/2021    9:18 AM 10/25/2021    7:57 AM 04/13/2021    9:19 AM 02/03/2021   10:09 AM 11/17/2020    9:47 AM 08/09/2020    8:31 AM 04/12/2020    9:30 AM  Depression screen [facility] 2/9  Decreased Interest 0 0 0 0 3 0 0  Down, Depressed, Hopeless 0 0 0 0 3 0 0  PHQ - 2 Score 0 0 0 0 6 0 0  Altered sleeping     1    Tired, decreased energy     3    Change in appetite     0    Feeling bad or failure about yourself      2    Trouble concentrating     0    Moving slowly or fidgety/restless     0    Suicidal thoughts     2    PHQ-9 Score     14    Difficult doing work/chores     Extremely dIfficult       Review of Systems   Constitutional: Negative.   HENT: Negative.    Eyes: Negative.   Respiratory: Negative.    Cardiovascular: Negative.   Gastrointestinal: Negative.   Endocrine: Negative.   Genitourinary: Negative.   Musculoskeletal:        Leg numbness or heaviness  Skin: Negative.   Allergic/Immunologic: Negative.   Neurological:  Positive for weakness and numbness.       Tingling  Hematological: Negative.   Psychiatric/Behavioral:  Positive for dysphoric mood and suicidal ideas.   All other systems reviewed and are negative.     Objective:   Physical Exam  Gen: no distress, normal appearing, BP 120/74, weight 155 lbs.  HEENT: oral mucosa pink and moist, NCAT Cardio: Reg rate Chest: normal effort, normal rate of breathing Abd: soft, non-distended Ext: no edema Psych: pleasant, normal affect Skin: intact Neuro:Decreased sensation in left lower extremity.  MSK: Knee popping. Normal gait, good lumbar extension and flexion.      Assessment & Plan:   Mr. Wickens is a 72 year old man who presents to establish care for lack of sensation in left leg following bypass for PAD.  1) Lack of sensation in left lower leg following bypass -Discussed Qutenza as an option for neuropathic pain control. Discussed that this is a capsaicin patch, stronger than capsaicin cream. Discussed that it is currently approved for diabetic peripheral neuropathy and post-herpetic neuralgia, but that it has also shown benefit in treating other forms of neuropathy. Provided patient  with link to site to learn more about the patch: [employer]. Discussed that the patch would be placed in office and benefits usually last 3 months. Discussed that unintended exposure to capsaicin can cause severe irritation of eyes, mucous membranes, respiratory tract, and skin, but that Qutenza is a local treatment and does not have the systemic side effects of other nerve medications. Discussed that there may be pain, itching,  erythema, and decreased sensory function associated with the application of Qutenza. Side effects usually subside within 1 week. A cold pack of analgesic medications can help with these side effects. Blood pressure can also be increased due to pain associated with administration of the patch.  -discussed smoking as a risk factor.  2) Active smoker: -discussed benefits of reducing smoking.  -discussed cold [place] -discussed use of the lozenges and the patch.  -Naltrexone made him sick -his New year's resolution is to stop smoking -he quit for a year using the patch.   3) Provided with list of supplements that can help with dyslipidemia: 1) Vitamin B3 500-4,'000mg'$  in divided doses daily (would recommend starting low as can cause uncomfortable facial flushing if started at too high a dose) 2) Phytosterols 2.15 grams daily 3) Fermented soy 30-50 grams daily 4) EGCG (found in green tea): 500-'1000mg'$  daily 5) Omega-3 fatty acids 3000-5,'000mg'$  daily 6) Flax seed 40 grams daily 7) Monounsaturated fats 20-40 grams daily (olives, olive oil, nuts), also reduces cardiovascular disease 8) Sesame: 40 grams daily 9) Gamma/delta tocotrienols- a family of unsaturated forms of Vitamin E- '200mg'$  with dinner 10) Pantethine '900mg'$  daily in divided doses 11) Resveratrol '250mg'$  daily 12) N Acetyl Cysteine '2000mg'$  daily in divided doses 13) Curcumin 2000-'5000mg'$  in divided doses daily 14) Pomegranate juice: 8 ounces daily, also helps to lower blood pressure 15) Pomegranate seeds one cup daily, also helps to lower blood pressure 16) Citrus Bergamot '1000mg'$  daily, also helps with glucose control and weight loss 17) Vitamin C '500mg'$  daily 18) Quercetin 500-'1000mg'$  daily 19) Glutathione 20) Probiotics 60-100 billion organisms per day 21) Fiber 22) Oats 23) Aged garlic (can eat as food or supplement of 600-'900mg'$  per day) 24) Chia seeds 25 grams per day 25) Lycopene- carotenoid found in high concentrations in  tomatoes. 26) Alpha linolenic acid 27) Flavonoids and anthocyanins 28) Wogonin- flavanoid that enhances reverse cholesterol transport 29) Coenzyme Q10 30) Pantethine- derivative of Vitamin B5: '300mg'$  three times per day or '450mg'$  twice per day with or without food 31) Barley and other whole grains 32) Orange juice 33) L- carnitine 34) L- Lysine 35) L- Arginine 36) Almonds 37) Morin 38) Rutin 39) Carnosine 40) Histidine  41) Kaempferol  42) Organosulfur compounds 43) Vitamin E 44) Oleic acid 45) RBO (ferulic acid gammaoryzanol) 46) grape seed extract 47) Red wine 48) Berberine HCL '500mg'$  daily or twice per day- more effective and with fewer adverse effects that ezetimibe monotherapy 49) red yeast rice 2400- 4800 mg/day 50) chlorella 51) Licorice   4) Right knee pain and popping: -had good benefit from a gel injection 1.5 years ago.  -encouraged repeat viscosupplementation if pain in knee worsens -Provided with a pain relief journal and discussed that it contains foods and lifestyle tips to naturally help to improve pain. Discussed that these lifestyle strategies are also very good for health unlike some medications which can have negative side effects. Discussed that the act of keeping a journal can be therapeutic and helpful to realize patterns what helps to trigger and alleviate pain.    5)  Low back pain -discussed lidocaine patch but he defers.  MRI of spine reviewed with him -continue gabapentin  6) Fat pad atrophy: -continue gabapentin  7) Bilateral hip pain:  -discussed current pain, prior hip surgery.  -discussed that could be secondary to lumbar facet arthropathy/retrolisthesis.  -will refer to PT. If not effective, will consider medial branch blocks

## 2022-04-03 ENCOUNTER — Other Ambulatory Visit: Payer: Self-pay | Admitting: Podiatry

## 2022-04-03 NOTE — Therapy (Unsigned)
OUTPATIENT PHYSICAL THERAPY THORACOLUMBAR EVALUATION   Patient Name: Macade Robarts MRN: [id number] DOB:1950/06/23, 72 y.o., male Today's Date: 04/04/2022  END OF SESSION:  PT End of Session - 04/04/22 0940     Visit Number 1    Number of Visits 12    Date for PT Re-Evaluation 05/16/22    Authorization Type Medicare    Progress Note Due on Visit 10    PT Start Time [phone]    PT Stop Time 0928    PT Time Calculation (min) 46 min    Activity Tolerance Patient tolerated treatment well    Behavior During Therapy [facility] for tasks assessed/performed             Past Medical History:  Diagnosis Date   Alcoholism ([place])    Arthritis    Cataract    Colon polyps    Complication of anesthesia    HLD (hyperlipidemia)    Nephritis    when pt. was 7 yrs. ols   Peripheral vascular disease (HCC)    PONV (postoperative nausea and vomiting)    "Only when I have aortagram on 06/23/20".   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   Substance abuse [facility])    Past Surgical History:  Procedure Laterality Date   ABDOMINAL AORTOGRAM W/LOWER EXTREMITY N/A 06/23/2020   Procedure: ABDOMINAL AORTOGRAM W/LOWER EXTREMITY;  Surgeon: [name], MD;  Location: [place] [facility];  Service: Cardiovascular;  Laterality: N/A;   ANTERIOR LAT LUMBAR FUSION Right 02/05/2019   Procedure: ATTEMPTED ANTERIOR LATERAL INTERBODY FUSION;  Surgeon: Consuella Lose, MD;  Location: [place];  Service: Neurosurgery;  Laterality: Right;  RIGHT LATERAL TRANSPSOAS DISCECTOMY AND FUSION WITH ROBOTIC ASSISTED PEDICLE SCREW STABILIZATION, LUMBAR 4- LUMBAR 5   CATARACT EXTRACTION W/ INTRAOCULAR LENS  IMPLANT, BILATERAL Bilateral 2013   lens implants for cataracts after Lasik   COLONOSCOPY     ENDOVEIN HARVEST OF GREATER SAPHENOUS VEIN Left 06/29/2020   Procedure: HARVEST OF LEFT GREATER SAPHENOUS VEIN;  Surgeon: [name], MD;  Location: [place];  Service: Vascular;  Laterality: Left;    FEMORAL-POPLITEAL BYPASS GRAFT Left 06/29/2020   Procedure: BYPASS GRAFT LEFT COMMON FEMORAL TO ABOVE KNEE POPLITEAL ARTERY;  Surgeon: [name], MD;  Location: [place];  Service: Vascular;  Laterality: Left;   HEMORRHOID SURGERY N/A 04/06/2021   Procedure: HEMORRHOIDECTOMY 2 COLUMN;  Surgeon: Ileana Roup, MD;  Location: [place];  Service: General;  Laterality: N/A;   KNEE ARTHROSCOPY Right [place] N/A 04/06/2021   Procedure: ANORECTAL EXAM UNDER ANESTHESIA/ excision of anal polyp;  Surgeon: Ileana Roup, MD;  Location: [facility];  Service: General;  Laterality: N/A;   TONSILLECTOMY     at a very young age, but grew back   TOTAL HIP ARTHROPLASTY Right 05/25/2019   Procedure: RIGHT TOTAL HIP ARTHROPLASTY ANTERIOR APPROACH;  Surgeon: Leandrew Koyanagi, MD;  Location: [place];  Service: Orthopedics;  Laterality: Right;   TOTAL KNEE ARTHROPLASTY Right 09/04/2021   Procedure: RIGHT TOTAL KNEE ARTHROPLASTY;  Surgeon: Leandrew Koyanagi, MD;  Location: [place];  Service: Orthopedics;  Laterality: Right;   Patient Active Problem List   Diagnosis Date Noted   Status post total right knee replacement 09/04/2021   Leukocytosis 10/11/2020   PAD (peripheral artery disease) ([place]) 06/29/2020   Prediabetes 04/22/2020   Chronic kidney disease, stage 3b ([place]) 04/15/2020   Severe tobacco dependence 04/12/2020   Lower urinary  tract symptoms (LUTS) 04/12/2020   History of colon polyps 04/12/2020   Atherosclerosis of native arteries of extremity with intermittent claudication ([place]) 03/01/2020   Pain in both feet 07/31/2019   Status post total replacement of right hip 05/25/2019   Lumbar radiculopathy 02/05/2019     REFERRING PROVIDER: Izora Ribas, MD  REFERRING DIAG: Bilateral hip pain [M25.551, M25.552], Low back pain without sciatica, unspecified back pain laterality, unspecified chronicity [M54.50]   Rationale for  Evaluation and Treatment: Rehabilitation  THERAPY DIAG:  Difficulty in walking, not elsewhere classified - Plan: PT plan of care cert/re-cert  Muscle weakness (generalized) - Plan: PT plan of care cert/re-cert  Muscle spasm of back - Plan: PT plan of care cert/re-cert  Pain in right hip - Plan: PT plan of care cert/re-cert  Pain in left hip - Plan: PT plan of care cert/re-cert  ONSET DATE: 3+ years ago after back surgery    SUBJECTIVE:                                                                                                                                                                                           SUBJECTIVE STATEMENT: Pt has an extensive history of lower back, hip, knee and foot pain. He reports getting a his L4/L5 fused in Dec 2020, a partial THA on the R side in 2021, and a R TKA July of 2023. Pt also reports chronic foot pain that has been present for many years. He has received multiple MRI's and conservative treatments with no resolution. Pt is unable to pin point what is causing his bilateral hip pain, but states that it started soon after his initial lumbar surgery.   Pt currently reports a sharp pain in bilat hips resulting in him stopping in his tracks. He notes most pain with walking. Pain is sharp that results in a numbness radiating down bilat LE's that resolves with rest. Pt is requesting to do aquatic therapy at this time.   PERTINENT HISTORY:  Alcoholism, Arthritis, Cataracts, PVD, OA of R hip, substance abuse.   PAIN:  Are you having pain? Yes: NPRS scale: 7-8/10 Pain location: Bilat hips and into feet.  Pain description: Numbness in bilat LE's.  Aggravating factors: Walking  Relieving factors: Rest, tylenol   PRECAUTIONS: None  WEIGHT BEARING RESTRICTIONS: No  FALLS:  Has patient fallen in last 6 months? No  LIVING ENVIRONMENT: Lives with: lives with their spouse Lives in: House/apartment Stairs: Yes: Internal: 14 steps; on right going  up and External: 4 steps; none Has following equipment at home: None  OCCUPATION: Retired   PLOF: Independent  PATIENT GOALS:  Pt would like to be able to resolve his bilat hip pain and walk increased distances without familiar pain.    OBJECTIVE:   DIAGNOSTIC FINDINGS:  1. Status post posterior instrumented fusion and decompression at L4-L5 without residual spinal canal or neural foraminal stenosis. 2. Adjacent segment disease at L3-L4 with trace retrolisthesis, disc bulge, and bilateral facet arthropathy resulting in mild spinal canal and bilateral subarticular zone without evidence of frank nerve root impingement, and mild left worse than right neural foraminal stenosis. 3. Mild left worse than right subarticular zone narrowing L2-L3.  PATIENT SURVEYS:  [place] is down, report next session.   SCREENING FOR RED FLAGS: Bowel or bladder incontinence: No  COGNITION: Overall cognitive status: Within functional limits for tasks assessed   SENSATION: WFL  POSTURE: No Significant postural limitations  PALPATION: No TTP  LUMBAR ROM:   AROM eval  Flexion 85%   Extension WFL  Right lateral flexion WFL  Left lateral flexion WFL  Right rotation WFL  Left rotation WFL   (Blank rows = not tested)  LOWER EXTREMITY ROM:     Active  Right eval Left eval  Hip flexion [facility]  Hip internal rotation [facility] WFL  Hip external rotation [facility]  Knee flexion [facility] WFL  Knee extension WFL WFL   (Blank rows = not tested)  LOWER EXTREMITY MMT:    MMT Right eval Left eval  Hip flexion 4 4+  Knee flexion 4+ 5  Knee extension 4+ gave way due to pain from residual TKA 5   (Blank rows = not tested)  FUNCTIONAL TESTS:  3 minute walk test: 436 ft prior to excruciating pain Symptoms started at 1 min 43 sec requiring pt to stop for a few seconds prior to resuming.   GAIT: Distance walked: 436 ft Assistive device utilized: None Level of assistance: Complete  Independence Comments: Pt walks with a slow antalgic gait pattern with less stance time on his R LE.   Today's Treatment:   Eval: 04/04/2022:  Access Code: [email or web address] URL: [email or web address] Date: 04/04/2022 Prepared by: Rudi Heap  Exercises - Seated Hamstring Stretch  - 1 x daily - 7 x weekly - 2 sets - 2 reps - 30 hold - Supine Bridge  - 2 x daily - 7 x weekly - 2 sets - 10 reps - Supine Lower Trunk Rotation  - 2 x daily - 7 x weekly - 2 sets - 10 reps  ASSESSMENT:  CLINICAL IMPRESSION: Patient referred to PT for bilat hip and lower back pain. He has an extensive history of surgeries between his lower back, R hip, R knee and chronic pain in his bilat feet. Pt demonstrates residual weakness in his R LE and an antalgic gait pattern with onset of bilat hip pain after 1.40 min of walking. Patient will benefit from skilled PT to address below impairments, limitations and improve overall function.  OBJECTIVE IMPAIRMENTS: decreased activity tolerance, difficulty walking, decreased balance, decreased endurance, decreased mobility, decreased ROM, decreased strength, impaired flexibility, impaired UE/LE use, postural dysfunction, and pain.  ACTIVITY LIMITATIONS: bending, lifting, carry, locomotion, cleaning, community activity, driving, and or occupation  PERSONAL FACTORS: Alcoholism, Arthritis, Cataracts, PVD, OA of R hip, substance abuse.  are also affecting patient's functional outcome.  REHAB POTENTIAL: Good  CLINICAL DECISION MAKING: Evolving/moderate complexity  EVALUATION COMPLEXITY: Moderate    GOALS: Short term PT Goals Target date: 04/18/2022 Pt will be I and compliant with HEP. Baseline:  Goal status: New Pt will decrease pain  by 25% with active walking.  Baseline: Goal status: New  Long term PT goals Target date: 05/16/2022 Pt will improve  hip/knee strength to at least 5-/5 MMT to improve functional strength Baseline: Goal status: New Pt will improve  FOTO to at least .... % functional to show improved function Baseline: Goal status: New Pt will reduce pain by overall 50% overall with usual activity Baseline: Goal status: New Pt will reduce pain to overall less than 3-4/10 with usual activity and work activity. Baseline: Goal status: New Pt will be able to ambulate community distances at least 1000 ft WNL gait pattern without complaints of pain.  Baseline: Goal status: New  PLAN: PT FREQUENCY: 2 times per week   PT DURATION: 6 weeks  PLANNED INTERVENTIONS (unless contraindicated): aquatic PT, Canalith repositioning, cryotherapy, Electrical stimulation, Iontophoresis with 4 mg/ml dexamethasome, Moist heat, traction, Ultrasound, gait training, Therapeutic exercise, balance training, neuromuscular re-education, patient/family education, prosthetic training, manual techniques, passive ROM, dry needling, taping, vasopnuematic device, vestibular, spinal manipulations, joint manipulations  PLAN FOR NEXT SESSION: update/ review HEP. Strengthen bilat proximal hip's, core and improve gait mechanics.     Lynden Ang, PT 04/04/2022, 9:40 AM

## 2022-04-04 ENCOUNTER — Ambulatory Visit (INDEPENDENT_AMBULATORY_CARE_PROVIDER_SITE_OTHER): Payer: Medicare Other | Admitting: Physical Therapy

## 2022-04-04 ENCOUNTER — Encounter: Payer: Self-pay | Admitting: Physical Therapy

## 2022-04-04 ENCOUNTER — Other Ambulatory Visit: Payer: Self-pay

## 2022-04-04 DIAGNOSIS — M62830 Muscle spasm of back: Secondary | ICD-10-CM

## 2022-04-04 DIAGNOSIS — R262 Difficulty in walking, not elsewhere classified: Secondary | ICD-10-CM | POA: Diagnosis not present

## 2022-04-04 DIAGNOSIS — M25552 Pain in left hip: Secondary | ICD-10-CM

## 2022-04-04 DIAGNOSIS — M6281 Muscle weakness (generalized): Secondary | ICD-10-CM

## 2022-04-04 DIAGNOSIS — M25551 Pain in right hip: Secondary | ICD-10-CM | POA: Diagnosis not present

## 2022-04-11 ENCOUNTER — Encounter: Payer: Self-pay | Admitting: Physical Therapy

## 2022-04-11 ENCOUNTER — Ambulatory Visit (INDEPENDENT_AMBULATORY_CARE_PROVIDER_SITE_OTHER): Payer: Medicare Other | Admitting: Physical Therapy

## 2022-04-11 DIAGNOSIS — M62830 Muscle spasm of back: Secondary | ICD-10-CM

## 2022-04-11 DIAGNOSIS — M25552 Pain in left hip: Secondary | ICD-10-CM

## 2022-04-11 DIAGNOSIS — R262 Difficulty in walking, not elsewhere classified: Secondary | ICD-10-CM

## 2022-04-11 DIAGNOSIS — M25551 Pain in right hip: Secondary | ICD-10-CM | POA: Diagnosis not present

## 2022-04-11 DIAGNOSIS — M6281 Muscle weakness (generalized): Secondary | ICD-10-CM | POA: Diagnosis not present

## 2022-04-11 NOTE — Therapy (Signed)
OUTPATIENT PHYSICAL THERAPY THORACOLUMBAR TREATMENT    Patient Name: Gerald Carpenter MRN: [id number] DOB:[birth year], 72 y.o., male Today's Date: 04/11/2022  END OF SESSION:  PT End of Session - 04/11/22 [id number]     Visit Number 2    Number of Visits 12    Date for PT Re-Evaluation 05/16/22    Authorization Type Medicare    Progress Note Due on Visit 10    PT Start Time 0932    PT Stop Time 1012    PT Time Calculation (min) 40 min    Activity Tolerance Patient tolerated treatment well    Behavior During Therapy WFL for tasks assessed/performed              Past Medical History:  Diagnosis Date   Alcoholism ([place])    Arthritis    Cataract    Colon polyps    Complication of anesthesia    HLD (hyperlipidemia)    Nephritis    when pt. was 7 yrs. ols   Peripheral vascular disease (HCC)    PONV (postoperative nausea and vomiting)    "Only when I have aortagram on 06/23/20".   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   Substance abuse [facility])    Past Surgical History:  Procedure Laterality Date   ABDOMINAL AORTOGRAM W/LOWER EXTREMITY N/A 06/23/2020   Procedure: ABDOMINAL AORTOGRAM W/LOWER EXTREMITY;  Surgeon: [name], MD;  Location: [place] [facility];  Service: Cardiovascular;  Laterality: N/A;   ANTERIOR LAT LUMBAR FUSION Right 02/05/2019   Procedure: ATTEMPTED ANTERIOR LATERAL INTERBODY FUSION;  Surgeon: Consuella Lose, MD;  Location: [place];  Service: Neurosurgery;  Laterality: Right;  RIGHT LATERAL TRANSPSOAS DISCECTOMY AND FUSION WITH ROBOTIC ASSISTED PEDICLE SCREW STABILIZATION, LUMBAR 4- LUMBAR 5   CATARACT EXTRACTION W/ INTRAOCULAR LENS  IMPLANT, BILATERAL Bilateral 2013   lens implants for cataracts after Lasik   COLONOSCOPY     ENDOVEIN HARVEST OF GREATER SAPHENOUS VEIN Left 06/29/2020   Procedure: HARVEST OF LEFT GREATER SAPHENOUS VEIN;  Surgeon: [name], MD;  Location: [place];  Service: Vascular;  Laterality: Left;    FEMORAL-POPLITEAL BYPASS GRAFT Left 06/29/2020   Procedure: BYPASS GRAFT LEFT COMMON FEMORAL TO ABOVE KNEE POPLITEAL ARTERY;  Surgeon: [name], MD;  Location: [place];  Service: Vascular;  Laterality: Left;   HEMORRHOID SURGERY N/A 04/06/2021   Procedure: HEMORRHOIDECTOMY 2 COLUMN;  Surgeon: Ileana Roup, MD;  Location: [place];  Service: General;  Laterality: N/A;   KNEE ARTHROSCOPY Right [place] N/A 04/06/2021   Procedure: ANORECTAL EXAM UNDER ANESTHESIA/ excision of anal polyp;  Surgeon: Ileana Roup, MD;  Location: [facility];  Service: General;  Laterality: N/A;   TONSILLECTOMY     at a very young age, but grew back   TOTAL HIP ARTHROPLASTY Right 05/25/2019   Procedure: RIGHT TOTAL HIP ARTHROPLASTY ANTERIOR APPROACH;  Surgeon: Leandrew Koyanagi, MD;  Location: [place];  Service: Orthopedics;  Laterality: Right;   TOTAL KNEE ARTHROPLASTY Right 09/04/2021   Procedure: RIGHT TOTAL KNEE ARTHROPLASTY;  Surgeon: Leandrew Koyanagi, MD;  Location: [place];  Service: Orthopedics;  Laterality: Right;   Patient Active Problem List   Diagnosis Date Noted   Status post total right knee replacement 09/04/2021   Leukocytosis 10/11/2020   PAD (peripheral artery disease) ([place]) 06/29/2020   Prediabetes 04/22/2020   Chronic kidney disease, stage 3b ([place]) 04/15/2020   Severe tobacco dependence 04/12/2020  Lower urinary tract symptoms (LUTS) 04/12/2020   History of colon polyps 04/12/2020   Atherosclerosis of native arteries of extremity with intermittent claudication ([place]) 03/01/2020   Pain in both feet 07/31/2019   Status post total replacement of right hip 05/25/2019   Lumbar radiculopathy 02/05/2019     REFERRING PROVIDER: Izora Ribas, MD  REFERRING DIAG: Bilateral hip pain [M25.551, M25.552], Low back pain without sciatica, unspecified back pain laterality, unspecified chronicity [M54.50]   Rationale for  Evaluation and Treatment: Rehabilitation  THERAPY DIAG:  Difficulty in walking, not elsewhere classified  Muscle weakness (generalized)  Pain in right hip  Pain in left hip  Muscle spasm of back  ONSET DATE: 3+ years ago after back surgery    SUBJECTIVE:                                                                                                                                                                                           SUBJECTIVE STATEMENT:  I've been having severe hip pain since the back surgery about 3 years ago, maybe what happened is the surgeries were so close together maybe the muscles didn't get built up. Water therapy was mentioned at some point but wanted to try land therapy first, if we don't see results maybe we can try one day in water PT/one day on land PT a week. Pain is variable.   PERTINENT HISTORY:  Alcoholism, Arthritis, Cataracts, PVD, OA of R hip, substance abuse.   PAIN:  Are you having pain? Yes: NPRS scale: 2/10 Pain location: Bilat hips on both sides  Pain description: aching, hard to describe "sometimes it is really sharp pain out of nowhere, sometimes it gradually builds"  Aggravating factors: walking any distance  Relieving factors: tylenol and rest   PRECAUTIONS: None  WEIGHT BEARING RESTRICTIONS: No  FALLS:  Has patient fallen in last 6 months? No  LIVING ENVIRONMENT: Lives with: lives with their spouse Lives in: House/apartment Stairs: Yes: Internal: 14 steps; on right going up and External: 4 steps; none Has following equipment at home: None  OCCUPATION: Retired   PLOF: Independent  PATIENT GOALS: Pt would like to be able to resolve his bilat hip pain and walk increased distances without familiar pain.    OBJECTIVE:   DIAGNOSTIC FINDINGS:  1. Status post posterior instrumented fusion and decompression at L4-L5 without residual spinal canal or neural foraminal stenosis. 2. Adjacent segment disease at L3-L4 with  trace retrolisthesis, disc bulge, and bilateral facet arthropathy resulting in mild spinal canal and bilateral subarticular zone without evidence of frank nerve root impingement, and mild left worse than right neural foraminal  stenosis. 3. Mild left worse than right subarticular zone narrowing L2-L3.  PATIENT SURVEYS:  [place] is down, report next session.   SCREENING FOR RED FLAGS: Bowel or bladder incontinence: No  COGNITION: Overall cognitive status: Within functional limits for tasks assessed   SENSATION: WFL  POSTURE: No Significant postural limitations  PALPATION: No TTP  LUMBAR ROM:   AROM eval  Flexion 85%   Extension WFL  Right lateral flexion WFL  Left lateral flexion WFL  Right rotation WFL  Left rotation WFL   (Blank rows = not tested)  LOWER EXTREMITY ROM:     Active  Right eval Left eval  Hip flexion [facility]  Hip internal rotation [facility] WFL  Hip external rotation [facility]  Knee flexion [facility] WFL  Knee extension WFL WFL   (Blank rows = not tested)  LOWER EXTREMITY MMT:    MMT Right eval Left eval  Hip flexion 4 4+  Knee flexion 4+ 5  Knee extension 4+ gave way due to pain from residual TKA 5   (Blank rows = not tested)  FUNCTIONAL TESTS:  3 minute walk test: 436 ft prior to excruciating pain Symptoms started at 1 min 43 sec requiring pt to stop for a few seconds prior to resuming.   GAIT: Distance walked: 436 ft Assistive device utilized: None Level of assistance: Complete Independence Comments: Pt walks with a slow antalgic gait pattern with less stance time on his R LE.   Today's Treatment:   04/11/22  TherEx  Bridges + ABD into red TB x15 3 second holds  Sidelying hip ABD red TB x10 B Mod cues for form  Figure 4 stretches B 3x30 seconds supine Walking bridges x10 PPT with 3 second holds x15 seconds Hip flexor stretch 3x30 seconds R LE PPT + march x10      Eval: 04/04/2022:  Access Code: [email or web address] URL:  [email or web address] Date: 04/04/2022 Prepared by: Rudi Heap  Exercises - Seated Hamstring Stretch  - 1 x daily - 7 x weekly - 2 sets - 2 reps - 30 hold - Supine Bridge  - 2 x daily - 7 x weekly - 2 sets - 10 reps - Supine Lower Trunk Rotation  - 2 x daily - 7 x weekly - 2 sets - 10 reps  ASSESSMENT:  CLINICAL IMPRESSION:   Mr. Strimple arrives today doing OK, still having a lot of pain in hips especially with walking. Worked on hip and core strengthening primarily today. Might still be a candidate for water therapy moving forward, will monitor closely.   OBJECTIVE IMPAIRMENTS: decreased activity tolerance, difficulty walking, decreased balance, decreased endurance, decreased mobility, decreased ROM, decreased strength, impaired flexibility, impaired UE/LE use, postural dysfunction, and pain.  ACTIVITY LIMITATIONS: bending, lifting, carry, locomotion, cleaning, community activity, driving, and or occupation  PERSONAL FACTORS: Alcoholism, Arthritis, Cataracts, PVD, OA of R hip, substance abuse.  are also affecting patient's functional outcome.  REHAB POTENTIAL: Good  CLINICAL DECISION MAKING: Evolving/moderate complexity  EVALUATION COMPLEXITY: Moderate    GOALS: Short term PT Goals Target date: 04/18/2022 Pt will be I and compliant with HEP. Baseline:  Goal status: New Pt will decrease pain by 25% with active walking.  Baseline: Goal status: New  Long term PT goals Target date: 05/16/2022 Pt will improve  hip/knee strength to at least 5-/5 MMT to improve functional strength Baseline: Goal status: New Pt will improve FOTO to at least .... % functional to show improved function Baseline: Goal status: New Pt  will reduce pain by overall 50% overall with usual activity Baseline: Goal status: New Pt will reduce pain to overall less than 3-4/10 with usual activity and work activity. Baseline: Goal status: New Pt will be able to ambulate community distances at  least 1000 ft WNL gait pattern without complaints of pain.  Baseline: Goal status: New  PLAN: PT FREQUENCY: 2 times per week   PT DURATION: 6 weeks  PLANNED INTERVENTIONS (unless contraindicated): aquatic PT, Canalith repositioning, cryotherapy, Electrical stimulation, Iontophoresis with 4 mg/ml dexamethasome, Moist heat, traction, Ultrasound, gait training, Therapeutic exercise, balance training, neuromuscular re-education, patient/family education, prosthetic training, manual techniques, passive ROM, dry needling, taping, vasopnuematic device, vestibular, spinal manipulations, joint manipulations  PLAN FOR NEXT SESSION: update/ review HEP. Strengthen bilat proximal hip's, core and improve gait mechanics.    Deniece Ree PT DPT PN2

## 2022-04-16 ENCOUNTER — Encounter: Payer: Self-pay | Admitting: Physical Therapy

## 2022-04-16 ENCOUNTER — Ambulatory Visit (INDEPENDENT_AMBULATORY_CARE_PROVIDER_SITE_OTHER): Payer: Medicare Other | Admitting: Physical Therapy

## 2022-04-16 DIAGNOSIS — R262 Difficulty in walking, not elsewhere classified: Secondary | ICD-10-CM | POA: Diagnosis not present

## 2022-04-16 DIAGNOSIS — M6281 Muscle weakness (generalized): Secondary | ICD-10-CM

## 2022-04-16 DIAGNOSIS — M25551 Pain in right hip: Secondary | ICD-10-CM | POA: Diagnosis not present

## 2022-04-16 DIAGNOSIS — M25552 Pain in left hip: Secondary | ICD-10-CM | POA: Diagnosis not present

## 2022-04-16 NOTE — Therapy (Deleted)
OUTPATIENT PHYSICAL THERAPY THORACOLUMBAR TREATMENT    Patient Name: Gerald Carpenter MRN: [id number] DOB:1950-03-31, 72 y.o., male Today's Date: 04/16/2022  END OF SESSION:      Past Medical History:  Diagnosis Date   Alcoholism ([place])    Arthritis    Cataract    Colon polyps    Complication of anesthesia    HLD (hyperlipidemia)    Nephritis    when pt. was 7 yrs. ols   Peripheral vascular disease (HCC)    PONV (postoperative nausea and vomiting)    "Only when I have aortagram on 06/23/20".   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   Substance abuse [facility])    Past Surgical History:  Procedure Laterality Date   ABDOMINAL AORTOGRAM W/LOWER EXTREMITY N/A 06/23/2020   Procedure: ABDOMINAL AORTOGRAM W/LOWER EXTREMITY;  Surgeon: [name], MD;  Location: [place] [facility];  Service: Cardiovascular;  Laterality: N/A;   ANTERIOR LAT LUMBAR FUSION Right 02/05/2019   Procedure: ATTEMPTED ANTERIOR LATERAL INTERBODY FUSION;  Surgeon: Consuella Lose, MD;  Location: [place];  Service: Neurosurgery;  Laterality: Right;  RIGHT LATERAL TRANSPSOAS DISCECTOMY AND FUSION WITH ROBOTIC ASSISTED PEDICLE SCREW STABILIZATION, LUMBAR 4- LUMBAR 5   CATARACT EXTRACTION W/ INTRAOCULAR LENS  IMPLANT, BILATERAL Bilateral 2013   lens implants for cataracts after Lasik   COLONOSCOPY     ENDOVEIN HARVEST OF GREATER SAPHENOUS VEIN Left 06/29/2020   Procedure: HARVEST OF LEFT GREATER SAPHENOUS VEIN;  Surgeon: [name], MD;  Location: [place];  Service: Vascular;  Laterality: Left;   FEMORAL-POPLITEAL BYPASS GRAFT Left 06/29/2020   Procedure: BYPASS GRAFT LEFT COMMON FEMORAL TO ABOVE KNEE POPLITEAL ARTERY;  Surgeon: [name], MD;  Location: [place];  Service: Vascular;  Laterality: Left;   HEMORRHOID SURGERY N/A 04/06/2021   Procedure: HEMORRHOIDECTOMY 2 COLUMN;  Surgeon: Ileana Roup, MD;  Location: [place];  Service: General;  Laterality: N/A;    KNEE ARTHROSCOPY Right [place] N/A 04/06/2021   Procedure: ANORECTAL EXAM UNDER ANESTHESIA/ excision of anal polyp;  Surgeon: Ileana Roup, MD;  Location: [facility];  Service: General;  Laterality: N/A;   TONSILLECTOMY     at a very young age, but grew back   TOTAL HIP ARTHROPLASTY Right 05/25/2019   Procedure: RIGHT TOTAL HIP ARTHROPLASTY ANTERIOR APPROACH;  Surgeon: Leandrew Koyanagi, MD;  Location: [place];  Service: Orthopedics;  Laterality: Right;   TOTAL KNEE ARTHROPLASTY Right 09/04/2021   Procedure: RIGHT TOTAL KNEE ARTHROPLASTY;  Surgeon: Leandrew Koyanagi, MD;  Location: [place];  Service: Orthopedics;  Laterality: Right;   Patient Active Problem List   Diagnosis Date Noted   Status post total right knee replacement 09/04/2021   Leukocytosis 10/11/2020   PAD (peripheral artery disease) ([place]) 06/29/2020   Prediabetes 04/22/2020   Chronic kidney disease, stage 3b ([place]) 04/15/2020   Severe tobacco dependence 04/12/2020   Lower urinary tract symptoms (LUTS) 04/12/2020   History of colon polyps 04/12/2020   Atherosclerosis of native arteries of extremity with intermittent claudication ([place]) 03/01/2020   Pain in both feet 07/31/2019   Status post total replacement of right hip 05/25/2019   Lumbar radiculopathy 02/05/2019     REFERRING PROVIDER: Izora Ribas, MD  REFERRING DIAG: Bilateral hip pain [M25.551, M25.552], Low back pain without sciatica, unspecified back pain laterality, unspecified chronicity [M54.50]   Rationale for Evaluation and Treatment: Rehabilitation  THERAPY DIAG:  No diagnosis found.  ONSET  DATE: 3+ years ago after back surgery    SUBJECTIVE:                                                                                                                                                                                           SUBJECTIVE STATEMENT:  Nothing new, maybe feeling a little better since we've been  stretching out.   PERTINENT HISTORY:  Alcoholism, Arthritis, Cataracts, PVD, OA of R hip, substance abuse.   PAIN:  Are you having pain? No just stiffness/tightness  PRECAUTIONS: None  WEIGHT BEARING RESTRICTIONS: No  FALLS:  Has patient fallen in last 6 months? No  LIVING ENVIRONMENT: Lives with: lives with their spouse Lives in: House/apartment Stairs: Yes: Internal: 14 steps; on right going up and External: 4 steps; none Has following equipment at home: None  OCCUPATION: Retired   PLOF: Independent  PATIENT GOALS: Pt would like to be able to resolve his bilat hip pain and walk increased distances without familiar pain.    OBJECTIVE:   DIAGNOSTIC FINDINGS:  1. Status post posterior instrumented fusion and decompression at L4-L5 without residual spinal canal or neural foraminal stenosis. 2. Adjacent segment disease at L3-L4 with trace retrolisthesis, disc bulge, and bilateral facet arthropathy resulting in mild spinal canal and bilateral subarticular zone without evidence of frank nerve root impingement, and mild left worse than right neural foraminal stenosis. 3. Mild left worse than right subarticular zone narrowing L2-L3.  PATIENT SURVEYS:  McKittrick is down, report next session.   SCREENING FOR RED FLAGS: Bowel or bladder incontinence: No  COGNITION: Overall cognitive status: Within functional limits for tasks assessed   SENSATION: WFL  POSTURE: No Significant postural limitations  PALPATION: No TTP  LUMBAR ROM:   AROM eval  Flexion 85%   Extension WFL  Right lateral flexion WFL  Left lateral flexion WFL  Right rotation WFL  Left rotation WFL   (Blank rows = not tested)  LOWER EXTREMITY ROM:     Active  Right eval Left eval  Hip flexion [facility]  Hip internal rotation [facility] WFL  Hip external rotation [facility]  Knee flexion [facility] WFL  Knee extension WFL WFL   (Blank rows = not tested)  LOWER EXTREMITY MMT:    MMT Right eval Left eval   Hip flexion 4 4+  Knee flexion 4+ 5  Knee extension 4+ gave way due to pain from residual TKA 5   (Blank rows = not tested)  FUNCTIONAL TESTS:  3 minute walk test: 436 ft prior to excruciating pain Symptoms started at 1 min 43 sec requiring pt to stop for a  few seconds prior to resuming.   GAIT: Distance walked: 436 ft Assistive device utilized: None Level of assistance: Complete Independence Comments: Pt walks with a slow antalgic gait pattern with less stance time on his R LE.   Today's Treatment:   04/16/22  TherEx  Nustep L5x6 minutes BLEs only Walking bridges x15 Bridge + red TB x15 Piriformis stretches 2x30 seconds B Hip flexor stretches 2x30 seconds B Standing hip excursions x20 each direction STS with red TB above knees x12 no UEs      04/11/22  TherEx  Bridges + ABD into red TB x15 3 second holds  Sidelying hip ABD red TB x10 B Mod cues for form  Figure 4 stretches B 3x30 seconds supine Walking bridges x10 PPT with 3 second holds x15 seconds Hip flexor stretch 3x30 seconds R LE PPT + march x10      Eval: 04/04/2022:   Access Code: [email or web address] URL: [email or web address] Date: 04/16/2022 Prepared by: Deniece Ree  Exercises - Seated Hamstring Stretch  - 1 x daily - 7 x weekly - 2 sets - 2 reps - 30 hold - Supine Bridge  - 2 x daily - 7 x weekly - 2 sets - 10 reps - Supine Lower Trunk Rotation  - 2 x daily - 7 x weekly - 2 sets - 10 reps - Bridge Walk Out  - 1 x daily - 7 x weekly - 3 sets - 10 reps - Bridge with Abduction and Resistance Loop  - 1 x daily - 7 x weekly - 3 sets - 10 reps - Supine Figure 4 Piriformis Stretch  - 1 x daily - 7 x weekly - 3 sets - 10 reps  ASSESSMENT:  CLINICAL IMPRESSION:   Mr. Labarca arrives today doing better, seems like mobility and flexibility work we have been doing are improving pain. Warmed up on Nustep then continued to focus on mobility/flexibility/strength as appropriate. Updated HEP as well.  Will continue efforts.   OBJECTIVE IMPAIRMENTS: decreased activity tolerance, difficulty walking, decreased balance, decreased endurance, decreased mobility, decreased ROM, decreased strength, impaired flexibility, impaired UE/LE use, postural dysfunction, and pain.  ACTIVITY LIMITATIONS: bending, lifting, carry, locomotion, cleaning, community activity, driving, and or occupation  PERSONAL FACTORS: Alcoholism, Arthritis, Cataracts, PVD, OA of R hip, substance abuse.  are also affecting patient's functional outcome.  REHAB POTENTIAL: Good  CLINICAL DECISION MAKING: Evolving/moderate complexity  EVALUATION COMPLEXITY: Moderate    GOALS: Short term PT Goals Target date: 04/18/2022 Pt will be I and compliant with HEP. Baseline:  Goal status: New Pt will decrease pain by 25% with active walking.  Baseline: Goal status: New  Long term PT goals Target date: 05/16/2022 Pt will improve  hip/knee strength to at least 5-/5 MMT to improve functional strength Baseline: Goal status: New Pt will improve FOTO to at least .... % functional to show improved function Baseline: Goal status: New Pt will reduce pain by overall 50% overall with usual activity Baseline: Goal status: New Pt will reduce pain to overall less than 3-4/10 with usual activity and work activity. Baseline: Goal status: New Pt will be able to ambulate community distances at least 1000 ft WNL gait pattern without complaints of pain.  Baseline: Goal status: New  PLAN: PT FREQUENCY: 2 times per week   PT DURATION: 6 weeks  PLANNED INTERVENTIONS (unless contraindicated): aquatic PT, Canalith repositioning, cryotherapy, Electrical stimulation, Iontophoresis with 4 mg/ml dexamethasome, Moist heat, traction, Ultrasound, gait training, Therapeutic exercise, balance training, neuromuscular re-education, patient/family education, prosthetic  training, manual techniques, passive ROM, dry needling, taping, vasopnuematic device,  vestibular, spinal manipulations, joint manipulations  PLAN FOR NEXT SESSION: update/ review HEP. Strengthen bilat proximal hip's, core and improve gait mechanics. Needs more appts    Rudi Heap PT, DPT 04/16/22  3:37 PM

## 2022-04-16 NOTE — Therapy (Signed)
OUTPATIENT PHYSICAL THERAPY THORACOLUMBAR TREATMENT    Patient Name: Gerald Carpenter MRN: [id number] DOB:Feb 26, 1950, 72 y.o., male Today's Date: 04/16/2022  END OF SESSION:  PT End of Session - 04/16/22 1025     Visit Number 3    Number of Visits 12    Date for PT Re-Evaluation 05/16/22    Authorization Type Medicare    Progress Note Due on Visit 10    PT Start Time 1018    PT Stop Time 1056    PT Time Calculation (min) 38 min    Activity Tolerance Patient tolerated treatment well    Behavior During Therapy WFL for tasks assessed/performed               Past Medical History:  Diagnosis Date   Alcoholism (Gerald Carpenter)    Arthritis    Cataract    Colon polyps    Complication of anesthesia    HLD (hyperlipidemia)    Nephritis    when pt. was 7 yrs. ols   Peripheral vascular disease (HCC)    PONV (postoperative nausea and vomiting)    "Only when I have aortagram on 06/23/20".   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   Substance abuse [facility])    Past Surgical History:  Procedure Laterality Date   ABDOMINAL AORTOGRAM W/LOWER EXTREMITY N/A 06/23/2020   Procedure: ABDOMINAL AORTOGRAM W/LOWER EXTREMITY;  Surgeon: Gerald Heck, MD;  Location: [place] [facility];  Service: Cardiovascular;  Laterality: N/A;   ANTERIOR LAT LUMBAR FUSION Right 02/05/2019   Procedure: ATTEMPTED ANTERIOR LATERAL INTERBODY FUSION;  Surgeon: Gerald Lose, MD;  Location: [place];  Service: Neurosurgery;  Laterality: Right;  RIGHT LATERAL TRANSPSOAS DISCECTOMY AND FUSION WITH ROBOTIC ASSISTED PEDICLE SCREW STABILIZATION, LUMBAR 4- LUMBAR 5   CATARACT EXTRACTION W/ INTRAOCULAR LENS  IMPLANT, BILATERAL Bilateral 2013   lens implants for cataracts after Lasik   COLONOSCOPY     ENDOVEIN HARVEST OF GREATER SAPHENOUS VEIN Left 06/29/2020   Procedure: HARVEST OF LEFT GREATER SAPHENOUS VEIN;  Surgeon: Gerald Heck, MD;  Location: [place];  Service: Vascular;  Laterality: Left;    FEMORAL-POPLITEAL BYPASS GRAFT Left 06/29/2020   Procedure: BYPASS GRAFT LEFT COMMON FEMORAL TO ABOVE KNEE POPLITEAL ARTERY;  Surgeon: Gerald Heck, MD;  Location: [place];  Service: Vascular;  Laterality: Left;   HEMORRHOID SURGERY N/A 04/06/2021   Procedure: HEMORRHOIDECTOMY 2 COLUMN;  Surgeon: Gerald Roup, MD;  Location: [place];  Service: General;  Laterality: N/A;   KNEE ARTHROSCOPY Right [place] N/A 04/06/2021   Procedure: ANORECTAL EXAM UNDER ANESTHESIA/ excision of anal polyp;  Surgeon: Gerald Roup, MD;  Location: [facility];  Service: General;  Laterality: N/A;   TONSILLECTOMY     at a very young age, but grew back   TOTAL HIP ARTHROPLASTY Right 05/25/2019   Procedure: RIGHT TOTAL HIP ARTHROPLASTY ANTERIOR APPROACH;  Surgeon: Gerald Koyanagi, MD;  Location: [place];  Service: Orthopedics;  Laterality: Right;   TOTAL KNEE ARTHROPLASTY Right 09/04/2021   Procedure: RIGHT TOTAL KNEE ARTHROPLASTY;  Surgeon: Gerald Koyanagi, MD;  Location: [place];  Service: Orthopedics;  Laterality: Right;   Patient Active Problem List   Diagnosis Date Noted   Status post total right knee replacement 09/04/2021   Leukocytosis 10/11/2020   PAD (peripheral artery disease) ([place]) 06/29/2020   Prediabetes 04/22/2020   Chronic kidney disease, stage 3b ([place]) 04/15/2020   Severe tobacco dependence 04/12/2020  Lower urinary tract symptoms (LUTS) 04/12/2020   History of colon polyps 04/12/2020   Atherosclerosis of native arteries of extremity with intermittent claudication ([place]) 03/01/2020   Pain in both feet 07/31/2019   Status post total replacement of right hip 05/25/2019   Lumbar radiculopathy 02/05/2019     REFERRING PROVIDER: Izora Ribas, MD  REFERRING DIAG: Bilateral hip pain [M25.551, M25.552], Low back pain without sciatica, unspecified back pain laterality, unspecified chronicity [M54.50]   Rationale for  Evaluation and Treatment: Rehabilitation  THERAPY DIAG:  Difficulty in walking, not elsewhere classified  Muscle weakness (generalized)  Pain in right hip  Pain in left hip  ONSET DATE: 3+ years ago after back surgery    SUBJECTIVE:                                                                                                                                                                                           SUBJECTIVE STATEMENT:  Nothing new, maybe feeling a little better since we've been stretching out.   PERTINENT HISTORY:  Alcoholism, Arthritis, Cataracts, PVD, OA of R hip, substance abuse.   PAIN:  Are you having pain? No just stiffness/tightness  PRECAUTIONS: None  WEIGHT BEARING RESTRICTIONS: No  FALLS:  Has patient fallen in last 6 months? No  LIVING ENVIRONMENT: Lives with: lives with their spouse Lives in: House/apartment Stairs: Yes: Internal: 14 steps; on right going up and External: 4 steps; none Has following equipment at home: None  OCCUPATION: Retired   PLOF: Independent  PATIENT GOALS: Pt would like to be able to resolve his bilat hip pain and walk increased distances without familiar pain.    OBJECTIVE:   DIAGNOSTIC FINDINGS:  1. Status post posterior instrumented fusion and decompression at L4-L5 without residual spinal canal or neural foraminal stenosis. 2. Adjacent segment disease at L3-L4 with trace retrolisthesis, disc bulge, and bilateral facet arthropathy resulting in mild spinal canal and bilateral subarticular zone without evidence of frank nerve root impingement, and mild left worse than right neural foraminal stenosis. 3. Mild left worse than right subarticular zone narrowing L2-L3.  PATIENT SURVEYS:  Gerald Carpenter is down, report next session.   SCREENING FOR RED FLAGS: Bowel or bladder incontinence: No  COGNITION: Overall cognitive status: Within functional limits for tasks assessed   SENSATION: WFL  POSTURE: No  Significant postural limitations  PALPATION: No TTP  LUMBAR ROM:   AROM eval  Flexion 85%   Extension WFL  Right lateral flexion WFL  Left lateral flexion WFL  Right rotation WFL  Left rotation WFL   (Blank rows = not tested)  LOWER EXTREMITY ROM:  Active  Right eval Left eval  Hip flexion [facility]  Hip internal rotation [facility]  Hip external rotation [facility]  Knee flexion [facility] WFL  Knee extension WFL WFL   (Blank rows = not tested)  LOWER EXTREMITY MMT:    MMT Right eval Left eval  Hip flexion 4 4+  Knee flexion 4+ 5  Knee extension 4+ gave way due to pain from residual TKA 5   (Blank rows = not tested)  FUNCTIONAL TESTS:  3 minute walk test: 436 ft prior to excruciating pain Symptoms started at 1 min 43 sec requiring pt to stop for a few seconds prior to resuming.   GAIT: Distance walked: 436 ft Assistive device utilized: None Level of assistance: Complete Independence Comments: Pt walks with a slow antalgic gait pattern with less stance time on his R LE.   Today's Treatment:   04/16/22  TherEx  Nustep L5x6 minutes BLEs only Walking bridges x15 Bridge + red TB x15 Piriformis stretches 2x30 seconds B Hip flexor stretches 2x30 seconds B Standing hip excursions x20 each direction STS with red TB above knees x12 no UEs      04/11/22  TherEx  Bridges + ABD into red TB x15 3 second holds  Sidelying hip ABD red TB x10 B Mod cues for form  Figure 4 stretches B 3x30 seconds supine Walking bridges x10 PPT with 3 second holds x15 seconds Hip flexor stretch 3x30 seconds R LE PPT + march x10      Eval: 04/04/2022:   Access Code: [email or web address] URL: [email or web address] Date: 04/16/2022 Prepared by: Deniece Ree  Exercises - Seated Hamstring Stretch  - 1 x daily - 7 x weekly - 2 sets - 2 reps - 30 hold - Supine Bridge  - 2 x daily - 7 x weekly - 2 sets - 10 reps - Supine Lower Trunk Rotation  - 2 x daily - 7 x weekly - 2 sets -  10 reps - Bridge Walk Out  - 1 x daily - 7 x weekly - 3 sets - 10 reps - Bridge with Abduction and Resistance Loop  - 1 x daily - 7 x weekly - 3 sets - 10 reps - Supine Figure 4 Piriformis Stretch  - 1 x daily - 7 x weekly - 3 sets - 10 reps  ASSESSMENT:  CLINICAL IMPRESSION:   Mr. Obermann arrives today doing better, seems like mobility and flexibility work we have been doing are improving pain. Warmed up on Nustep then continued to focus on mobility/flexibility/strength as appropriate. Updated HEP as well. Will continue efforts.   OBJECTIVE IMPAIRMENTS: decreased activity tolerance, difficulty walking, decreased balance, decreased endurance, decreased mobility, decreased ROM, decreased strength, impaired flexibility, impaired UE/LE use, postural dysfunction, and pain.  ACTIVITY LIMITATIONS: bending, lifting, carry, locomotion, cleaning, community activity, driving, and or occupation  PERSONAL FACTORS: Alcoholism, Arthritis, Cataracts, PVD, OA of R hip, substance abuse.  are also affecting patient's functional outcome.  REHAB POTENTIAL: Good  CLINICAL DECISION MAKING: Evolving/moderate complexity  EVALUATION COMPLEXITY: Moderate    GOALS: Short term PT Goals Target date: 04/18/2022 Pt will be I and compliant with HEP. Baseline:  Goal status: New Pt will decrease pain by 25% with active walking.  Baseline: Goal status: New  Long term PT goals Target date: 05/16/2022 Pt will improve  hip/knee strength to at least 5-/5 MMT to improve functional strength Baseline: Goal status: New Pt will improve FOTO to at least .... % functional to show improved  function Baseline: Goal status: New Pt will reduce pain by overall 50% overall with usual activity Baseline: Goal status: New Pt will reduce pain to overall less than 3-4/10 with usual activity and work activity. Baseline: Goal status: New Pt will be able to ambulate community distances at least 1000 ft WNL gait pattern without  complaints of pain.  Baseline: Goal status: New  PLAN: PT FREQUENCY: 2 times per week   PT DURATION: 6 weeks  PLANNED INTERVENTIONS (unless contraindicated): aquatic PT, Canalith repositioning, cryotherapy, Electrical stimulation, Iontophoresis with 4 mg/ml dexamethasome, Moist heat, traction, Ultrasound, gait training, Therapeutic exercise, balance training, neuromuscular re-education, patient/family education, prosthetic training, manual techniques, passive ROM, dry needling, taping, vasopnuematic device, vestibular, spinal manipulations, joint manipulations  PLAN FOR NEXT SESSION: update/ review HEP. Strengthen bilat proximal hip's, core and improve gait mechanics. Needs more appts    Deniece Ree PT DPT PN2

## 2022-04-18 ENCOUNTER — Encounter: Payer: Self-pay | Admitting: Physical Therapy

## 2022-04-18 ENCOUNTER — Encounter: Payer: Medicare Other | Admitting: Physical Therapy

## 2022-04-18 ENCOUNTER — Ambulatory Visit (INDEPENDENT_AMBULATORY_CARE_PROVIDER_SITE_OTHER): Payer: Medicare Other | Admitting: Physical Therapy

## 2022-04-18 DIAGNOSIS — R262 Difficulty in walking, not elsewhere classified: Secondary | ICD-10-CM | POA: Diagnosis not present

## 2022-04-18 DIAGNOSIS — M25551 Pain in right hip: Secondary | ICD-10-CM | POA: Diagnosis not present

## 2022-04-18 DIAGNOSIS — M6281 Muscle weakness (generalized): Secondary | ICD-10-CM | POA: Diagnosis not present

## 2022-04-18 DIAGNOSIS — M25552 Pain in left hip: Secondary | ICD-10-CM

## 2022-04-18 NOTE — Therapy (Signed)
OUTPATIENT PHYSICAL THERAPY THORACOLUMBAR TREATMENT    Patient Name: Gerald Carpenter MRN: [id number] DOB:1950/05/18, 72 y.o., male Today's Date: 04/18/2022  END OF SESSION:  PT End of Session - 04/18/22 0938     Visit Number 4    Number of Visits 12    Date for PT Re-Evaluation 05/16/22    Authorization Type Medicare    Progress Note Due on Visit 10    PT Start Time 0932    PT Stop Time 1016    PT Time Calculation (min) 44 min    Activity Tolerance Patient tolerated treatment well    Behavior During Therapy WFL for tasks assessed/performed                Past Medical History:  Diagnosis Date   Alcoholism ([place])    Arthritis    Cataract    Colon polyps    Complication of anesthesia    HLD (hyperlipidemia)    Nephritis    when pt. was 7 yrs. ols   Peripheral vascular disease (HCC)    PONV (postoperative nausea and vomiting)    "Only when I have aortagram on 06/23/20".   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   Substance abuse [facility])    Past Surgical History:  Procedure Laterality Date   ABDOMINAL AORTOGRAM W/LOWER EXTREMITY N/A 06/23/2020   Procedure: ABDOMINAL AORTOGRAM W/LOWER EXTREMITY;  Surgeon: [name], MD;  Location: [place] [facility];  Service: Cardiovascular;  Laterality: N/A;   ANTERIOR LAT LUMBAR FUSION Right 02/05/2019   Procedure: ATTEMPTED ANTERIOR LATERAL INTERBODY FUSION;  Surgeon: Consuella Lose, MD;  Location: [place];  Service: Neurosurgery;  Laterality: Right;  RIGHT LATERAL TRANSPSOAS DISCECTOMY AND FUSION WITH ROBOTIC ASSISTED PEDICLE SCREW STABILIZATION, LUMBAR 4- LUMBAR 5   CATARACT EXTRACTION W/ INTRAOCULAR LENS  IMPLANT, BILATERAL Bilateral 2013   lens implants for cataracts after Lasik   COLONOSCOPY     ENDOVEIN HARVEST OF GREATER SAPHENOUS VEIN Left 06/29/2020   Procedure: HARVEST OF LEFT GREATER SAPHENOUS VEIN;  Surgeon: [name], MD;  Location: [place];  Service: Vascular;  Laterality: Left;    FEMORAL-POPLITEAL BYPASS GRAFT Left 06/29/2020   Procedure: BYPASS GRAFT LEFT COMMON FEMORAL TO ABOVE KNEE POPLITEAL ARTERY;  Surgeon: [name], MD;  Location: [place];  Service: Vascular;  Laterality: Left;   HEMORRHOID SURGERY N/A 04/06/2021   Procedure: HEMORRHOIDECTOMY 2 COLUMN;  Surgeon: Ileana Roup, MD;  Location: [place];  Service: General;  Laterality: N/A;   KNEE ARTHROSCOPY Right [place] N/A 04/06/2021   Procedure: ANORECTAL EXAM UNDER ANESTHESIA/ excision of anal polyp;  Surgeon: Ileana Roup, MD;  Location: [facility];  Service: General;  Laterality: N/A;   TONSILLECTOMY     at a very young age, but grew back   TOTAL HIP ARTHROPLASTY Right 05/25/2019   Procedure: RIGHT TOTAL HIP ARTHROPLASTY ANTERIOR APPROACH;  Surgeon: Leandrew Koyanagi, MD;  Location: [place];  Service: Orthopedics;  Laterality: Right;   TOTAL KNEE ARTHROPLASTY Right 09/04/2021   Procedure: RIGHT TOTAL KNEE ARTHROPLASTY;  Surgeon: Leandrew Koyanagi, MD;  Location: [place];  Service: Orthopedics;  Laterality: Right;   Patient Active Problem List   Diagnosis Date Noted   Status post total right knee replacement 09/04/2021   Leukocytosis 10/11/2020   PAD (peripheral artery disease) ([place]) 06/29/2020   Prediabetes 04/22/2020   Chronic kidney disease, stage 3b ([place]) 04/15/2020   Severe tobacco dependence 04/12/2020  Lower urinary tract symptoms (LUTS) 04/12/2020   History of colon polyps 04/12/2020   Atherosclerosis of native arteries of extremity with intermittent claudication ([place]) 03/01/2020   Pain in both feet 07/31/2019   Status post total replacement of right hip 05/25/2019   Lumbar radiculopathy 02/05/2019     REFERRING PROVIDER: Izora Ribas, MD  REFERRING DIAG: Bilateral hip pain [M25.551, M25.552], Low back pain without sciatica, unspecified back pain laterality, unspecified chronicity [M54.50]   Rationale for  Evaluation and Treatment: Rehabilitation  THERAPY DIAG:  Difficulty in walking, not elsewhere classified  Muscle weakness (generalized)  Pain in right hip  Pain in left hip  ONSET DATE: 3+ years ago after back surgery    SUBJECTIVE:                                                                                                                                                                                           SUBJECTIVE STATEMENT:  Felt a little stiff after last time but also drove the rest of the day bc I had to do something for my brother. Otherwise OK.   PERTINENT HISTORY:  Alcoholism, Arthritis, Cataracts, PVD, OA of R hip, substance abuse.   PAIN:  Are you having pain? No just stiffness/tightness  PRECAUTIONS: None  WEIGHT BEARING RESTRICTIONS: No  FALLS:  Has patient fallen in last 6 months? No  LIVING ENVIRONMENT: Lives with: lives with their spouse Lives in: House/apartment Stairs: Yes: Internal: 14 steps; on right going up and External: 4 steps; none Has following equipment at home: None  OCCUPATION: Retired   PLOF: Independent  PATIENT GOALS: Pt would like to be able to resolve his bilat hip pain and walk increased distances without familiar pain.    OBJECTIVE:   DIAGNOSTIC FINDINGS:  1. Status post posterior instrumented fusion and decompression at L4-L5 without residual spinal canal or neural foraminal stenosis. 2. Adjacent segment disease at L3-L4 with trace retrolisthesis, disc bulge, and bilateral facet arthropathy resulting in mild spinal canal and bilateral subarticular zone without evidence of frank nerve root impingement, and mild left worse than right neural foraminal stenosis. 3. Mild left worse than right subarticular zone narrowing L2-L3.  PATIENT SURVEYS:  [place] is down, report next session.   SCREENING FOR RED FLAGS: Bowel or bladder incontinence: No  COGNITION: Overall cognitive status: Within functional limits for  tasks assessed   SENSATION: WFL  POSTURE: No Significant postural limitations  PALPATION: No TTP  LUMBAR ROM:   AROM eval  Flexion 85%   Extension WFL  Right lateral flexion WFL  Left lateral flexion WFL  Right rotation [facility]  Left  rotation WFL   (Blank rows = not tested)  LOWER EXTREMITY ROM:     Active  Right eval Left eval  Hip flexion [facility]  Hip internal rotation [facility]  Hip external rotation [facility]  Knee flexion [facility] WFL  Knee extension WFL WFL   (Blank rows = not tested)  LOWER EXTREMITY MMT:    MMT Right eval Left eval  Hip flexion 4 4+  Knee flexion 4+ 5  Knee extension 4+ gave way due to pain from residual TKA 5   (Blank rows = not tested)  FUNCTIONAL TESTS:  3 minute walk test: 436 ft prior to excruciating pain Symptoms started at 1 min 43 sec requiring pt to stop for a few seconds prior to resuming.   GAIT: Distance walked: 436 ft Assistive device utilized: None Level of assistance: Complete Independence Comments: Pt walks with a slow antalgic gait pattern with less stance time on his R LE.   Today's Treatment:   04/18/22  TherEx  Nustep L5x6 BLEs only  Lumbar rotation stretch 5x5 seconds B + TA sets  SKTC 5x5 seconds B  Figure 4 stretches 3x30 seconds B  Hip flexor stretches 2x30 seconds B  QL stretch x6- 2x30 seconds midline, 2x30 seconds L, 2x30 seconds R Standing lateral hip excursions x8 B    04/16/22  TherEx  Nustep L5x6 minutes BLEs only Walking bridges x15 Bridge + red TB x15 Piriformis stretches 2x30 seconds B Hip flexor stretches 2x30 seconds B Standing hip excursions x20 each direction STS with red TB above knees x12 no UEs      04/11/22  TherEx  Bridges + ABD into red TB x15 3 second holds  Sidelying hip ABD red TB x10 B Mod cues for form  Figure 4 stretches B 3x30 seconds supine Walking bridges x10 PPT with 3 second holds x15 seconds Hip flexor stretch 3x30 seconds R LE PPT + march x10       Eval: 04/04/2022:   Access Code: [email or web address] URL: [email or web address] Date: 04/16/2022 Prepared by: Deniece Ree  Exercises - Seated Hamstring Stretch  - 1 x daily - 7 x weekly - 2 sets - 2 reps - 30 hold - Supine Bridge  - 2 x daily - 7 x weekly - 2 sets - 10 reps - Supine Lower Trunk Rotation  - 2 x daily - 7 x weekly - 2 sets - 10 reps - Bridge Walk Out  - 1 x daily - 7 x weekly - 3 sets - 10 reps - Bridge with Abduction and Resistance Loop  - 1 x daily - 7 x weekly - 3 sets - 10 reps - Supine Figure 4 Piriformis Stretch  - 1 x daily - 7 x weekly - 3 sets - 10 reps  ASSESSMENT:  CLINICAL IMPRESSION:   Mr. Capuchino arrives today doing well, sounds like he's a little more stiff today but was also in the car a lot after last session. Focused more on general mobility and flexibility to address complaints this morning. Making slow and steady progress. Encouraged morning stretches to address stiffness and improve stair navigation early in the day as he relates this is a problem as well.  Will continue efforts.    OBJECTIVE IMPAIRMENTS: decreased activity tolerance, difficulty walking, decreased balance, decreased endurance, decreased mobility, decreased ROM, decreased strength, impaired flexibility, impaired UE/LE use, postural dysfunction, and pain.  ACTIVITY LIMITATIONS: bending, lifting, carry, locomotion, cleaning, community activity, driving, and or occupation  PERSONAL FACTORS: Alcoholism,  Arthritis, Cataracts, PVD, OA of R hip, substance abuse.  are also affecting patient's functional outcome.  REHAB POTENTIAL: Good  CLINICAL DECISION MAKING: Evolving/moderate complexity  EVALUATION COMPLEXITY: Moderate    GOALS: Short term PT Goals Target date: 04/18/2022 Pt will be I and compliant with HEP. Baseline:  Goal status: New Pt will decrease pain by 25% with active walking.  Baseline: Goal status: New  Long term PT goals Target date: 05/16/2022 Pt will  improve  hip/knee strength to at least 5-/5 MMT to improve functional strength Baseline: Goal status: New Pt will improve FOTO to at least .... % functional to show improved function Baseline: Goal status: New Pt will reduce pain by overall 50% overall with usual activity Baseline: Goal status: New Pt will reduce pain to overall less than 3-4/10 with usual activity and work activity. Baseline: Goal status: New Pt will be able to ambulate community distances at least 1000 ft WNL gait pattern without complaints of pain.  Baseline: Goal status: New  PLAN: PT FREQUENCY: 2 times per week   PT DURATION: 6 weeks  PLANNED INTERVENTIONS (unless contraindicated): aquatic PT, Canalith repositioning, cryotherapy, Electrical stimulation, Iontophoresis with 4 mg/ml dexamethasome, Moist heat, traction, Ultrasound, gait training, Therapeutic exercise, balance training, neuromuscular re-education, patient/family education, prosthetic training, manual techniques, passive ROM, dry needling, taping, vasopnuematic device, vestibular, spinal manipulations, joint manipulations  PLAN FOR NEXT SESSION: update/ review HEP. Strengthen bilat proximal hip's, core and improve gait mechanics. Needs more appts    Deniece Ree PT DPT PN2

## 2022-04-20 NOTE — Therapy (Unsigned)
OUTPATIENT PHYSICAL THERAPY THORACOLUMBAR TREATMENT    Patient Name: Hermenegildo Tamminga MRN: [id number] DOB:Jan 10, 1951, 72 y.o., male Today's Date: 04/23/2022  END OF SESSION:  PT End of Session - 04/23/22 0950     Visit Number 5    Number of Visits 12    Date for PT Re-Evaluation 05/16/22    Authorization Type Medicare    Progress Note Due on Visit 10    PT Start Time 0930    PT Stop Time 1010    PT Time Calculation (min) 40 min    Activity Tolerance Patient tolerated treatment well    Behavior During Therapy WFL for tasks assessed/performed                 Past Medical History:  Diagnosis Date   Alcoholism ([place])    Arthritis    Cataract    Colon polyps    Complication of anesthesia    HLD (hyperlipidemia)    Nephritis    when pt. was 7 yrs. ols   Peripheral vascular disease (HCC)    PONV (postoperative nausea and vomiting)    "Only when I have aortagram on 06/23/20".   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   Substance abuse [facility])    Past Surgical History:  Procedure Laterality Date   ABDOMINAL AORTOGRAM W/LOWER EXTREMITY N/A 06/23/2020   Procedure: ABDOMINAL AORTOGRAM W/LOWER EXTREMITY;  Surgeon: [name], MD;  Location: [place] [facility];  Service: Cardiovascular;  Laterality: N/A;   ANTERIOR LAT LUMBAR FUSION Right 02/05/2019   Procedure: ATTEMPTED ANTERIOR LATERAL INTERBODY FUSION;  Surgeon: Consuella Lose, MD;  Location: [place];  Service: Neurosurgery;  Laterality: Right;  RIGHT LATERAL TRANSPSOAS DISCECTOMY AND FUSION WITH ROBOTIC ASSISTED PEDICLE SCREW STABILIZATION, LUMBAR 4- LUMBAR 5   CATARACT EXTRACTION W/ INTRAOCULAR LENS  IMPLANT, BILATERAL Bilateral 2013   lens implants for cataracts after Lasik   COLONOSCOPY     ENDOVEIN HARVEST OF GREATER SAPHENOUS VEIN Left 06/29/2020   Procedure: HARVEST OF LEFT GREATER SAPHENOUS VEIN;  Surgeon: [name], MD;  Location: [place];  Service: Vascular;  Laterality: Left;    FEMORAL-POPLITEAL BYPASS GRAFT Left 06/29/2020   Procedure: BYPASS GRAFT LEFT COMMON FEMORAL TO ABOVE KNEE POPLITEAL ARTERY;  Surgeon: [name], MD;  Location: [place];  Service: Vascular;  Laterality: Left;   HEMORRHOID SURGERY N/A 04/06/2021   Procedure: HEMORRHOIDECTOMY 2 COLUMN;  Surgeon: Ileana Roup, MD;  Location: [place];  Service: General;  Laterality: N/A;   KNEE ARTHROSCOPY Right [place] N/A 04/06/2021   Procedure: ANORECTAL EXAM UNDER ANESTHESIA/ excision of anal polyp;  Surgeon: Ileana Roup, MD;  Location[facility];  Service: General;  Laterality: N/A;   TONSILLECTOMY     at a very young age, but grew back   TOTAL HIP ARTHROPLASTY Right 05/25/2019   Procedure: RIGHT TOTAL HIP ARTHROPLASTY ANTERIOR APPROACH;  Surgeon: Leandrew Koyanagi, MD;  Location: [place];  Service: Orthopedics;  Laterality: Right;   TOTAL KNEE ARTHROPLASTY Right 09/04/2021   Procedure: RIGHT TOTAL KNEE ARTHROPLASTY;  Surgeon: Leandrew Koyanagi, MD;  Location: [place];  Service: Orthopedics;  Laterality: Right;   Patient Active Problem List   Diagnosis Date Noted   Status post total right knee replacement 09/04/2021   Leukocytosis 10/11/2020   PAD (peripheral artery disease) ([place]) 06/29/2020   Prediabetes 04/22/2020   Chronic kidney disease, stage 3b ([place]) 04/15/2020   Severe tobacco dependence  04/12/2020   Lower urinary tract symptoms (LUTS) 04/12/2020   History of colon polyps 04/12/2020   Atherosclerosis of native arteries of extremity with intermittent claudication ([place]) 03/01/2020   Pain in both feet 07/31/2019   Status post total replacement of right hip 05/25/2019   Lumbar radiculopathy 02/05/2019     REFERRING PROVIDER: Izora Ribas, MD  REFERRING DIAG: Bilateral hip pain [M25.551, M25.552], Low back pain without sciatica, unspecified back pain laterality, unspecified chronicity [M54.50]   Rationale for  Evaluation and Treatment: Rehabilitation  THERAPY DIAG:  Difficulty in walking, not elsewhere classified  Muscle weakness (generalized)  Pain in right hip  Pain in left hip  ONSET DATE: 3+ years ago after back surgery    SUBJECTIVE:                                                                                                                                                                                           SUBJECTIVE STATEMENT:  I was able to walk around town this weekend with my wife and her friends. It has been a long time since I have been able to do that, so it felt good. I am stiff today, but minimal pain.   PERTINENT HISTORY:  Alcoholism, Arthritis, Cataracts, PVD, OA of R hip, substance abuse.   PAIN:  Are you having pain? No just stiffness/tightness  PRECAUTIONS: None  WEIGHT BEARING RESTRICTIONS: No  FALLS:  Has patient fallen in last 6 months? No  LIVING ENVIRONMENT: Lives with: lives with their spouse Lives in: House/apartment Stairs: Yes: Internal: 14 steps; on right going up and External: 4 steps; none Has following equipment at home: None  OCCUPATION: Retired   PLOF: Independent  PATIENT GOALS: Pt would like to be able to resolve his bilat hip pain and walk increased distances without familiar pain.    OBJECTIVE:   DIAGNOSTIC FINDINGS:  1. Status post posterior instrumented fusion and decompression at L4-L5 without residual spinal canal or neural foraminal stenosis. 2. Adjacent segment disease at L3-L4 with trace retrolisthesis, disc bulge, and bilateral facet arthropathy resulting in mild spinal canal and bilateral subarticular zone without evidence of frank nerve root impingement, and mild left worse than right neural foraminal stenosis. 3. Mild left worse than right subarticular zone narrowing L2-L3.  PATIENT SURVEYS:  [place] is down, report next session.   SCREENING FOR RED FLAGS: Bowel or bladder incontinence:  No  COGNITION: Overall cognitive status: Within functional limits for tasks assessed   SENSATION: WFL  POSTURE: No Significant postural limitations  PALPATION: No TTP  LUMBAR ROM:   AROM eval  Flexion 85%   Extension  WFL  Right lateral flexion WFL  Left lateral flexion WFL  Right rotation WFL  Left rotation WFL   (Blank rows = not tested)  LOWER EXTREMITY ROM:     Active  Right eval Left eval  Hip flexion [facility]  Hip internal rotation [facility]  Hip external rotation [facility]  Knee flexion [facility] WFL  Knee extension WFL WFL   (Blank rows = not tested)  LOWER EXTREMITY MMT:    MMT Right eval Left eval  Hip flexion 4 4+  Knee flexion 4+ 5  Knee extension 4+ gave way due to pain from residual TKA 5   (Blank rows = not tested)  FUNCTIONAL TESTS:  3 minute walk test: 436 ft prior to excruciating pain Symptoms started at 1 min 43 sec requiring pt to stop for a few seconds prior to resuming.   GAIT: Distance walked: 436 ft Assistive device utilized: None Level of assistance: Complete Independence Comments: Pt walks with a slow antalgic gait pattern with less stance time on his R LE.   Today's Treatment:  04/23/22  TherEx  Nustep L5x6 BLEs only  Lumbar rotation stretch 5x5 seconds B + TA sets  SKTC 5x5 seconds B  Figure 4 stretches 2x30 seconds B  Hip flexor stretches 2x30 seconds B  LAQ 2x10 on R LE only. Focus on eccentric control.  Sit to stand from low mat 2x10 focus on eccentric control.  Standing 3 way hip, bilat x10 each motion. VC for less motion due to trunk flexion.   04/18/22  TherEx  Nustep L5x6 BLEs only  Lumbar rotation stretch 5x5 seconds B + TA sets  SKTC 5x5 seconds B  Figure 4 stretches 3x30 seconds B  Hip flexor stretches 2x30 seconds B  QL stretch x6- 2x30 seconds midline, 2x30 seconds L, 2x30 seconds R Standing lateral hip excursions x8 B    04/16/22  TherEx  Nustep L5x6 minutes BLEs only Walking bridges x15 Bridge +  red TB x15 Piriformis stretches 2x30 seconds B Hip flexor stretches 2x30 seconds B Standing hip excursions x20 each direction STS with red TB above knees x12 no UEs      04/11/22  TherEx  Bridges + ABD into red TB x15 3 second holds  Sidelying hip ABD red TB x10 B Mod cues for form  Figure 4 stretches B 3x30 seconds supine Walking bridges x10 PPT with 3 second holds x15 seconds Hip flexor stretch 3x30 seconds R LE PPT + march x10      Eval: 04/04/2022:   Access Code: [email or web address] URL: [email or web address] Date: 04/16/2022 Prepared by: Deniece Ree  Exercises - Seated Hamstring Stretch  - 1 x daily - 7 x weekly - 2 sets - 2 reps - 30 hold - Supine Bridge  - 2 x daily - 7 x weekly - 2 sets - 10 reps - Supine Lower Trunk Rotation  - 2 x daily - 7 x weekly - 2 sets - 10 reps - Bridge Walk Out  - 1 x daily - 7 x weekly - 3 sets - 10 reps - Bridge with Abduction and Resistance Loop  - 1 x daily - 7 x weekly - 3 sets - 10 reps - Supine Figure 4 Piriformis Stretch  - 1 x daily - 7 x weekly - 3 sets - 10 reps  ASSESSMENT:  CLINICAL IMPRESSION:   Mr. Spigelmyer arrives today doing well. He was able to progress with strengthening of bilat LE's with increased focus on L  quad due to ongoing weakness from previous TKA. Improvements noted with mobility today with pt reporting increased ROM without pain. Making slow and steady progress.  Will continue efforts.    OBJECTIVE IMPAIRMENTS: decreased activity tolerance, difficulty walking, decreased balance, decreased endurance, decreased mobility, decreased ROM, decreased strength, impaired flexibility, impaired UE/LE use, postural dysfunction, and pain.  ACTIVITY LIMITATIONS: bending, lifting, carry, locomotion, cleaning, community activity, driving, and or occupation  PERSONAL FACTORS: Alcoholism, Arthritis, Cataracts, PVD, OA of R hip, substance abuse.  are also affecting patient's functional outcome.  REHAB POTENTIAL:  Good  CLINICAL DECISION MAKING: Evolving/moderate complexity  EVALUATION COMPLEXITY: Moderate    GOALS: Short term PT Goals Target date: 04/18/2022 Pt will be I and compliant with HEP. Baseline:  Goal status: New Pt will decrease pain by 25% with active walking.  Baseline: Goal status: New  Long term PT goals Target date: 05/16/2022 Pt will improve  hip/knee strength to at least 5-/5 MMT to improve functional strength Baseline: Goal status: New Pt will improve FOTO to at least .... % functional to show improved function Baseline: Goal status: New Pt will reduce pain by overall 50% overall with usual activity Baseline: Goal status: New Pt will reduce pain to overall less than 3-4/10 with usual activity and work activity. Baseline: Goal status: New Pt will be able to ambulate community distances at least 1000 ft WNL gait pattern without complaints of pain.  Baseline: Goal status: New  PLAN: PT FREQUENCY: 2 times per week   PT DURATION: 6 weeks  PLANNED INTERVENTIONS (unless contraindicated): aquatic PT, Canalith repositioning, cryotherapy, Electrical stimulation, Iontophoresis with 4 mg/ml dexamethasome, Moist heat, traction, Ultrasound, gait training, Therapeutic exercise, balance training, neuromuscular re-education, patient/family education, prosthetic training, manual techniques, passive ROM, dry needling, taping, vasopnuematic device, vestibular, spinal manipulations, joint manipulations  PLAN FOR NEXT SESSION: update/ review HEP. Strengthen bilat proximal hip's, core and improve gait mechanics. Needs more appts   Rudi Heap PT, DPT 04/23/22  10:10 AM

## 2022-04-23 ENCOUNTER — Ambulatory Visit (INDEPENDENT_AMBULATORY_CARE_PROVIDER_SITE_OTHER): Payer: Medicare Other | Admitting: Physical Therapy

## 2022-04-23 DIAGNOSIS — M6281 Muscle weakness (generalized): Secondary | ICD-10-CM

## 2022-04-23 DIAGNOSIS — M25552 Pain in left hip: Secondary | ICD-10-CM

## 2022-04-23 DIAGNOSIS — R262 Difficulty in walking, not elsewhere classified: Secondary | ICD-10-CM

## 2022-04-23 DIAGNOSIS — M25551 Pain in right hip: Secondary | ICD-10-CM

## 2022-04-24 NOTE — Therapy (Unsigned)
OUTPATIENT PHYSICAL THERAPY THORACOLUMBAR TREATMENT    Patient Name: Gerald Carpenter MRN: [id number] DOB:08/06/1950, 72 y.o., male Today's Date: 04/26/2022  END OF SESSION:  PT End of Session - 04/26/22 1004     Visit Number 6    Number of Visits 12    Date for PT Re-Evaluation 05/16/22    Authorization Type Medicare    Progress Note Due on Visit 10    PT Start Time 1000    PT Stop Time 1040    PT Time Calculation (min) 40 min    Activity Tolerance Patient tolerated treatment well    Behavior During Therapy WFL for tasks assessed/performed                  Past Medical History:  Diagnosis Date   Alcoholism ([place])    Arthritis    Cataract    Colon polyps    Complication of anesthesia    HLD (hyperlipidemia)    Nephritis    when pt. was 7 yrs. ols   Peripheral vascular disease (HCC)    PONV (postoperative nausea and vomiting)    "Only when I have aortagram on 06/23/20".   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   Substance abuse [facility])    Past Surgical History:  Procedure Laterality Date   ABDOMINAL AORTOGRAM W/LOWER EXTREMITY N/A 06/23/2020   Procedure: ABDOMINAL AORTOGRAM W/LOWER EXTREMITY;  Surgeon: [name], MD;  Location: [place] [facility];  Service: Cardiovascular;  Laterality: N/A;   ANTERIOR LAT LUMBAR FUSION Right 02/05/2019   Procedure: ATTEMPTED ANTERIOR LATERAL INTERBODY FUSION;  Surgeon: Consuella Lose, MD;  Location: [place];  Service: Neurosurgery;  Laterality: Right;  RIGHT LATERAL TRANSPSOAS DISCECTOMY AND FUSION WITH ROBOTIC ASSISTED PEDICLE SCREW STABILIZATION, LUMBAR 4- LUMBAR 5   CATARACT EXTRACTION W/ INTRAOCULAR LENS  IMPLANT, BILATERAL Bilateral 2013   lens implants for cataracts after Lasik   COLONOSCOPY     ENDOVEIN HARVEST OF GREATER SAPHENOUS VEIN Left 06/29/2020   Procedure: HARVEST OF LEFT GREATER SAPHENOUS VEIN;  Surgeon: [name], MD;  Location: [place];  Service: Vascular;  Laterality: Left;    FEMORAL-POPLITEAL BYPASS GRAFT Left 06/29/2020   Procedure: BYPASS GRAFT LEFT COMMON FEMORAL TO ABOVE KNEE POPLITEAL ARTERY;  Surgeon: [name], MD;  Location: [place];  Service: Vascular;  Laterality: Left;   HEMORRHOID SURGERY N/A 04/06/2021   Procedure: HEMORRHOIDECTOMY 2 COLUMN;  Surgeon: Ileana Roup, MD;  Location: [place];  Service: General;  Laterality: N/A;   KNEE ARTHROSCOPY Right [place] N/A 04/06/2021   Procedure: ANORECTAL EXAM UNDER ANESTHESIA/ excision of anal polyp;  Surgeon: Ileana Roup, MD;  Location: [facility];  Service: General;  Laterality: N/A;   TONSILLECTOMY     at a very young age, but grew back   TOTAL HIP ARTHROPLASTY Right 05/25/2019   Procedure: RIGHT TOTAL HIP ARTHROPLASTY ANTERIOR APPROACH;  Surgeon: Leandrew Koyanagi, MD;  Location: [place];  Service: Orthopedics;  Laterality: Right;   TOTAL KNEE ARTHROPLASTY Right 09/04/2021   Procedure: RIGHT TOTAL KNEE ARTHROPLASTY;  Surgeon: Leandrew Koyanagi, MD;  Location: [place];  Service: Orthopedics;  Laterality: Right;   Patient Active Problem List   Diagnosis Date Noted   Status post total right knee replacement 09/04/2021   Leukocytosis 10/11/2020   PAD (peripheral artery disease) ([place]) 06/29/2020   Prediabetes 04/22/2020   Chronic kidney disease, stage 3a ([place]) 04/15/2020   Severe tobacco  dependence 04/12/2020   Lower urinary tract symptoms (LUTS) 04/12/2020   History of colon polyps 04/12/2020   Atherosclerosis of native arteries of extremity with intermittent claudication ([place]) 03/01/2020   Pain in both feet 07/31/2019   Status post total replacement of right hip 05/25/2019   Lumbar radiculopathy 02/05/2019     REFERRING PROVIDER: Izora Ribas, MD  REFERRING DIAG: Bilateral hip pain [M25.551, M25.552], Low back pain without sciatica, unspecified back pain laterality, unspecified chronicity [M54.50]   Rationale for  Evaluation and Treatment: Rehabilitation  THERAPY DIAG:  Difficulty in walking, not elsewhere classified  Pain in right hip  Muscle weakness (generalized)  Pain in left hip  ONSET DATE: 3+ years ago after back surgery    SUBJECTIVE:                                                                                                                                                                                           SUBJECTIVE STATEMENT:  Pt states that the pain is a lot better than it has been.   PERTINENT HISTORY:  Alcoholism, Arthritis, Cataracts, PVD, OA of R hip, substance abuse.   PAIN:  Are you having pain? No just stiffness/tightness  PRECAUTIONS: None  WEIGHT BEARING RESTRICTIONS: No  FALLS:  Has patient fallen in last 6 months? No  LIVING ENVIRONMENT: Lives with: lives with their spouse Lives in: House/apartment Stairs: Yes: Internal: 14 steps; on right going up and External: 4 steps; none Has following equipment at home: None  OCCUPATION: Retired   PLOF: Independent  PATIENT GOALS: Pt would like to be able to resolve his bilat hip pain and walk increased distances without familiar pain.    OBJECTIVE:   DIAGNOSTIC FINDINGS:  1. Status post posterior instrumented fusion and decompression at L4-L5 without residual spinal canal or neural foraminal stenosis. 2. Adjacent segment disease at L3-L4 with trace retrolisthesis, disc bulge, and bilateral facet arthropathy resulting in mild spinal canal and bilateral subarticular zone without evidence of frank nerve root impingement, and mild left worse than right neural foraminal stenosis. 3. Mild left worse than right subarticular zone narrowing L2-L3.  PATIENT SURVEYS:  [place] is down, report next session.   SCREENING FOR RED FLAGS: Bowel or bladder incontinence: No  COGNITION: Overall cognitive status: Within functional limits for tasks assessed   SENSATION: WFL  POSTURE: No Significant  postural limitations  PALPATION: No TTP  LUMBAR ROM:   AROM eval  Flexion 85%   Extension WFL  Right lateral flexion WFL  Left lateral flexion WFL  Right rotation WFL  Left rotation WFL   (Blank rows = not tested)  LOWER EXTREMITY ROM:     Active  Right eval Left eval  Hip flexion [facility]  Hip internal rotation [facility]  Hip external rotation [facility]  Knee flexion [facility] WFL  Knee extension WFL WFL   (Blank rows = not tested)  LOWER EXTREMITY MMT:    MMT Right eval Left eval  Hip flexion 4 4+  Knee flexion 4+ 5  Knee extension 4+ gave way due to pain from residual TKA 5   (Blank rows = not tested)  FUNCTIONAL TESTS:  3 minute walk test: 436 ft prior to excruciating pain Symptoms started at 1 min 43 sec requiring pt to stop for a few seconds prior to resuming.   GAIT: Distance walked: 436 ft Assistive device utilized: None Level of assistance: Complete Independence Comments: Pt walks with a slow antalgic gait pattern with less stance time on his R LE.   Today's Treatment:  04/26/22  TherEx  Nustep L5x6 BLEs only  Step up 6" step fwd/ lateral x10, bilat  Bilat leg press 100/150# 3x10 Squats to chair 2x10  LAQ with 3#, 2x10 bilat. Focus on eccentric control. Lumbar rotation stretch 5x5 seconds B + TA sets  SKTC 5x5 seconds B  Figure 4 stretches 2x30 seconds B  Hip flexor stretches 2x30 seconds B  TA activation with 5 sec hold, alt UE/LE isometric x10, bilat    04/23/22  TherEx Nustep L5x6 BLEs only  Lumbar rotation stretch 5x5 seconds B + TA sets  SKTC 5x5 seconds B  Figure 4 stretches 2x30 seconds B  Hip flexor stretches 2x30 seconds B  LAQ 2x10 on R LE only. Focus on eccentric control.  Sit to stand from low mat 2x10 focus on eccentric control.  Standing 3 way hip, bilat x10 each motion. VC for less motion due to trunk flexion.   04/18/22  TherEx  Nustep L5x6 BLEs only  Lumbar rotation stretch 5x5 seconds B + TA sets  SKTC 5x5 seconds B   Figure 4 stretches 3x30 seconds B  Hip flexor stretches 2x30 seconds B  QL stretch x6- 2x30 seconds midline, 2x30 seconds L, 2x30 seconds R Standing lateral hip excursions x8 B    04/16/22  TherEx  Nustep L5x6 minutes BLEs only Walking bridges x15 Bridge + red TB x15 Piriformis stretches 2x30 seconds B Hip flexor stretches 2x30 seconds B Standing hip excursions x20 each direction STS with red TB above knees x12 no UEs      04/11/22  TherEx  Bridges + ABD into red TB x15 3 second holds  Sidelying hip ABD red TB x10 B Mod cues for form  Figure 4 stretches B 3x30 seconds supine Walking bridges x10 PPT with 3 second holds x15 seconds Hip flexor stretch 3x30 seconds R LE PPT + march x10      Eval: 04/04/2022:   Access Code: [email or web address] URL: [email or web address] Date: 04/16/2022 Prepared by: Deniece Ree  Exercises - Seated Hamstring Stretch  - 1 x daily - 7 x weekly - 2 sets - 2 reps - 30 hold - Supine Bridge  - 2 x daily - 7 x weekly - 2 sets - 10 reps - Supine Lower Trunk Rotation  - 2 x daily - 7 x weekly - 2 sets - 10 reps - Bridge Walk Out  - 1 x daily - 7 x weekly - 3 sets - 10 reps - Bridge with Abduction and Resistance Loop  - 1 x daily - 7 x weekly - 3 sets -  10 reps - Supine Figure 4 Piriformis Stretch  - 1 x daily - 7 x weekly - 3 sets - 10 reps  ASSESSMENT:  CLINICAL IMPRESSION:   Mr. Garciaperez arrives today doing well. He was able to progress with strengthening of bilat LE's in standing today. He reports pain in feet due to secondary ongoing issues. Pt is seeking out specialized shoes to help with weight bearing and walking. Making slow and steady progress.  Will continue efforts.    OBJECTIVE IMPAIRMENTS: decreased activity tolerance, difficulty walking, decreased balance, decreased endurance, decreased mobility, decreased ROM, decreased strength, impaired flexibility, impaired UE/LE use, postural dysfunction, and pain.  ACTIVITY  LIMITATIONS: bending, lifting, carry, locomotion, cleaning, community activity, driving, and or occupation  PERSONAL FACTORS: Alcoholism, Arthritis, Cataracts, PVD, OA of R hip, substance abuse.  are also affecting patient's functional outcome.  REHAB POTENTIAL: Good  CLINICAL DECISION MAKING: Evolving/moderate complexity  EVALUATION COMPLEXITY: Moderate    GOALS: Short term PT Goals Target date: 04/18/2022 Pt will be I and compliant with HEP. Baseline:  Goal status: New Pt will decrease pain by 25% with active walking.  Baseline: Goal status: New  Long term PT goals Target date: 05/16/2022 Pt will improve  hip/knee strength to at least 5-/5 MMT to improve functional strength Baseline: Goal status: New Pt will improve FOTO to at least .... % functional to show improved function Baseline: Goal status: New Pt will reduce pain by overall 50% overall with usual activity Baseline: Goal status: New Pt will reduce pain to overall less than 3-4/10 with usual activity and work activity. Baseline: Goal status: New Pt will be able to ambulate community distances at least 1000 ft WNL gait pattern without complaints of pain.  Baseline: Goal status: New  PLAN: PT FREQUENCY: 2 times per week   PT DURATION: 6 weeks  PLANNED INTERVENTIONS (unless contraindicated): aquatic PT, Canalith repositioning, cryotherapy, Electrical stimulation, Iontophoresis with 4 mg/ml dexamethasome, Moist heat, traction, Ultrasound, gait training, Therapeutic exercise, balance training, neuromuscular re-education, patient/family education, prosthetic training, manual techniques, passive ROM, dry needling, taping, vasopnuematic device, vestibular, spinal manipulations, joint manipulations  PLAN FOR NEXT SESSION: update/ review HEP. Strengthen bilat proximal hip's, core and improve gait mechanics. Needs more appts   Rudi Heap PT, DPT 04/26/22  10:41 AM

## 2022-04-25 ENCOUNTER — Encounter: Payer: Self-pay | Admitting: Family Medicine

## 2022-04-25 ENCOUNTER — Ambulatory Visit (INDEPENDENT_AMBULATORY_CARE_PROVIDER_SITE_OTHER): Payer: Medicare Other | Admitting: Family Medicine

## 2022-04-25 VITALS — BP 116/58 | HR 70 | Temp 98.1°F | Ht 67.0 in | Wt 162.8 lb

## 2022-04-25 DIAGNOSIS — N1832 Chronic kidney disease, stage 3b: Secondary | ICD-10-CM | POA: Diagnosis not present

## 2022-04-25 DIAGNOSIS — I70212 Atherosclerosis of native arteries of extremities with intermittent claudication, left leg: Secondary | ICD-10-CM

## 2022-04-25 DIAGNOSIS — F17210 Nicotine dependence, cigarettes, uncomplicated: Secondary | ICD-10-CM | POA: Diagnosis not present

## 2022-04-25 DIAGNOSIS — R7303 Prediabetes: Secondary | ICD-10-CM

## 2022-04-25 DIAGNOSIS — F17200 Nicotine dependence, unspecified, uncomplicated: Secondary | ICD-10-CM

## 2022-04-25 LAB — BASIC METABOLIC PANEL
BUN: 19 mg/dL (ref 6–23)
CO2: 23 mEq/L (ref 19–32)
Calcium: 8.8 mg/dL (ref 8.4–10.5)
Chloride: 109 mEq/L (ref 96–112)
Creatinine, Ser: 1.31 mg/dL (ref 0.40–1.50)
GFR: 55.62 mL/min — ABNORMAL LOW (ref >60.00)
Glucose, Bld: 95 mg/dL (ref 70–99)
Potassium: 4.3 mEq/L (ref 3.5–5.1)
Sodium: 140 mEq/L (ref 135–145)

## 2022-04-25 LAB — LIPID PANEL
Cholesterol: 86 mg/dL (ref 0–200)
HDL: 19.3 mg/dL — ABNORMAL LOW (ref >39.00)
LDL Cholesterol: 46 mg/dL (ref 0–99)
NonHDL: 66.92
Total CHOL/HDL Ratio: 4
Triglycerides: 105 mg/dL (ref 0.0–149.0)
VLDL: 21 mg/dL (ref 0.0–40.0)

## 2022-04-25 LAB — HEMOGLOBIN A1C: Hgb A1c MFr Bld: 6.1 % (ref 4.6–6.5)

## 2022-04-25 NOTE — Assessment & Plan Note (Signed)
Claudication remains improved since surgery. Gerald Carpenter will continue aspirin 81 mg daily and atorvastatin 40 mg daily. I will check lipids today. I continue to advocate for him to stop smoking entirely.

## 2022-04-25 NOTE — Assessment & Plan Note (Signed)
Remains with very light smoking at this point. We did discuss the ongoing risk related to his PAD. He continues to think about completely abstaining from tobacco, but this has been hard.  I spent 3 minutes counseling the patient about tobacco cessation.

## 2022-04-25 NOTE — Progress Notes (Signed)
[facility] [address] [place] [address] Dept: [phone] Dept Fax: [phone]  Chronic Care Office Visit  Subjective:    Patient ID: Gerald Carpenter, male    DOB: [birth year], 72 y.o..   MRN: [id number]  Chief Complaint  Patient presents with   Medical Management of Chronic Issues    6 month f/u.   Fasting today. No concerns.     History of Present Illness:  Patient is in today for reassessment of chronic medical issues.  Mr. Mousel has a history of claudication due to peripheral artery disease. He underwent a left femoral to above knee popliteal artery bypass in May 2022. He is being managed medically with aspirin 81 mg daily and atorvastatin 40 mg daily.    Mr. Burker continues to smoke 1 cigarette every other day. He continues to work to stop smoking.He identifies stressors at home that make it difficult to finally quit, but has a desire to get this behind him.    Past Medical History: Patient Active Problem List   Diagnosis Date Noted   Status post total right knee replacement 09/04/2021   Leukocytosis 10/11/2020   PAD (peripheral artery disease) ([place]) 06/29/2020   Prediabetes 04/22/2020   Chronic kidney disease, stage 3b ([place]) 04/15/2020   Severe tobacco dependence 04/12/2020   Lower urinary tract symptoms (LUTS) 04/12/2020   History of colon polyps 04/12/2020   Atherosclerosis of native arteries of extremity with intermittent claudication ([place]) 03/01/2020   Pain in both feet 07/31/2019   Status post total replacement of right hip 05/25/2019   Lumbar radiculopathy 02/05/2019   Past Surgical History:  Procedure Laterality Date   ABDOMINAL AORTOGRAM W/LOWER EXTREMITY N/A 06/23/2020   Procedure: ABDOMINAL AORTOGRAM W/LOWER EXTREMITY;  Surgeon: [name], MD;  Location: [place] [facility];  Service: Cardiovascular;  Laterality: N/A;   ANTERIOR LAT LUMBAR FUSION Right 02/05/2019   Procedure:  ATTEMPTED ANTERIOR LATERAL INTERBODY FUSION;  Surgeon: Consuella Lose, MD;  Location: [place];  Service: Neurosurgery;  Laterality: Right;  RIGHT LATERAL TRANSPSOAS DISCECTOMY AND FUSION WITH ROBOTIC ASSISTED PEDICLE SCREW STABILIZATION, LUMBAR 4- LUMBAR 5   CATARACT EXTRACTION W/ INTRAOCULAR LENS  IMPLANT, BILATERAL Bilateral 2013   lens implants for cataracts after Lasik   COLONOSCOPY     ENDOVEIN HARVEST OF GREATER SAPHENOUS VEIN Left 06/29/2020   Procedure: HARVEST OF LEFT GREATER SAPHENOUS VEIN;  Surgeon: [name], MD;  Location: [place];  Service: Vascular;  Laterality: Left;   FEMORAL-POPLITEAL BYPASS GRAFT Left 06/29/2020   Procedure: BYPASS GRAFT LEFT COMMON FEMORAL TO ABOVE KNEE POPLITEAL ARTERY;  Surgeon: [name], MD;  Location: [place];  Service: Vascular;  Laterality: Left;   HEMORRHOID SURGERY N/A 04/06/2021   Procedure: HEMORRHOIDECTOMY 2 COLUMN;  Surgeon: Ileana Roup, MD;  Location: [place];  Service: General;  Laterality: N/A;   KNEE ARTHROSCOPY Right [place] N/A 04/06/2021   Procedure: ANORECTAL EXAM UNDER ANESTHESIA/ excision of anal polyp;  Surgeon: Ileana Roup, MD;  Location: [facility];  Service: General;  Laterality: N/A;   TONSILLECTOMY     at a very young age, but grew back   TOTAL HIP ARTHROPLASTY Right 05/25/2019   Procedure: RIGHT TOTAL HIP ARTHROPLASTY ANTERIOR APPROACH;  Surgeon: Leandrew Koyanagi, MD;  Location: [place];  Service: Orthopedics;  Laterality: Right;   TOTAL KNEE ARTHROPLASTY Right 09/04/2021   Procedure: RIGHT TOTAL KNEE ARTHROPLASTY;  Surgeon:  Leandrew Koyanagi, MD;  Location: [place];  Service: Orthopedics;  Laterality: Right;   Family History  Problem Relation Age of Onset   Pulmonary fibrosis Mother    Breast cancer Mother    Hyperlipidemia Mother    Hypertension Mother    Other Mother        Scarlet Fever   Heart disease Father    Hyperlipidemia  Father    Diabetes Brother    Hypertension Brother    Breast cancer Maternal Aunt    Prostate cancer Paternal Grandfather    Hypothyroidism Daughter    Colon cancer Neg Hx    Esophageal cancer Neg Hx    Ulcerative colitis Neg Hx    Uterine cancer Neg Hx    Outpatient Medications Prior to Visit  Medication Sig Dispense Refill   aspirin EC 81 MG tablet Take 1 tablet (81 mg total) by mouth 2 (two) times daily. To be taken after surgery to prevent blood clots.  Swallow whole. 84 tablet 0   atorvastatin (LIPITOR) 40 MG tablet Take 1 tablet (40 mg total) by mouth daily. 90 tablet 3   gabapentin (NEURONTIN) 300 MG capsule TAKE 1 CAPSULE BY MOUTH EVERY NIGHT AT BEDTIME FOR 30 DAYS, THEN 1 CAPSULE TWICE DAILY X30 DAYS, THEN 1 CAPSULE THREE TIMES DAILY 180 capsule 0   methocarbamol (ROBAXIN-750) 750 MG tablet Take 1 tablet (750 mg total) by mouth 2 (two) times daily as needed for muscle spasms. 20 tablet 2   ondansetron (ZOFRAN) 4 MG tablet Take 1 tablet (4 mg total) by mouth every 8 (eight) hours as needed for nausea or vomiting. 40 tablet 0   tamsulosin (FLOMAX) 0.4 MG CAPS capsule TAKE 1 CAPSULE(0.4 MG) BY MOUTH IN THE MORNING 90 capsule 3   Wheat Dextrin-Vit B6-B12-FA (BENEFIBER PLUS HEART HEALTH PO) Take 2 Sco[place] by mouth daily.     predniSONE (STERAPRED UNI-PAK 21 TAB) 10 MG (21) TBPK tablet Take as directed 21 tablet 0   No facility-administered medications prior to visit.   Allergies  Allergen Reactions   Anesthetics, Amide Nausea And Vomiting   Objective:   Today's Vitals   04/25/22 0749  BP: (!) 116/58  Pulse: 70  Temp: 98.1 F (36.7 C)  TempSrc: Temporal  SpO2: 98%  Weight: 162 lb 12.8 oz (73.8 kg)  Height: '5\' 7"'$  (1.702 m)   Body mass index is 25.5 kg/m.   General: Well developed, well nourished. No acute distress. Psych: Alert and oriented. Normal mood and affect.  Health Maintenance Due  Topic Date Due   Zoster Vaccines- Shingrix (1 of 2) Never done      Assessment & Plan:   Problem List Items Addressed This Visit       Cardiovascular and Mediastinum   Atherosclerosis of native arteries of extremity with intermittent claudication ([place]) - Primary    Claudication remains improved since surgery. Mr. Zadeh will continue aspirin 81 mg daily and atorvastatin 40 mg daily. I will check lipids today. I continue to advocate for him to stop smoking entirely.      Relevant Orders   Lipid panel     Genitourinary   Chronic kidney disease, stage 3b ([place])    I will reassess renal function today. His last eGFR was at a stage 3a level, so there may have been some mild recovery of his renal function. His BP remains in very good control. He is not taking NSAIDs.      Relevant Orders   Basic metabolic  panel     Other   Severe tobacco dependence    Remains with very light smoking at this point. We did discuss the ongoing risk related to his PAD. He continues to think about completely abstaining from tobacco, but this has been hard.  I spent 3 minutes counseling the patient about tobacco cessation.       Prediabetes    I will reassess glucose and A1c today.      Relevant Orders   Hemoglobin A1c    Return in about 6 months (around 10/26/2022) for Reassessment.   Haydee Salter, MD

## 2022-04-25 NOTE — Assessment & Plan Note (Signed)
I will reassess glucose and A1c today.

## 2022-04-25 NOTE — Assessment & Plan Note (Signed)
I will reassess renal function today. His last eGFR was at a stage 3a level, so there may have been some mild recovery of his renal function. His BP remains in very good control. He is not taking NSAIDs.

## 2022-04-26 ENCOUNTER — Ambulatory Visit (INDEPENDENT_AMBULATORY_CARE_PROVIDER_SITE_OTHER): Payer: Medicare Other | Admitting: Physical Therapy

## 2022-04-26 ENCOUNTER — Encounter: Payer: Self-pay | Admitting: Physical Therapy

## 2022-04-26 DIAGNOSIS — M25552 Pain in left hip: Secondary | ICD-10-CM | POA: Diagnosis not present

## 2022-04-26 DIAGNOSIS — R262 Difficulty in walking, not elsewhere classified: Secondary | ICD-10-CM | POA: Diagnosis not present

## 2022-04-26 DIAGNOSIS — M6281 Muscle weakness (generalized): Secondary | ICD-10-CM | POA: Diagnosis not present

## 2022-04-26 DIAGNOSIS — M25551 Pain in right hip: Secondary | ICD-10-CM

## 2022-04-27 NOTE — Therapy (Unsigned)
OUTPATIENT PHYSICAL THERAPY THORACOLUMBAR TREATMENT    Patient Name: Gerald Carpenter MRN: [id number] DOB:10/13/1950, 72 y.o., male Today's Date: 04/27/2022  END OF SESSION:         Past Medical History:  Diagnosis Date   Alcoholism ([place])    Arthritis    Cataract    Colon polyps    Complication of anesthesia    HLD (hyperlipidemia)    Nephritis    when pt. was 7 yrs. ols   Peripheral vascular disease (HCC)    PONV (postoperative nausea and vomiting)    "Only when I have aortagram on 06/23/20".   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   Substance abuse [facility])    Past Surgical History:  Procedure Laterality Date   ABDOMINAL AORTOGRAM W/LOWER EXTREMITY N/A 06/23/2020   Procedure: ABDOMINAL AORTOGRAM W/LOWER EXTREMITY;  Surgeon: [name], MD;  Location: [facility];  Service: Cardiovascular;  Laterality: N/A;   ANTERIOR LAT LUMBAR FUSION Right 02/05/2019   Procedure: ATTEMPTED ANTERIOR LATERAL INTERBODY FUSION;  Surgeon: Consuella Lose, MD;  Location: [place];  Service: Neurosurgery;  Laterality: Right;  RIGHT LATERAL TRANSPSOAS DISCECTOMY AND FUSION WITH ROBOTIC ASSISTED PEDICLE SCREW STABILIZATION, LUMBAR 4- LUMBAR 5   CATARACT EXTRACTION W/ INTRAOCULAR LENS  IMPLANT, BILATERAL Bilateral 2013   lens implants for cataracts after Lasik   COLONOSCOPY     ENDOVEIN HARVEST OF GREATER SAPHENOUS VEIN Left 06/29/2020   Procedure: HARVEST OF LEFT GREATER SAPHENOUS VEIN;  Surgeon: [name], MD;  Location: [place];  Service: Vascular;  Laterality: Left;   FEMORAL-POPLITEAL BYPASS GRAFT Left 06/29/2020   Procedure: BYPASS GRAFT LEFT COMMON FEMORAL TO ABOVE KNEE POPLITEAL ARTERY;  Surgeon: [name], MD;  Location: [place];  Service: Vascular;  Laterality: Left;   HEMORRHOID SURGERY N/A 04/06/2021   Procedure: HEMORRHOIDECTOMY 2 COLUMN;  Surgeon: Ileana Roup, MD;  Location: [place];  Service: General;  Laterality:  N/A;   KNEE ARTHROSCOPY Right [place] N/A 04/06/2021   Procedure: ANORECTAL EXAM UNDER ANESTHESIA/ excision of anal polyp;  Surgeon: Ileana Roup, MD;  Location: [facility];  Service: General;  Laterality: N/A;   TONSILLECTOMY     at a very young age, but grew back   TOTAL HIP ARTHROPLASTY Right 05/25/2019   Procedure: RIGHT TOTAL HIP ARTHROPLASTY ANTERIOR APPROACH;  Surgeon: Leandrew Koyanagi, MD;  Location: [place];  Service: Orthopedics;  Laterality: Right;   TOTAL KNEE ARTHROPLASTY Right 09/04/2021   Procedure: RIGHT TOTAL KNEE ARTHROPLASTY;  Surgeon: Leandrew Koyanagi, MD;  Location: [place];  Service: Orthopedics;  Laterality: Right;   Patient Active Problem List   Diagnosis Date Noted   Status post total right knee replacement 09/04/2021   Leukocytosis 10/11/2020   PAD (peripheral artery disease) ([place]) 06/29/2020   Prediabetes 04/22/2020   Chronic kidney disease, stage 3a ([place]) 04/15/2020   Severe tobacco dependence 04/12/2020   Lower urinary tract symptoms (LUTS) 04/12/2020   History of colon polyps 04/12/2020   Atherosclerosis of native arteries of extremity with intermittent claudication ([place]) 03/01/2020   Pain in both feet 07/31/2019   Status post total replacement of right hip 05/25/2019   Lumbar radiculopathy 02/05/2019     REFERRING PROVIDER: Izora Ribas, MD  REFERRING DIAG: Bilateral hip pain [M25.551, M25.552], Low back pain without sciatica, unspecified back pain laterality, unspecified chronicity [M54.50]   Rationale for Evaluation and Treatment: Rehabilitation  THERAPY DIAG:  No diagnosis  found.  ONSET DATE: 3+ years ago after back surgery    SUBJECTIVE:                                                                                                                                                                                           SUBJECTIVE STATEMENT:  Pt states that the pain is a lot better than it has  been.   PERTINENT HISTORY:  Alcoholism, Arthritis, Cataracts, PVD, OA of R hip, substance abuse.   PAIN:  Are you having pain? No just stiffness/tightness  PRECAUTIONS: None  WEIGHT BEARING RESTRICTIONS: No  FALLS:  Has patient fallen in last 6 months? No  LIVING ENVIRONMENT: Lives with: lives with their spouse Lives in: House/apartment Stairs: Yes: Internal: 14 steps; on right going up and External: 4 steps; none Has following equipment at home: None  OCCUPATION: Retired   PLOF: Independent  PATIENT GOALS: Pt would like to be able to resolve his bilat hip pain and walk increased distances without familiar pain.    OBJECTIVE:   DIAGNOSTIC FINDINGS:  1. Status post posterior instrumented fusion and decompression at L4-L5 without residual spinal canal or neural foraminal stenosis. 2. Adjacent segment disease at L3-L4 with trace retrolisthesis, disc bulge, and bilateral facet arthropathy resulting in mild spinal canal and bilateral subarticular zone without evidence of frank nerve root impingement, and mild left worse than right neural foraminal stenosis. 3. Mild left worse than right subarticular zone narrowing L2-L3.  PATIENT SURVEYS:  [place] is down, report next session.   SCREENING FOR RED FLAGS: Bowel or bladder incontinence: No  COGNITION: Overall cognitive status: Within functional limits for tasks assessed   SENSATION: WFL  POSTURE: No Significant postural limitations  PALPATION: No TTP  LUMBAR ROM:   AROM eval  Flexion 85%   Extension WFL  Right lateral flexion WFL  Left lateral flexion WFL  Right rotation WFL  Left rotation WFL   (Blank rows = not tested)  LOWER EXTREMITY ROM:     Active  Right eval Left eval  Hip flexion [facility]  Hip internal rotation [facility]  Hip external rotation [facility]  Knee flexion [facility] WFL  Knee extension WFL WFL   (Blank rows = not tested)  LOWER EXTREMITY MMT:    MMT Right eval Left eval  Hip  flexion 4 4+  Knee flexion 4+ 5  Knee extension 4+ gave way due to pain from residual TKA 5   (Blank rows = not tested)  FUNCTIONAL TESTS:  3 minute walk test: 436 ft prior to excruciating pain Symptoms started at 1 min 43 sec requiring pt  to stop for a few seconds prior to resuming.   GAIT: Distance walked: 436 ft Assistive device utilized: None Level of assistance: Complete Independence Comments: Pt walks with a slow antalgic gait pattern with less stance time on his R LE.   Today's Treatment:  04/26/22  TherEx  Nustep L5x6 BLEs only  Step up 6" step fwd/ lateral x10, bilat  Bilat leg press 100/150# 3x10 Squats to chair 2x10  LAQ with 3#, 2x10 bilat. Focus on eccentric control. Lumbar rotation stretch 5x5 seconds B + TA sets  SKTC 5x5 seconds B  Figure 4 stretches 2x30 seconds B  Hip flexor stretches 2x30 seconds B  TA activation with 5 sec hold, alt UE/LE isometric x10, bilat    04/23/22  TherEx Nustep L5x6 BLEs only  Lumbar rotation stretch 5x5 seconds B + TA sets  SKTC 5x5 seconds B  Figure 4 stretches 2x30 seconds B  Hip flexor stretches 2x30 seconds B  LAQ 2x10 on R LE only. Focus on eccentric control.  Sit to stand from low mat 2x10 focus on eccentric control.  Standing 3 way hip, bilat x10 each motion. VC for less motion due to trunk flexion.   04/18/22  TherEx  Nustep L5x6 BLEs only  Lumbar rotation stretch 5x5 seconds B + TA sets  SKTC 5x5 seconds B  Figure 4 stretches 3x30 seconds B  Hip flexor stretches 2x30 seconds B  QL stretch x6- 2x30 seconds midline, 2x30 seconds L, 2x30 seconds R Standing lateral hip excursions x8 B    04/16/22  TherEx  Nustep L5x6 minutes BLEs only Walking bridges x15 Bridge + red TB x15 Piriformis stretches 2x30 seconds B Hip flexor stretches 2x30 seconds B Standing hip excursions x20 each direction STS with red TB above knees x12 no UEs      04/11/22  TherEx  Bridges + ABD into red TB x15 3 second holds   Sidelying hip ABD red TB x10 B Mod cues for form  Figure 4 stretches B 3x30 seconds supine Walking bridges x10 PPT with 3 second holds x15 seconds Hip flexor stretch 3x30 seconds R LE PPT + march x10      Eval: 04/04/2022:   Access Code: [email or web address] URL: [email or web address] Date: 04/16/2022 Prepared by: Deniece Ree  Exercises - Seated Hamstring Stretch  - 1 x daily - 7 x weekly - 2 sets - 2 reps - 30 hold - Supine Bridge  - 2 x daily - 7 x weekly - 2 sets - 10 reps - Supine Lower Trunk Rotation  - 2 x daily - 7 x weekly - 2 sets - 10 reps - Bridge Walk Out  - 1 x daily - 7 x weekly - 3 sets - 10 reps - Bridge with Abduction and Resistance Loop  - 1 x daily - 7 x weekly - 3 sets - 10 reps - Supine Figure 4 Piriformis Stretch  - 1 x daily - 7 x weekly - 3 sets - 10 reps  ASSESSMENT:  CLINICAL IMPRESSION:   Mr. Misko arrives today doing well. He was able to progress with strengthening of bilat LE's in standing today. He reports pain in feet due to secondary ongoing issues. Pt is seeking out specialized shoes to help with weight bearing and walking. Making slow and steady progress.  Will continue efforts.    OBJECTIVE IMPAIRMENTS: decreased activity tolerance, difficulty walking, decreased balance, decreased endurance, decreased mobility, decreased ROM, decreased strength, impaired flexibility, impaired UE/LE use, postural dysfunction, and pain.  ACTIVITY LIMITATIONS: bending, lifting, carry, locomotion, cleaning, community activity, driving, and or occupation  PERSONAL FACTORS: Alcoholism, Arthritis, Cataracts, PVD, OA of R hip, substance abuse.  are also affecting patient's functional outcome.  REHAB POTENTIAL: Good  CLINICAL DECISION MAKING: Evolving/moderate complexity  EVALUATION COMPLEXITY: Moderate    GOALS: Short term PT Goals Target date: 04/18/2022 Pt will be I and compliant with HEP. Baseline:  Goal status: New Pt will decrease pain by 25%  with active walking.  Baseline: Goal status: New  Long term PT goals Target date: 05/16/2022 Pt will improve  hip/knee strength to at least 5-/5 MMT to improve functional strength Baseline: Goal status: New Pt will improve FOTO to at least .... % functional to show improved function Baseline: Goal status: New Pt will reduce pain by overall 50% overall with usual activity Baseline: Goal status: New Pt will reduce pain to overall less than 3-4/10 with usual activity and work activity. Baseline: Goal status: New Pt will be able to ambulate community distances at least 1000 ft WNL gait pattern without complaints of pain.  Baseline: Goal status: New  PLAN: PT FREQUENCY: 2 times per week   PT DURATION: 6 weeks  PLANNED INTERVENTIONS (unless contraindicated): aquatic PT, Canalith repositioning, cryotherapy, Electrical stimulation, Iontophoresis with 4 mg/ml dexamethasome, Moist heat, traction, Ultrasound, gait training, Therapeutic exercise, balance training, neuromuscular re-education, patient/family education, prosthetic training, manual techniques, passive ROM, dry needling, taping, vasopnuematic device, vestibular, spinal manipulations, joint manipulations  PLAN FOR NEXT SESSION: update/ review HEP. Strengthen bilat proximal hip's, core and improve gait mechanics. Needs more appts   Rudi Heap PT, DPT 04/27/22  11:38 AM

## 2022-04-30 ENCOUNTER — Encounter: Payer: Self-pay | Admitting: Physical Therapy

## 2022-04-30 ENCOUNTER — Ambulatory Visit (INDEPENDENT_AMBULATORY_CARE_PROVIDER_SITE_OTHER): Payer: Medicare Other | Admitting: Physical Therapy

## 2022-04-30 DIAGNOSIS — M25551 Pain in right hip: Secondary | ICD-10-CM

## 2022-04-30 DIAGNOSIS — M6281 Muscle weakness (generalized): Secondary | ICD-10-CM

## 2022-04-30 DIAGNOSIS — M25552 Pain in left hip: Secondary | ICD-10-CM

## 2022-04-30 DIAGNOSIS — R262 Difficulty in walking, not elsewhere classified: Secondary | ICD-10-CM

## 2022-05-02 ENCOUNTER — Ambulatory Visit (INDEPENDENT_AMBULATORY_CARE_PROVIDER_SITE_OTHER): Payer: Medicare Other | Admitting: Physical Therapy

## 2022-05-02 ENCOUNTER — Encounter: Payer: Self-pay | Admitting: Physical Therapy

## 2022-05-02 DIAGNOSIS — M6281 Muscle weakness (generalized): Secondary | ICD-10-CM

## 2022-05-02 DIAGNOSIS — M25552 Pain in left hip: Secondary | ICD-10-CM | POA: Diagnosis not present

## 2022-05-02 DIAGNOSIS — R262 Difficulty in walking, not elsewhere classified: Secondary | ICD-10-CM | POA: Diagnosis not present

## 2022-05-02 DIAGNOSIS — M25551 Pain in right hip: Secondary | ICD-10-CM | POA: Diagnosis not present

## 2022-05-02 NOTE — Therapy (Signed)
OUTPATIENT PHYSICAL THERAPY THORACOLUMBAR TREATMENT    Patient Name: Gerald Carpenter MRN: [id number] DOB:September 24, 1950, 72 y.o., male Today's Date: 05/02/2022  END OF SESSION:  PT End of Session - 05/02/22 0833     Visit Number 8    Number of Visits 12    Date for PT Re-Evaluation 05/16/22    Authorization Type Medicare    Progress Note Due on Visit 10    PT Start Time 0826    PT Stop Time 0904    PT Time Calculation (min) 38 min    Activity Tolerance Patient tolerated treatment well    Behavior During Therapy [facility] for tasks assessed/performed                    Past Medical History:  Diagnosis Date   Alcoholism ([place])    Arthritis    Cataract    Colon polyps    Complication of anesthesia    HLD (hyperlipidemia)    Nephritis    when pt. was 7 yrs. ols   Peripheral vascular disease (HCC)    PONV (postoperative nausea and vomiting)    "Only when I have aortagram on 06/23/20".   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   Substance abuse [facility])    Past Surgical History:  Procedure Laterality Date   ABDOMINAL AORTOGRAM W/LOWER EXTREMITY N/A 06/23/2020   Procedure: ABDOMINAL AORTOGRAM W/LOWER EXTREMITY;  Surgeon: [name], MD;  Location: [place] [facility];  Service: Cardiovascular;  Laterality: N/A;   ANTERIOR LAT LUMBAR FUSION Right 02/05/2019   Procedure: ATTEMPTED ANTERIOR LATERAL INTERBODY FUSION;  Surgeon: Consuella Lose, MD;  Location: [place];  Service: Neurosurgery;  Laterality: Right;  RIGHT LATERAL TRANSPSOAS DISCECTOMY AND FUSION WITH ROBOTIC ASSISTED PEDICLE SCREW STABILIZATION, LUMBAR 4- LUMBAR 5   CATARACT EXTRACTION W/ INTRAOCULAR LENS  IMPLANT, BILATERAL Bilateral 2013   lens implants for cataracts after Lasik   COLONOSCOPY     ENDOVEIN HARVEST OF GREATER SAPHENOUS VEIN Left 06/29/2020   Procedure: HARVEST OF LEFT GREATER SAPHENOUS VEIN;  Surgeon: [name], MD;  Location: [place];  Service: Vascular;  Laterality: Left;    FEMORAL-POPLITEAL BYPASS GRAFT Left 06/29/2020   Procedure: BYPASS GRAFT LEFT COMMON FEMORAL TO ABOVE KNEE POPLITEAL ARTERY;  Surgeon: [name], MD;  Location: [place];  Service: Vascular;  Laterality: Left;   HEMORRHOID SURGERY N/A 04/06/2021   Procedure: HEMORRHOIDECTOMY 2 COLUMN;  Surgeon: Ileana Roup, MD;  Location: [place];  Service: General;  Laterality: N/A;   KNEE ARTHROSCOPY Right [place] N/A 04/06/2021   Procedure: ANORECTAL EXAM UNDER ANESTHESIA/ excision of anal polyp;  Surgeon: Ileana Roup, MD;  Location: [facility];  Service: General;  Laterality: N/A;   TONSILLECTOMY     at a very young age, but grew back   TOTAL HIP ARTHROPLASTY Right 05/25/2019   Procedure: RIGHT TOTAL HIP ARTHROPLASTY ANTERIOR APPROACH;  Surgeon: Leandrew Koyanagi, MD;  Location: [place];  Service: Orthopedics;  Laterality: Right;   TOTAL KNEE ARTHROPLASTY Right 09/04/2021   Procedure: RIGHT TOTAL KNEE ARTHROPLASTY;  Surgeon: Leandrew Koyanagi, MD;  Location: [place];  Service: Orthopedics;  Laterality: Right;   Patient Active Problem List   Diagnosis Date Noted   Status post total right knee replacement 09/04/2021   Leukocytosis 10/11/2020   PAD (peripheral artery disease) ([place]) 06/29/2020   Prediabetes 04/22/2020   Chronic kidney disease, stage 3a ([place]) 04/15/2020  Severe tobacco dependence 04/12/2020   Lower urinary tract symptoms (LUTS) 04/12/2020   History of colon polyps 04/12/2020   Atherosclerosis of native arteries of extremity with intermittent claudication ([place]) 03/01/2020   Pain in both feet 07/31/2019   Status post total replacement of right hip 05/25/2019   Lumbar radiculopathy 02/05/2019     REFERRING PROVIDER: Izora Ribas, MD  REFERRING DIAG: Bilateral hip pain [M25.551, M25.552], Low back pain without sciatica, unspecified back pain laterality, unspecified chronicity [M54.50]   Rationale for  Evaluation and Treatment: Rehabilitation  THERAPY DIAG:  Difficulty in walking, not elsewhere classified  Pain in right hip  Pain in left hip  Muscle weakness (generalized)  ONSET DATE: 3+ years ago after back surgery    SUBJECTIVE:                                                                                                                                                                                           SUBJECTIVE STATEMENT:  I'm a little sore from the workout the other day, stiff and my muscles are sore. I went to the store the other day after my workout and my legs just want to stop, they're not sore but they just don't want to move. This has been going on for years, this is what we're trying to figure out. The bottom of my feet start to hurt and then my legs don't want to move after that, then after I stop for a second I can walk again. Would give myself an 80% with the last 20% being concerns about R LE strength.   PERTINENT HISTORY:  Alcoholism, Arthritis, Cataracts, PVD, OA of R hip, substance abuse.   PAIN:  Are you having pain? No just stiffness/tightness  PRECAUTIONS: None  WEIGHT BEARING RESTRICTIONS: No  FALLS:  Has patient fallen in last 6 months? No  LIVING ENVIRONMENT: Lives with: lives with their spouse Lives in: House/apartment Stairs: Yes: Internal: 14 steps; on right going up and External: 4 steps; none Has following equipment at home: None  OCCUPATION: Retired   PLOF: Independent  PATIENT GOALS: Pt would like to be able to resolve his bilat hip pain and walk increased distances without familiar pain.    OBJECTIVE:   DIAGNOSTIC FINDINGS:  1. Status post posterior instrumented fusion and decompression at L4-L5 without residual spinal canal or neural foraminal stenosis. 2. Adjacent segment disease at L3-L4 with trace retrolisthesis, disc bulge, and bilateral facet arthropathy resulting in mild spinal canal and bilateral subarticular zone  without evidence of frank nerve root impingement, and mild left worse than right neural foraminal stenosis. 3. Mild left worse  than right subarticular zone narrowing L2-L3.  PATIENT SURVEYS:  [place] is down, report next session.   SCREENING FOR RED FLAGS: Bowel or bladder incontinence: No  COGNITION: Overall cognitive status: Within functional limits for tasks assessed   SENSATION: WFL  POSTURE: No Significant postural limitations  PALPATION: No TTP  LUMBAR ROM:   AROM eval  Flexion 85%   Extension WFL  Right lateral flexion WFL  Left lateral flexion WFL  Right rotation WFL  Left rotation WFL   (Blank rows = not tested)  LOWER EXTREMITY ROM:     Active  Right eval Left eval  Hip flexion [facility]  Hip internal rotation [facility] WFL  Hip external rotation [facility]  Knee flexion [facility] WFL  Knee extension WFL WFL   (Blank rows = not tested)  LOWER EXTREMITY MMT:    MMT Right eval Left eval  Hip flexion 4 4+  Knee flexion 4+ 5  Knee extension 4+ gave way due to pain from residual TKA 5   (Blank rows = not tested)  FUNCTIONAL TESTS:  3 minute walk test: 436 ft prior to excruciating pain Symptoms started at 1 min 43 sec requiring pt to stop for a few seconds prior to resuming.   GAIT: Distance walked: 436 ft Assistive device utilized: None Level of assistance: Complete Independence Comments: Pt walks with a slow antalgic gait pattern with less stance time on his R LE.   Today's Treatment:    05/02/22  TherEx  Precor bike x6 minutes for warmup  Shuttle leg press SL 75# x15 B, 87# x7 B Forward step ups 15# KB x15 B 6 inch box  STS with 15# KB goblet hold slow lower x15  Lateral step ups with 15# KB x15 B 6 inch box  Piriformis stretch 2x30 seconds B HS stretch 2x30 seconds B Gastroc stretch 2x30 seconds B    04/30/22  TherEx Nustep L5x6 BLEs only  Step up 6" step fwd/ lateral x10, bilat  Bilat leg press 150# 2x15 SL leg press 75# 2x15,  bilat Sit to stand from high - low table with 15# kettle bell 2x15  LAQ with 3#, 2x15 bilat. Focus on eccentric control. Seated HS stretch 2x30 sec   04/26/22 TherEx  Nustep L5x6 BLEs only  Step up 6" step fwd/ lateral x10, bilat  Bilat leg press 100/150# 3x10 Squats to chair 2x10  LAQ with 3#, 2x10 bilat. Focus on eccentric control. Lumbar rotation stretch 5x5 seconds B + TA sets  SKTC 5x5 seconds B  Figure 4 stretches 2x30 seconds B  Hip flexor stretches 2x30 seconds B  TA activation with 5 sec hold, alt UE/LE isometric x10, bilat    04/23/22  TherEx Nustep L5x6 BLEs only  Lumbar rotation stretch 5x5 seconds B + TA sets  SKTC 5x5 seconds B  Figure 4 stretches 2x30 seconds B  Hip flexor stretches 2x30 seconds B  LAQ 2x10 on R LE only. Focus on eccentric control.  Sit to stand from low mat 2x10 focus on eccentric control.  Standing 3 way hip, bilat x10 each motion. VC for less motion due to trunk flexion.   04/18/22  TherEx  Nustep L5x6 BLEs only  Lumbar rotation stretch 5x5 seconds B + TA sets  SKTC 5x5 seconds B  Figure 4 stretches 3x30 seconds B  Hip flexor stretches 2x30 seconds B  QL stretch x6- 2x30 seconds midline, 2x30 seconds L, 2x30 seconds R Standing lateral hip excursions x8 B  04/16/22  TherEx  Nustep L5x6 minutes BLEs only Walking bridges x15 Bridge + red TB x15 Piriformis stretches 2x30 seconds B Hip flexor stretches 2x30 seconds B Standing hip excursions x20 each direction STS with red TB above knees x12 no UEs      04/11/22  TherEx  Bridges + ABD into red TB x15 3 second holds  Sidelying hip ABD red TB x10 B Mod cues for form  Figure 4 stretches B 3x30 seconds supine Walking bridges x10 PPT with 3 second holds x15 seconds Hip flexor stretch 3x30 seconds R LE PPT + march x10      Eval: 04/04/2022:   Access Code: [email or web address] URL: [email or web address] Date: 04/16/2022 Prepared by: Deniece Ree  Exercises - Seated Hamstring Stretch  - 1 x daily - 7 x weekly - 2 sets - 2 reps - 30 hold - Supine Bridge  - 2 x daily - 7 x weekly - 2 sets - 10 reps - Supine Lower Trunk Rotation  - 2 x daily - 7 x weekly - 2 sets - 10 reps - Bridge Walk Out  - 1 x daily - 7 x weekly - 3 sets - 10 reps - Bridge with Abduction and Resistance Loop  - 1 x daily - 7 x weekly - 3 sets - 10 reps - Supine Figure 4 Piriformis Stretch  - 1 x daily - 7 x weekly - 3 sets - 10 reps  ASSESSMENT:  CLINICAL IMPRESSION:   Mr. Bogus arrives doing well, pain in legs is much better but he continues to have issues with pain on the bottom of his feet causing him to have trouble moving around. Continued focus on general strengthening as able and tolerated. Only has a couple more sessions left, we may plan on doing a reassessment soon to assess progress and further clarify POC.   OBJECTIVE IMPAIRMENTS: decreased activity tolerance, difficulty walking, decreased balance, decreased endurance, decreased mobility, decreased ROM, decreased strength, impaired flexibility, impaired UE/LE use, postural dysfunction, and pain.  ACTIVITY LIMITATIONS: bending, lifting, carry, locomotion, cleaning, community activity, driving, and or occupation  PERSONAL FACTORS: Alcoholism, Arthritis, Cataracts, PVD, OA of R hip, substance abuse.  are also affecting patient's functional outcome.  REHAB POTENTIAL: Good  CLINICAL DECISION MAKING: Evolving/moderate complexity  EVALUATION COMPLEXITY: Moderate    GOALS: Short term PT Goals Target date: 04/18/2022 Pt will be I and compliant with HEP. Baseline:  Goal status: New Pt will decrease pain by 25% with active walking.  Baseline: Goal status: New  Long term PT goals Target date: 05/16/2022 Pt will improve  hip/knee strength to at least 5-/5 MMT to improve functional strength Baseline: Goal status: New Pt will improve FOTO to at least .... % functional to show improved  function Baseline: Goal status: New Pt will reduce pain by overall 50% overall with usual activity Baseline: Goal status: New Pt will reduce pain to overall less than 3-4/10 with usual activity and work activity. Baseline: Goal status: New Pt will be able to ambulate community distances at least 1000 ft WNL gait pattern without complaints of pain.  Baseline: Goal status: New  PLAN: PT FREQUENCY: 2 times per week   PT DURATION: 6 weeks  PLANNED INTERVENTIONS (unless contraindicated): aquatic PT, Canalith repositioning, cryotherapy, Electrical stimulation, Iontophoresis with 4 mg/ml dexamethasome, Moist heat, traction, Ultrasound, gait training, Therapeutic exercise, balance training, neuromuscular re-education, patient/family education, prosthetic training, manual techniques, passive ROM, dry needling, taping, vasopnuematic device, vestibular, spinal manipulations, joint manipulations  PLAN FOR  NEXT SESSION: update/ review HEP. Strengthen bilat proximal hip's, core and improve gait mechanics. Re-assess in 2 visits.   Deniece Ree PT DPT PN2

## 2022-05-07 ENCOUNTER — Encounter: Payer: Medicare Other | Admitting: Physical Therapy

## 2022-05-07 ENCOUNTER — Encounter: Payer: Medicare Other | Admitting: Rehabilitative and Restorative Service Providers"

## 2022-05-08 ENCOUNTER — Encounter: Payer: Self-pay | Admitting: Physical Therapy

## 2022-05-08 ENCOUNTER — Ambulatory Visit (INDEPENDENT_AMBULATORY_CARE_PROVIDER_SITE_OTHER): Payer: Medicare Other | Admitting: Physical Therapy

## 2022-05-08 DIAGNOSIS — M25551 Pain in right hip: Secondary | ICD-10-CM | POA: Diagnosis not present

## 2022-05-08 DIAGNOSIS — R262 Difficulty in walking, not elsewhere classified: Secondary | ICD-10-CM

## 2022-05-08 DIAGNOSIS — M25552 Pain in left hip: Secondary | ICD-10-CM

## 2022-05-08 DIAGNOSIS — M6281 Muscle weakness (generalized): Secondary | ICD-10-CM

## 2022-05-08 NOTE — Therapy (Signed)
OUTPATIENT PHYSICAL THERAPY THORACOLUMBAR TREATMENT    Patient Name: Gerald Carpenter MRN: [id number] DOB:24-Feb-1950, 72 y.o., male Today's Date: 05/08/2022  END OF SESSION:  PT End of Session - 05/08/22 0930     Visit Number 9    Number of Visits 12    Date for PT Re-Evaluation 05/16/22    Authorization Type Medicare    Progress Note Due on Visit 10    PT Start Time 0930    PT Stop Time 1008    PT Time Calculation (min) 38 min    Activity Tolerance Patient tolerated treatment well    Behavior During Therapy WFL for tasks assessed/performed                    Past Medical History:  Diagnosis Date   Alcoholism ([place])    Arthritis    Cataract    Colon polyps    Complication of anesthesia    HLD (hyperlipidemia)    Nephritis    when pt. was 7 yrs. ols   Peripheral vascular disease (HCC)    PONV (postoperative nausea and vomiting)    "Only when I have aortagram on 06/23/20".   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   Substance abuse [facility])    Past Surgical History:  Procedure Laterality Date   ABDOMINAL AORTOGRAM W/LOWER EXTREMITY N/A 06/23/2020   Procedure: ABDOMINAL AORTOGRAM W/LOWER EXTREMITY;  Surgeon: [name], MD;  Location: [place] [facility];  Service: Cardiovascular;  Laterality: N/A;   ANTERIOR LAT LUMBAR FUSION Right 02/05/2019   Procedure: ATTEMPTED ANTERIOR LATERAL INTERBODY FUSION;  Surgeon: Consuella Lose, MD;  Location: [place];  Service: Neurosurgery;  Laterality: Right;  RIGHT LATERAL TRANSPSOAS DISCECTOMY AND FUSION WITH ROBOTIC ASSISTED PEDICLE SCREW STABILIZATION, LUMBAR 4- LUMBAR 5   CATARACT EXTRACTION W/ INTRAOCULAR LENS  IMPLANT, BILATERAL Bilateral 2013   lens implants for cataracts after Lasik   COLONOSCOPY     ENDOVEIN HARVEST OF GREATER SAPHENOUS VEIN Left 06/29/2020   Procedure: HARVEST OF LEFT GREATER SAPHENOUS VEIN;  Surgeon: [name], MD;  Location: [place];  Service: Vascular;  Laterality: Left;    FEMORAL-POPLITEAL BYPASS GRAFT Left 06/29/2020   Procedure: BYPASS GRAFT LEFT COMMON FEMORAL TO ABOVE KNEE POPLITEAL ARTERY;  Surgeon: [name], MD;  Location: [place];  Service: Vascular;  Laterality: Left;   HEMORRHOID SURGERY N/A 04/06/2021   Procedure: HEMORRHOIDECTOMY 2 COLUMN;  Surgeon: Ileana Roup, MD;  Location: [place];  Service: General;  Laterality: N/A;   KNEE ARTHROSCOPY Right [place] N/A 04/06/2021   Procedure: ANORECTAL EXAM UNDER ANESTHESIA/ excision of anal polyp;  Surgeon: Ileana Roup, MD;  Location: [facility];  Service: General;  Laterality: N/A;   TONSILLECTOMY     at a very young age, but grew back   TOTAL HIP ARTHROPLASTY Right 05/25/2019   Procedure: RIGHT TOTAL HIP ARTHROPLASTY ANTERIOR APPROACH;  Surgeon: Leandrew Koyanagi, MD;  Location: [place];  Service: Orthopedics;  Laterality: Right;   TOTAL KNEE ARTHROPLASTY Right 09/04/2021   Procedure: RIGHT TOTAL KNEE ARTHROPLASTY;  Surgeon: Leandrew Koyanagi, MD;  Location: [place];  Service: Orthopedics;  Laterality: Right;   Patient Active Problem List   Diagnosis Date Noted   Status post total right knee replacement 09/04/2021   Leukocytosis 10/11/2020   PAD (peripheral artery disease) ([place]) 06/29/2020   Prediabetes 04/22/2020   Chronic kidney disease, stage 3a ([place]) 04/15/2020  Severe tobacco dependence 04/12/2020   Lower urinary tract symptoms (LUTS) 04/12/2020   History of colon polyps 04/12/2020   Atherosclerosis of native arteries of extremity with intermittent claudication ([place]) 03/01/2020   Pain in both feet 07/31/2019   Status post total replacement of right hip 05/25/2019   Lumbar radiculopathy 02/05/2019     REFERRING PROVIDER: Izora Ribas, MD  REFERRING DIAG: Bilateral hip pain [M25.551, M25.552], Low back pain without sciatica, unspecified back pain laterality, unspecified chronicity [M54.50]   Rationale for  Evaluation and Treatment: Rehabilitation  THERAPY DIAG:  Difficulty in walking, not elsewhere classified  Pain in right hip  Pain in left hip  Muscle weakness (generalized)  ONSET DATE: 3+ years ago after back surgery    SUBJECTIVE:                                                                                                                                                                                           SUBJECTIVE STATEMENT: I am doing much better overall, Most of my pain is in my feet now, I am going to good feet store to try orhtothics and see if this helps any.  PERTINENT HISTORY:  Alcoholism, Arthritis, Cataracts, PVD, OA of R hip, substance abuse.   PAIN:  Are you having pain? No just stiffness/tightness  PRECAUTIONS: None  WEIGHT BEARING RESTRICTIONS: No  FALLS:  Has patient fallen in last 6 months? No  LIVING ENVIRONMENT: Lives with: lives with their spouse Lives in: House/apartment Stairs: Yes: Internal: 14 steps; on right going up and External: 4 steps; none Has following equipment at home: None  OCCUPATION: Retired   PLOF: Independent  PATIENT GOALS: Pt would like to be able to resolve his bilat hip pain and walk increased distances without familiar pain.    OBJECTIVE:   DIAGNOSTIC FINDINGS:  1. Status post posterior instrumented fusion and decompression at L4-L5 without residual spinal canal or neural foraminal stenosis. 2. Adjacent segment disease at L3-L4 with trace retrolisthesis, disc bulge, and bilateral facet arthropathy resulting in mild spinal canal and bilateral subarticular zone without evidence of frank nerve root impingement, and mild left worse than right neural foraminal stenosis. 3. Mild left worse than right subarticular zone narrowing L2-L3.  PATIENT SURVEYS:  [place] is down, report next session.   SCREENING FOR RED FLAGS: Bowel or bladder incontinence: No  COGNITION: Overall cognitive status: Within  functional limits for tasks assessed   SENSATION: WFL  POSTURE: No Significant postural limitations  PALPATION: No TTP  LUMBAR ROM:   AROM eval  Flexion 85%   Extension WFL  Right lateral flexion WFL  Left  lateral flexion WFL  Right rotation WFL  Left rotation WFL   (Blank rows = not tested)  LOWER EXTREMITY ROM:     Active  Right eval Left eval  Hip flexion [facility]  Hip internal rotation [facility]  Hip external rotation [facility]  Knee flexion [facility] WFL  Knee extension WFL WFL   (Blank rows = not tested)  LOWER EXTREMITY MMT:    MMT Right eval Left eval  Hip flexion 4 4+  Knee flexion 4+ 5  Knee extension 4+ gave way due to pain from residual TKA 5   (Blank rows = not tested)  FUNCTIONAL TESTS:  3 minute walk test: 436 ft prior to excruciating pain Symptoms started at 1 min 43 sec requiring pt to stop for a few seconds prior to resuming.   GAIT: Distance walked: 436 ft Assistive device utilized: None Level of assistance: Complete Independence Comments: Pt walks with a slow antalgic gait pattern with less stance time on his R LE.   Today's Treatment:  05/08/22  TherEx  Precor bike L3 x6 minutes for warmup  Shuttle leg press SL 75# x15 B, 87# x10 B, 87# X 5 B Forward step ups 15# KB x15 B 6 inch box  STS with 15# KB goblet hold slow lower x15  Lateral step ups with 15# KB x15 B 6 inch box  Gastroc stretch 2x30 seconds B  Short foot (intrinsic foot muscle squeeze) 5 sec X 15 bilat Tandem balance 2 X 30 sec  05/02/22  TherEx  Precor bike x6 minutes for warmup  Shuttle leg press SL 75# x15 B, 87# x7 B Forward step ups 15# KB x15 B 6 inch box  STS with 15# KB goblet hold slow lower x15  Lateral step ups with 15# KB x15 B 6 inch box  Piriformis stretch 2x30 seconds B HS stretch 2x30 seconds B Gastroc stretch 2x30 seconds B      Eval: 04/04/2022:   Access Code: [email or web address] URL: [email or web address] Date: 04/16/2022 Prepared by:  Deniece Ree  Exercises - Seated Hamstring Stretch  - 1 x daily - 7 x weekly - 2 sets - 2 reps - 30 hold - Supine Bridge  - 2 x daily - 7 x weekly - 2 sets - 10 reps - Supine Lower Trunk Rotation  - 2 x daily - 7 x weekly - 2 sets - 10 reps - Bridge Walk Out  - 1 x daily - 7 x weekly - 3 sets - 10 reps - Bridge with Abduction and Resistance Loop  - 1 x daily - 7 x weekly - 3 sets - 10 reps - Supine Figure 4 Piriformis Stretch  - 1 x daily - 7 x weekly - 3 sets - 10 reps  ASSESSMENT:  CLINICAL IMPRESSION: He had good tolerance to strength program focused on Rt leg more than left as this is his weakest leg. His biggest complaint was pain in his feet today so I did show him some intrinsic foot exercises to see if this helps and he will try out orthotics as well.   OBJECTIVE IMPAIRMENTS: decreased activity tolerance, difficulty walking, decreased balance, decreased endurance, decreased mobility, decreased ROM, decreased strength, impaired flexibility, impaired UE/LE use, postural dysfunction, and pain.  ACTIVITY LIMITATIONS: bending, lifting, carry, locomotion, cleaning, community activity, driving, and or occupation  PERSONAL FACTORS: Alcoholism, Arthritis, Cataracts, PVD, OA of R hip, substance abuse.  are also affecting patient's functional outcome.  REHAB POTENTIAL: Good  CLINICAL DECISION  MAKING: Evolving/moderate complexity  EVALUATION COMPLEXITY: Moderate    GOALS: Short term PT Goals Target date: 04/18/2022 Pt will be I and compliant with HEP. Baseline:  Goal status: New Pt will decrease pain by 25% with active walking.  Baseline: Goal status: New  Long term PT goals Target date: 05/16/2022 Pt will improve  hip/knee strength to at least 5-/5 MMT to improve functional strength Baseline: Goal status: New Pt will improve FOTO to at least .... % functional to show improved function Baseline: Goal status: New Pt will reduce pain by overall 50% overall with usual  activity Baseline: Goal status: New Pt will reduce pain to overall less than 3-4/10 with usual activity and work activity. Baseline: Goal status: New Pt will be able to ambulate community distances at least 1000 ft WNL gait pattern without complaints of pain.  Baseline: Goal status: New  PLAN: PT FREQUENCY: 2 times per week   PT DURATION: 6 weeks  PLANNED INTERVENTIONS (unless contraindicated): aquatic PT, Canalith repositioning, cryotherapy, Electrical stimulation, Iontophoresis with 4 mg/ml dexamethasome, Moist heat, traction, Ultrasound, gait training, Therapeutic exercise, balance training, neuromuscular re-education, patient/family education, prosthetic training, manual techniques, passive ROM, dry needling, taping, vasopnuematic device, vestibular, spinal manipulations, joint manipulations  PLAN FOR NEXT SESSION: proximal hip's, core and improve gait mechanics. Re-assess next visit  Elsie Ra, PT, DPT 05/08/22 11:06 AM

## 2022-05-10 ENCOUNTER — Ambulatory Visit (INDEPENDENT_AMBULATORY_CARE_PROVIDER_SITE_OTHER): Payer: Medicare Other | Admitting: Physical Therapy

## 2022-05-10 ENCOUNTER — Encounter: Payer: Medicare Other | Admitting: Rehabilitative and Restorative Service Providers"

## 2022-05-10 ENCOUNTER — Encounter: Payer: Self-pay | Admitting: Physical Therapy

## 2022-05-10 DIAGNOSIS — M6281 Muscle weakness (generalized): Secondary | ICD-10-CM

## 2022-05-10 DIAGNOSIS — R262 Difficulty in walking, not elsewhere classified: Secondary | ICD-10-CM

## 2022-05-10 DIAGNOSIS — M25552 Pain in left hip: Secondary | ICD-10-CM

## 2022-05-10 DIAGNOSIS — M25551 Pain in right hip: Secondary | ICD-10-CM

## 2022-05-10 NOTE — Therapy (Addendum)
OUTPATIENT PHYSICAL THERAPY THORACOLUMBAR TREATMENT  /DISCHARGE  Progress Note reporting period date 04/04/22 to 05/10/22  See below for objective and subjective measurements relating to patients progress with PT.     Patient Name: Gerald Carpenter MRN: [id number] DOB:1950/07/05, 72 y.o., male Today's Date: 05/10/2022  END OF SESSION:  PT End of Session - 05/10/22 0811     Visit Number 10    Number of Visits 12    Date for PT Re-Evaluation 05/16/22    Authorization Type Medicare    Progress Note Due on Visit 10    PT Start Time 0800    PT Stop Time 0838    PT Time Calculation (min) 38 min    Activity Tolerance Patient tolerated treatment well    Behavior During Therapy [facility] for tasks assessed/performed                    Past Medical History:  Diagnosis Date   Alcoholism (HCC)    Arthritis    Cataract    Colon polyps    Complication of anesthesia    HLD (hyperlipidemia)    Nephritis    when pt. was 7 yrs. ols   Peripheral vascular disease (HCC)    PONV (postoperative nausea and vomiting)    "Only when I have aortagram on 06/23/20".   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   Substance abuse [facility])    Past Surgical History:  Procedure Laterality Date   ABDOMINAL AORTOGRAM W/LOWER EXTREMITY N/A 06/23/2020   Procedure: ABDOMINAL AORTOGRAM W/LOWER EXTREMITY;  Surgeon: Cephus Shelling, MD;  [facility];  Service: Cardiovascular;  Laterality: N/A;   ANTERIOR LAT LUMBAR FUSION Right 02/05/2019   Procedure: ATTEMPTED ANTERIOR LATERAL INTERBODY FUSION;  Surgeon: Lisbeth Renshaw, MD;  Location: [facility];  Service: Neurosurgery;  Laterality: Right;  RIGHT LATERAL TRANSPSOAS DISCECTOMY AND FUSION WITH ROBOTIC ASSISTED PEDICLE SCREW STABILIZATION, LUMBAR 4- LUMBAR 5   CATARACT EXTRACTION W/ INTRAOCULAR LENS  IMPLANT, BILATERAL Bilateral 2013   lens implants for cataracts after Lasik   COLONOSCOPY     ENDOVEIN HARVEST OF GREATER SAPHENOUS VEIN  Left 06/29/2020   Procedure: HARVEST OF LEFT GREATER SAPHENOUS VEIN;  Surgeon: Cephus Shelling, MD;  [facility];  Service: Vascular;  Laterality: Left;   FEMORAL-POPLITEAL BYPASS GRAFT Left 06/29/2020   Procedure: BYPASS GRAFT LEFT COMMON FEMORAL TO ABOVE KNEE POPLITEAL ARTERY;  Surgeon: Cephus Shelling, MD;  [facility];  Service: Vascular;  Laterality: Left;   HEMORRHOID SURGERY N/A 04/06/2021   Procedure: HEMORRHOIDECTOMY 2 COLUMN;  Surgeon: Andria Meuse, MD;  Location: [facility];  Service: General;  Laterality: N/A;   KNEE ARTHROSCOPY Right 1980   RECTAL EXAM UNDER ANESTHESIA N/A 04/06/2021   Procedure: ANORECTAL EXAM UNDER ANESTHESIA/ excision of anal polyp;  Surgeon: Andria Meuse, MD;  Location: [facility];  Service: General;  Laterality: N/A;   TONSILLECTOMY     at a very young age, but grew back   TOTAL HIP ARTHROPLASTY Right 05/25/2019   Procedure: RIGHT TOTAL HIP ARTHROPLASTY ANTERIOR APPROACH;  Surgeon: Tarry Kos, MD;  [facility];  Service: Orthopedics;  Laterality: Right;   TOTAL KNEE ARTHROPLASTY Right 09/04/2021   Procedure: RIGHT TOTAL KNEE ARTHROPLASTY;  Surgeon: Tarry Kos, MD;  [facility];  Service: Orthopedics;  Laterality: Right;   Patient Active Problem List   Diagnosis Date Noted   Status post total right knee replacement 09/04/2021  Leukocytosis 10/11/2020   PAD (peripheral artery disease) (HCC) 06/29/2020   Prediabetes 04/22/2020   Chronic kidney disease, stage 3a (HCC) 04/15/2020   Severe tobacco dependence 04/12/2020   Lower urinary tract symptoms (LUTS) 04/12/2020   History of colon polyps 04/12/2020   Atherosclerosis of native arteries of extremity with intermittent claudication (HCC) 03/01/2020   Pain in both feet 07/31/2019   Status post total replacement of right hip 05/25/2019   Lumbar radiculopathy 02/05/2019     REFERRING PROVIDER: Horton Chin, MD  REFERRING  DIAG: Bilateral hip pain [M25.551, M25.552], Low back pain without sciatica, unspecified back pain laterality, unspecified chronicity [M54.50]   Rationale for Evaluation and Treatment: Rehabilitation  THERAPY DIAG:  Difficulty in walking, not elsewhere classified  Pain in right hip  Pain in left hip  Muscle weakness (generalized)  ONSET DATE: 3+ years ago after back surgery    SUBJECTIVE:                                                                                                                                                                                           SUBJECTIVE STATEMENT: He is no longer having the pain in his hips (feels 80% improvement with PT) but his feet continue to hurt and he will go to the good feet store to try orthotics.   PERTINENT HISTORY:  Alcoholism, Arthritis, Cataracts, PVD, OA of R hip, substance abuse.   PAIN:  Are you having pain? No just stiffness/tightness  PRECAUTIONS: None  WEIGHT BEARING RESTRICTIONS: No  FALLS:  Has patient fallen in last 6 months? No  LIVING ENVIRONMENT: Lives with: lives with their spouse Lives in: House/apartment Stairs: Yes: Internal: 14 steps; on right going up and External: 4 steps; none Has following equipment at home: None  OCCUPATION: Retired   PLOF: Independent  PATIENT GOALS: Pt would like to be able to resolve his bilat hip pain and walk increased distances without familiar pain.    OBJECTIVE:   DIAGNOSTIC FINDINGS:  1. Status post posterior instrumented fusion and decompression at L4-L5 without residual spinal canal or neural foraminal stenosis. 2. Adjacent segment disease at L3-L4 with trace retrolisthesis, disc bulge, and bilateral facet arthropathy resulting in mild spinal canal and bilateral subarticular zone without evidence of frank nerve root impingement, and mild left worse than right neural foraminal stenosis. 3. Mild left worse than right subarticular zone narrowing  L2-L3.  PATIENT SURVEYS:  [employer] is down, report next session.  FOTO improved to 58% and met goal on 05/10/22  SCREENING FOR RED FLAGS: Bowel or bladder incontinence: No  COGNITION: Overall cognitive status: Within functional limits for  tasks assessed   SENSATION: WFL  POSTURE: No Significant postural limitations  PALPATION: No TTP  LUMBAR ROM:   AROM eval  Flexion 85%   Extension WFL  Right lateral flexion WFL  Left lateral flexion WFL  Right rotation WFL  Left rotation WFL   (Blank rows = not tested)  LOWER EXTREMITY ROM:     Active  Right eval Left eval  Hip flexion [facility]  Hip internal rotation [facility]  Hip external rotation [facility]  Knee flexion [facility] WFL  Knee extension WFL WFL   (Blank rows = not tested)  LOWER EXTREMITY MMT:    MMT Right eval Left eval Rt/Lt 05/10/22  Hip flexion 4 4+ 5/5  Knee flexion 4+ 5 5-/5  Knee extension 4+ gave way due to pain from residual TKA 5 5/5   (Blank rows = not tested)  FUNCTIONAL TESTS:  3 minute walk test: 436 ft prior to excruciating pain Symptoms started at 1 min 43 sec requiring pt to stop for a few seconds prior to resuming.   GAIT: Distance walked: 436 ft Assistive device utilized: None Level of assistance: Complete Independence Comments: Pt walks with a slow antalgic gait pattern with less stance time on his R LE.   Today's Treatment:  05/10/22  TherEx  Precor bike L3 x6 minutes for warmup  Shuttle leg press SL 75# x15 B, 87# x10 B, 87# X 5 B Forward step ups 15# KB x15 B 6 inch box  STS with 15# KB goblet hold slow lower x15  Lateral step ups with 15# KB x15 B 6 inch box  Gastroc stretch 2x30 seconds B, soleus stretch 2X 30 seconds B Short foot (intrinsic foot muscle squeeze) 5 sec X 15 bilat Tandem balance 2 X 30 sec Heel raises eccentrics off of 4 inch step X 10  05/08/22  TherEx  Precor bike L3 x6 minutes for warmup  Shuttle leg press SL 75# x15 B, 87# x10 B, 87# X 5 B Forward  step ups 15# KB x15 B 6 inch box  STS with 15# KB goblet hold slow lower x15  Lateral step ups with 15# KB x15 B 6 inch box  Gastroc stretch 2x30 seconds B  Short foot (intrinsic foot muscle squeeze) 5 sec X 15 bilat Tandem balance 2 X 30 sec  05/02/22  TherEx  Precor bike x6 minutes for warmup  Shuttle leg press SL 75# x15 B, 87# x7 B Forward step ups 15# KB x15 B 6 inch box  STS with 15# KB goblet hold slow lower x15  Lateral step ups with 15# KB x15 B 6 inch box  Piriformis stretch 2x30 seconds B HS stretch 2x30 seconds B Gastroc stretch 2x30 seconds B      Eval: 04/04/2022:   Access Code: [email or web address] URL: [email or web address] Date: 04/16/2022 Prepared by: Nedra Hai  Exercises - Seated Hamstring Stretch  - 1 x daily - 7 x weekly - 2 sets - 2 reps - 30 hold - Supine Bridge  - 2 x daily - 7 x weekly - 2 sets - 10 reps - Supine Lower Trunk Rotation  - 2 x daily - 7 x weekly - 2 sets - 10 reps - Bridge Walk Out  - 1 x daily - 7 x weekly - 3 sets - 10 reps - Bridge with Abduction and Resistance Loop  - 1 x daily - 7 x weekly - 3 sets - 10 reps - Supine Figure 4 Piriformis Stretch  -  1 x daily - 7 x weekly - 3 sets - 10 reps  -Added eccentric heel raises  X10, short foot X 10, tandem balance 30 sec X 2 bilat  ASSESSMENT:  CLINICAL IMPRESSION: This is the last PT visit he has scheduled. He has made good progress with his leg strength and hip symptoms however he continues to be limited walking due to bilateral feet pain. He will hold off on PT for now and try orthotics. He will then follow up with MD 05/28/22 and if needed will return to PT, we will DC after 30 days of inactivity.   OBJECTIVE IMPAIRMENTS: decreased activity tolerance, difficulty walking, decreased balance, decreased endurance, decreased mobility, decreased ROM, decreased strength, impaired flexibility, impaired UE/LE use, postural dysfunction, and pain.  ACTIVITY LIMITATIONS: bending, lifting,  carry, locomotion, cleaning, community activity, driving, and or occupation  PERSONAL FACTORS: Alcoholism, Arthritis, Cataracts, PVD, OA of R hip, substance abuse.  are also affecting patient's functional outcome.  REHAB POTENTIAL: Good  CLINICAL DECISION MAKING: Evolving/moderate complexity  EVALUATION COMPLEXITY: Moderate    GOALS: Short term PT Goals Target date: 04/18/2022 Pt will be I and compliant with HEP. Baseline:  Goal status: MET 04/18/22 Pt will decrease pain by 25% with active walking.  Baseline: Goal status: MET 04/18/22  Long term PT goals Target date: 05/16/2022 Pt will improve  hip/knee strength to at least 5-/5 MMT to improve functional strength Baseline: Goal status: MET 05/10/22 Pt will improve FOTO to at least .... % functional to show improved function Baseline: Goal status: MET, the predicted % was 49 and he scored 58% today on 05/10/22 Pt will reduce pain by overall 50% overall with usual activity Baseline: Goal status: MET for hips 05/10/22 Pt will reduce pain to overall less than 3-4/10 with usual activity and work activity. Baseline: Goal status: MET for hip pain but still has feet pain 05/10/22 Pt will be able to ambulate community distances at least 1000 ft WNL gait pattern without complaints of pain.  Baseline: Goal status: MET for hip pain but still has feet pain 05/10/22  PLAN: PT FREQUENCY: 2 times per week   PT DURATION: 6 weeks  PLANNED INTERVENTIONS (unless contraindicated): aquatic PT, Canalith repositioning, cryotherapy, Electrical stimulation, Iontophoresis with 4 mg/ml dexamethasome, Moist heat, traction, Ultrasound, gait training, Therapeutic exercise, balance training, neuromuscular re-education, patient/family education, prosthetic training, manual techniques, passive ROM, dry needling, taping, vasopnuematic device, vestibular, spinal manipulations, joint manipulations  PLAN FOR NEXT SESSION: hold PT to try orthotics and independent  program  Ivery Quale, PT, DPT 05/10/22 8:53 AM    PHYSICAL THERAPY DISCHARGE SUMMARY  Visits from Start of Care: 10  Current functional level related to goals / functional outcomes: See note   Remaining deficits: See note   Education / Equipment: HEP  Patient goals were met. Patient is being discharged due to not returning since the last visit.    Chyrel Masson, PT, DPT, OCS, ATC 06/25/22  2:08 PM

## 2022-05-28 ENCOUNTER — Encounter
Payer: Medicare Other | Attending: Physical Medicine and Rehabilitation | Admitting: Physical Medicine and Rehabilitation

## 2022-05-28 ENCOUNTER — Encounter: Payer: Self-pay | Admitting: Physical Medicine and Rehabilitation

## 2022-05-28 VITALS — BP 118/65 | HR 94 | Ht 67.0 in | Wt 164.6 lb

## 2022-05-28 DIAGNOSIS — M25552 Pain in left hip: Secondary | ICD-10-CM | POA: Diagnosis not present

## 2022-05-28 DIAGNOSIS — G8929 Other chronic pain: Secondary | ICD-10-CM | POA: Insufficient documentation

## 2022-05-28 DIAGNOSIS — M5441 Lumbago with sciatica, right side: Secondary | ICD-10-CM | POA: Diagnosis not present

## 2022-05-28 DIAGNOSIS — M25551 Pain in right hip: Secondary | ICD-10-CM | POA: Diagnosis not present

## 2022-05-28 DIAGNOSIS — L909 Atrophic disorder of skin, unspecified: Secondary | ICD-10-CM | POA: Diagnosis not present

## 2022-05-28 NOTE — Progress Notes (Signed)
Subjective:    Patient ID: Dillon BjorkBradley Donofrio, male    DOB: 24-Aug-1950, 72 y.o.   MRN: [id number]  HPI Mr. Joanne GavelSutton is a 72 year old man who presents for follow-up of PAD with legs locking up.  -He is s/p bypass- he does not note improvements in the leg after the bypass -He has decreased sensation in his left lower leg and pain. -pain is 4/10 on average and right now. Feels intermittent, dull, aching. -He does not take any medications for pain -He is a recovering alcoholic.  -he is an active smoker -his back has been killing him, also in is hip and buttock.  -2 years of benefit form his hip and back surgeries -pain is 4-5/10 -the therapy helped his walking and flexibility.  -hamstring is very tight and his wife has been helping to stretch this out.  -he does not want any pain medication, he would rather be in pain.  -his wife is returning to the [place] on 12/29. They plan to joint the gym together.   1) Knee replacement -did PT following surgery  2) Bilateral fat pad atrophy -this is bothering him the most right now -he feels this pain was causing him to walk funny and impacted his hips  3) bilateral hip pin -improved greatly with physical therapy -he was referred to another physician -had an MRI done on his back and pathology was noted at levels 3-4.  -has has a bulge at level 3-4.  -Dr. Vernie AmmonsNundkhumar recommended that he follow here -rest helps.  -sometimes can only walk short distances, other times regularly.      Pain Inventory Average Pain 2 Pain Right Now 3 My pain is intermittent, sharp, burning, tingling, and aching  In the last 24 hours, has pain interfered with the following? General activity 3 Relation with others 0 Enjoyment of life 0 What TIME of day is your pain at its worst? daytime Sleep (in general) Good  Pain is worse with: walking and standing Pain improves with: rest Relief from Meds: 0     Family History  Problem Relation Age of Onset    Pulmonary fibrosis Mother    Breast cancer Mother    Hyperlipidemia Mother    Hypertension Mother    Other Mother        Scarlet Fever   Heart disease Father    Hyperlipidemia Father    Diabetes Brother    Hypertension Brother    Breast cancer Maternal Aunt    Prostate cancer Paternal Grandfather    Hypothyroidism Daughter    Colon cancer Neg Hx    Esophageal cancer Neg Hx    Ulcerative colitis Neg Hx    Uterine cancer Neg Hx    Social History   Socioeconomic History   Marital status: Married    Spouse name: Clarene CritchleyYayani   Number of children: 3   Years of education: Not on file   Highest education level: Not on file  Occupational History   Occupation: retired  Tobacco Use   Smoking status: Every Day    Packs/day: 0.25    Years: 53.00    Additional pack years: 0.00    Total pack years: 13.25    Types: Cigarettes   Smokeless tobacco: Never   Tobacco comments:    had first cigarettes age  89, habit started at age 84/today he 6710 cig a day  Vaping Use   Vaping Use: Never used  Substance and Sexual Activity   Alcohol use: Not Currently  Comment: 05/10/1991   Drug use: Not Currently    Comment: prior usage of "speed"  when drinking   Sexual activity: Yes  Other Topics Concern   Not on file  Social History Narrative   Recently moved here from [facility] [place]   Has total of 4 children 1 adopted   Right handed   Caffeine 3 cup a day    Live in 2 story home   Social Determinants of Health   Financial Resource Strain: Low Risk  (12/20/2021)   Overall Financial Resource Strain (CARDIA)    Difficulty of Paying Living Expenses: Not hard at all  Food Insecurity: No Food Insecurity (12/20/2021)   Hunger Vital Sign    Worried About Running Out of Food in the Last Year: Never true    Ran Out of Food in the Last Year: Never true  Transportation Needs: No Transportation Needs (12/20/2021)   PRAPARE - Administrator, Civil Service (Medical): No    Lack of  Transportation (Non-Medical): No  Physical Activity: Insufficiently Active (12/20/2021)   Exercise Vital Sign    Days of Exercise per Week: 3 days    Minutes of Exercise per Session: 30 min  Stress: No Stress Concern Present (12/20/2021)   [employer] of Occupational Health - Occupational Stress Questionnaire    Feeling of Stress : Not at all  Social Connections: Moderately Integrated (12/20/2021)   Social Connection and Isolation Panel [NHANES]    Frequency of Communication with Friends and Family: More than three times a week    Frequency of Social Gatherings with Friends and Family: More than three times a week    Attends Religious Services: 1 to 4 times per year    Active Member of [employer] or Organizations: No    Attends Banker Meetings: Never    Marital Status: Married   Past Surgical History:  Procedure Laterality Date   ABDOMINAL AORTOGRAM W/LOWER EXTREMITY N/A 06/23/2020   Procedure: ABDOMINAL AORTOGRAM W/LOWER EXTREMITY;  Surgeon: Cephus Shelling, MD;  Location: [facility];  Service: Cardiovascular;  Laterality: N/A;   ANTERIOR LAT LUMBAR FUSION Right 02/05/2019   Procedure: ATTEMPTED ANTERIOR LATERAL INTERBODY FUSION;  Surgeon: Lisbeth Renshaw, MD;  Location: [facility];  Service: Neurosurgery;  Laterality: Right;  RIGHT LATERAL TRANSPSOAS DISCECTOMY AND FUSION WITH ROBOTIC ASSISTED PEDICLE SCREW STABILIZATION, LUMBAR 4- LUMBAR 5   CATARACT EXTRACTION W/ INTRAOCULAR LENS  IMPLANT, BILATERAL Bilateral 2013   lens implants for cataracts after Lasik   COLONOSCOPY     ENDOVEIN HARVEST OF GREATER SAPHENOUS VEIN Left 06/29/2020   Procedure: HARVEST OF LEFT GREATER SAPHENOUS VEIN;  Surgeon: Cephus Shelling, MD;  Location: [facility];  Service: Vascular;  Laterality: Left;   FEMORAL-POPLITEAL BYPASS GRAFT Left 06/29/2020   Procedure: BYPASS GRAFT LEFT COMMON FEMORAL TO ABOVE KNEE POPLITEAL ARTERY;  Surgeon: Cephus Shelling, MD;  Location: [facility];  Service:  Vascular;  Laterality: Left;   HEMORRHOID SURGERY N/A 04/06/2021   Procedure: HEMORRHOIDECTOMY 2 COLUMN;  Surgeon: Andria Meuse, MD;  Location: [facility];  Service: General;  Laterality: N/A;   KNEE ARTHROSCOPY Right 1980   RECTAL EXAM UNDER ANESTHESIA N/A 04/06/2021   Procedure: ANORECTAL EXAM UNDER ANESTHESIA/ excision of anal polyp;  Surgeon: Andria Meuse, MD;  Location: [facility];  Service: General;  Laterality: N/A;   TONSILLECTOMY     at a very young age, but grew back   TOTAL HIP ARTHROPLASTY  Right 05/25/2019   Procedure: RIGHT TOTAL HIP ARTHROPLASTY ANTERIOR APPROACH;  Surgeon: Tarry Kos, MD;  Location: [facility];  Service: Orthopedics;  Laterality: Right;   TOTAL KNEE ARTHROPLASTY Right 09/04/2021   Procedure: RIGHT TOTAL KNEE ARTHROPLASTY;  Surgeon: Tarry Kos, MD;  Location: [facility];  Service: Orthopedics;  Laterality: Right;   Past Medical History:  Diagnosis Date   Alcoholism (HCC)    Arthritis    Cataract    Colon polyps    Complication of anesthesia    HLD (hyperlipidemia)    Nephritis    when pt. was 7 yrs. ols   Peripheral vascular disease (HCC)    PONV (postoperative nausea and vomiting)    "Only when I have aortagram on 06/23/20".   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   Substance abuse (HCC)    BP 118/65   Pulse 94   Ht 5\' 7"  (1.702 m)   Wt 164 lb 9.6 oz (74.7 kg)   SpO2 93%   BMI 25.78 kg/m   Opioid Risk Score:   Fall Risk Score:  `1  Depression screen [facility] 2/9     05/28/2022   11:21 AM 03/23/2022   10:10 AM 12/20/2021    9:18 AM 10/25/2021    7:57 AM 04/13/2021    9:19 AM 02/03/2021   10:09 AM 11/17/2020    9:47 AM  Depression screen PHQ 2/9  Decreased Interest 0 0 0 0 0 0 3  Down, Depressed, Hopeless 0 0 0 0 0 0 3  PHQ - 2 Score 0 0 0 0 0 0 6  Altered sleeping       1  Tired, decreased energy       3  Change in appetite       0  Feeling bad or failure about yourself        2  Trouble  concentrating       0  Moving slowly or fidgety/restless       0  Suicidal thoughts       2  PHQ-9 Score       14  Difficult doing work/chores       Extremely dIfficult     Review of Systems  Constitutional: Negative.   HENT: Negative.    Eyes: Negative.   Respiratory: Negative.    Cardiovascular: Negative.   Gastrointestinal: Negative.   Endocrine: Negative.   Genitourinary: Negative.   Musculoskeletal:        Leg numbness or heaviness  Skin: Negative.   Allergic/Immunologic: Negative.   Neurological:  Positive for weakness and numbness.       Tingling  Hematological: Negative.   Psychiatric/Behavioral:  Positive for dysphoric mood and suicidal ideas.   All other systems reviewed and are negative.     Objective:   Physical Exam  Gen: no distress, normal appearing, BP 118/65, weight 164 lbs.  HEENT: oral mucosa pink and moist, NCAT Cardio: Reg rate Chest: normal effort, normal rate of breathing Abd: soft, non-distended Ext: no edema Psych: pleasant, normal affect Skin: intact Neuro:Decreased sensation in left lower extremity.  MSK: Knee popping. Normal gait, good lumbar extension and flexion. B/l fat pad atrophy to feets     Assessment & Plan:   Mr. Vonbergen is a 72 year old man who presents to establish care for lack of sensation in left leg following bypass for PAD.  1) Lack of sensation in left lower leg following bypass -Discussed Qutenza as an option for  neuropathic pain control. Discussed that this is a capsaicin patch, stronger than capsaicin cream. Discussed that it is currently approved for diabetic peripheral neuropathy and post-herpetic neuralgia, but that it has also shown benefit in treating other forms of neuropathy. Provided patient with link to site to learn more about the patch: https://www.clark.biz/. Discussed that the patch would be placed in office and benefits usually last 3 months. Discussed that unintended exposure to capsaicin can cause severe  irritation of eyes, mucous membranes, respiratory tract, and skin, but that Qutenza is a local treatment and does not have the systemic side effects of other nerve medications. Discussed that there may be pain, itching, erythema, and decreased sensory function associated with the application of Qutenza. Side effects usually subside within 1 week. A cold pack of analgesic medications can help with these side effects. Blood pressure can also be increased due to pain associated with administration of the patch.   2) Active smoker: -discussed benefits of reducing smoking.  -discussed cold [place] -discussed use of the lozenges and the patch.  -Naltrexone made him sick -his New year's resolution is to stop smoking -he quit for a year using the patch.   3) Provided with list of supplements that can help with dyslipidemia: 1) Vitamin B3 500-4,000mg  in divided doses daily (would recommend starting low as can cause uncomfortable facial flushing if started at too high a dose) 2) Phytosterols 2.15 grams daily 3) Fermented soy 30-50 grams daily 4) EGCG (found in green tea): 500-1000mg  daily 5) Omega-3 fatty acids 3000-5,000mg  daily 6) Flax seed 40 grams daily 7) Monounsaturated fats 20-40 grams daily (olives, olive oil, nuts), also reduces cardiovascular disease 8) Sesame: 40 grams daily 9) Gamma/delta tocotrienols- a family of unsaturated forms of Vitamin E- 200mg  with dinner 10) Pantethine 900mg  daily in divided doses 11) Resveratrol 250mg  daily 12) N Acetyl Cysteine 2000mg  daily in divided doses 13) Curcumin 2000-5000mg  in divided doses daily 14) Pomegranate juice: 8 ounces daily, also helps to lower blood pressure 15) Pomegranate seeds one cup daily, also helps to lower blood pressure 16) Citrus Bergamot 1000mg  daily, also helps with glucose control and weight loss 17) Vitamin C 500mg  daily 18) Quercetin [id number]  daily 19) Glutathione 20) Probiotics 60-100 billion organisms per day 21)  Fiber 22) Oats 23) Aged garlic (can eat as food or supplement of 600-900mg  per day) 24) Chia seeds 25 grams per day 25) Lycopene- carotenoid found in high concentrations in tomatoes. 26) Alpha linolenic acid 27) Flavonoids and anthocyanins 28) Wogonin- flavanoid that enhances reverse cholesterol transport 29) Coenzyme Q10 30) Pantethine- derivative of Vitamin B5: 300mg  three times per day or 450mg  twice per day with or without food 31) Barley and other whole grains 32) Orange juice 33) L- carnitine 34) L- Lysine 35) L- Arginine 36) Almonds 37) Morin 38) Rutin 39) Carnosine 40) Histidine  41) Kaempferol  42) Organosulfur compounds 43) Vitamin E 44) Oleic acid 45) RBO (ferulic acid gammaoryzanol) 46) grape seed extract 47) Red wine 48) Berberine HCL 500mg  daily or twice per day- more effective and with fewer adverse effects that ezetimibe monotherapy 49) red yeast rice 2400- 4800 mg/day 50) chlorella 51) Licorice   4) Right knee pain and popping: -had good benefit from a gel injection 1.5 years ago.  -encouraged repeat viscosupplementation if pain in knee worsens -Provided with a pain relief journal and discussed that it contains foods and lifestyle tips to naturally help to improve pain. Discussed that these lifestyle strategies are also very good for health  unlike some medications which can have negative side effects. Discussed that the act of keeping a journal can be therapeutic and helpful to realize patterns what helps to trigger and alleviate pain.    5) Low back pain -discussed lidocaine patch but he defers.  MRI of spine reviewed with him D/c gabapentin  6) Fat pad atrophy: -continue gabapentin -discussed orthotics -Discussed Qutenza as an option for neuropathic pain control. Discussed that this is a capsaicin patch, stronger than capsaicin cream. Discussed that it is currently approved for diabetic peripheral neuropathy and post-herpetic neuralgia, but that it has  also shown benefit in treating other forms of neuropathy. Provided patient with link to site to learn more about the patch: https://www.clark.biz/. Discussed that the patch would be placed in office and benefits usually last 3 months. Discussed that unintended exposure to capsaicin can cause severe irritation of eyes, mucous membranes, respiratory tract, and skin, but that Qutenza is a local treatment and does not have the systemic side effects of other nerve medications. Discussed that there may be pain, itching, erythema, and decreased sensory function associated with the application of Qutenza. Side effects usually subside within 1 week. A cold pack of analgesic medications can help with these side effects. Blood pressure can also be increased due to pain associated with administration of the patch.  -Discussed current symptoms of pain and history of pain.  -Discussed benefits of exercise in reducing pain. -Discussed following foods that may reduce pain: 1) Ginger (especially studied for arthritis)- reduce leukotriene production to decrease inflammation 2) Blueberries- high in phytonutrients that decrease inflammation 3) Salmon- marine omega-3s reduce joint swelling and pain 4) Pumpkin seeds- reduce inflammation 5) dark chocolate- reduces inflammation 6) turmeric- reduces inflammation 7) tart cherries - reduce pain and stiffness 8) extra virgin olive oil - its compound olecanthal helps to block prostaglandins  9) chili peppers- can be eaten or applied topically via capsaicin 10) mint- helpful for headache, muscle aches, joint pain, and itching 11) garlic- reduces inflammation  Link to further information on diet for chronic pain: [email or web address]   7) Bilateral hip pain:  -discussed current pain, prior hip surgery.  -discussed great improvement with PT -discussed that could be secondary to lumbar facet  arthropathy/retrolisthesis.  -will refer to PT. If not effective, will consider medial branch blocks

## 2022-05-28 NOTE — Patient Instructions (Signed)
Foods that can help with pain: 1) Ginger (especially studied for arthritis)- reduce leukotriene production to decrease inflammation 2) Blueberries- high in phytonutrients that decrease inflammation 3) Salmon- marine omega-3s reduce joint swelling and pain 4) Pumpkin seeds- reduce inflammation 5) dark chocolate- reduces inflammation 6) turmeric- reduces inflammation 7) tart cherries - reduce pain and stiffness 8) extra virgin olive oil - its compound olecanthal helps to block prostaglandins  9) chili peppers- can be eaten or applied topically via capsaicin 10) mint- helpful for headache, muscle aches, joint pain, and itching 11) garlic- reduces inflammation  Link to further information on diet for chronic pain: [email or web address]

## 2022-05-29 ENCOUNTER — Other Ambulatory Visit: Payer: Self-pay | Admitting: *Deleted

## 2022-05-29 DIAGNOSIS — I739 Peripheral vascular disease, unspecified: Secondary | ICD-10-CM

## 2022-06-05 ENCOUNTER — Ambulatory Visit (INDEPENDENT_AMBULATORY_CARE_PROVIDER_SITE_OTHER): Payer: Medicare Other | Admitting: Physician Assistant

## 2022-06-05 ENCOUNTER — Ambulatory Visit (HOSPITAL_COMMUNITY)
Admission: RE | Admit: 2022-06-05 | Discharge: 2022-06-05 | Disposition: A | Discharge status: Home / Self Care | Payer: Medicare Other | Source: Ambulatory Visit | Attending: Vascular Surgery | Admitting: Vascular Surgery

## 2022-06-05 ENCOUNTER — Ambulatory Visit (INDEPENDENT_AMBULATORY_CARE_PROVIDER_SITE_OTHER)
Admission: RE | Admit: 2022-06-05 | Discharge: 2022-06-05 | Disposition: A | Discharge status: Home / Self Care | Payer: Medicare Other | Source: Ambulatory Visit | Attending: Vascular Surgery | Admitting: Vascular Surgery

## 2022-06-05 VITALS — BP 124/77 | HR 78 | Temp 98.2°F | Resp 18 | Ht 68.0 in | Wt 162.4 lb

## 2022-06-05 DIAGNOSIS — I739 Peripheral vascular disease, unspecified: Secondary | ICD-10-CM | POA: Insufficient documentation

## 2022-06-05 DIAGNOSIS — I70213 Atherosclerosis of native arteries of extremities with intermittent claudication, bilateral legs: Secondary | ICD-10-CM

## 2022-06-05 LAB — VAS US ABI WITH/WO TBI
Left ABI: 0.95
Right ABI: 1.02

## 2022-06-05 NOTE — Progress Notes (Signed)
[facility] [place] HISTORY AND PHYSICAL   History of Present Illness:  Patient is a 72 y.o. year old male who presents for evaluation of right LE PAD with history of claudication.  He has hx of left CFA to AK popliteal bypass grafting with ipsilateral nonreversed GSV on 06/29/2020 by Dr. Chestine Spore for short distance lifestyle limiting claudication.     He also has a history of L4-5 lumbar fusion.  He states he has difficulty walking due to foot discomfort and had ne shoes with inserts made.  He thinks these are helping.  He is able to walk further and he can mow the whole yard without stopping.  He denies short distance claudication and rest pain or non healing wounds.   Medical management includes Statin and ASA daily.      Past Medical History:  Diagnosis Date   Alcoholism    Arthritis    Cataract    Colon polyps    Complication of anesthesia    HLD (hyperlipidemia)    Nephritis    when pt. was 7 yrs. ols   Peripheral vascular disease    PONV (postoperative nausea and vomiting)    "Only when I have aortagram on 06/23/20".   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   Substance abuse     Past Surgical History:  Procedure Laterality Date   ABDOMINAL AORTOGRAM W/LOWER EXTREMITY N/A 06/23/2020   Procedure: ABDOMINAL AORTOGRAM W/LOWER EXTREMITY;  Surgeon: Cephus Shelling, MD;  Location: [facility];  Service: Cardiovascular;  Laterality: N/A;   ANTERIOR LAT LUMBAR FUSION Right 02/05/2019   Procedure: ATTEMPTED ANTERIOR LATERAL INTERBODY FUSION;  Surgeon: Lisbeth Renshaw, MD;  Location: [facility];  Service: Neurosurgery;  Laterality: Right;  RIGHT LATERAL TRANSPSOAS DISCECTOMY AND FUSION WITH ROBOTIC ASSISTED PEDICLE SCREW STABILIZATION, LUMBAR 4- LUMBAR 5   CATARACT EXTRACTION W/ INTRAOCULAR LENS  IMPLANT, BILATERAL Bilateral 2013   lens implants for cataracts after Lasik   COLONOSCOPY     ENDOVEIN HARVEST OF GREATER SAPHENOUS VEIN Left 06/29/2020    Procedure: HARVEST OF LEFT GREATER SAPHENOUS VEIN;  Surgeon: Cephus Shelling, MD;  Location: [facility];  Service: Vascular;  Laterality: Left;   FEMORAL-POPLITEAL BYPASS GRAFT Left 06/29/2020   Procedure: BYPASS GRAFT LEFT COMMON FEMORAL TO ABOVE KNEE POPLITEAL ARTERY;  Surgeon: Cephus Shelling, MD;  Location: [facility];  Service: Vascular;  Laterality: Left;   HEMORRHOID SURGERY N/A 04/06/2021   Procedure: HEMORRHOIDECTOMY 2 COLUMN;  Surgeon: Andria Meuse, MD;  Location: [facility];  Service: General;  Laterality: N/A;   KNEE ARTHROSCOPY Right 1980   RECTAL EXAM UNDER ANESTHESIA N/A 04/06/2021   Procedure: ANORECTAL EXAM UNDER ANESTHESIA/ excision of anal polyp;  Surgeon: Andria Meuse, MD;  Location: [facility];  Service: General;  Laterality: N/A;   TONSILLECTOMY     at a very young age, but grew back   TOTAL HIP ARTHROPLASTY Right 05/25/2019   Procedure: RIGHT TOTAL HIP ARTHROPLASTY ANTERIOR APPROACH;  Surgeon: Tarry Kos, MD;  Location: [facility];  Service: Orthopedics;  Laterality: Right;   TOTAL KNEE ARTHROPLASTY Right 09/04/2021   Procedure: RIGHT TOTAL KNEE ARTHROPLASTY;  Surgeon: Tarry Kos, MD;  Location: [facility];  Service: Orthopedics;  Laterality: Right;    ROS:   General:  No weight loss, Fever, chills  HEENT: No recent headaches, no nasal bleeding, no visual changes, no sore throat  Neurologic: No dizziness, blackouts, seizures. No recent symptoms of  stroke or mini- stroke. No recent episodes of slurred speech, or temporary blindness.  Cardiac: No recent episodes of chest pain/pressure, no shortness of breath at rest.  No shortness of breath with exertion.  Denies history of atrial fibrillation or irregular heartbeat  Vascular: No history of rest pain in feet.  No history of claudication.  No history of non-healing ulcer, No history of DVT   Pulmonary: No home oxygen, no productive cough, no hemoptysis,  No asthma or  wheezing  Musculoskeletal:  [ ]  Arthritis, [ ]  Low back pain,  [ ]  Joint pain  Hematologic:No history of hypercoagulable state.  No history of easy bleeding.  No history of anemia  Gastrointestinal: No hematochezia or melena,  No gastroesophageal reflux, no trouble swallowing  Urinary: [ ]  chronic Kidney disease, [ ]  on HD - [ ]  MWF or [ ]  TTHS, [ ]  Burning with urination, [ ]  Frequent urination, [ ]  Difficulty urinating;   Skin: No rashes  Psychological: No history of anxiety,  No history of depression  Social History Social History   Tobacco Use   Smoking status: Every Day    Packs/day: 0.25    Years: 53.00    Additional pack years: 0.00    Total pack years: 13.25    Types: Cigarettes   Smokeless tobacco: Never   Tobacco comments:    had first cigarettes age  68, habit started at age 32/today he 83 cig a day  Vaping Use   Vaping Use: Never used  Substance Use Topics   Alcohol use: Not Currently    Comment: 05/10/1991   Drug use: Not Currently    Comment: prior usage of "speed"  when drinking    Family History Family History  Problem Relation Age of Onset   Pulmonary fibrosis Mother    Breast cancer Mother    Hyperlipidemia Mother    Hypertension Mother    Other Mother        Scarlet Fever   Heart disease Father    Hyperlipidemia Father    Diabetes Brother    Hypertension Brother    Breast cancer Maternal Aunt    Prostate cancer Paternal Grandfather    Hypothyroidism Daughter    Colon cancer Neg Hx    Esophageal cancer Neg Hx    Ulcerative colitis Neg Hx    Uterine cancer Neg Hx     Allergies  Allergies  Allergen Reactions   Anesthetics, Amide Nausea And Vomiting     Current Outpatient Medications  Medication Sig Dispense Refill   aspirin EC 81 MG tablet Take 1 tablet (81 mg total) by mouth 2 (two) times daily. To be taken after surgery to prevent blood clots.  Swallow whole. 84 tablet 0   atorvastatin (LIPITOR) 40 MG tablet Take 1 tablet (40 mg  total) by mouth daily. 90 tablet 3   gabapentin (NEURONTIN) 300 MG capsule TAKE 1 CAPSULE BY MOUTH EVERY NIGHT AT BEDTIME FOR 30 DAYS, THEN 1 CAPSULE TWICE DAILY X30 DAYS, THEN 1 CAPSULE THREE TIMES DAILY 180 capsule 0   methocarbamol (ROBAXIN-750) 750 MG tablet Take 1 tablet (750 mg total) by mouth 2 (two) times daily as needed for muscle spasms. 20 tablet 2   ondansetron (ZOFRAN) 4 MG tablet Take 1 tablet (4 mg total) by mouth every 8 (eight) hours as needed for nausea or vomiting. 40 tablet 0   tamsulosin (FLOMAX) 0.4 MG CAPS capsule TAKE 1 CAPSULE(0.4 MG) BY MOUTH IN THE MORNING 90 capsule 3  Wheat Dextrin-Vit B6-B12-FA (BENEFIBER PLUS HEART HEALTH PO) Take 2 Scoops by mouth daily.     No current facility-administered medications for this visit.    Physical Examination  Vitals:   06/05/22 1315  BP: 124/77  Pulse: 78  Resp: 18  Temp: 98.2 F (36.8 C)  TempSrc: Temporal  SpO2: 96%  Weight: 162 lb 6.4 oz (73.7 kg)  Height:  (1.727 m)    Body mass index is 24.69 kg/m.  General:  Alert and oriented, no acute distress HEENT: Normal Neck: No bruit or JVD Pulmonary: Clear to auscultation bilaterally Cardiac: Regular Rate and Rhythm without murmur Abdomen: Soft, non-tender, non-distended, no mass, no scars Skin: No rash Extremity : no ischemic changes B LE Musculoskeletal: No deformity or edema  Neurologic: Upper and lower extremity motor grossly in tact and symmetric  DATA:  Left Graft #1: CFA- popliteal  +--------------------+--------+--------+--------+--------+                     PSV cm/sStenosisWaveformComments  +--------------------+--------+--------+--------+--------+  Inflow             169             biphasic          +--------------------+--------+--------+--------+--------+  Proximal Anastomosis159             biphasic          +--------------------+--------+--------+--------+--------+  Proximal Graft      95              biphasic           +--------------------+--------+--------+--------+--------+  Mid Graft           94              biphasic          +--------------------+--------+--------+--------+--------+  Distal Graft        90              biphasic          +--------------------+--------+--------+--------+--------+  Distal Anastomosis  57              biphasic          +--------------------+--------+--------+--------+--------+  Outflow            89              biphasic          +--------------------+--------+--------+--------+--------+     Summary:  Left: Patent left common femoral artery to above- knee popliteal artery  bypass graft.     ABI Findings:  +---------+------------------+-----+---------+--------+  Right   Rt Pressure (mmHg)IndexWaveform Comment   +---------+------------------+-----+---------+--------+  Brachial 124                                       +---------+------------------+-----+---------+--------+  PTA     131               1.02 biphasic           +---------+------------------+-----+---------+--------+  DP      113               0.88 triphasic          +---------+------------------+-----+---------+--------+  Great Toe89                0.70 Normal             +---------+------------------+-----+---------+--------+   +---------+------------------+-----+---------+-------+  Left    Lt Pressure (mmHg)IndexWaveform Comment  +---------+------------------+-----+---------+-------+  Brachial 128                                      +---------+------------------+-----+---------+-------+  PTA     122               0.95 biphasic          +---------+------------------+-----+---------+-------+  DP      118               0.92 triphasic         +---------+------------------+-----+---------+-------+  Great Toe76                0.59 Abnormal          +---------+------------------+-----+---------+-------+    +-------+-----------+-----------+------------+------------+  ABI/TBIToday's ABIToday's TBIPrevious ABIPrevious TBI  +-------+-----------+-----------+------------+------------+  Right 1.02       0.70       0.83        0.67          +-------+-----------+-----------+------------+------------+  Left  0.95       0.59       1.04        0.73          +-------+-----------+-----------+------------+------------+      Right ABIs appear increased compared to prior study on 05/02/21. Left ABIs  appear essentially unchanged compared to prior study on 05/02/21.    Summary:  Right: Resting right ankle-brachial index is within normal range. The  right toe-brachial index is normal.   Left: Resting left ankle-brachial index is within normal range. The left  toe-brachial index is abnormal.   ASSESSMENT/PLAN:   He has hx of left CFA to AK popliteal bypass grafting with ipsilateral nonreversed GSV on 06/29/2020 by Dr. Chestine Spore for short distance lifestyle limiting claudication. He has started increasing his daily activity.  He can walk further that before surgery.  He has new shoes with inserts that he thinks is helping with his peripheral neuropathy pain.     The ABI's are stable without change grossly.  The bypass is patent without stenosis and has biphasic inflow.  He will cont. Daily Statin and ASA, walking for exercise, and checking his feet for wounds.  He will f/u in 1 year for repeat surveillance.  If he develops ischemia he will call our office.   So far he is pleased about his progress.        Mosetta Pigeon PA-C [facility] [place] Office: [phone]  MD in clinic [place]

## 2022-07-04 ENCOUNTER — Other Ambulatory Visit: Payer: Self-pay | Admitting: Podiatry

## 2022-07-23 ENCOUNTER — Encounter: Payer: Self-pay | Admitting: Physical Medicine and Rehabilitation

## 2022-07-24 ENCOUNTER — Encounter
Payer: Medicare Other | Attending: Physical Medicine and Rehabilitation | Admitting: Physical Medicine and Rehabilitation

## 2022-07-24 VITALS — BP 119/73 | HR 73 | Ht 68.0 in | Wt 164.0 lb

## 2022-07-24 DIAGNOSIS — M25552 Pain in left hip: Secondary | ICD-10-CM | POA: Insufficient documentation

## 2022-07-24 DIAGNOSIS — M79671 Pain in right foot: Secondary | ICD-10-CM | POA: Diagnosis present

## 2022-07-24 DIAGNOSIS — M79672 Pain in left foot: Secondary | ICD-10-CM | POA: Insufficient documentation

## 2022-07-24 DIAGNOSIS — M25551 Pain in right hip: Secondary | ICD-10-CM | POA: Diagnosis present

## 2022-07-24 MED ORDER — NICOTINE 21 MG/24HR TD PT24: 21.0000 mg | MEDICATED_PATCH | Freq: Every day | TRANSDERMAL | 3 refills | Status: DC

## 2022-07-24 NOTE — Addendum Note (Signed)
Addended by: Horton Chin on: 07/24/2022 10:42 AM   Modules accepted: Orders

## 2022-07-24 NOTE — Addendum Note (Signed)
Addended by: Horton Chin on: 07/24/2022 10:45 AM   Modules accepted: Level of Service

## 2022-07-24 NOTE — Patient Instructions (Signed)
Red light therapy, [place] 

## 2022-07-24 NOTE — Progress Notes (Addendum)
Subjective:    Patient ID: Gerald Carpenter, male    DOB: [birth year], 72 y.o.   MRN: [id number]  HPI [name] is a 72 year old man who presents for follow-up of PAD with legs locking up.  1) Feet pain: -inserts have made the pain worse -he parked in the upper structure and he walked here without issue -any further than that and he feels pain -wears supportive shoes -has been trying to wear these shoes constantly   2) Hip pain - he thinks the foot pain contributes to the hip pain  -He is s/p bypass- he does not note improvements in the leg after the bypass -He has decreased sensation in his left lower leg and pain. -pain is 4/10 on average and right now. Feels intermittent, dull, aching. -He does not take any medications for pain -He is a recovering alcoholic.  -he is an active smoker -his back has been killing him, also in is hip and buttock.  -2 years of benefit form his hip and back surgeries -pain is 4-5/10 -the therapy helped his walking and flexibility.  -hamstring is very tight and his wife has been helping to stretch this out.  -he does not want any pain medication, he would rather be in pain.  -his wife is returning to the [place] on 12/29. They plan to joint the gym together.   1) Knee replacement -did PT following surgery  2) Bilateral fat pad atrophy -this is bothering him the most right now -he feels this pain was causing him to walk funny and impacted his hips  3) bilateral hip pin -improved greatly with physical therapy -he was referred to another physician -had an MRI done on his back and pathology was noted at levels 3-4.  -has has a bulge at level 3-4.  -Dr. Vernie Ammons recommended that he follow here -rest helps.  -sometimes can only walk short distances, other times regularly.      Pain Inventory Average Pain 7 Pain Right Now 3 My pain is sharp tingling  In the last 24 hours, has pain interfered with the following? General activity  10 Relation with others 10 Enjoyment of life 0 What TIME of day is your pain at its worst? varies Sleep (in general) Good  Pain is worse with: walking and standing Pain improves with: rest Relief from Meds: 0     Family History  Problem Relation Age of Onset   Pulmonary fibrosis Mother    Breast cancer Mother    Hyperlipidemia Mother    Hypertension Mother    Other Mother        Scarlet Fever   Heart disease Father    Hyperlipidemia Father    Diabetes Brother    Hypertension Brother    Breast cancer Maternal Aunt    Prostate cancer Paternal Grandfather    Hypothyroidism Daughter    Colon cancer Neg Hx    Esophageal cancer Neg Hx    Ulcerative colitis Neg Hx    Uterine cancer Neg Hx    Social History   Socioeconomic History   Marital status: Married    Spouse name: Clarene Critchley   Number of children: 3   Years of education: Not on file   Highest education level: Not on file  Occupational History   Occupation: retired  Tobacco Use   Smoking status: Every Day    Packs/day: 0.25    Years: 53.00    Additional pack years: 0.00    Total pack years: 13.25  Types: Cigarettes   Smokeless tobacco: Never   Tobacco comments:    had first cigarettes age  69, habit started at age 59/today he 37 cig a day  Vaping Use   Vaping Use: Never used  Substance and Sexual Activity   Alcohol use: Not Currently    Comment: 05/10/1991   Drug use: Not Currently    Comment: prior usage of "speed"  when drinking   Sexual activity: Yes  Other Topics Concern   Not on file  Social History Narrative   Recently moved here from [facility] [place]   Has total of 4 children 1 adopted   Right handed   Caffeine 3 cup a day    Live in 2 story home   Social Determinants of Health   Financial Resource Strain: Low Risk  (12/20/2021)   Overall Financial Resource Strain (CARDIA)    Difficulty of Paying Living Expenses: Not hard at all  Food Insecurity: No Food Insecurity (12/20/2021)   Hunger Vital  Sign    Worried About Running Out of Food in the Last Year: Never true    Ran Out of Food in the Last Year: Never true  Transportation Needs: No Transportation Needs (12/20/2021)   PRAPARE - Administrator, Civil Service (Medical): No    Lack of Transportation (Non-Medical): No  Physical Activity: Insufficiently Active (12/20/2021)   Exercise Vital Sign    Days of Exercise per Week: 3 days    Minutes of Exercise per Session: 30 min  Stress: No Stress Concern Present (12/20/2021)   [employer] of Occupational Health - Occupational Stress Questionnaire    Feeling of Stress : Not at all  Social Connections: Moderately Integrated (12/20/2021)   Social Connection and Isolation Panel [NHANES]    Frequency of Communication with Friends and Family: More than three times a week    Frequency of Social Gatherings with Friends and Family: More than three times a week    Attends Religious Services: 1 to 4 times per year    Active Member of [employer] or Organizations: No    Attends Banker Meetings: Never    Marital Status: Married   Past Surgical History:  Procedure Laterality Date   ABDOMINAL AORTOGRAM W/LOWER EXTREMITY N/A 06/23/2020   Procedure: ABDOMINAL AORTOGRAM W/LOWER EXTREMITY;  Surgeon: Cephus Shelling, MD;  Location: [facility];  Service: Cardiovascular;  Laterality: N/A;   ANTERIOR LAT LUMBAR FUSION Right 02/05/2019   Procedure: ATTEMPTED ANTERIOR LATERAL INTERBODY FUSION;  Surgeon: Lisbeth Renshaw, MD;  Location: [facility];  Service: Neurosurgery;  Laterality: Right;  RIGHT LATERAL TRANSPSOAS DISCECTOMY AND FUSION WITH ROBOTIC ASSISTED PEDICLE SCREW STABILIZATION, LUMBAR 4- LUMBAR 5   CATARACT EXTRACTION W/ INTRAOCULAR LENS  IMPLANT, BILATERAL Bilateral 2013   lens implants for cataracts after Lasik   COLONOSCOPY     ENDOVEIN HARVEST OF GREATER SAPHENOUS VEIN Left 06/29/2020   Procedure: HARVEST OF LEFT GREATER SAPHENOUS VEIN;  Surgeon: Cephus Shelling, MD;  Location: [facility];  Service: Vascular;  Laterality: Left;   FEMORAL-POPLITEAL BYPASS GRAFT Left 06/29/2020   Procedure: BYPASS GRAFT LEFT COMMON FEMORAL TO ABOVE KNEE POPLITEAL ARTERY;  Surgeon: Cephus Shelling, MD;  Location: [facility];  Service: Vascular;  Laterality: Left;   HEMORRHOID SURGERY N/A 04/06/2021   Procedure: HEMORRHOIDECTOMY 2 COLUMN;  Surgeon: Andria Meuse, MD;  Location: [facility];  Service: General;  Laterality: N/A;   KNEE ARTHROSCOPY Right 1980   RECTAL EXAM  UNDER ANESTHESIA N/A 04/06/2021   Procedure: ANORECTAL EXAM UNDER ANESTHESIA/ excision of anal polyp;  Surgeon: Andria Meuse, MD;  Location: [facility];  Service: General;  Laterality: N/A;   TONSILLECTOMY     at a very young age, but grew back   TOTAL HIP ARTHROPLASTY Right 05/25/2019   Procedure: RIGHT TOTAL HIP ARTHROPLASTY ANTERIOR APPROACH;  Surgeon: Tarry Kos, MD;  Location: [facility];  Service: Orthopedics;  Laterality: Right;   TOTAL KNEE ARTHROPLASTY Right 09/04/2021   Procedure: RIGHT TOTAL KNEE ARTHROPLASTY;  Surgeon: Tarry Kos, MD;  Location: [facility];  Service: Orthopedics;  Laterality: Right;   Past Medical History:  Diagnosis Date   Alcoholism (HCC)    Arthritis    Cataract    Colon polyps    Complication of anesthesia    HLD (hyperlipidemia)    Nephritis    when pt. was 7 yrs. ols   Peripheral vascular disease (HCC)    PONV (postoperative nausea and vomiting)    "Only when I have aortagram on 06/23/20".   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   HENT: Negative.    Eyes: Negative.   Respiratory: Negative.    Cardiovascular: Negative.   Gastrointestinal: Negative.   Endocrine: Negative.   Genitourinary: Negative.   Musculoskeletal:  Positive for back pain.       B/l foot hip pain Leg numbness or heaviness  Skin: Negative.   Allergic/Immunologic: Negative.   Neurological:  Positive for weakness and numbness.       Tingling  Hematological: Negative.   Psychiatric/Behavioral:  Positive for dysphoric mood and suicidal ideas.   All other systems reviewed and are negative.      Objective:   Physical Exam  Gen: no distress, normal appearing, BP 119/73, weight 164 lbs.  HEENT: oral mucosa pink and moist, NCAT Cardio: Reg rate Chest: normal effort, normal rate of breathing Abd: soft, non-distended Ext: no edema Psych: pleasant, normal affect Skin: intact Neuro:Decreased sensation in left lower extremity.  MSK: Knee popping. Normal gait, good lumbar extension and flexion. B/l fat pad atrophy to feets     Assessment & Plan:   Mr. Macdonell is a 72 year old man who presents to establish care for lack of sensation in left leg following bypass for PAD.  1) Lack of sensation in left lower leg following bypass -Discussed Qutenza as an option for neuropathic pain control. Discussed that this is a capsaicin patch, stronger than capsaicin cream. Discussed that it is currently approved for diabetic peripheral neuropathy and post-herpetic neuralgia, but that it has also shown benefit in treating other forms of neuropathy. Provided patient with link to site to learn more about the patch: https://www.clark.biz/. Discussed  that the patch would be placed in office and benefits usually last 3 months. Discussed that unintended exposure to capsaicin can cause severe irritation of eyes, mucous membranes, respiratory tract,  and skin, but that Qutenza is a local treatment and does not have the systemic side effects of other nerve medications. Discussed that there may be pain, itching, erythema, and decreased sensory function associated with the application of Qutenza. Side effects usually subside within 1 week. A cold pack of analgesic medications can help with these side effects. Blood pressure can also be increased due to pain associated with administration of the patch.   2) Active smoker: -discussed benefits of reducing smoking.  -discussed cold [place] -discussed use of the lozenges and the patch.  -Naltrexone made him sick -his New year's resolution is to stop smoking -he quit for a year using the patch.  -discussed that he has been smoking for 60 years and he is down to 12-13 now.  -discussed that he quite for a year with the nicotine patch  3) Provided with list of supplements that can help with dyslipidemia: 1) Vitamin B3 500-4,000mg  in divided doses daily (would recommend starting low as can cause uncomfortable facial flushing if started at too high a dose) 2) Phytosterols 2.15 grams daily 3) Fermented soy 30-50 grams daily 4) EGCG (found in green tea): 500-1000mg  daily 5) Omega-3 fatty acids 3000-5,000mg  daily 6) Flax seed 40 grams daily 7) Monounsaturated fats 20-40 grams daily (olives, olive oil, nuts), also reduces cardiovascular disease 8) Sesame: 40 grams daily 9) Gamma/delta tocotrienols- a family of unsaturated forms of Vitamin E- 200mg  with dinner 10) Pantethine 900mg  daily in divided doses 11) Resveratrol 250mg  daily 12) N Acetyl Cysteine 2000mg  daily in divided doses 13) Curcumin 2000-5000mg  in divided doses daily 14) Pomegranate juice: 8 ounces daily, also helps to lower blood pressure 15) Pomegranate seeds one cup daily, also helps to lower blood pressure 16) Citrus Bergamot 1000mg  daily, also helps with glucose control and weight loss 17) Vitamin C 500mg  daily 18) Quercetin 500-1000mg   daily 19) Glutathione 20) Probiotics 60-100 billion organisms per day 21) Fiber 22) Oats 23) Aged garlic (can eat as food or supplement of 600-900mg  per day) 24) Chia seeds 25 grams per day 25) Lycopene- carotenoid found in high concentrations in tomatoes. 26) Alpha linolenic acid 27) Flavonoids and anthocyanins 28) Wogonin- flavanoid that enhances reverse cholesterol transport 29) Coenzyme Q10 30) Pantethine- derivative of Vitamin B5: 300mg  three times per day or 450mg  twice per day with or without food 31) Barley and other whole grains 32) Orange juice 33) L- carnitine 34) L- Lysine 35) L- Arginine 36) Almonds 37) Morin 38) Rutin 39) Carnosine 40) Histidine  41) Kaempferol  42) Organosulfur compounds 43) Vitamin E 44) Oleic acid 45) RBO (ferulic acid gammaoryzanol) 46) grape seed extract 47) Red wine 48) Berberine HCL 500mg  daily or twice per day- more effective and with fewer adverse effects that ezetimibe monotherapy 49) red yeast rice 2400- 4800 mg/day 50) chlorella 51) Licorice   4) Right knee pain and popping: -had good benefit from a gel injection 1.5 years ago.  -encouraged repeat viscosupplementation if pain in knee worsens -Provided with a pain relief journal and discussed that it contains foods and lifestyle tips to naturally help to improve pain. Discussed that these lifestyle strategies are also very good for health unlike some medications which can have negative side effects. Discussed that the act of keeping a journal can be therapeutic and helpful to realize patterns what  helps to trigger and alleviate pain.    5) Low back pain -discussed lidocaine patch but he defers.  MRI of spine reviewed with him D/c gabapentin  6) Fat pad atrophy: -continue gabapentin -discussed that he continues to sleep well.  -discussed PT -discussed orthotics and how these made his pain worse -continue foods with healthy fats Discussed extracorporeal shockwave therapy as a  modality for treatment. Discussed that the device looks and feels like a massage gun and I would move it over the area of pain for about 10 minutes. The device releases sound waves to the area of pain and helps to improve blood flow and circulation to improve the healing process. Discuss that this initially induces inflammation and can sometimes cause short-term increase in pain. Discussed that we typically do three weekly treatments, but sometimes up to 6 if needed, and after 6 weeks long term benefits can sometimes be achieved. Discussed that this is an FDA approved device, but not covered by insurance and would cost $60 per session. Will scheduled patient for 6 consecutive appointments and can cancel latter three if benefits are achieved after first three sessions.    -Discussed Qutenza as an option for neuropathic pain control. Discussed that this is a capsaicin patch, stronger than capsaicin cream. Discussed that it is currently approved for diabetic peripheral neuropathy and post-herpetic neuralgia, but that it has also shown benefit in treating other forms of neuropathy. Provided patient with link to site to learn more about the patch: https://www.clark.biz/. Discussed that the patch would be placed in office and benefits usually last 3 months. Discussed that unintended exposure to capsaicin can cause severe irritation of eyes, mucous membranes, respiratory tract, and skin, but that Qutenza is a local treatment and does not have the systemic side effects of other nerve medications. Discussed that there may be pain, itching, erythema, and decreased sensory function associated with the application of Qutenza. Side effects usually subside within 1 week. A cold pack of analgesic medications can help with these side effects. Blood pressure can also be increased due to pain associated with administration of the patch.   -Discussed following foods that may reduce pain: 1) Ginger (especially studied for  arthritis)- reduce leukotriene production to decrease inflammation 2) Blueberries- high in phytonutrients that decrease inflammation 3) Salmon- marine omega-3s reduce joint swelling and pain 4) Pumpkin seeds- reduce inflammation 5) dark chocolate- reduces inflammation 6) turmeric- reduces inflammation 7) tart cherries - reduce pain and stiffness 8) extra virgin olive oil - its compound olecanthal helps to block prostaglandins  9) chili peppers- can be eaten or applied topically via capsaicin 10) mint- helpful for headache, muscle aches, joint pain, and itching 11) garlic- reduces inflammation  Link to further information on diet for chronic pain: http://www.bray.com/   7) Bilateral hip pain:  -discussed current pain, prior hip surgery.  -discussed great improvement with PT -discussed that could be secondary to lumbar facet arthropathy/retrolisthesis.  -will refer to PT. If not effective, will consider medial branch blocks  >40 minutes spent in discussion of his hip pain, foot pain, discussed fat pad atrophy, discussed Qutenza, extracorporeal shockwave therapy, PT, negative response to orthotics and their great expense, discussed his family, his excellent blood flow, red light therapy, that he has been a drinker for 32 years

## 2022-08-27 ENCOUNTER — Encounter
Payer: Medicare Other | Attending: Physical Medicine and Rehabilitation | Admitting: Physical Medicine and Rehabilitation

## 2022-08-27 ENCOUNTER — Encounter: Payer: Self-pay | Admitting: Physical Medicine and Rehabilitation

## 2022-08-27 VITALS — BP 108/69 | HR 85 | Ht 68.0 in | Wt 162.0 lb

## 2022-08-27 DIAGNOSIS — M79672 Pain in left foot: Secondary | ICD-10-CM | POA: Diagnosis present

## 2022-08-27 DIAGNOSIS — M25552 Pain in left hip: Secondary | ICD-10-CM | POA: Insufficient documentation

## 2022-08-27 DIAGNOSIS — M25551 Pain in right hip: Secondary | ICD-10-CM | POA: Insufficient documentation

## 2022-08-27 DIAGNOSIS — M79671 Pain in right foot: Secondary | ICD-10-CM | POA: Insufficient documentation

## 2022-08-27 NOTE — Patient Instructions (Signed)
Turmeric to reduce inflammation--can be used in cooking or taken as a supplement.  Benefits of turmeric:  -Highly anti-inflammatory  -Increases antioxidants  -Improves memory, attention, brain disease  -Lowers risk of heart disease  -May help prevent cancer  -Decreases pain  -Alleviates depression  -Delays aging and decreases risk of chronic disease  -Consume with black pepper to increase absorption    Turmeric Milk Recipe:  1 cup milk  1 tsp turmeric  1 tsp cinnamon  1 tsp grated ginger (optional)  Black pepper (boosts the anti-inflammatory properties of turmeric).  1 tsp honey  

## 2022-08-27 NOTE — Progress Notes (Addendum)
Subjective:    Patient ID: Gerald Carpenter, male    DOB: 1950-03-18, 72 y.o.   MRN: [id number]  HPI Gerald Carpenter is a 72 year old man who presents for follow-up of PAD with legs locking up.  1) Feet pain: -pain has greatly improved since he removed the inserts [place] and [place]he parked in the upper structure and he walked here without issue -any further than that and he feels pain -wears supportive shoes -has been trying to wear these shoes constantly   2) Hip pain -hurting when walking uphill - he thinks the foot pain contributes to the hip pain -had improvement with PT and is still doing some of the exercises but some he can't do without help -He is s/p bypass- he does not note improvements in the leg after the bypass -He has decreased sensation in his left lower leg and pain. -pain is 4/10 on average and right now. Feels intermittent, dull, aching. -He does not take any medications for pain -He is a recovering alcoholic.  -he is an active smoker -his back has been killing him, also in is hip and buttock.  -2 years of benefit form his hip and back surgeries -pain is 4-5/10 -the therapy helped his walking and flexibility.  -hamstring is very tight and his wife has been helping to stretch this out.  -he does not want any pain medication, he would rather be in pain.  -his wife is returning to the [place] on 12/29. They plan to joint the gym together.   1) Knee replacement -did PT following surgery  2) Bilateral fat pad atrophy -this is bothering him the most right now -he feels this pain was causing him to walk funny and impacted his hips  3) bilateral hip pin -improved greatly with physical therapy -he was referred to another physician -had an MRI done on his back and pathology was noted at levels 3-4.  -has has a bulge at level 3-4.  -Dr. Vernie Ammons recommended that he follow here -rest helps.   -sometimes can only walk short distances, other times regularly.      Pain Inventory Average Pain 10 Pain Right Now 0 My pain is sharp tingling  In the last 24 hours, has pain interfered with the following? General activity 8 Relation with others 3 Enjoyment of life 9 What TIME of day is your pain at its worst? varies Sleep (in general) Good  Pain is worse with: walking Pain improves with: rest Relief from Meds: 0     Family History  Problem Relation Age of Onset   Pulmonary fibrosis Mother    Breast cancer Mother    Hyperlipidemia Mother    Hypertension Mother    Other Mother        Scarlet Fever   Heart disease Father    Hyperlipidemia Father    Diabetes Brother    Hypertension Brother    Breast cancer Maternal Aunt    Prostate cancer Paternal Grandfather    Hypothyroidism Daughter    Colon cancer Neg Hx    Esophageal cancer Neg Hx    Ulcerative colitis Neg Hx    Uterine cancer Neg Hx    Social History   Socioeconomic History   Marital status: Married    Spouse name: Clarene Critchley   Number of children: 3   Years of education: Not on file   Highest education level: Not on  file  Occupational History   Occupation: retired  Tobacco Use   Smoking status: Every Day    Packs/day: 0.25    Years: 53.00    Additional pack years: 0.00    Total pack years: 13.25    Types: Cigarettes   Smokeless tobacco: Never   Tobacco comments:    had first cigarettes age  80, habit started at age 80/today he 76 cig a day  Vaping Use   Vaping Use: Never used  Substance and Sexual Activity   Alcohol use: Not Currently    Comment: 05/10/1991   Drug use: Not Currently    Comment: prior usage of "speed"  when drinking   Sexual activity: Yes  Other Topics Concern   Not on file  Social History Narrative   Recently moved here from [facility], [place]   Has total of 4 children 1 adopted   Right handed   Caffeine 3 cup a day    Live in 2 story home   Social Determinants of Health    Financial Resource Strain: Low Risk  (12/20/2021)   Overall Financial Resource Strain (CARDIA)    Difficulty of Paying Living Expenses: Not hard at all  Food Insecurity: No Food Insecurity (12/20/2021)   Hunger Vital Sign    Worried About Running Out of Food in the Last Year: Never true    Ran Out of Food in the Last Year: Never true  Transportation Needs: No Transportation Needs (12/20/2021)   PRAPARE - Administrator, Civil Service (Medical): No    Lack of Transportation (Non-Medical): No  Physical Activity: Insufficiently Active (12/20/2021)   Exercise Vital Sign    Days of Exercise per Week: 3 days    Minutes of Exercise per Session: 30 min  Stress: No Stress Concern Present (12/20/2021)   [employer] of Occupational Health - Occupational Stress Questionnaire    Feeling of Stress : Not at all  Social Connections: Moderately Integrated (12/20/2021)   Social Connection and Isolation Panel [NHANES]    Frequency of Communication with Friends and Family: More than three times a week    Frequency of Social Gatherings with Friends and Family: More than three times a week    Attends Religious Services: 1 to 4 times per year    Active Member of [employer] or Organizations: No    Attends Banker Meetings: Never    Marital Status: Married   Past Surgical History:  Procedure Laterality Date   ABDOMINAL AORTOGRAM W/LOWER EXTREMITY N/A 06/23/2020   Procedure: ABDOMINAL AORTOGRAM W/LOWER EXTREMITY;  Surgeon: Cephus Shelling, MD;  Location: [facility];  Service: Cardiovascular;  Laterality: N/A;   ANTERIOR LAT LUMBAR FUSION Right 02/05/2019   Procedure: ATTEMPTED ANTERIOR LATERAL INTERBODY FUSION;  Surgeon: Lisbeth Renshaw, MD;  Location: [facility];  Service: Neurosurgery;  Laterality: Right;  RIGHT LATERAL TRANSPSOAS DISCECTOMY AND FUSION WITH ROBOTIC ASSISTED PEDICLE SCREW STABILIZATION, LUMBAR 4- LUMBAR 5   CATARACT EXTRACTION W/ INTRAOCULAR LENS  IMPLANT,  BILATERAL Bilateral 2013   lens implants for cataracts after Lasik   COLONOSCOPY     ENDOVEIN HARVEST OF GREATER SAPHENOUS VEIN Left 06/29/2020   Procedure: HARVEST OF LEFT GREATER SAPHENOUS VEIN;  Surgeon: Cephus Shelling, MD;  Location: [facility];  Service: Vascular;  Laterality: Left;   FEMORAL-POPLITEAL BYPASS GRAFT Left 06/29/2020   Procedure: BYPASS GRAFT LEFT COMMON FEMORAL TO ABOVE KNEE POPLITEAL ARTERY;  Surgeon: Cephus Shelling, MD;  Location: [facility];  Service:  Vascular;  Laterality: Left;   HEMORRHOID SURGERY N/A 04/06/2021   Procedure: HEMORRHOIDECTOMY 2 COLUMN;  Surgeon: Andria Meuse, MD;  Location: [facility];  Service: General;  Laterality: N/A;   KNEE ARTHROSCOPY Right 1980   RECTAL EXAM UNDER ANESTHESIA N/A 04/06/2021   Procedure: ANORECTAL EXAM UNDER ANESTHESIA/ excision of anal polyp;  Surgeon: Andria Meuse, MD;  Location: [facility];  Service: General;  Laterality: N/A;   TONSILLECTOMY     at a very young age, but grew back   TOTAL HIP ARTHROPLASTY Right 05/25/2019   Procedure: RIGHT TOTAL HIP ARTHROPLASTY ANTERIOR APPROACH;  Surgeon: Tarry Kos, MD;  Location: [facility];  Service: Orthopedics;  Laterality: Right;   TOTAL KNEE ARTHROPLASTY Right 09/04/2021   Procedure: RIGHT TOTAL KNEE ARTHROPLASTY;  Surgeon: Tarry Kos, MD;  Location: [facility];  Service: Orthopedics;  Laterality: Right;   Past Medical History:  Diagnosis Date   Alcoholism (HCC)    Arthritis    Cataract    Colon polyps    Complication of anesthesia    HLD (hyperlipidemia)    Nephritis    when pt. was 7 yrs. ols   Peripheral vascular disease (HCC)    PONV (postoperative nausea and vomiting)    "Only when I have aortagram on 06/23/20".   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   Substance abuse (HCC)    BP 108/69   Pulse 85   Ht 5\' 8"  (1.727 m)   Wt 162 lb (73.5 kg)   SpO2 95%   BMI 24.63 kg/m   Opioid Risk Score:   Fall Risk Score:   `1  Depression screen [facility] 2/9     08/27/2022    9:26 AM 07/24/2022   10:00 AM 05/28/2022   11:21 AM 03/23/2022   10:10 AM 12/20/2021    9:18 AM 10/25/2021    7:57 AM 04/13/2021    9:19 AM  Depression screen PHQ 2/9  Decreased Interest 0 0 0 0 0 0 0  Down, Depressed, Hopeless 0 0 0 0 0 0 0  PHQ - 2 Score 0 0 0 0 0 0 0     Review of Systems  Constitutional: Negative.   HENT: Negative.    Eyes: Negative.   Respiratory: Negative.    Cardiovascular: Negative.   Gastrointestinal: Negative.   Endocrine: Negative.   Genitourinary: Negative.   Musculoskeletal:  Positive for back pain.       B/l foot hip pain Leg numbness or heaviness  Skin: Negative.   Allergic/Immunologic: Negative.   Neurological:  Positive for weakness and numbness.       Tingling  Hematological: Negative.   Psychiatric/Behavioral:  Positive for dysphoric mood and suicidal ideas.   All other systems reviewed and are negative.      Objective:   Physical Exam  Gen: no distress, normal appearing, BP 119/73, weight 164 lbs.  HEENT: oral mucosa pink and moist, NCAT Cardio: Reg rate Chest: normal effort, normal rate of breathing Abd: soft, non-distended Ext: no edema Psych: pleasant, normal affect Skin: intact Neuro:Decreased sensation in left lower extremity.  MSK: Knee popping. Normal gait, good lumbar extension and flexion. B/l fat pad atrophy to feets     Assessment & Plan:   Mr. Ostwald is a 72 year old man who presents to establish care for lack of sensation in left leg following bypass for PAD.  1) Lack of sensation in left lower leg following bypass -Discussed Qutenza as  an option for neuropathic pain control. Discussed that this is a capsaicin patch, stronger than capsaicin cream. Discussed that it is currently approved for diabetic peripheral neuropathy and post-herpetic neuralgia, but that it has also shown benefit in treating other forms of neuropathy. Provided patient with link to site to learn more  about the patch: https://www.clark.biz/. Discussed that the patch would be placed in office and benefits usually last 3 months. Discussed that unintended exposure to capsaicin can cause severe irritation of eyes, mucous membranes, respiratory tract, and skin, but that Qutenza is a local treatment and does not have the systemic side effects of other nerve medications. Discussed that there may be pain, itching, erythema, and decreased sensory function associated with the application of Qutenza. Side effects usually subside within 1 week. A cold pack of analgesic medications can help with these side effects. Blood pressure can also be increased due to pain associated with administration of the patch.   2) Active smoker: -discussed benefits of reducing smoking.  -discussed cold [place] -discussed use of the lozenges and the patch.  -Naltrexone made him sick -his New year's resolution is to stop smoking -he quit for a year using the patch.  -discussed that he has been smoking for 60 years and he is down to 12-13 now.  -discussed that he quite for a year with the nicotine patch  3) Provided with list of supplements that can help with dyslipidemia: 1) Vitamin B3 500-4,000mg  in divided doses daily (would recommend starting low as can cause uncomfortable facial flushing if started at too high a dose) 2) Phytosterols 2.15 grams daily 3) Fermented soy 30-50 grams daily 4) EGCG (found in green tea): 500-1000mg  daily 5) Omega-3 fatty acids 3000-5,000mg  daily 6) Flax seed 40 grams daily 7) Monounsaturated fats 20-40 grams daily (olives, olive oil, nuts), also reduces cardiovascular disease 8) Sesame: 40 grams daily 9) Gamma/delta tocotrienols- a family of unsaturated forms of Vitamin E- 200mg  with dinner 10) Pantethine 900mg  daily in divided doses 11) Resveratrol 250mg  daily 12) N Acetyl Cysteine 2000mg  daily in divided doses 13) Curcumin 2000-5000mg  in divided doses daily 14) Pomegranate juice: 8  ounces daily, also helps to lower blood pressure 15) Pomegranate seeds one cup daily, also helps to lower blood pressure 16) Citrus Bergamot 1000mg  daily, also helps with glucose control and weight loss 17) Vitamin C 500mg  daily 18) Quercetin 500-1000mg  daily 19) Glutathione 20) Probiotics 60-100 billion organisms per day 21) Fiber 22) Oats 23) Aged garlic (can eat as food or supplement of 600-900mg  per day) 24) Chia seeds 25 grams per day 25) Lycopene- carotenoid found in high concentrations in tomatoes. 26) Alpha linolenic acid 27) Flavonoids and anthocyanins 28) Wogonin- flavanoid that enhances reverse cholesterol transport 29) Coenzyme Q10 30) Pantethine- derivative of Vitamin B5: 300mg  three times per day or 450mg  twice per day with or without food 31) Barley and other whole grains 32) Orange juice 33) L- carnitine 34) L- Lysine 35) L- Arginine 36) Almonds 37) Morin 38) Rutin 39) Carnosine 40) Histidine  41) Kaempferol  42) Organosulfur compounds 43) Vitamin E 44) Oleic acid 45) RBO (ferulic acid gammaoryzanol) 46) grape seed extract 47) Red wine 48) Berberine HCL 500mg  daily or twice per day- more effective and with fewer adverse effects that ezetimibe monotherapy 49) red yeast rice 2400- 4800 mg/day 50) chlorella 51) Licorice   4) Right knee pain and popping: -had good benefit from a gel injection 1.5 years ago.  -encouraged repeat viscosupplementation if pain in knee worsens -Provided  with a pain relief journal and discussed that it contains foods and lifestyle tips to naturally help to improve pain. Discussed that these lifestyle strategies are also very good for health unlike some medications which can have negative side effects. Discussed that the act of keeping a journal can be therapeutic and helpful to realize patterns what helps to trigger and alleviate pain.    5) Low back pain -discussed lidocaine patch but he defers.  MRI of spine reviewed with him D/c  gabapentin  6) Fat pad atrophy: -continue red light therapy -continue gabapentin -discussed that he continues to sleep well.  -discussed PT -discussed orthotics and how these made his pain worse -continue foods with healthy fats Discussed extracorporeal shockwave therapy as a modality for treatment. Discussed that the device looks and feels like a massage gun and I would move it over the area of pain for about 10 minutes. The device releases sound waves to the area of pain and helps to improve blood flow and circulation to improve the healing process. Discuss that this initially induces inflammation and can sometimes cause short-term increase in pain. Discussed that we typically do three weekly treatments, but sometimes up to 6 if needed, and after 6 weeks long term benefits can sometimes be achieved. Discussed that this is an FDA approved device, but not covered by insurance and would cost $60 per session. Will scheduled patient for 6 consecutive appointments and can cancel latter three if benefits are achieved after first three sessions.    -Discussed Qutenza as an option for neuropathic pain control. Discussed that this is a capsaicin patch, stronger than capsaicin cream. Discussed that it is currently approved for diabetic peripheral neuropathy and post-herpetic neuralgia, but that it has also shown benefit in treating other forms of neuropathy. Provided patient with link to site to learn more about the patch: https://www.clark.biz/. Discussed that the patch would be placed in office and benefits usually last 3 months. Discussed that unintended exposure to capsaicin can cause severe irritation of eyes, mucous membranes, respiratory tract, and skin, but that Qutenza is a local treatment and does not have the systemic side effects of other nerve medications. Discussed that there may be pain, itching, erythema, and decreased sensory function associated with the application of Qutenza. Side effects  usually subside within 1 week. A cold pack of analgesic medications can help with these side effects. Blood pressure can also be increased due to pain associated with administration of the patch.   -Discussed following foods that may reduce pain: 1) Ginger (especially studied for arthritis)- reduce leukotriene production to decrease inflammation 2) Blueberries- high in phytonutrients that decrease inflammation 3) Salmon- marine omega-3s reduce joint swelling and pain 4) Pumpkin seeds- reduce inflammation 5) dark chocolate- reduces inflammation 6) turmeric- reduces inflammation 7) tart cherries - reduce pain and stiffness 8) extra virgin olive oil - its compound olecanthal helps to block prostaglandins  9) chili peppers- can be eaten or applied topically via capsaicin 10) mint- helpful for headache, muscle aches, joint pain, and itching 11) garlic- reduces inflammation  Link to further information on diet for chronic pain: http://www.bray.com/   7) Bilateral hip pain:  -d/c gabapentin  -discussed that patient notes his pain started after starting atorvastatin, reviewed LDL from March and it is 46, messaged Dr. Veto Kemps to see if he would be comfortable with Atorvastatin being reduced to 20mg  daily. -discussed current pain, prior hip surgery.  -discussed great improvement with PT -discussed that could be secondary to lumbar facet  arthropathy/retrolisthesis.  -will refer back to PT as he is continuing some home exercises but can't do them all at home. -continue red light therapy -continue collagen  -continue follow-up with ortho -discussed that when it starts, pain is very severe.  Turmeric to reduce inflammation--can be used in cooking or taken as a supplement.  Benefits of turmeric:  -Highly anti-inflammatory  -Increases antioxidants  -Improves memory, attention, brain disease  -Lowers risk of heart  disease  -May help prevent cancer  -Decreases pain  -Alleviates depression  -Delays aging and decreases risk of chronic disease  -Consume with black pepper to increase absorption    Turmeric Milk Recipe:  1 cup milk  1 tsp turmeric  1 tsp cinnamon  1 tsp grated ginger (optional)  Black pepper (boosts the anti-inflammatory properties of turmeric).  1 tsp honey

## 2022-08-30 ENCOUNTER — Ambulatory Visit (INDEPENDENT_AMBULATORY_CARE_PROVIDER_SITE_OTHER): Payer: Medicare Other | Admitting: Physician Assistant

## 2022-08-30 ENCOUNTER — Other Ambulatory Visit (INDEPENDENT_AMBULATORY_CARE_PROVIDER_SITE_OTHER): Payer: Medicare Other

## 2022-08-30 ENCOUNTER — Encounter: Payer: Self-pay | Admitting: Physician Assistant

## 2022-08-30 DIAGNOSIS — M25552 Pain in left hip: Secondary | ICD-10-CM

## 2022-08-30 DIAGNOSIS — M25551 Pain in right hip: Secondary | ICD-10-CM

## 2022-08-30 DIAGNOSIS — Z96651 Presence of right artificial knee joint: Secondary | ICD-10-CM

## 2022-08-30 NOTE — Progress Notes (Signed)
Post-Op Visit Note   Patient: Gerald Carpenter           Date of Birth: 28-Apr-1950           MRN: [id number] Visit Date: 08/30/2022 PCP: Loyola Mast, MD   Assessment & Plan:  Chief Complaint:  Chief Complaint  Patient presents with   Right Knee - Follow-up    Right total knee arthroplasty 09/04/2021   Visit Diagnoses:  1. Status post total right knee replacement   2. Bilateral hip pain     Plan: Patient is a pleasant 72 year old gentleman who comes in today 1 year status post right total knee replacement 09/04/2021.  He has been doing great.  No complaints.  The other issue he brings up today is bilateral hip pain.  Pain is primarily to the anterior hip just over the hip flexors.  Pain on both sides is equally as bad.  Symptoms have been ongoing for the past 1 to 2 years and have progressively worsened.  No known injury.  Of note, he is status post right total hip replacement in April 2022.  He denies any radiation of pain past this area.  No mechanical symptoms.  Symptoms occur when he is walking any distance.  He does get some improvement when he is bending over a shopping cart in the store.  He does not take medication for this.  He does have a history of lumbar pathology as well as fusion several years ago.  He has been to physical therapy for this problem in the past which did initially help.  He saw pain management recently who has put in a new referral to restart therapy.  No cortisone injection the left hip joint.  Examination of his right knee reveals no effusion.  Range of motion 0 to 130 degrees.  Stable valgus varus stress.  He is neurovascular intact distally.  Bilateral hip exam: Negative logroll and negative FADIR.  He does have slight pain in the hip flexors with Stinchfield testing.  Unremarkable lumbar exam.  No focal weakness.  No red flag signs.  Impression is status post right total knee replacement doing well.  Dental prophylaxis reinforced for another year.  Follow-up  in 1 year for repeat evaluation and 2 view x-rays of the right knee.  In regards to the hips, it is somewhat hard to distinguish whether his pain is coming from his hips referred from his back.  He will start physical therapy next week and if his symptoms do not improve he will let me know we may refer him to Dr. Shon Baton for diagnostic cortisone injection to the left hip joint.  He will call us with concerns or questions in the meantime.  Follow-Up Instructions: Return in about 1 year (around 08/30/2023).   Orders:  Orders Placed This Encounter  Procedures   XR Knee 1-2 Views Right   XR Pelvis 1-2 Views   No orders of the defined types were placed in this encounter.   Imaging: No results found.  PMFS History: Patient Active Problem List   Diagnosis Date Noted   Status post total right knee replacement 09/04/2021   Leukocytosis 10/11/2020   PAD (peripheral artery disease) (HCC) 06/29/2020   Prediabetes 04/22/2020   Chronic kidney disease, stage 3a (HCC) 04/15/2020   Severe tobacco dependence 04/12/2020   Lower urinary tract symptoms (LUTS) 04/12/2020   History of colon polyps 04/12/2020   Atherosclerosis of native arteries of extremity with intermittent claudication (HCC) 03/01/2020  Pain in both feet 07/31/2019   Status post total replacement of right hip 05/25/2019   Lumbar radiculopathy 02/05/2019   Past Medical History:  Diagnosis Date   Alcoholism (HCC)    Arthritis    Cataract    Colon polyps    Complication of anesthesia    HLD (hyperlipidemia)    Nephritis    when pt. was 7 yrs. ols   Peripheral vascular disease (HCC)    PONV (postoperative nausea and vomiting)    "Only when I have aortagram on 06/23/20".   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   Substance abuse (HCC)     Family History  Problem Relation Age of Onset   Pulmonary fibrosis Mother    Breast cancer Mother    Hyperlipidemia Mother    Hypertension Mother    Other Mother        Scarlet  Fever   Heart disease Father    Hyperlipidemia Father    Diabetes Brother    Hypertension Brother    Breast cancer Maternal Aunt    Prostate cancer Paternal Grandfather    Hypothyroidism Daughter    Colon cancer Neg Hx    Esophageal cancer Neg Hx    Ulcerative colitis Neg Hx    Uterine cancer Neg Hx     Past Surgical History:  Procedure Laterality Date   ABDOMINAL AORTOGRAM W/LOWER EXTREMITY N/A 06/23/2020   Procedure: ABDOMINAL AORTOGRAM W/LOWER EXTREMITY;  Surgeon: Cephus Shelling, MD;  Location: [facility];  Service: Cardiovascular;  Laterality: N/A;   ANTERIOR LAT LUMBAR FUSION Right 02/05/2019   Procedure: ATTEMPTED ANTERIOR LATERAL INTERBODY FUSION;  Surgeon: Lisbeth Renshaw, MD;  Location: [facility];  Service: Neurosurgery;  Laterality: Right;  RIGHT LATERAL TRANSPSOAS DISCECTOMY AND FUSION WITH ROBOTIC ASSISTED PEDICLE SCREW STABILIZATION, LUMBAR 4- LUMBAR 5   CATARACT EXTRACTION W/ INTRAOCULAR LENS  IMPLANT, BILATERAL Bilateral 2013   lens implants for cataracts after Lasik   COLONOSCOPY     ENDOVEIN HARVEST OF GREATER SAPHENOUS VEIN Left 06/29/2020   Procedure: HARVEST OF LEFT GREATER SAPHENOUS VEIN;  Surgeon: Cephus Shelling, MD;  Location: [facility];  Service: Vascular;  Laterality: Left;   FEMORAL-POPLITEAL BYPASS GRAFT Left 06/29/2020   Procedure: BYPASS GRAFT LEFT COMMON FEMORAL TO ABOVE KNEE POPLITEAL ARTERY;  Surgeon: Cephus Shelling, MD;  Location: [facility];  Service: Vascular;  Laterality: Left;   HEMORRHOID SURGERY N/A 04/06/2021   Procedure: HEMORRHOIDECTOMY 2 COLUMN;  Surgeon: Andria Meuse, MD;  Location: [facility];  Service: General;  Laterality: N/A;   KNEE ARTHROSCOPY Right 1980   RECTAL EXAM UNDER ANESTHESIA N/A 04/06/2021   Procedure: ANORECTAL EXAM UNDER ANESTHESIA/ excision of anal polyp;  Surgeon: Andria Meuse, MD;  Location: [facility];  Service: General;  Laterality: N/A;   TONSILLECTOMY      at a very young age, but grew back   TOTAL HIP ARTHROPLASTY Right 05/25/2019   Procedure: RIGHT TOTAL HIP ARTHROPLASTY ANTERIOR APPROACH;  Surgeon: Tarry Kos, MD;  Location: [facility];  Service: Orthopedics;  Laterality: Right;   TOTAL KNEE ARTHROPLASTY Right 09/04/2021   Procedure: RIGHT TOTAL KNEE ARTHROPLASTY;  Surgeon: Tarry Kos, MD;  Location: [facility];  Service: Orthopedics;  Laterality: Right;   Social History   Occupational History   Occupation: retired  Tobacco Use   Smoking status: Every Day    Current packs/day: 0.25    Average packs/day: 0.3 packs/day for 53.0 years (13.3 ttl pk-yrs)  Types: Cigarettes   Smokeless tobacco: Never   Tobacco comments:    had first cigarettes age  70, habit started at age 66/today he 52 cig a day  Vaping Use   Vaping status: Never Used  Substance and Sexual Activity   Alcohol use: Not Currently    Comment: 05/10/1991   Drug use: Not Currently    Comment: prior usage of "speed"  when drinking   Sexual activity: Yes

## 2022-09-05 NOTE — Therapy (Signed)
OUTPATIENT PHYSICAL THERAPY LOWER EXTREMITY EVALUATION   Patient Name: Gerald Carpenter MRN: [id number] DOB:07-30-1950, 72 y.o., male Today's Date: 09/06/2022  END OF SESSION:  PT End of Session - 09/06/22 0807     Visit Number 1    Number of Visits 12    PT Start Time 0800    PT Stop Time 0845    PT Time Calculation (min) 45 min    Activity Tolerance Patient tolerated treatment well             Past Medical History:  Diagnosis Date   Alcoholism (HCC)    Arthritis    Cataract    Colon polyps    Complication of anesthesia    HLD (hyperlipidemia)    Nephritis    when pt. was 7 yrs. ols   Peripheral vascular disease (HCC)    PONV (postoperative nausea and vomiting)    "Only when I have aortagram on 06/23/20".   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   Substance abuse [facility])    Past Surgical History:  Procedure Laterality Date   ABDOMINAL AORTOGRAM W/LOWER EXTREMITY N/A 06/23/2020   Procedure: ABDOMINAL AORTOGRAM W/LOWER EXTREMITY;  Surgeon: Cephus Shelling, MD;  [facility];  Service: Cardiovascular;  Laterality: N/A;   ANTERIOR LAT LUMBAR FUSION Right 02/05/2019   Procedure: ATTEMPTED ANTERIOR LATERAL INTERBODY FUSION;  Surgeon: Lisbeth Renshaw, MD;  Location: [facility];  Service: Neurosurgery;  Laterality: Right;  RIGHT LATERAL TRANSPSOAS DISCECTOMY AND FUSION WITH ROBOTIC ASSISTED PEDICLE SCREW STABILIZATION, LUMBAR 4- LUMBAR 5   CATARACT EXTRACTION W/ INTRAOCULAR LENS  IMPLANT, BILATERAL Bilateral 2013   lens implants for cataracts after Lasik   COLONOSCOPY     ENDOVEIN HARVEST OF GREATER SAPHENOUS VEIN Left 06/29/2020   Procedure: HARVEST OF LEFT GREATER SAPHENOUS VEIN;  Surgeon: Cephus Shelling, MD;  Location: [facility];  Service: Vascular;  Laterality: Left;   FEMORAL-POPLITEAL BYPASS GRAFT Left 06/29/2020   Procedure: BYPASS GRAFT LEFT COMMON FEMORAL TO ABOVE KNEE POPLITEAL ARTERY;  Surgeon: Cephus Shelling, MD;  Location: [facility];   Service: Vascular;  Laterality: Left;   HEMORRHOID SURGERY N/A 04/06/2021   Procedure: HEMORRHOIDECTOMY 2 COLUMN;  Surgeon: Andria Meuse, MD;  Location: [facility];  Service: General;  Laterality: N/A;   KNEE ARTHROSCOPY Right 1980   RECTAL EXAM UNDER ANESTHESIA N/A 04/06/2021   Procedure: ANORECTAL EXAM UNDER ANESTHESIA/ excision of anal polyp;  Surgeon: Andria Meuse, MD;  Location: [facility];  Service: General;  Laterality: N/A;   TONSILLECTOMY     at a very young age, but grew back   TOTAL HIP ARTHROPLASTY Right 05/25/2019   Procedure: RIGHT TOTAL HIP ARTHROPLASTY ANTERIOR APPROACH;  Surgeon: Tarry Kos, MD;  Location: [facility];  Service: Orthopedics;  Laterality: Right;   TOTAL KNEE ARTHROPLASTY Right 09/04/2021   Procedure: RIGHT TOTAL KNEE ARTHROPLASTY;  Surgeon: Tarry Kos, MD;  Location: [facility];  Service: Orthopedics;  Laterality: Right;   Patient Active Problem List   Diagnosis Date Noted   Status post total right knee replacement 09/04/2021   Leukocytosis 10/11/2020   PAD (peripheral artery disease) (HCC) 06/29/2020   Prediabetes 04/22/2020   Chronic kidney disease, stage 3a (HCC) 04/15/2020   Severe tobacco dependence 04/12/2020   Lower urinary tract symptoms (LUTS) 04/12/2020   History of colon polyps 04/12/2020   Atherosclerosis of native arteries of extremity with intermittent claudication (HCC) 03/01/2020   Pain in both feet 07/31/2019  Status post total replacement of right hip 05/25/2019   Lumbar radiculopathy 02/05/2019    PCP: Loyola Mast MD  REFERRING PROVIDER: Horton Chin, MD  REFERRING DIAG: [phone] (ICD-10-CM) - Bilateral hip pain  THERAPY DIAG:  No diagnosis found.  Rationale for Evaluation and Treatment: Rehabilitation  ONSET DATE: chronic   SUBJECTIVE:   SUBJECTIVE STATEMENT: Pt with continued hip pain which has been ongoing for > 1 yr.  He has had Rt THA and after hip surgery he  was fine. He now has pain in both hips and only when he walks.  He can stand for short periods with min increase, tolerable.  If he is walking around store if he is not leaning on a cart he has to stop and rest for a few moments.  This takes the pain away.  "It feels like my legs want to stop"  PA is considering a L hip injection if things continue like this.  He has no complaints of sensory changes or weakness.  He has always been very active.  He had PT before with min benefit.    PERTINENT HISTORY: Rt THA 2021, R TKA 2023, lumbar fusion (L4-L5) 2020 PAIN:  Are you having pain? Yes: NPRS scale: none/10 Pain location: bilateral anterior hips  Pain description: deep ache  Aggravating factors: walking  Relieving factors: standing , leaning , holding on to something, stopping  Pain goes to 10/10 with walking 10 min   PRECAUTIONS: None  RED FLAGS: None   WEIGHT BEARING RESTRICTIONS: No  FALLS:  Has patient fallen in last 6 months? No  LIVING ENVIRONMENT: Lives with: lives with their spouse Lives in: House/apartment Stairs: Yes: Internal: 12 steps; on right going up Has following equipment at home: None has a cane but does not use it   OCCUPATION: Retired, [employer] industrial   PLOF: Independent  PATIENT GOALS: Patient wants to walk without pain   NEXT MD VISIT: as needed   OBJECTIVE:   DIAGNOSTIC FINDINGS:  08/2022 He does have moderate  degenerative changes to the left hip joint  Rt hip prosthesis in place.  IMPRESSION: Lumbar  1. Status post posterior instrumented fusion and decompression at L4-L5 without residual spinal canal or neural foraminal stenosis. 2. Adjacent segment disease at L3-L4 with trace retrolisthesis, disc bulge, and bilateral facet arthropathy resulting in mild spinal canal and bilateral subarticular zone without evidence of frank nerve root impingement, and mild left worse than right neural foraminal stenosis. 3. Mild left worse than right subarticular  zone narrowing L2-L3.    PATIENT SURVEYS:  FOTO 50%  COGNITION: Overall cognitive status: Within functional limits for tasks assessed     SENSATION: WFL   MUSCLE LENGTH: Hamstrings: tight  Thomas test: neg Quads: tight in prone   POSTURE: increased thoracic kyphosis, flexed trunk , and Low Rt shoulder   PALPATION: TTP  LUMBAR ROM:   Active  A/PROM  eval  Flexion Distal shin Hamstring tightness   Extension 50%  Right lateral flexion 50%  Left lateral flexion 50%  Right rotation 50%  Left rotation 50%  Stretch, no pain    LOWER EXTREMITY ROM:  Passive ROM Right eval Left eval  Hip flexion WFL, min ant hip pain  WFL  Hip extension    Hip abduction    Hip adduction    Hip internal rotation Min tight WFL  Hip external rotation Min tight  WFL   Knee flexion [facility]  Knee extension [facility]  Ankle  dorsiflexion    Ankle plantarflexion    Ankle inversion    Ankle eversion     (Blank rows = not tested)  LOWER EXTREMITY MMT:  MMT Right eval Left eval  Hip flexion  4 4  Hip extension    Hip abduction    Hip adduction    Hip internal rotation    Hip external rotation    Knee flexion 5 5  Knee extension 5 5  Ankle dorsiflexion    Ankle plantarflexion    Ankle inversion    Ankle eversion     (Blank rows = not tested)  LOWER EXTREMITY SPECIAL TESTS:  Hip special tests: Luisa Hart (FABER) test: negative, Trendelenburg test: negative, and Thomas test: negative  FUNCTIONAL TESTS:  5 times sit to stand: 10. 9 sec  2 minute walk test: 449 feet, pain L side 2/10.    GAIT: Distance walked: 449 feet , pain increased  Assistive device utilized: None Level of assistance: Modified independence Comments: increased L sided back and L ASIS /hip pain with this   TODAY'S TREATMENT:                                                                                                                              DATE: 09/06/22 PT eval and assessment, differential  diagnosis     PATIENT EDUCATION:  Education details: see above Person educated: Patient Education method: Explanation, Demonstration, and Handouts Education comprehension: verbalized understanding  HOME EXERCISE PROGRAM: Has from previous PT, will do next visit  ASSESSMENT:  CLINICAL IMPRESSION: Patient is a 72 y.o. male  who was seen today for physical therapy evaluation and treatment for bilateral hip pain. Pain and symptoms are consistent with lumbar stenosis and pain can be referred to proximal hip in the upper lumbar spine. Hip strength and ROM is good.  Unable to provoke pain with special testing to hips. Today was the first time he realized pain began on L hip (ASIS) without the Rt one hurting.    OBJECTIVE IMPAIRMENTS: decreased activity tolerance, decreased mobility, difficulty walking, decreased ROM, hypomobility, increased fascial restrictions, impaired flexibility, improper body mechanics, postural dysfunction, and pain.   ACTIVITY LIMITATIONS: standing and locomotion level  PARTICIPATION LIMITATIONS: shopping and community activity  PERSONAL FACTORS: Past/current experiences, Time since onset of injury/illness/exacerbation, and 3+ comorbidities: previous back surgery, Rt knee, Rt hip replacement   are also affecting patient's functional outcome.   REHAB POTENTIAL: Good  CLINICAL DECISION MAKING: Evolving/moderate complexity  EVALUATION COMPLEXITY: Moderate   GOALS: Goals reviewed with patient? Yes  SHORT TERM GOALS: Target date: 09/27/2022   Pt will be I with HEP for core and lifting, hips  Baseline: unknown, does have HEP from previous PT, does occasionally  Goal status: INITIAL  2.  Pt will be able to walk on level surface for 5 min with pain < 5/10 in hips  Baseline: 2 min , increased pain L hip and back (4/10)  Goal  status: INITIAL   LONG TERM GOALS: Target date: 11/01/2022    Pt will improve FOTO score to 61% or better to demo improved functional  mobility  Baseline: 50% Goal status: INITIAL  2.  Pt will be able to walk in the grocery store with overall min increased in hip pain using the cart for 30 min .  Baseline: 15 min pain can be 9/10-10/10 Goal status: INITIAL  3.  Pt will be able to show I with final HEP  Baseline: unknown  Goal status: INITIAL  4.  Pt will be able to show proper squat and lifting to preserve back with functional activities at home  Baseline: needs cues  Goal status: INITIAL  5.  Pt will be able to perform a plank for 30 sec (modified if needed) without increased back pain  Baseline: NT on eval  Goal status: INITIAL   PLAN:  PT FREQUENCY: 2x/week  PT DURATION: 8 weeks 6-8 weeks   PLANNED INTERVENTIONS: Therapeutic exercises, Therapeutic activity, Neuromuscular re-education, Patient/Family education, Self Care, Joint mobilization, Spinal mobilization, Cryotherapy, Moist heat, Manual therapy, and Re-evaluation  PLAN FOR NEXT SESSION: HEP.  Balance screen? Squat/lift/hinge/ plank   Maylene Crocker, PT 09/06/2022, 8:08 AM   Karie Mainland, PT 09/06/22 1:11 PM Phone: [phone] Fax: [phone]

## 2022-09-06 ENCOUNTER — Encounter: Payer: Self-pay | Admitting: Physical Therapy

## 2022-09-06 ENCOUNTER — Ambulatory Visit: Payer: Medicare Other | Attending: Physical Medicine and Rehabilitation | Admitting: Physical Therapy

## 2022-09-06 DIAGNOSIS — M6281 Muscle weakness (generalized): Secondary | ICD-10-CM | POA: Diagnosis present

## 2022-09-06 DIAGNOSIS — M25551 Pain in right hip: Secondary | ICD-10-CM | POA: Diagnosis present

## 2022-09-06 DIAGNOSIS — R262 Difficulty in walking, not elsewhere classified: Secondary | ICD-10-CM | POA: Diagnosis present

## 2022-09-06 DIAGNOSIS — M25552 Pain in left hip: Secondary | ICD-10-CM | POA: Diagnosis present

## 2022-09-06 DIAGNOSIS — M62830 Muscle spasm of back: Secondary | ICD-10-CM | POA: Diagnosis present

## 2022-09-10 ENCOUNTER — Ambulatory Visit: Payer: Medicare Other | Admitting: Physical Therapy

## 2022-09-10 ENCOUNTER — Encounter: Payer: Self-pay | Admitting: Physical Therapy

## 2022-09-10 DIAGNOSIS — R262 Difficulty in walking, not elsewhere classified: Secondary | ICD-10-CM

## 2022-09-10 DIAGNOSIS — M25552 Pain in left hip: Secondary | ICD-10-CM

## 2022-09-10 DIAGNOSIS — M62830 Muscle spasm of back: Secondary | ICD-10-CM

## 2022-09-10 DIAGNOSIS — M25551 Pain in right hip: Secondary | ICD-10-CM

## 2022-09-10 DIAGNOSIS — M6281 Muscle weakness (generalized): Secondary | ICD-10-CM

## 2022-09-10 NOTE — Therapy (Signed)
OUTPATIENT PHYSICAL THERAPY LOWER EXTREMITY EVALUATION   Patient Name: Gerald Carpenter MRN: [id number] DOB:1950/05/10, 72 y.o., male Today's Date: 09/10/2022  END OF SESSION:  PT End of Session - 09/10/22 1154     Visit Number 2    Number of Visits 16    Date for PT Re-Evaluation 11/01/22    Authorization Type MCR/Aetna    PT Start Time 1149    PT Stop Time 1230    PT Time Calculation (min) 41 min    Activity Tolerance Patient tolerated treatment well    Behavior During Therapy WFL for tasks assessed/performed              Past Medical History:  Diagnosis Date   Alcoholism (HCC)    Arthritis    Cataract    Colon polyps    Complication of anesthesia    HLD (hyperlipidemia)    Nephritis    when pt. was 7 yrs. ols   Peripheral vascular disease (HCC)    PONV (postoperative nausea and vomiting)    "Only when I have aortagram on 06/23/20".   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   Substance abuse [facility])    Past Surgical History:  Procedure Laterality Date   ABDOMINAL AORTOGRAM W/LOWER EXTREMITY N/A 06/23/2020   Procedure: ABDOMINAL AORTOGRAM W/LOWER EXTREMITY;  Surgeon: Cephus Shelling, MD;  [facility];  Service: Cardiovascular;  Laterality: N/A;   ANTERIOR LAT LUMBAR FUSION Right 02/05/2019   Procedure: ATTEMPTED ANTERIOR LATERAL INTERBODY FUSION;  Surgeon: Lisbeth Renshaw, MD;  [facility]: [facility];  Service: Neurosurgery;  Laterality: Right;  RIGHT LATERAL TRANSPSOAS DISCECTOMY AND FUSION WITH ROBOTIC ASSISTED PEDICLE SCREW STABILIZATION, LUMBAR 4- LUMBAR 5   CATARACT EXTRACTION W/ INTRAOCULAR LENS  IMPLANT, BILATERAL Bilateral 2013   lens implants for cataracts after Lasik   COLONOSCOPY     ENDOVEIN HARVEST OF GREATER SAPHENOUS VEIN Left 06/29/2020   Procedure: HARVEST OF LEFT GREATER SAPHENOUS VEIN;  Surgeon: Cephus Shelling, MD;  [facility];  Service: Vascular;  Laterality: Left;   FEMORAL-POPLITEAL BYPASS GRAFT Left  06/29/2020   Procedure: BYPASS GRAFT LEFT COMMON FEMORAL TO ABOVE KNEE POPLITEAL ARTERY;  Surgeon: Cephus Shelling, MD;  [facility];  Service: Vascular;  Laterality: Left;   HEMORRHOID SURGERY N/A 04/06/2021   Procedure: HEMORRHOIDECTOMY 2 COLUMN;  Surgeon: Andria Meuse, MD;  Location: [facility];  Service: General;  Laterality: N/A;   KNEE ARTHROSCOPY Right 1980   RECTAL EXAM UNDER ANESTHESIA N/A 04/06/2021   Procedure: ANORECTAL EXAM UNDER ANESTHESIA/ excision of anal polyp;  Surgeon: Andria Meuse, MD;  Location: [facility];  Service: General;  Laterality: N/A;   TONSILLECTOMY     at a very young age, but grew back   TOTAL HIP ARTHROPLASTY Right 05/25/2019   Procedure: RIGHT TOTAL HIP ARTHROPLASTY ANTERIOR APPROACH;  Surgeon: Tarry Kos, MD;  [facility];  Service: Orthopedics;  Laterality: Right;   TOTAL KNEE ARTHROPLASTY Right 09/04/2021   Procedure: RIGHT TOTAL KNEE ARTHROPLASTY;  Surgeon: Tarry Kos, MD;  [facility];  Service: Orthopedics;  Laterality: Right;   Patient Active Problem List   Diagnosis Date Noted   Status post total right knee replacement 09/04/2021   Leukocytosis 10/11/2020   PAD (peripheral artery disease) (HCC) 06/29/2020   Prediabetes 04/22/2020   Chronic kidney disease, stage 3a (HCC) 04/15/2020   Severe tobacco dependence 04/12/2020   Lower urinary tract symptoms (LUTS) 04/12/2020   History  of colon polyps 04/12/2020   Atherosclerosis of native arteries of extremity with intermittent claudication (HCC) 03/01/2020   Pain in both feet 07/31/2019   Status post total replacement of right hip 05/25/2019   Lumbar radiculopathy 02/05/2019    PCP: Loyola Mast MD  REFERRING PROVIDER: Horton Chin, MD  REFERRING DIAG: [phone] (ICD-10-CM) - Bilateral hip pain  THERAPY DIAG:  Difficulty in walking, not elsewhere classified  Pain in right hip  Pain in left hip  Muscle  weakness (generalized)  Muscle spasm of back  Rationale for Evaluation and Treatment: Rehabilitation  ONSET DATE: chronic   SUBJECTIVE:   SUBJECTIVE STATEMENT:  I am really stiff today for some reason.  Pain in Rt ant hip with getting on to the NuStep.   EVAL: Pt with continued hip pain which has been ongoing for > 1 yr.  He has had Rt THA and after hip surgery he was fine. He now has pain in both hips and only when he walks.  He can stand for short periods with min increase, tolerable.  If he is walking around store if he is not leaning on a cart he has to stop and rest for a few moments.  This takes the pain away.  "It feels like my legs want to stop"  PA is considering a L hip injection if things continue like this.  He has no complaints of sensory changes or weakness.  He has always been very active.  He had PT before with min benefit.    PERTINENT HISTORY: Rt THA 2021, R TKA 2023, lumbar fusion (L4-L5) 2020 PAIN:  Are you having pain? Yes: NPRS scale: none/10 Pain location: bilateral anterior hips  Pain description: deep ache  Aggravating factors: walking  Relieving factors: standing , leaning , holding on to something, stopping  Pain goes to 10/10 with walking 10 min   PRECAUTIONS: None  RED FLAGS: None   WEIGHT BEARING RESTRICTIONS: No  FALLS:  Has patient fallen in last 6 months? No  LIVING ENVIRONMENT: Lives with: lives with their spouse Lives in: House/apartment Stairs: Yes: Internal: 12 steps; on right going up Has following equipment at home: None has a cane but does not use it   OCCUPATION: Retired, [employer]   PLOF: Independent  PATIENT GOALS: Patient wants to walk without pain   NEXT MD VISIT: as needed   OBJECTIVE:   DIAGNOSTIC FINDINGS:  08/2022 He does have moderate  degenerative changes to the left hip joint  Rt hip prosthesis in place.  IMPRESSION: Lumbar  1. Status post posterior instrumented fusion and decompression at L4-L5 without  residual spinal canal or neural foraminal stenosis. 2. Adjacent segment disease at L3-L4 with trace retrolisthesis, disc bulge, and bilateral facet arthropathy resulting in mild spinal canal and bilateral subarticular zone without evidence of frank nerve root impingement, and mild left worse than right neural foraminal stenosis. 3. Mild left worse than right subarticular zone narrowing L2-L3.    PATIENT SURVEYS:  FOTO 50%  COGNITION: Overall cognitive status: Within functional limits for tasks assessed     SENSATION: WFL   MUSCLE LENGTH: Hamstrings: tight  Thomas test: neg Quads: tight in prone   POSTURE: increased thoracic kyphosis, flexed trunk , and Low Rt shoulder   PALPATION: TTP  LUMBAR ROM:   Active  A/PROM  eval  Flexion Distal shin Hamstring tightness   Extension 50%  Right lateral flexion 50%  Left lateral flexion 50%  Right rotation 50%  Left  rotation 50%  Stretch, no pain    LOWER EXTREMITY ROM:  Passive ROM Right eval Left eval   Hip flexion WFL, min ant hip pain  WFL   Hip extension     Hip abduction     Hip adduction     Hip internal rotation Min tight WFL   Hip external rotation Min tight  WFL    Knee flexion [facility]   Knee extension [facility]   Ankle dorsiflexion     Ankle plantarflexion     Ankle inversion     Ankle eversion      (Blank rows = not tested)  LOWER EXTREMITY MMT:  MMT Right eval Left eval Rt./Lt.  09/10/22  Hip flexion  4 4   Hip extension   4+/5  Hip abduction   4+/5   Hip adduction     Hip internal rotation     Hip external rotation     Knee flexion 5 5   Knee extension 5 5   Ankle dorsiflexion     Ankle plantarflexion     Ankle inversion     Ankle eversion      (Blank rows = not tested)  LOWER EXTREMITY SPECIAL TESTS:  Hip special tests: Luisa Hart (FABER) test: negative, Trendelenburg test: negative, and Thomas test: negative  FUNCTIONAL TESTS:  5 times sit to stand: 10. 9 sec  2 minute walk test: 449  feet, pain L side 2/10.    GAIT: Distance walked: 449 feet , pain increased  Assistive device utilized: None Level of assistance: Modified independence Comments: increased L sided back and L ASIS /hip pain with this   TODAY'S TREATMENT:                                                                                                                              DATE:   [facility] Adult PT Treatment:                                                DATE: 09/10/22 Therapeutic Exercise: Nustep L5 for 6 min UE and LE  Standing ant hip stretch 2 x 30  Supine knee to chest with hip circles (passive using UEs )  Rt ant hip stretch off table  Seated Hamstring Stretch x 2 , 30 sec  Supine Bridge  x 10, 5 set Supine Lower Trunk Rotation  x 10  Double knee to chest x 30 sec  Quadruped rocking for hip opening to child's pose   Manual Therapy: PROM hips and assessment of lumbar spasm, paraspinals and glutes  PT Eval: 09/06/22 PT eval and assessment, differential diagnosis     PATIENT EDUCATION:  Education details: see above Person educated: Patient Education method: Explanation, Demonstration, and Handouts Education comprehension: verbalized understanding  HOME EXERCISE PROGRAM:  Access  Code: [email or web address] URL: [email or web address] Date: 09/10/2022 Prepared by: Karie Mainland   ASSESSMENT:  CLINICAL IMPRESSION: Patient reported his back feeling more "relaxed" after session.  He tolerated session well, focused on hip and trunk mobility. He was mildly restricted with respect to his R knee flexion.  He has min weakness in glutes/hips.  Anterior hip stretching did not reveal symptomatic pain.  Suspect hip pain is more from the lumbar spine.  Cont POC.   OBJECTIVE IMPAIRMENTS: decreased activity tolerance, decreased mobility, difficulty walking, decreased ROM, hypomobility, increased fascial restrictions, impaired flexibility, improper body mechanics, postural dysfunction, and pain.    ACTIVITY LIMITATIONS: standing and locomotion level  PARTICIPATION LIMITATIONS: shopping and community activity  PERSONAL FACTORS: Past/current experiences, Time since onset of injury/illness/exacerbation, and 3+ comorbidities: previous back surgery, Rt knee, Rt hip replacement   are also affecting patient's functional outcome.   REHAB POTENTIAL: Good  CLINICAL DECISION MAKING: Evolving/moderate complexity  EVALUATION COMPLEXITY: Moderate   GOALS: Goals reviewed with patient? Yes  SHORT TERM GOALS: Target date: 09/27/2022   Pt will be I with HEP for core and lifting, hips  Baseline: unknown, does have HEP from previous PT, does occasionally  Goal status: INITIAL  2.  Pt will be able to walk on level surface for 5 min with pain < 5/10 in hips  Baseline: 2 min , increased pain L hip and back (4/10)  Goal status: INITIAL   LONG TERM GOALS: Target date: 11/01/2022    Pt will improve FOTO score to 61% or better to demo improved functional mobility  Baseline: 50% Goal status: INITIAL  2.  Pt will be able to walk in the grocery store with overall min increased in hip pain using the cart for 30 min .  Baseline: 15 min pain can be 9/10-10/10 Goal status: INITIAL  3.  Pt will be able to show I with final HEP  Baseline: unknown  Goal status: INITIAL  4.  Pt will be able to show proper squat and lifting to preserve back with functional activities at home  Baseline: needs cues  Goal status: INITIAL  5.  Pt will be able to perform a plank for 30 sec (modified if needed) without increased back pain  Baseline: NT on eval  Goal status: INITIAL   PLAN:  PT FREQUENCY: 2x/week  PT DURATION: 8 weeks 6-8 weeks   PLANNED INTERVENTIONS: Therapeutic exercises, Therapeutic activity, Neuromuscular re-education, Patient/Family education, Self Care, Joint mobilization, Spinal mobilization, Cryotherapy, Moist heat, Manual therapy, and Re-evaluation  PLAN FOR NEXT SESSION: check HEP.   Balance screen? Squat/lift/hinge/ plank   Rhealynn Myhre, PT 09/10/2022, 11:55 AM   Karie Mainland, PT 09/10/22 11:55 AM Phone: [phone] Fax: [phone]

## 2022-09-18 NOTE — Therapy (Unsigned)
OUTPATIENT PHYSICAL THERAPY LOWER EXTREMITY TREATMENT   Patient Name: Gerald Carpenter MRN: [id number] DOB:[birth year], 72 y.o., male Today's Date: 09/19/2022  END OF SESSION:  PT End of Session - 09/19/22 1148     Visit Number 3    Number of Visits 16    Date for PT Re-Evaluation 11/01/22    Authorization Type MCR/Aetna    PT Start Time 1145    PT Stop Time 1225    PT Time Calculation (min) 40 min    Activity Tolerance Patient tolerated treatment well    Behavior During Therapy WFL for tasks assessed/performed               Past Medical History:  Diagnosis Date   Alcoholism (HCC)    Arthritis    Cataract    Colon polyps    Complication of anesthesia    HLD (hyperlipidemia)    Nephritis    when pt. was 7 yrs. ols   Peripheral vascular disease (HCC)    PONV (postoperative nausea and vomiting)    "Only when I have aortagram on 06/23/20".   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   Substance abuse [facility])    Past Surgical History:  Procedure Laterality Date   ABDOMINAL AORTOGRAM W/LOWER EXTREMITY N/A 06/23/2020   Procedure: ABDOMINAL AORTOGRAM W/LOWER EXTREMITY;  Surgeon: Cephus Shelling, MD;  [facility];  Service: Cardiovascular;  Laterality: N/A;   ANTERIOR LAT LUMBAR FUSION Right 02/05/2019   Procedure: ATTEMPTED ANTERIOR LATERAL INTERBODY FUSION;  Surgeon: Lisbeth Renshaw, MD;  [facility];  Service: Neurosurgery;  Laterality: Right;  RIGHT LATERAL TRANSPSOAS DISCECTOMY AND FUSION WITH ROBOTIC ASSISTED PEDICLE SCREW STABILIZATION, LUMBAR 4- LUMBAR 5   CATARACT EXTRACTION W/ INTRAOCULAR LENS  IMPLANT, BILATERAL Bilateral 2013   lens implants for cataracts after Lasik   COLONOSCOPY     ENDOVEIN HARVEST OF GREATER SAPHENOUS VEIN Left 06/29/2020   Procedure: HARVEST OF LEFT GREATER SAPHENOUS VEIN;  Surgeon: Cephus Shelling, MD;  [facility];  Service: Vascular;  Laterality: Left;   FEMORAL-POPLITEAL BYPASS GRAFT Left  06/29/2020   Procedure: BYPASS GRAFT LEFT COMMON FEMORAL TO ABOVE KNEE POPLITEAL ARTERY;  Surgeon: Cephus Shelling, MD;  [facility];  Service: Vascular;  Laterality: Left;   HEMORRHOID SURGERY N/A 04/06/2021   Procedure: HEMORRHOIDECTOMY 2 COLUMN;  Surgeon: Andria Meuse, MD;  Location: [facility];  Service: General;  Laterality: N/A;   KNEE ARTHROSCOPY Right 1980   RECTAL EXAM UNDER ANESTHESIA N/A 04/06/2021   Procedure: ANORECTAL EXAM UNDER ANESTHESIA/ excision of anal polyp;  Surgeon: Andria Meuse, MD;  Location: [facility];  Service: General;  Laterality: N/A;   TONSILLECTOMY     at a very young age, but grew back   TOTAL HIP ARTHROPLASTY Right 05/25/2019   Procedure: RIGHT TOTAL HIP ARTHROPLASTY ANTERIOR APPROACH;  Surgeon: Tarry Kos, MD;  [facility];  Service: Orthopedics;  Laterality: Right;   TOTAL KNEE ARTHROPLASTY Right 09/04/2021   Procedure: RIGHT TOTAL KNEE ARTHROPLASTY;  Surgeon: Tarry Kos, MD;  [facility];  Service: Orthopedics;  Laterality: Right;   Patient Active Problem List   Diagnosis Date Noted   Status post total right knee replacement 09/04/2021   Leukocytosis 10/11/2020   PAD (peripheral artery disease) (HCC) 06/29/2020   Prediabetes 04/22/2020   Chronic kidney disease, stage 3a (HCC) 04/15/2020   Severe tobacco dependence 04/12/2020   Lower urinary tract symptoms (LUTS) 04/12/2020  History of colon polyps 04/12/2020   Atherosclerosis of native arteries of extremity with intermittent claudication (HCC) 03/01/2020   Pain in both feet 07/31/2019   Status post total replacement of right hip 05/25/2019   Lumbar radiculopathy 02/05/2019    PCP: Loyola Mast MD  REFERRING PROVIDER: Horton Chin, MD  REFERRING DIAG: [phone] (ICD-10-CM) - Bilateral hip pain  THERAPY DIAG:  Difficulty in walking, not elsewhere classified  Pain in right hip  Pain in left hip  Muscle  weakness (generalized)  Muscle spasm of back  Rationale for Evaluation and Treatment: Rehabilitation  ONSET DATE: chronic   SUBJECTIVE:   SUBJECTIVE STATEMENT: Patient is under a lot of stress with arranging a funeral for next week in [place].  He has had his normal levels of pain .  Not a lot of time to do his exercises.    EVAL: Pt with continued hip pain which has been ongoing for > 1 yr.  He has had Rt THA and after hip surgery he was fine. He now has pain in both hips and only when he walks.  He can stand for short periods with min increase, tolerable.  If he is walking around store if he is not leaning on a cart he has to stop and rest for a few moments.  This takes the pain away.  "It feels like my legs want to stop"  PA is considering a L hip injection if things continue like this.  He has no complaints of sensory changes or weakness.  He has always been very active.  He had PT before with min benefit.    PERTINENT HISTORY: Rt THA 2021, R TKA 2023, lumbar fusion (L4-L5) 2020 PAIN:  Are you having pain? Yes: NPRS scale: none/10 Pain location: bilateral anterior hips  Pain description: deep ache  Aggravating factors: walking  Relieving factors: standing , leaning , holding on to something, stopping  Pain goes to 10/10 with walking 10 min   PRECAUTIONS: None  RED FLAGS: None   WEIGHT BEARING RESTRICTIONS: No  FALLS:  Has patient fallen in last 6 months? No  LIVING ENVIRONMENT: Lives with: lives with their spouse Lives in: House/apartment Stairs: Yes: Internal: 12 steps; on right going up Has following equipment at home: None has a cane but does not use it   OCCUPATION: Retired, [employer]   PLOF: Independent  PATIENT GOALS: Patient wants to walk without pain   NEXT MD VISIT: as needed   OBJECTIVE:   DIAGNOSTIC FINDINGS:  08/2022 He does have moderate  degenerative changes to the left hip joint  Rt hip prosthesis in place.  IMPRESSION: Lumbar  1. Status post  posterior instrumented fusion and decompression at L4-L5 without residual spinal canal or neural foraminal stenosis. 2. Adjacent segment disease at L3-L4 with trace retrolisthesis, disc bulge, and bilateral facet arthropathy resulting in mild spinal canal and bilateral subarticular zone without evidence of frank nerve root impingement, and mild left worse than right neural foraminal stenosis. 3. Mild left worse than right subarticular zone narrowing L2-L3.    PATIENT SURVEYS:  FOTO 50%  COGNITION: Overall cognitive status: Within functional limits for tasks assessed     SENSATION: WFL   MUSCLE LENGTH: Hamstrings: tight  Thomas test: neg Quads: tight in prone   POSTURE: increased thoracic kyphosis, flexed trunk , and Low Rt shoulder   PALPATION: TTP  LUMBAR ROM:   Active  A/PROM  eval  Flexion Distal shin Hamstring tightness   Extension  50%  Right lateral flexion 50%  Left lateral flexion 50%  Right rotation 50%  Left rotation 50%  Stretch, no pain    LOWER EXTREMITY ROM:  Passive ROM Right eval Left eval   Hip flexion WFL, min ant hip pain  WFL   Hip extension     Hip abduction     Hip adduction     Hip internal rotation Min tight WFL   Hip external rotation Min tight  WFL    Knee flexion [facility]   Knee extension [facility]   Ankle dorsiflexion     Ankle plantarflexion     Ankle inversion     Ankle eversion      (Blank rows = not tested)  LOWER EXTREMITY MMT:  MMT Right eval Left eval Rt./Lt.  09/10/22  Hip flexion  4 4   Hip extension   4+/5  Hip abduction   4+/5   Hip adduction     Hip internal rotation     Hip external rotation     Knee flexion 5 5   Knee extension 5 5   Ankle dorsiflexion     Ankle plantarflexion     Ankle inversion     Ankle eversion      (Blank rows = not tested)  LOWER EXTREMITY SPECIAL TESTS:  Hip special tests: Luisa Hart (FABER) test: negative, Trendelenburg test: negative, and Thomas test: negative  FUNCTIONAL  TESTS:  5 times sit to stand: 10. 9 sec  2 minute walk test: 449 feet, pain L side 2/10.    GAIT: Distance walked: 449 feet , pain increased  Assistive device utilized: None Level of assistance: Modified independence Comments: increased L sided back and L ASIS /hip pain with this   TODAY'S TREATMENT:                                                                                                                              DATE:     [facility] Adult PT Treatment:                                                DATE: 09/19/22 Therapeutic Exercise: NuStep L 6 UE and LE for 6 min  Supine knee to chest Supine knee to opp shoulder Supported knee extension/hamstring with towel under thigh x 10 each LE  Lower trunk rotation x 10  Wide knee LTR x 10 (Internal rotation) Glute Bridge with blue band around thighs  Sit to stand 15 lbs x 10  Squat 15 lbs x 10  Dead lift x 15 lbs x 10  Modified hip hinge due to back pain, no wgt hands on thighs. Min increase in back pain .    [facility] Adult PT Treatment:  DATE: 09/10/22 Therapeutic Exercise: Nustep L5 for 6 min UE and LE  Standing ant hip stretch 2 x 30  Supine knee to chest with hip circles (passive using UEs )  Rt ant hip stretch off table  Seated Hamstring Stretch x 2 , 30 sec  Supine Bridge  x 10, 5 set Supine Lower Trunk Rotation  x 10  Double knee to chest x 30 sec  Quadruped rocking for hip opening to child's pose   Manual Therapy: PROM hips and assessment of lumbar spasm, paraspinals and glutes  PT Eval: 09/06/22 PT eval and assessment, differential diagnosis     PATIENT EDUCATION:  Education details: see above Person educated: Patient Education method: Explanation, Demonstration, and Handouts Education comprehension: verbalized understanding  HOME EXERCISE PROGRAM:  Access Code: [email or web address] URL: [email or web address] Date: 09/10/2022 Prepared by: Karie Mainland   ASSESSMENT:  CLINICAL IMPRESSION: Patient continues to experience same symptoms, has had min time to do his home routine due to family commitments. Introduced lifting and hip hinging but with min increase in low back pain .  He needs cues for reducing low lumbar flexion with this, even without weight. He will cont to benefit from skilled PT to work towards goals of standing/walking longer periods of time.   OBJECTIVE IMPAIRMENTS: decreased activity tolerance, decreased mobility, difficulty walking, decreased ROM, hypomobility, increased fascial restrictions, impaired flexibility, improper body mechanics, postural dysfunction, and pain.   ACTIVITY LIMITATIONS: standing and locomotion level  PARTICIPATION LIMITATIONS: shopping and community activity  PERSONAL FACTORS: Past/current experiences, Time since onset of injury/illness/exacerbation, and 3+ comorbidities: previous back surgery, Rt knee, Rt hip replacement   are also affecting patient's functional outcome.   REHAB POTENTIAL: Good  CLINICAL DECISION MAKING: Evolving/moderate complexity  EVALUATION COMPLEXITY: Moderate   GOALS: Goals reviewed with patient? Yes  SHORT TERM GOALS: Target date: 09/27/2022   Pt will be I with HEP for core and lifting, hips  Baseline: unknown, does have HEP from previous PT, does occasionally  Goal status: INITIAL  2.  Pt will be able to walk on level surface for 5 min with pain < 5/10 in hips  Baseline: 2 min , increased pain L hip and back (4/10)  Goal status: INITIAL   LONG TERM GOALS: Target date: 11/01/2022    Pt will improve FOTO score to 61% or better to demo improved functional mobility  Baseline: 50% Goal status: INITIAL  2.  Pt will be able to walk in the grocery store with overall min increased in hip pain using the cart for 30 min .  Baseline: 15 min pain can be 9/10-10/10 Goal status: INITIAL  3.  Pt will be able to show I with final HEP  Baseline: unknown  Goal  status: INITIAL  4.  Pt will be able to show proper squat and lifting to preserve back with functional activities at home  Baseline: needs cues  Goal status: INITIAL  5.  Pt will be able to perform a plank for 30 sec (modified if needed) without increased back pain  Baseline: NT on eval  Goal status: INITIAL   PLAN:  PT FREQUENCY: 2x/week  PT DURATION: 8 weeks 6-8 weeks   PLANNED INTERVENTIONS: Therapeutic exercises, Therapeutic activity, Neuromuscular re-education, Patient/Family education, Self Care, Joint mobilization, Spinal mobilization, Cryotherapy, Moist heat, Manual therapy, and Re-evaluation  PLAN FOR NEXT SESSION: check HEP.  Balance screen? Squat/lift/hinge/ plank   Chalsea Darko, PT 09/19/2022, 12:31 PM   Karie Mainland, PT 09/19/22 12:31 PM Phone: [phone] Fax: [phone]

## 2022-09-19 ENCOUNTER — Ambulatory Visit: Payer: Medicare Other | Admitting: Physical Therapy

## 2022-09-19 ENCOUNTER — Encounter: Payer: Self-pay | Admitting: Physical Therapy

## 2022-09-19 DIAGNOSIS — R262 Difficulty in walking, not elsewhere classified: Secondary | ICD-10-CM | POA: Diagnosis not present

## 2022-09-19 DIAGNOSIS — M62830 Muscle spasm of back: Secondary | ICD-10-CM

## 2022-09-19 DIAGNOSIS — M25552 Pain in left hip: Secondary | ICD-10-CM

## 2022-09-19 DIAGNOSIS — M25551 Pain in right hip: Secondary | ICD-10-CM

## 2022-09-19 DIAGNOSIS — M6281 Muscle weakness (generalized): Secondary | ICD-10-CM

## 2022-09-25 NOTE — Therapy (Unsigned)
OUTPATIENT PHYSICAL THERAPY LOWER EXTREMITY TREATMENT   Patient Name: Gerald Carpenter MRN: [id number] DOB:Mar 11, 1950, 72 y.o., male Today's Date: 09/26/2022  END OF SESSION:  PT End of Session - 09/26/22 0803     Visit Number 4    Number of Visits 16    Date for PT Re-Evaluation 11/01/22    Authorization Type MCR/[employer]    PT Start Time 0801    PT Stop Time 0845    PT Time Calculation (min) 44 min    Activity Tolerance Patient tolerated treatment well    Behavior During Therapy WFL for tasks assessed/performed                Past Medical History:  Diagnosis Date   Alcoholism (HCC)    Arthritis    Cataract    Colon polyps    Complication of anesthesia    HLD (hyperlipidemia)    Nephritis    when pt. was 7 yrs. ols   Peripheral vascular disease (HCC)    PONV (postoperative nausea and vomiting)    "Only when I have aortagram on 06/23/20".   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   Substance abuse [facility])    Past Surgical History:  Procedure Laterality Date   ABDOMINAL AORTOGRAM W/LOWER EXTREMITY N/A 06/23/2020   Procedure: ABDOMINAL AORTOGRAM W/LOWER EXTREMITY;  Surgeon: Cephus Shelling, MD;  Location: [facility];  Service: Cardiovascular;  Laterality: N/A;   ANTERIOR LAT LUMBAR FUSION Right 02/05/2019   Procedure: ATTEMPTED ANTERIOR LATERAL INTERBODY FUSION;  Surgeon: Lisbeth Renshaw, MD;  Location: [facility] OR;  Service: Neurosurgery;  Laterality: Right;  RIGHT LATERAL TRANSPSOAS DISCECTOMY AND FUSION WITH ROBOTIC ASSISTED PEDICLE SCREW STABILIZATION, LUMBAR 4- LUMBAR 5   CATARACT EXTRACTION W/ INTRAOCULAR LENS  IMPLANT, BILATERAL Bilateral 2013   lens implants for cataracts after Lasik   COLONOSCOPY     ENDOVEIN HARVEST OF GREATER SAPHENOUS VEIN Left 06/29/2020   Procedure: HARVEST OF LEFT GREATER SAPHENOUS VEIN;  Surgeon: Cephus Shelling, MD;  Location: [facility];  Service: Vascular;  Laterality: Left;   FEMORAL-POPLITEAL BYPASS GRAFT Left  06/29/2020   Procedure: BYPASS GRAFT LEFT COMMON FEMORAL TO ABOVE KNEE POPLITEAL ARTERY;  Surgeon: Cephus Shelling, MD;  Location: [facility];  Service: Vascular;  Laterality: Left;   HEMORRHOID SURGERY N/A 04/06/2021   Procedure: HEMORRHOIDECTOMY 2 COLUMN;  Surgeon: Andria Meuse, MD;  Location: [facility];  Service: General;  Laterality: N/A;   KNEE ARTHROSCOPY Right 1980   RECTAL EXAM UNDER ANESTHESIA N/A 04/06/2021   Procedure: ANORECTAL EXAM UNDER ANESTHESIA/ excision of anal polyp;  Surgeon: Andria Meuse, MD;  Location: [facility];  Service: General;  Laterality: N/A;   TONSILLECTOMY     at a very young age, but grew back   TOTAL HIP ARTHROPLASTY Right 05/25/2019   Procedure: RIGHT TOTAL HIP ARTHROPLASTY ANTERIOR APPROACH;  Surgeon: Tarry Kos, MD;  Location: [facility];  Service: Orthopedics;  Laterality: Right;   TOTAL KNEE ARTHROPLASTY Right 09/04/2021   Procedure: RIGHT TOTAL KNEE ARTHROPLASTY;  Surgeon: Tarry Kos, MD;  Location: [facility];  Service: Orthopedics;  Laterality: Right;   Patient Active Problem List   Diagnosis Date Noted   Status post total right knee replacement 09/04/2021   Leukocytosis 10/11/2020   PAD (peripheral artery disease) (HCC) 06/29/2020   Prediabetes 04/22/2020   Chronic kidney disease, stage 3a (HCC) 04/15/2020   Severe tobacco dependence 04/12/2020   Lower urinary tract symptoms (LUTS) 04/12/2020  History of colon polyps 04/12/2020   Atherosclerosis of native arteries of extremity with intermittent claudication (HCC) 03/01/2020   Pain in both feet 07/31/2019   Status post total replacement of right hip 05/25/2019   Lumbar radiculopathy 02/05/2019    PCP: Loyola Mast MD  REFERRING PROVIDER: Horton Chin, MD  REFERRING DIAG: [phone] (ICD-10-CM) - Bilateral hip pain  THERAPY DIAG:  Difficulty in walking, not elsewhere classified  Pain in right hip  Pain in left hip  Muscle  weakness (generalized)  Muscle spasm of back  Rationale for Evaluation and Treatment: Rehabilitation  ONSET DATE: chronic   SUBJECTIVE:   SUBJECTIVE STATEMENT: Pt leaves this week for [place].  He feels stiff and sore each morning until he gets going a bit. I want to work on being able to standing/walking.   PERTINENT HISTORY: Rt THA 2021, R TKA 2023, lumbar fusion (L4-L5) 2020 PAIN:  Are you having pain? Yes: NPRS scale: none/10 Pain location: bilateral anterior hips  Pain description: deep ache  Aggravating factors: walking  Relieving factors: standing , leaning , holding on to something, stopping  Pain goes to 10/10 with walking 10 min   PRECAUTIONS: None  RED FLAGS: None   WEIGHT BEARING RESTRICTIONS: No  FALLS:  Has patient fallen in last 6 months? No  LIVING ENVIRONMENT: Lives with: lives with their spouse Lives in: House/apartment Stairs: Yes: Internal: 12 steps; on right going up Has following equipment at home: None has a cane but does not use it   OCCUPATION: Retired, [employer]   PLOF: Independent  PATIENT GOALS: Patient wants to walk without pain   NEXT MD VISIT: as needed   OBJECTIVE:   DIAGNOSTIC FINDINGS:  08/2022 He does have moderate  degenerative changes to the left hip joint  Rt hip prosthesis in place.  IMPRESSION: Lumbar  1. Status post posterior instrumented fusion and decompression at L4-L5 without residual spinal canal or neural foraminal stenosis. 2. Adjacent segment disease at L3-L4 with trace retrolisthesis, disc bulge, and bilateral facet arthropathy resulting in mild spinal canal and bilateral subarticular zone without evidence of frank nerve root impingement, and mild left worse than right neural foraminal stenosis. 3. Mild left worse than right subarticular zone narrowing L2-L3.    PATIENT SURVEYS:  FOTO 50%  COGNITION: Overall cognitive status: Within functional limits for tasks assessed     SENSATION: WFL   MUSCLE  LENGTH: Hamstrings: tight  Thomas test: neg Quads: tight in prone   POSTURE: increased thoracic kyphosis, flexed trunk , and Low Rt shoulder   PALPATION: TTP  LUMBAR ROM:   Active  A/PROM  eval  Flexion Distal shin Hamstring tightness   Extension 50%  Right lateral flexion 50%  Left lateral flexion 50%  Right rotation 50%  Left rotation 50%  Stretch, no pain    LOWER EXTREMITY ROM:  Passive ROM Right eval Left eval   Hip flexion WFL, min ant hip pain  WFL   Hip extension     Hip abduction     Hip adduction     Hip internal rotation Min tight WFL   Hip external rotation Min tight  WFL    Knee flexion [facility] WFL   Knee extension [facility]   Ankle dorsiflexion     Ankle plantarflexion     Ankle inversion     Ankle eversion      (Blank rows = not tested)  LOWER EXTREMITY MMT:  MMT Right eval Left eval Rt./Lt.  09/10/22  Hip flexion  4 4   Hip extension   4+/5  Hip abduction   4+/5   Hip adduction     Hip internal rotation     Hip external rotation     Knee flexion 5 5   Knee extension 5 5   Ankle dorsiflexion     Ankle plantarflexion     Ankle inversion     Ankle eversion      (Blank rows = not tested)  LOWER EXTREMITY SPECIAL TESTS:  Hip special tests: Luisa Hart (FABER) test: negative, Trendelenburg test: negative, and Thomas test: negative  FUNCTIONAL TESTS:  5 times sit to stand: 10. 9 sec  2 minute walk test: 449 feet, pain L side 2/10.    GAIT: Distance walked: 449 feet , pain increased  Assistive device utilized: None Level of assistance: Modified independence Comments: increased L sided back and L ASIS /hip pain with this   TODAY'S TREATMENT:                                                                                                                              DATE:    [facility] Adult PT Treatment:                                                DATE: 09/26/22 Therapeutic Exercise: NuStep LE and UE for 6 min, L 5 Bridges x 15  LTR x 10 Knee  to chest x 3, then 30 sec to opposite shoulder  Hamstring seated 30 sec x 2  Sit to stand 15 lbs x 10 x 2 Suitcase carry 15 lbs each arm x 180 feet  Standing core tandem, KB pass 30 sec each  Knee extension 25 lbs x 10 x 2  Hamstring 35 lbs 2 x 10  Leg press 2 plates x 15  Self Care: Gym memberships, [employer] and form with gym machines     [facility] Adult PT Treatment:                                                DATE: 09/19/22 Therapeutic Exercise: NuStep L 6 UE and LE for 6 min  Supine knee to chest Supine knee to opp shoulder Supported knee extension/hamstring with towel under thigh x 10 each LE  Lower trunk rotation x 10  Wide knee LTR x 10 (Internal rotation) Glute Bridge with blue band around thighs  Sit to stand 15 lbs x 10  Squat 15 lbs x 10  Dead lift x 15 lbs x 10  Modified hip hinge due to back pain, no wgt hands on thighs. Min increase in back pain .  [facility] Adult PT Treatment:                                                DATE: 09/10/22 Therapeutic Exercise: Nustep L5 for 6 min UE and LE  Standing ant hip stretch 2 x 30  Supine knee to chest with hip circles (passive using UEs )  Rt ant hip stretch off table  Seated Hamstring Stretch x 2 , 30 sec  Supine Bridge  x 10, 5 set Supine Lower Trunk Rotation  x 10  Double knee to chest x 30 sec  Quadruped rocking for hip opening to child's pose   Manual Therapy: PROM hips and assessment of lumbar spasm, paraspinals and glutes  PT Eval: 09/06/22 PT eval and assessment, differential diagnosis     PATIENT EDUCATION:  Education details: see above Person educated: Patient Education method: Explanation, Demonstration, and Handouts Education comprehension: verbalized understanding  HOME EXERCISE PROGRAM:  Access Code: [email or web address] URL: [email or web address] Date: 09/10/2022 Prepared by: Karie Mainland   ASSESSMENT:  CLINICAL IMPRESSION: Patient tolerated session well with increased popping and  stiffness in his back. Exercises modified and focus was shifted to more core stability as he performs the exercises.  He would like to be able to get back into the gym so we incorporated exercise machines discussed membership options for him.  Continue plan of care.    OBJECTIVE IMPAIRMENTS: decreased activity tolerance, decreased mobility, difficulty walking, decreased ROM, hypomobility, increased fascial restrictions, impaired flexibility, improper body mechanics, postural dysfunction, and pain.   ACTIVITY LIMITATIONS: standing and locomotion level  PARTICIPATION LIMITATIONS: shopping and community activity  PERSONAL FACTORS: Past/current experiences, Time since onset of injury/illness/exacerbation, and 3+ comorbidities: previous back surgery, Rt knee, Rt hip replacement   are also affecting patient's functional outcome.   REHAB POTENTIAL: Good  CLINICAL DECISION MAKING: Evolving/moderate complexity  EVALUATION COMPLEXITY: Moderate   GOALS: Goals reviewed with patient? Yes  SHORT TERM GOALS: Target date: 09/27/2022   Pt will be I with HEP for core and lifting, hips  Baseline: unknown, does have HEP from previous PT, does occasionally  Goal status: Met   2.  Pt will be able to walk on level surface for 5 min with pain < 5/10 in hips  Baseline: 2 min , increased pain L hip and back (4/10)  Goal status: INITIAL   LONG TERM GOALS: Target date: 11/01/2022    Pt will improve FOTO score to 61% or better to demo improved functional mobility  Baseline: 50% Goal status: INITIAL  2.  Pt will be able to walk in the grocery store with overall min increased in hip pain using the cart for 30 min .  Baseline: 15 min pain can be 9/10-10/10 Goal status: INITIAL  3.  Pt will be able to show I with final HEP  Baseline: unknown  Goal status: INITIAL  4.  Pt will be able to show proper squat and lifting to preserve back with functional activities at home  Baseline: needs cues  Goal status:  INITIAL  5.  Pt will be able to perform a plank for 30 sec (modified if needed) without increased back pain  Baseline: NT on eval  Goal status: INITIAL   PLAN:  PT FREQUENCY: 2x/week  PT DURATION: 8 weeks 6-8 weeks   PLANNED INTERVENTIONS: Therapeutic exercises, Therapeutic activity, Neuromuscular re-education, Patient/Family education, Self  Care, Joint mobilization, Spinal mobilization, Cryotherapy, Moist heat, Manual therapy, and Re-evaluation  PLAN FOR NEXT SESSION: check HEP.  Balance screen? Squat/lift/hinge/ plank   ,, PT 09/26/2022, 8:05 AM   Karie Mainland, PT 09/26/22 8:05 AM Phone: [phone] Fax: [phone]

## 2022-09-26 ENCOUNTER — Ambulatory Visit: Payer: Medicare Other | Attending: Physical Medicine and Rehabilitation | Admitting: Physical Therapy

## 2022-09-26 ENCOUNTER — Encounter: Payer: Self-pay | Admitting: Physical Therapy

## 2022-09-26 DIAGNOSIS — M25552 Pain in left hip: Secondary | ICD-10-CM | POA: Insufficient documentation

## 2022-09-26 DIAGNOSIS — R262 Difficulty in walking, not elsewhere classified: Secondary | ICD-10-CM | POA: Diagnosis present

## 2022-09-26 DIAGNOSIS — M62830 Muscle spasm of back: Secondary | ICD-10-CM | POA: Diagnosis present

## 2022-09-26 DIAGNOSIS — M6281 Muscle weakness (generalized): Secondary | ICD-10-CM | POA: Insufficient documentation

## 2022-09-26 DIAGNOSIS — M25551 Pain in right hip: Secondary | ICD-10-CM | POA: Diagnosis present

## 2022-09-30 ENCOUNTER — Other Ambulatory Visit: Payer: Self-pay | Admitting: Family Medicine

## 2022-09-30 DIAGNOSIS — I739 Peripheral vascular disease, unspecified: Secondary | ICD-10-CM

## 2022-10-02 NOTE — Therapy (Unsigned)
OUTPATIENT PHYSICAL THERAPY LOWER EXTREMITY TREATMENT   Patient Name: Gerald Carpenter MRN: [id number] DOB:12-27-1950, 72 y.o., male Today's Date: 10/03/2022  END OF SESSION:  PT End of Session - 10/03/22 0749     Visit Number 5    Number of Visits 16    Date for PT Re-Evaluation 11/01/22    Authorization Type MCR/Aet[employer]    PT Start Time 0800    PT Stop Time 0845    PT Time Calculation (min) 45 min    Activity Tolerance Patient tolerated treatment well    Behavior During Therapy WFL for tasks assessed/performed                 Past Medical History:  Diagnosis Date   Alcoholism (HCC)    Arthritis    Cataract    Colon polyps    Complication of anesthesia    HLD (hyperlipidemia)    Nephritis    when pt. was 7 yrs. ols   Peripheral vascular disease (HCC)    PONV (postoperative nausea and vomiting)    "Only when I have aortagram on 06/23/20".   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   Substance abuse [facility])    Past Surgical History:  Procedure Laterality Date   ABDOMINAL AORTOGRAM W/LOWER EXTREMITY N/A 06/23/2020   Procedure: ABDOMINAL AORTOGRAM W/LOWER EXTREMITY;  Surgeon: Cephus Shelling, MD;  Location: [facility];  Service: Cardiovascular;  Laterality: N/A;   ANTERIOR LAT LUMBAR FUSION Right 02/05/2019   Procedure: ATTEMPTED ANTERIOR LATERAL INTERBODY FUSION;  Surgeon: Lisbeth Renshaw, MD;  Location: [facility];  Service: Neurosurgery;  Laterality: Right;  RIGHT LATERAL TRANSPSOAS DISCECTOMY AND FUSION WITH ROBOTIC ASSISTED PEDICLE SCREW STABILIZATION, LUMBAR 4- LUMBAR 5   CATARACT EXTRACTION W/ INTRAOCULAR LENS  IMPLANT, BILATERAL Bilateral 2013   lens implants for cataracts after Lasik   COLONOSCOPY     ENDOVEIN HARVEST OF GREATER SAPHENOUS VEIN Left 06/29/2020   Procedure: HARVEST OF LEFT GREATER SAPHENOUS VEIN;  Surgeon: Cephus Shelling, MD;  Location: [facility];  Service: Vascular;  Laterality: Left;   FEMORAL-POPLITEAL BYPASS GRAFT Left  06/29/2020   Procedure: BYPASS GRAFT LEFT COMMON FEMORAL TO ABOVE KNEE POPLITEAL ARTERY;  Surgeon: Cephus Shelling, MD;  Location: [facility];  Service: Vascular;  Laterality: Left;   HEMORRHOID SURGERY N/A 04/06/2021   Procedure: HEMORRHOIDECTOMY 2 COLUMN;  Surgeon: Andria Meuse, MD;  Location: [facility];  Service: General;  Laterality: N/A;   KNEE ARTHROSCOPY Right 1980   RECTAL EXAM UNDER ANESTHESIA N/A 04/06/2021   Procedure: ANORECTAL EXAM UNDER ANESTHESIA/ excision of anal polyp;  Surgeon: Andria Meuse, MD;  Location: [facility];  Service: General;  Laterality: N/A;   TONSILLECTOMY     at a very young age, but grew back   TOTAL HIP ARTHROPLASTY Right 05/25/2019   Procedure: RIGHT TOTAL HIP ARTHROPLASTY ANTERIOR APPROACH;  Surgeon: Tarry Kos, MD;  Location: [facility];  Service: Orthopedics;  Laterality: Right;   TOTAL KNEE ARTHROPLASTY Right 09/04/2021   Procedure: RIGHT TOTAL KNEE ARTHROPLASTY;  Surgeon: Tarry Kos, MD;  Location: [facility];  Service: Orthopedics;  Laterality: Right;   Patient Active Problem List   Diagnosis Date Noted   Status post total right knee replacement 09/04/2021   Leukocytosis 10/11/2020   PAD (peripheral artery disease) (HCC) 06/29/2020   Prediabetes 04/22/2020   Chronic kidney disease, stage 3a (HCC) 04/15/2020   Severe tobacco dependence 04/12/2020   Lower urinary tract symptoms (LUTS) 04/12/2020  History of colon polyps 04/12/2020   Atherosclerosis of native arteries of extremity with intermittent claudication (HCC) 03/01/2020   Pain in both feet 07/31/2019   Status post total replacement of right hip 05/25/2019   Lumbar radiculopathy 02/05/2019    PCP: Loyola Mast MD  REFERRING PROVIDER: Horton Chin, MD  REFERRING DIAG: [phone] (ICD-10-CM) - Bilateral hip pain  THERAPY DIAG:  Difficulty in walking, not elsewhere classified  Pain in right hip  Pain in left hip  Muscle  weakness (generalized)  Muscle spasm of back  Rationale for Evaluation and Treatment: Rehabilitation  ONSET DATE: chronic   SUBJECTIVE:   SUBJECTIVE STATEMENT: Pt had a difficult time with the trip.  Family stress.   He had some bad pain as he had to do a lot of walking.  He says he was able to walk a little further before the pain hit though.   PERTINENT HISTORY: Rt THA 2021, R TKA 2023, lumbar fusion (L4-L5) 2020 PAIN:  Are you having pain? Yes: NPRS scale: none/10 Pain location: bilateral anterior hips  Pain description: deep ache  Aggravating factors: walking  Relieving factors: standing , leaning , holding on to something, stopping  Pain goes to 10/10 with walking 10 min    Back is tender this AM   PRECAUTIONS: None  RED FLAGS: None   WEIGHT BEARING RESTRICTIONS: No  FALLS:  Has patient fallen in last 6 months? No  LIVING ENVIRONMENT: Lives with: lives with their spouse Lives in: House/apartment Stairs: Yes: Internal: 12 steps; on right going up Has following equipment at home: None has a cane but does not use it   OCCUPATION: Retired, [employer] industrial   PLOF: Independent  PATIENT GOALS: Patient wants to walk without pain   NEXT MD VISIT: as needed   OBJECTIVE:   DIAGNOSTIC FINDINGS:  08/2022 He does have moderate  degenerative changes to the left hip joint  Rt hip prosthesis in place.  IMPRESSION: Lumbar  1. Status post posterior instrumented fusion and decompression at L4-L5 without residual spinal canal or neural foraminal stenosis. 2. Adjacent segment disease at L3-L4 with trace retrolisthesis, disc bulge, and bilateral facet arthropathy resulting in mild spinal canal and bilateral subarticular zone without evidence of frank nerve root impingement, and mild left worse than right neural foraminal stenosis. 3. Mild left worse than right subarticular zone narrowing L2-L3.    PATIENT SURVEYS:  FOTO 50%  COGNITION: Overall cognitive status:  Within functional limits for tasks assessed     SENSATION: WFL   MUSCLE LENGTH: Hamstrings: tight  Thomas test: neg Quads: tight in prone   POSTURE: increased thoracic kyphosis, flexed trunk , and Low Rt shoulder   PALPATION: TTP  LUMBAR ROM:   Active  A/PROM  eval  Flexion Distal shin Hamstring tightness   Extension 50%  Right lateral flexion 50%  Left lateral flexion 50%  Right rotation 50%  Left rotation 50%  Stretch, no pain    LOWER EXTREMITY ROM:  Passive ROM Right eval Left eval   Hip flexion WFL, min ant hip pain  WFL   Hip extension     Hip abduction     Hip adduction     Hip internal rotation Min tight WFL   Hip external rotation Min tight  WFL    Knee flexion [facility]   Knee extension [facility]   Ankle dorsiflexion     Ankle plantarflexion     Ankle inversion     Ankle eversion      (  Blank rows = not tested)  LOWER EXTREMITY MMT:  MMT Right eval Left eval Rt./Lt.  09/10/22  Hip flexion  4 4   Hip extension   4+/5  Hip abduction   4+/5   Hip adduction     Hip internal rotation     Hip external rotation     Knee flexion 5 5   Knee extension 5 5   Ankle dorsiflexion     Ankle plantarflexion     Ankle inversion     Ankle eversion      (Blank rows = not tested)  LOWER EXTREMITY SPECIAL TESTS:  Hip special tests: Luisa Hart (FABER) test: negative, Trendelenburg test: negative, and Thomas test: negative  FUNCTIONAL TESTS:  5 times sit to stand: 10. 9 sec  2 minute walk test: 449 feet, pain L side 2/10.    GAIT: Distance walked: 449 feet , pain increased   Assistive device utilized: None Level of assistance: Modified independence Comments: increased L sided back and L ASIS /hip pain with this   TODAY'S TREATMENT:                                                                                                                               [facility] Adult PT Treatment:                                                DATE: 10/03/22 Therapeutic  Exercise: NuStep L5 UE and LE for 6 min  Knee to chest  Lower trunk rotation arms out x 10 Hamstring stretch Supine physioball : hamstring curls, bridge x 15  Core press x 5 SLR x 10 with core focus  Supine arm arcs x 10 with alt knee lift green band  Standing hip flexor stretch x 3 each side  Standing adductor stretch x 3      [facility] Adult PT Treatment:                                                DATE: 09/26/22 Therapeutic Exercise: NuStep LE and UE for 6 min, L 5 Bridges x 15  LTR x 10 Knee to chest x 3, then 30 sec to opposite shoulder  Hamstring seated 30 sec x 2  Sit to stand 15 lbs x 10 x 2 Suitcase carry 15 lbs each arm x 180 feet  Standing core tandem, KB pass 30 sec each  Knee extension 25 lbs x 10 x 2  Hamstring 35 lbs 2 x 10  Leg press 2 plates x 15  Self Care: Gym memberships, [employer] and form with gym machines     [facility] Adult PT Treatment:  DATE: 09/19/22 Therapeutic Exercise: NuStep L 6 UE and LE for 6 min  Supine knee to chest Supine knee to opp shoulder Supported knee extension/hamstring with towel under thigh x 10 each LE  Lower trunk rotation x 10  Wide knee LTR x 10 (Internal rotation) Glute Bridge with blue band around thighs  Sit to stand 15 lbs x 10  Squat 15 lbs x 10  Dead lift x 15 lbs x 10  Modified hip hinge due to back pain, no wgt hands on thighs. Min increase in back pain .    [facility] Adult PT Treatment:                                                DATE: 09/10/22 Therapeutic Exercise: Nustep L5 for 6 min UE and LE  Standing ant hip stretch 2 x 30  Supine knee to chest with hip circles (passive using UEs )  Rt ant hip stretch off table  Seated Hamstring Stretch x 2 , 30 sec  Supine Bridge  x 10, 5 set Supine Lower Trunk Rotation  x 10  Double knee to chest x 30 sec  Quadruped rocking for hip opening to child's pose   Manual Therapy: PROM hips and assessment of lumbar spasm,  paraspinals and glutes  PT Eval: 09/06/22 PT eval and assessment, differential diagnosis     PATIENT EDUCATION:  Education details: see above Person educated: Patient Education method: Explanation, Demonstration, and Handouts Education comprehension: verbalized understanding  HOME EXERCISE PROGRAM:  Access Code: [email or web address] URL: [email or web address] Date: 10/03/2022 Prepared by: Karie Mainland  Exercises - Seated Hamstring Stretch  - 1-2 x daily - 7 x weekly - 1 sets - 3 reps - 30 hold - Supine Lower Trunk Rotation  - 2 x daily - 7 x weekly - 2 sets - 10 reps - Supine Bridge  - 2 x daily - 7 x weekly - 2 sets - 10 reps - Hooklying Single Knee to Chest Stretch  - 1 x daily - 7 x weekly - 1 sets - 3 reps - 30 hold - Cat to Child's Pose with Posterior Pelvic Tilt  - 1 x daily - 7 x weekly - 1 sets - 5-10 reps - 10-30 hold - Supine ASLR with Ab Bracing and Shoulder Extension with Anchored Resistance  - 1 x daily - 7 x weekly - 2 sets - 10 reps - 5 hold  ASSESSMENT:  CLINICAL IMPRESSION: Patient was out of town for the weekend and reports a heavy amount of stress and pain, limited time spent stretching.  He was able to report improvement in ability to walk and stand while on his trip as well as yesterday.  Added flexion based core to HEP.  Ahd some Rt hip spasms during early reps of SLR and hip flexion which is used to . Cont POC.    OBJECTIVE IMPAIRMENTS: decreased activity tolerance, decreased mobility, difficulty walking, decreased ROM, hypomobility, increased fascial restrictions, impaired flexibility, improper body mechanics, postural dysfunction, and pain.   ACTIVITY LIMITATIONS: standing and locomotion level  PARTICIPATION LIMITATIONS: shopping and community activity  PERSONAL FACTORS: Past/current experiences, Time since onset of injury/illness/exacerbation, and 3+ comorbidities: previous back surgery, Rt knee, Rt hip replacement   are also affecting patient's  functional outcome.   REHAB POTENTIAL: Good  CLINICAL DECISION MAKING: Evolving/moderate complexity  EVALUATION COMPLEXITY:  Moderate   GOALS: Goals reviewed with patient? Yes  SHORT TERM GOALS: Target date: 09/27/2022   Pt will be I with HEP for core and lifting, hips  Baseline: unknown, does have HEP from previous PT, does occasionally  Goal status: Met   2.  Pt will be able to walk on level surface for 5 min with pain < 5/10 in hips  Baseline: 2 min , increased pain L hip and back (4/10)  Goal status: INITIAL   LONG TERM GOALS: Target date: 11/01/2022    Pt will improve FOTO score to 61% or better to demo improved functional mobility  Baseline: 50% Goal status: INITIAL  2.  Pt will be able to walk in the grocery store with overall min increased in hip pain using the cart for 30 min .  Baseline: 15 min pain can be 9/10-10/10 Goal status: INITIAL  3.  Pt will be able to show I with final HEP  Baseline: unknown  Goal status: INITIAL  4.  Pt will be able to show proper squat and lifting to preserve back with functional activities at home  Baseline: needs cues  Goal status: INITIAL  5.  Pt will be able to perform a plank for 30 sec (modified if needed) without increased back pain  Baseline: NT on eval  Goal status: INITIAL   PLAN:  PT FREQUENCY: 2x/week  PT DURATION: 8 weeks 6-8 weeks   PLANNED INTERVENTIONS: Therapeutic exercises, Therapeutic activity, Neuromuscular re-education, Patient/Family education, Self Care, Joint mobilization, Spinal mobilization, Cryotherapy, Moist heat, Manual therapy, and Re-evaluation  PLAN FOR NEXT SESSION: flexion  based core. FOTO , Balance screen? Squat/lift/hinge/ plank   ,, PT 10/03/2022, 8:47 AM   Karie Mainland, PT 10/03/22 8:47 AM Phone: [phone] Fax: [phone]

## 2022-10-03 ENCOUNTER — Ambulatory Visit: Payer: Medicare Other | Admitting: Physical Therapy

## 2022-10-03 ENCOUNTER — Encounter: Payer: Self-pay | Admitting: Physical Therapy

## 2022-10-03 DIAGNOSIS — M25551 Pain in right hip: Secondary | ICD-10-CM

## 2022-10-03 DIAGNOSIS — R262 Difficulty in walking, not elsewhere classified: Secondary | ICD-10-CM | POA: Diagnosis not present

## 2022-10-03 DIAGNOSIS — M25552 Pain in left hip: Secondary | ICD-10-CM

## 2022-10-03 DIAGNOSIS — M6281 Muscle weakness (generalized): Secondary | ICD-10-CM

## 2022-10-03 DIAGNOSIS — M62830 Muscle spasm of back: Secondary | ICD-10-CM

## 2022-10-04 NOTE — Therapy (Signed)
OUTPATIENT PHYSICAL THERAPY LOWER EXTREMITY TREATMENT   Patient Name: Gerald Carpenter MRN: [id number] DOB:1950/03/06, 72 y.o., male Today's Date: 10/05/2022  END OF SESSION:  PT End of Session - 10/05/22 0752     Visit Number 6    Number of Visits 16    Date for PT Re-Evaluation 11/01/22    Authorization Type MCR/[employer]    PT Start Time 0750    PT Stop Time 0840    PT Time Calculation (min) 50 min    Activity Tolerance Patient tolerated treatment well    Behavior During Therapy WFL for tasks assessed/performed                  Past Medical History:  Diagnosis Date   Alcoholism (HCC)    Arthritis    Cataract    Colon polyps    Complication of anesthesia    HLD (hyperlipidemia)    Nephritis    when pt. was 7 yrs. ols   Peripheral vascular disease (HCC)    PONV (postoperative nausea and vomiting)    "Only when I have aortagram on 06/23/20".   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   Substance abuse [facility])    Past Surgical History:  Procedure Laterality Date   ABDOMINAL AORTOGRAM W/LOWER EXTREMITY N/A 06/23/2020   Procedure: ABDOMINAL AORTOGRAM W/LOWER EXTREMITY;  Surgeon: Cephus Shelling, MD;  Location: [facility];  Service: Cardiovascular;  Laterality: N/A;   ANTERIOR LAT LUMBAR FUSION Right 02/05/2019   Procedure: ATTEMPTED ANTERIOR LATERAL INTERBODY FUSION;  Surgeon: Lisbeth Renshaw, MD;  Location: [facility];  Service: Neurosurgery;  Laterality: Right;  RIGHT LATERAL TRANSPSOAS DISCECTOMY AND FUSION WITH ROBOTIC ASSISTED PEDICLE SCREW STABILIZATION, LUMBAR 4- LUMBAR 5   CATARACT EXTRACTION W/ INTRAOCULAR LENS  IMPLANT, BILATERAL Bilateral 2013   lens implants for cataracts after Lasik   COLONOSCOPY     ENDOVEIN HARVEST OF GREATER SAPHENOUS VEIN Left 06/29/2020   Procedure: HARVEST OF LEFT GREATER SAPHENOUS VEIN;  Surgeon: Cephus Shelling, MD;  Location: [facility];  Service: Vascular;  Laterality: Left;   FEMORAL-POPLITEAL BYPASS GRAFT Left  06/29/2020   Procedure: BYPASS GRAFT LEFT COMMON FEMORAL TO ABOVE KNEE POPLITEAL ARTERY;  Surgeon: Cephus Shelling, MD;  Location: [facility];  Service: Vascular;  Laterality: Left;   HEMORRHOID SURGERY N/A 04/06/2021   Procedure: HEMORRHOIDECTOMY 2 COLUMN;  Surgeon: Andria Meuse, MD;  Location: [facility];  Service: General;  Laterality: N/A;   KNEE ARTHROSCOPY Right 1980   RECTAL EXAM UNDER ANESTHESIA N/A 04/06/2021   Procedure: ANORECTAL EXAM UNDER ANESTHESIA/ excision of anal polyp;  Surgeon: Andria Meuse, MD;  Location: [facility];  Service: General;  Laterality: N/A;   TONSILLECTOMY     at a very young age, but grew back   TOTAL HIP ARTHROPLASTY Right 05/25/2019   Procedure: RIGHT TOTAL HIP ARTHROPLASTY ANTERIOR APPROACH;  Surgeon: Tarry Kos, MD;  Location: [facility];  Service: Orthopedics;  Laterality: Right;   TOTAL KNEE ARTHROPLASTY Right 09/04/2021   Procedure: RIGHT TOTAL KNEE ARTHROPLASTY;  Surgeon: Tarry Kos, MD;  Location: [facility];  Service: Orthopedics;  Laterality: Right;   Patient Active Problem List   Diagnosis Date Noted   Status post total right knee replacement 09/04/2021   Leukocytosis 10/11/2020   PAD (peripheral artery disease) (HCC) 06/29/2020   Prediabetes 04/22/2020   Chronic kidney disease, stage 3a (HCC) 04/15/2020   Severe tobacco dependence 04/12/2020   Lower urinary tract symptoms (LUTS)  04/12/2020   History of colon polyps 04/12/2020   Atherosclerosis of native arteries of extremity with intermittent claudication (HCC) 03/01/2020   Pain in both feet 07/31/2019   Status post total replacement of right hip 05/25/2019   Lumbar radiculopathy 02/05/2019    PCP: Loyola Mast MD  REFERRING PROVIDER: Horton Chin, MD  REFERRING DIAG: [phone] (ICD-10-CM) - Bilateral hip pain  THERAPY DIAG:  Difficulty in walking, not elsewhere classified  Pain in right hip  Pain in left hip  Muscle  weakness (generalized)  Muscle spasm of back  Rationale for Evaluation and Treatment: Rehabilitation  ONSET DATE: chronic   SUBJECTIVE:   SUBJECTIVE STATEMENT: Patient reports he was able to mow the lawn yesterday and did not have to stop and rest.  Doing pretty good today!    PERTINENT HISTORY: Rt THA 2021, R TKA 2023, lumbar fusion (L4-L5) 2020 PAIN:  Are you having pain? Yes: NPRS scale: none/10 Pain location: bilateral anterior hips  Pain description: deep ache  Aggravating factors: walking  Relieving factors: standing , leaning , holding on to something, stopping  Pain goes to 10/10 with walking 10 min    Back is tender this AM   PRECAUTIONS: None  RED FLAGS: None   WEIGHT BEARING RESTRICTIONS: No  FALLS:  Has patient fallen in last 6 months? No  LIVING ENVIRONMENT: Lives with: lives with their spouse Lives in: House/apartment Stairs: Yes: Internal: 12 steps; on right going up Has following equipment at home: None has a cane but does not use it   OCCUPATION: Retired, [employer]   PLOF: Independent  PATIENT GOALS: Patient wants to walk without pain   NEXT MD VISIT: as needed   OBJECTIVE:   DIAGNOSTIC FINDINGS:  08/2022 He does have moderate  degenerative changes to the left hip joint  Rt hip prosthesis in place.  IMPRESSION: Lumbar  1. Status post posterior instrumented fusion and decompression at L4-L5 without residual spinal canal or neural foraminal stenosis. 2. Adjacent segment disease at L3-L4 with trace retrolisthesis, disc bulge, and bilateral facet arthropathy resulting in mild spinal canal and bilateral subarticular zone without evidence of frank nerve root impingement, and mild left worse than right neural foraminal stenosis. 3. Mild left worse than right subarticular zone narrowing L2-L3.    PATIENT SURVEYS:  FOTO 50%  COGNITION: Overall cognitive status: Within functional limits for tasks  assessed     SENSATION: WFL   MUSCLE LENGTH: Hamstrings: tight  Thomas test: neg Quads: tight in prone   POSTURE: increased thoracic kyphosis, flexed trunk , and Low Rt shoulder   PALPATION: TTP  LUMBAR ROM:   Active  A/PROM  eval  Flexion Distal shin Hamstring tightness   Extension 50%  Right lateral flexion 50%  Left lateral flexion 50%  Right rotation 50%  Left rotation 50%  Stretch, no pain    LOWER EXTREMITY ROM:  Passive ROM Right eval Left eval   Hip flexion WFL, min ant hip pain  WFL   Hip extension     Hip abduction     Hip adduction     Hip internal rotation Min tight WFL   Hip external rotation Min tight  WFL    Knee flexion [facility] WFL   Knee extension [facility]   Ankle dorsiflexion     Ankle plantarflexion     Ankle inversion     Ankle eversion      (Blank rows = not tested)  LOWER EXTREMITY MMT:  MMT  Right eval Left eval Rt./Lt.  09/10/22  Hip flexion  4 4   Hip extension   4+/5  Hip abduction   4+/5   Hip adduction     Hip internal rotation     Hip external rotation     Knee flexion 5 5   Knee extension 5 5   Ankle dorsiflexion     Ankle plantarflexion     Ankle inversion     Ankle eversion      (Blank rows = not tested)  LOWER EXTREMITY SPECIAL TESTS:  Hip special tests: Luisa Hart (FABER) test: negative, Trendelenburg test: negative, and Thomas test: negative  FUNCTIONAL TESTS:  5 times sit to stand: 10. 9 sec  2 minute walk test: 449 feet, pain L side 2/10.    GAIT: Distance walked: 449 feet , pain increased   Assistive device utilized: None Level of assistance: Modified independence Comments: increased L sided back and L ASIS /hip pain with this   TODAY'S TREATMENT:                                                                                                                               [facility] Adult PT Treatment:                                                DATE: 10/05/22 Therapeutic Exercise: Recumbent bike L2 for 6  min Sit to stand 15 lbs x 15  Squat x 15 lbs x 15  2 min walk to assess pain, pain began in L post thigh, minimal (2/10) Step ups 6 inch x 15 , 2 sets with and without weight (15 lbs)  Calf stretch at the wall  Supine hamstring/ITB stretch x 3 with sheet Knees crossed with LTR x 5   Manual Therapy: Long axis distraction x 3 to each leg, 30 seconds each at varying angles [facility] Adult PT Treatment:                                                DATE: 10/03/22 Therapeutic Exercise: NuStep L5 UE and LE for 6 min  Knee to chest  Lower trunk rotation arms out x 10 Hamstring stretch Supine physioball : hamstring curls, bridge x 15  Core press x 5 SLR x 10 with core focus  Supine arm arcs x 10 with alt knee lift green band  Standing hip flexor stretch x 3 each side  Standing adductor stretch x 3      [facility] Adult PT Treatment:  DATE: 09/26/22 Therapeutic Exercise: NuStep LE and UE for 6 min, L 5 Bridges x 15  LTR x 10 Knee to chest x 3, then 30 sec to opposite shoulder  Hamstring seated 30 sec x 2  Sit to stand 15 lbs x 10 x 2 Suitcase carry 15 lbs each arm x 180 feet  Standing core tandem, KB pass 30 sec each  Knee extension 25 lbs x 10 x 2  Hamstring 35 lbs 2 x 10  Leg press 2 plates x 15  Self Care: Gym memberships, [employer] and form with gym machines     [facility] Adult PT Treatment:                                                DATE: 09/19/22 Therapeutic Exercise: NuStep L 6 UE and LE for 6 min  Supine knee to chest Supine knee to opp shoulder Supported knee extension/hamstring with towel under thigh x 10 each LE  Lower trunk rotation x 10  Wide knee LTR x 10 (Internal rotation) Glute Bridge with blue band around thighs  Sit to stand 15 lbs x 10  Squat 15 lbs x 10  Dead lift x 15 lbs x 10  Modified hip hinge due to back pain, no wgt hands on thighs. Min increase in back pain .    [facility] Adult PT Treatment:                                                 DATE: 09/10/22 Therapeutic Exercise: Nustep L5 for 6 min UE and LE  Standing ant hip stretch 2 x 30  Supine knee to chest with hip circles (passive using UEs )  Rt ant hip stretch off table  Seated Hamstring Stretch x 2 , 30 sec  Supine Bridge  x 10, 5 set Supine Lower Trunk Rotation  x 10  Double knee to chest x 30 sec  Quadruped rocking for hip opening to child's pose   Manual Therapy: PROM hips and assessment of lumbar spasm, paraspinals and glutes  PT Eval: 09/06/22 PT eval and assessment, differential diagnosis     PATIENT EDUCATION:  Education details: see above Person educated: Patient Education method: Explanation, Demonstration, and Handouts Education comprehension: verbalized understanding  HOME EXERCISE PROGRAM:  Access Code: [email or web address] URL: [email or web address] Date: 10/03/2022 Prepared by: Karie Mainland  Exercises - Seated Hamstring Stretch  - 1-2 x daily - 7 x weekly - 1 sets - 3 reps - 30 hold - Supine Lower Trunk Rotation  - 2 x daily - 7 x weekly - 2 sets - 10 reps - Supine Bridge  - 2 x daily - 7 x weekly - 2 sets - 10 reps - Hooklying Single Knee to Chest Stretch  - 1 x daily - 7 x weekly - 1 sets - 3 reps - 30 hold - Cat to Child's Pose with Posterior Pelvic Tilt  - 1 x daily - 7 x weekly - 1 sets - 5-10 reps - 10-30 hold - Supine ASLR with Ab Bracing and Shoulder Extension with Anchored Resistance  - 1 x daily - 7 x weekly - 2 sets - 10 reps - 5 hold  ASSESSMENT:  CLINICAL IMPRESSION:  Patient shows improvement in ability to do yard work and walk with reported decrease in pain levels he was able to cross his right leg over his left without pain which was different than previous.  Continue to focus on hip strengthening and core strengthening with functional movement.    OBJECTIVE IMPAIRMENTS: decreased activity tolerance, decreased mobility, difficulty walking, decreased ROM, hypomobility, increased fascial  restrictions, impaired flexibility, improper body mechanics, postural dysfunction, and pain.   ACTIVITY LIMITATIONS: standing and locomotion level  PARTICIPATION LIMITATIONS: shopping and community activity  PERSONAL FACTORS: Past/current experiences, Time since onset of injury/illness/exacerbation, and 3+ comorbidities: previous back surgery, Rt knee, Rt hip replacement   are also affecting patient's functional outcome.   REHAB POTENTIAL: Good  CLINICAL DECISION MAKING: Evolving/moderate complexity  EVALUATION COMPLEXITY: Moderate   GOALS: Goals reviewed with patient? Yes  SHORT TERM GOALS: Target date: 09/27/2022   Pt will be I with HEP for core and lifting, hips  Baseline: unknown, does have HEP from previous PT, does occasionally  Goal status: Met   2.  Pt will be able to walk on level surface for 5 min with pain < 5/10 in hips  Baseline: 2 min , increased pain L hip and back (4/10)  Goal status:MET   LONG TERM GOALS: Target date: 11/01/2022    Pt will improve FOTO score to 61% or better to demo improved functional mobility  Baseline: 50% Goal status: INITIAL  2.  Pt will be able to walk in the grocery store with overall min increased in hip pain using the cart for 30 min .  Baseline: 15 min pain can be 9/10-10/10 Goal status: INITIAL  3.  Pt will be able to show I with final HEP  Baseline: unknown  Goal status: INITIAL  4.  Pt will be able to show proper squat and lifting to preserve back with functional activities at home  Baseline: needs cues  Goal status: INITIAL  5.  Pt will be able to perform a plank for 30 sec (modified if needed) without increased back pain  Baseline: NT on eval  Goal status: INITIAL   PLAN:  PT FREQUENCY: 2x/week  PT DURATION: 8 weeks 6-8 weeks   PLANNED INTERVENTIONS: Therapeutic exercises, Therapeutic activity, Neuromuscular re-education, Patient/Family education, Self Care, Joint mobilization, Spinal mobilization, Cryotherapy,  Moist heat, Manual therapy, and Re-evaluation  PLAN FOR NEXT SESSION: Check goals, flexion based core.  Recheck FOTO , Balance screen? Squat/lift/hinge/ plank   Laurel Harnden, PT 10/05/2022, 7:53 AM   Karie Mainland, PT 10/05/22 7:53 AM Phone: [phone] Fax: [phone]

## 2022-10-05 ENCOUNTER — Ambulatory Visit: Payer: Medicare Other | Admitting: Physical Therapy

## 2022-10-05 ENCOUNTER — Encounter: Payer: Self-pay | Admitting: Physical Therapy

## 2022-10-05 DIAGNOSIS — R262 Difficulty in walking, not elsewhere classified: Secondary | ICD-10-CM

## 2022-10-05 DIAGNOSIS — M25552 Pain in left hip: Secondary | ICD-10-CM

## 2022-10-05 DIAGNOSIS — M62830 Muscle spasm of back: Secondary | ICD-10-CM

## 2022-10-05 DIAGNOSIS — M6281 Muscle weakness (generalized): Secondary | ICD-10-CM

## 2022-10-05 DIAGNOSIS — M25551 Pain in right hip: Secondary | ICD-10-CM

## 2022-10-09 NOTE — Therapy (Unsigned)
OUTPATIENT PHYSICAL THERAPY LOWER EXTREMITY TREATMENT   Patient Name: Gerald Carpenter MRN: [id number] DOB:[birth year], 72 y.o., male Today's Date: 10/10/2022  END OF SESSION:  PT End of Session - 10/10/22 0745     Visit Number 7    Number of Visits 16    Date for PT Re-Evaluation 11/01/22    Authorization Type MCR/Aetna    PT Start Time 0750    PT Stop Time 0835    PT Time Calculation (min) 45 min    Activity Tolerance Patient tolerated treatment well    Behavior During Therapy [facility] for tasks assessed/performed                   Past Medical History:  Diagnosis Date   Alcoholism (HCC)    Arthritis    Cataract    Colon polyps    Complication of anesthesia    HLD (hyperlipidemia)    Nephritis    when pt. was 7 yrs. ols   Peripheral vascular disease (HCC)    PONV (postoperative nausea and vomiting)    "Only when I have aortagram on 06/23/20".   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   Substance abuse [facility])    Past Surgical History:  Procedure Laterality Date   ABDOMINAL AORTOGRAM W/LOWER EXTREMITY N/A 06/23/2020   Procedure: ABDOMINAL AORTOGRAM W/LOWER EXTREMITY;  Surgeon: Cephus Shelling, MD;  Location: [facility];  Service: Cardiovascular;  Laterality: N/A;   ANTERIOR LAT LUMBAR FUSION Right 02/05/2019   Procedure: ATTEMPTED ANTERIOR LATERAL INTERBODY FUSION;  Surgeon: Lisbeth Renshaw, MD;  Location: [facility] OR;  Service: Neurosurgery;  Laterality: Right;  RIGHT LATERAL TRANSPSOAS DISCECTOMY AND FUSION WITH ROBOTIC ASSISTED PEDICLE SCREW STABILIZATION, LUMBAR 4- LUMBAR 5   CATARACT EXTRACTION W/ INTRAOCULAR LENS  IMPLANT, BILATERAL Bilateral 2013   lens implants for cataracts after Lasik   COLONOSCOPY     ENDOVEIN HARVEST OF GREATER SAPHENOUS VEIN Left 06/29/2020   Procedure: HARVEST OF LEFT GREATER SAPHENOUS VEIN;  Surgeon: Cephus Shelling, MD;  Location: [facility] OR;  Service: Vascular;  Laterality: Left;   FEMORAL-POPLITEAL BYPASS GRAFT Left  06/29/2020   Procedure: BYPASS GRAFT LEFT COMMON FEMORAL TO ABOVE KNEE POPLITEAL ARTERY;  Surgeon: Cephus Shelling, MD;  Location: [facility] OR;  Service: Vascular;  Laterality: Left;   HEMORRHOID SURGERY N/A 04/06/2021   Procedure: HEMORRHOIDECTOMY 2 COLUMN;  Surgeon: Andria Meuse, MD;  Location: [facility];  Service: General;  Laterality: N/A;   KNEE ARTHROSCOPY Right 1980   RECTAL EXAM UNDER ANESTHESIA N/A 04/06/2021   Procedure: ANORECTAL EXAM UNDER ANESTHESIA/ excision of anal polyp;  Surgeon: Andria Meuse, MD;  Location: [facility];  Service: General;  Laterality: N/A;   TONSILLECTOMY     at a very young age, but grew back   TOTAL HIP ARTHROPLASTY Right 05/25/2019   Procedure: RIGHT TOTAL HIP ARTHROPLASTY ANTERIOR APPROACH;  Surgeon: Tarry Kos, MD;  Location: [facility] OR;  Service: Orthopedics;  Laterality: Right;   TOTAL KNEE ARTHROPLASTY Right 09/04/2021   Procedure: RIGHT TOTAL KNEE ARTHROPLASTY;  Surgeon: Tarry Kos, MD;  Location: [facility] OR;  Service: Orthopedics;  Laterality: Right;   Patient Active Problem List   Diagnosis Date Noted   Status post total right knee replacement 09/04/2021   Leukocytosis 10/11/2020   PAD (peripheral artery disease) (HCC) 06/29/2020   Prediabetes 04/22/2020   Chronic kidney disease, stage 3a (HCC) 04/15/2020   Severe tobacco dependence 04/12/2020   Lower urinary tract symptoms (  LUTS) 04/12/2020   History of colon polyps 04/12/2020   Atherosclerosis of native arteries of extremity with intermittent claudication (HCC) 03/01/2020   Pain in both feet 07/31/2019   Status post total replacement of right hip 05/25/2019   Lumbar radiculopathy 02/05/2019    PCP: Loyola Mast MD  REFERRING PROVIDER: Horton Chin, MD  REFERRING DIAG: [phone] (ICD-10-CM) - Bilateral hip pain  THERAPY DIAG:  Difficulty in walking, not elsewhere classified  Pain in right hip  Pain in left hip  Muscle  weakness (generalized)  Muscle spasm of back  Rationale for Evaluation and Treatment: Rehabilitation  ONSET DATE: chronic   SUBJECTIVE:   SUBJECTIVE STATEMENT: Doing OK.  Pain not an issue when I sit.     PERTINENT HISTORY: Rt THA 2021, R TKA 2023, lumbar fusion (L4-L5) 2020 PAIN:  Are you having pain? Yes: NPRS scale: 2-3/10 Pain location: bilateral anterior hips  Pain description: deep ache  Aggravating factors: walking  Relieving factors: standing , leaning , holding on to something, stopping  Pain goes to 10/10 with walking 10 min   Back is tender this AM   PRECAUTIONS: None  RED FLAGS: None   WEIGHT BEARING RESTRICTIONS: No  FALLS:  Has patient fallen in last 6 months? No  LIVING ENVIRONMENT: Lives with: lives with their spouse Lives in: House/apartment Stairs: Yes: Internal: 12 steps; on right going up Has following equipment at home: None has a cane but does not use it   OCCUPATION: Retired, [employer]   PLOF: Independent  PATIENT GOALS: Patient wants to walk without pain   NEXT MD VISIT: as needed   OBJECTIVE:   DIAGNOSTIC FINDINGS:  08/2022 He does have moderate  degenerative changes to the left hip joint  Rt hip prosthesis in place.  IMPRESSION: Lumbar  1. Status post posterior instrumented fusion and decompression at L4-L5 without residual spinal canal or neural foraminal stenosis. 2. Adjacent segment disease at L3-L4 with trace retrolisthesis, disc bulge, and bilateral facet arthropathy resulting in mild spinal canal and bilateral subarticular zone without evidence of frank nerve root impingement, and mild left worse than right neural foraminal stenosis. 3. Mild left worse than right subarticular zone narrowing L2-L3.    PATIENT SURVEYS:  FOTO 50%  COGNITION: Overall cognitive status: Within functional limits for tasks assessed     SENSATION: WFL   MUSCLE LENGTH: Hamstrings: tight  Thomas test: neg Quads: tight in prone    POSTURE: increased thoracic kyphosis, flexed trunk , and Low Rt shoulder   PALPATION: TTP  LUMBAR ROM:   Active  A/PROM  eval  Flexion Distal shin Hamstring tightness   Extension 50%  Right lateral flexion 50%  Left lateral flexion 50%  Right rotation 50%  Left rotation 50%  Stretch, no pain    LOWER EXTREMITY ROM:  Passive ROM Right eval Left eval  Hip flexion WFL, min ant hip pain  WFL  Hip extension    Hip abduction    Hip adduction    Hip internal rotation Min tight WFL  Hip external rotation Min tight  WFL   Knee flexion [facility]  Knee extension [facility]  Ankle dorsiflexion    Ankle plantarflexion    Ankle inversion    Ankle eversion     (Blank rows = not tested)  LOWER EXTREMITY MMT:  MMT Right eval Left eval Rt./Lt.  09/10/22  Hip flexion  4 4   Hip extension   4+/5  Hip abduction  4+/5   Hip adduction     Hip internal rotation     Hip external rotation     Knee flexion 5 5   Knee extension 5 5   Ankle dorsiflexion     Ankle plantarflexion     Ankle inversion     Ankle eversion      (Blank rows = not tested)  LOWER EXTREMITY SPECIAL TESTS:  Hip special tests: Luisa Hart (FABER) test: negative, Trendelenburg test: negative, and Thomas test: negative  FUNCTIONAL TESTS:  5 times sit to stand: 10. 9 sec  2 minute walk test: 449 feet, pain L side 2/10.     GAIT: Distance walked: 449 feet , pain increased   Assistive device utilized: None Level of assistance: Modified independence Comments: increased L sided back and L ASIS /hip pain with this   TODAY'S TREATMENT:                                                                                                                               [facility] Adult PT Treatment:                                                DATE: 10/09/22 Therapeutic Exercise: TM up to 1.6 mph for 5 min , pain increased to 5/10 on L hip and 4/10 Rt hip Lower trunk rotation x 5  Single and then double knee chest  Bridge  with green band x 10 Bridge with hips ER/IR green band x 10  SLR with Pilates circle on opposite thigh x 10  Sidelying green band clam x 10  Standing Hip abduction x 10   Self Care: Tennis ball on the wall for post hip  Dry needling   [facility] Adult PT Treatment:                                                DATE: 10/05/22 Therapeutic Exercise: Recumbent bike L2 for 6 min Sit to stand 15 lbs x 15  Squat x 15 lbs x 15  2 min walk to assess pain, pain began in L post thigh, minimal (2/10) Step ups 6 inch x 15 , 2 sets with and without weight (15 lbs)  Calf stretch at the wall  Supine hamstring/ITB stretch x 3 with sheet Knees crossed with LTR x 5   Manual Therapy: Long axis distraction x 3 to each leg, 30 seconds each at varying angles   [facility] Adult PT Treatment:  DATE: 10/03/22 Therapeutic Exercise: NuStep L5 UE and LE for 6 min  Knee to chest  Lower trunk rotation arms out x 10 Hamstring stretch Supine physioball : hamstring curls, bridge x 15  Core press x 5 SLR x 10 with core focus  Supine arm arcs x 10 with alt knee lift green band  Standing hip flexor stretch x 3 each side  Standing adductor stretch x 3      [facility] Adult PT Treatment:                                                DATE: 09/26/22 Therapeutic Exercise: NuStep LE and UE for 6 min, L 5 Bridges x 15  LTR x 10 Knee to chest x 3, then 30 sec to opposite shoulder  Hamstring seated 30 sec x 2  Sit to stand 15 lbs x 10 x 2 Suitcase carry 15 lbs each arm x 180 feet  Standing core tandem, KB pass 30 sec each  Knee extension 25 lbs x 10 x 2  Hamstring 35 lbs 2 x 10  Leg press 2 plates x 15  Self Care: Gym memberships, [employer] and form with gym machines     [facility] Adult PT Treatment:                                                DATE: 09/19/22 Therapeutic Exercise: NuStep L 6 UE and LE for 6 min  Supine knee to chest Supine knee to opp shoulder Supported  knee extension/hamstring with towel under thigh x 10 each LE  Lower trunk rotation x 10  Wide knee LTR x 10 (Internal rotation) Glute Bridge with blue band around thighs  Sit to stand 15 lbs x 10  Squat 15 lbs x 10  Dead lift x 15 lbs x 10  Modified hip hinge due to back pain, no wgt hands on thighs. Min increase in back pain .    [facility] Adult PT Treatment:                                                DATE: 09/10/22 Therapeutic Exercise: Nustep L5 for 6 min UE and LE  Standing ant hip stretch 2 x 30  Supine knee to chest with hip circles (passive using UEs )  Rt ant hip stretch off table  Seated Hamstring Stretch x 2 , 30 sec  Supine Bridge  x 10, 5 set Supine Lower Trunk Rotation  x 10  Double knee to chest x 30 sec  Quadruped rocking for hip opening to child's pose   Manual Therapy: PROM hips and assessment of lumbar spasm, paraspinals and glutes  PT Eval: 09/06/22 PT eval and assessment, differential diagnosis     PATIENT EDUCATION:  Education details: see above Person educated: Patient Education method: Explanation, Demonstration, and Handouts Education comprehension: verbalized understanding  HOME EXERCISE PROGRAM:  Access Code: [email or web address] URL: [email or web address] Date: 10/03/2022 Prepared by: Karie Mainland  Exercises - Seated Hamstring Stretch  - 1-2 x daily - 7 x weekly - 1 sets - 3 reps -  30 hold - Supine Lower Trunk Rotation  - 2 x daily - 7 x weekly - 2 sets - 10 reps - Supine Bridge  - 2 x daily - 7 x weekly - 2 sets - 10 reps - Hooklying Single Knee to Chest Stretch  - 1 x daily - 7 x weekly - 1 sets - 3 reps - 30 hold - Cat to Child's Pose with Posterior Pelvic Tilt  - 1 x daily - 7 x weekly - 1 sets - 5-10 reps - 10-30 hold - Supine ASLR with Ab Bracing and Shoulder Extension with Anchored Resistance  - 1 x daily - 7 x weekly - 2 sets - 10 reps - 5 hold Added side lying clam  ASSESSMENT:  CLINICAL IMPRESSION: Patient  began to experience  increased hip ain when walking even 5 min on the treadmill, rest/supine takes pain down to 0/10.  Pt notices spasm with first 1-2 reps of an exervise.  Pt asked about dry needling and if he would consider it. He states he hates needles but then reports he has had acupuncture and injections.  Patient will continue to benefit from skilled physical therapy. Offered tennis ball to release piriformis spasm using the wall.   OBJECTIVE IMPAIRMENTS: decreased activity tolerance, decreased mobility, difficulty walking, decreased ROM, hypomobility, increased fascial restrictions, impaired flexibility, improper body mechanics, postural dysfunction, and pain.   ACTIVITY LIMITATIONS: standing and locomotion level  PARTICIPATION LIMITATIONS: shopping and community activity  PERSONAL FACTORS: Past/current experiences, Time since onset of injury/illness/exacerbation, and 3+ comorbidities: previous back surgery, Rt knee, Rt hip replacement   are also affecting patient's functional outcome.   REHAB POTENTIAL: Good  CLINICAL DECISION MAKING: Evolving/moderate complexity  EVALUATION COMPLEXITY: Moderate   GOALS: Goals reviewed with patient? Yes  SHORT TERM GOALS: Target date: 09/27/2022   Pt will be I with HEP for core and lifting, hips  Baseline: unknown, does have HEP from previous PT, does occasionally  Goal status: Met   2.  Pt will be able to walk on level surface for 5 min with pain < 5/10 in hips  Baseline: 2 min , increased pain L hip and back (4/10)  Goal status:MET   LONG TERM GOALS: Target date: 11/01/2022    Pt will improve FOTO score to 61% or better to demo improved functional mobility  Baseline: 50% Goal status: INITIAL  2.  Pt will be able to walk in the grocery store with overall min increased in hip pain using the cart for 30 min .  Baseline: 15 min pain can be 9/10-10/10 Goal status: INITIAL  3.  Pt will be able to show I with final HEP  Baseline: unknown  Goal status:  INITIAL  4.  Pt will be able to show proper squat and lifting to preserve back with functional activities at home  Baseline: needs cues  Goal status: INITIAL  5.  Pt will be able to perform a plank for 30 sec (modified if needed) without increased back pain  Baseline: NT on eval  Goal status: INITIAL   PLAN:  PT FREQUENCY: 2x/week  PT DURATION: 8 weeks 6-8 weeks   PLANNED INTERVENTIONS: Therapeutic exercises, Therapeutic activity, Neuromuscular re-education, Patient/Family education, Self Care, Joint mobilization, Spinal mobilization, Cryotherapy, Moist heat, Manual therapy, and Re-evaluation  PLAN FOR NEXT SESSION:  Recheck FOTO , Balance screen? Squat/lift/hinge/ plank   Sumner Boesch, PT 10/10/2022, 7:46 AM   Karie Mainland, PT 10/10/22 7:46 AM Phone: [phone] Fax: [phone]

## 2022-10-10 ENCOUNTER — Encounter: Payer: Self-pay | Admitting: Physical Therapy

## 2022-10-10 ENCOUNTER — Ambulatory Visit: Payer: Medicare Other | Admitting: Physical Therapy

## 2022-10-10 DIAGNOSIS — M25551 Pain in right hip: Secondary | ICD-10-CM

## 2022-10-10 DIAGNOSIS — M6281 Muscle weakness (generalized): Secondary | ICD-10-CM

## 2022-10-10 DIAGNOSIS — R262 Difficulty in walking, not elsewhere classified: Secondary | ICD-10-CM | POA: Diagnosis not present

## 2022-10-10 DIAGNOSIS — M62830 Muscle spasm of back: Secondary | ICD-10-CM

## 2022-10-10 DIAGNOSIS — M25552 Pain in left hip: Secondary | ICD-10-CM

## 2022-10-11 NOTE — Therapy (Signed)
OUTPATIENT PHYSICAL THERAPY LOWER EXTREMITY TREATMENT   Patient Name: Gerald Carpenter MRN: [id number] DOB:1950/08/22, 72 y.o., male Today's Date: 10/12/2022  END OF SESSION:  PT End of Session - 10/12/22 0934     Visit Number 8    Number of Visits 16    Date for PT Re-Evaluation 11/01/22    Authorization Type MCR/[employer]    PT Start Time 0932    PT Stop Time 1015    PT Time Calculation (min) 43 min    Activity Tolerance Patient tolerated treatment well    Behavior During Therapy WFL for tasks assessed/performed                    Past Medical History:  Diagnosis Date   Alcoholism (HCC)    Arthritis    Cataract    Colon polyps    Complication of anesthesia    HLD (hyperlipidemia)    Nephritis    when pt. was 7 yrs. ols   Peripheral vascular disease (HCC)    PONV (postoperative nausea and vomiting)    "Only when I have aortagram on 06/23/20".   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   Substance abuse [facility])    Past Surgical History:  Procedure Laterality Date   ABDOMINAL AORTOGRAM W/LOWER EXTREMITY N/A 06/23/2020   Procedure: ABDOMINAL AORTOGRAM W/LOWER EXTREMITY;  Surgeon: Cephus Shelling, MD;  Location: [facility];  Service: Cardiovascular;  Laterality: N/A;   ANTERIOR LAT LUMBAR FUSION Right 02/05/2019   Procedure: ATTEMPTED ANTERIOR LATERAL INTERBODY FUSION;  Surgeon: Lisbeth Renshaw, MD;  Location: [facility] OR;  Service: Neurosurgery;  Laterality: Right;  RIGHT LATERAL TRANSPSOAS DISCECTOMY AND FUSION WITH ROBOTIC ASSISTED PEDICLE SCREW STABILIZATION, LUMBAR 4- LUMBAR 5   CATARACT EXTRACTION W/ INTRAOCULAR LENS  IMPLANT, BILATERAL Bilateral 2013   lens implants for cataracts after Lasik   COLONOSCOPY     ENDOVEIN HARVEST OF GREATER SAPHENOUS VEIN Left 06/29/2020   Procedure: HARVEST OF LEFT GREATER SAPHENOUS VEIN;  Surgeon: Cephus Shelling, MD;  Location: [facility];  Service: Vascular;  Laterality: Left;   FEMORAL-POPLITEAL BYPASS GRAFT  Left 06/29/2020   Procedure: BYPASS GRAFT LEFT COMMON FEMORAL TO ABOVE KNEE POPLITEAL ARTERY;  Surgeon: Cephus Shelling, MD;  Location: [facility];  Service: Vascular;  Laterality: Left;   HEMORRHOID SURGERY N/A 04/06/2021   Procedure: HEMORRHOIDECTOMY 2 COLUMN;  Surgeon: Andria Meuse, MD;  Location: [facility];  Service: General;  Laterality: N/A;   KNEE ARTHROSCOPY Right 1980   RECTAL EXAM UNDER ANESTHESIA N/A 04/06/2021   Procedure: ANORECTAL EXAM UNDER ANESTHESIA/ excision of anal polyp;  Surgeon: Andria Meuse, MD;  Location: [facility];  Service: General;  Laterality: N/A;   TONSILLECTOMY     at a very young age, but grew back   TOTAL HIP ARTHROPLASTY Right 05/25/2019   Procedure: RIGHT TOTAL HIP ARTHROPLASTY ANTERIOR APPROACH;  Surgeon: Tarry Kos, MD;  Location: [facility];  Service: Orthopedics;  Laterality: Right;   TOTAL KNEE ARTHROPLASTY Right 09/04/2021   Procedure: RIGHT TOTAL KNEE ARTHROPLASTY;  Surgeon: Tarry Kos, MD;  Location: [facility];  Service: Orthopedics;  Laterality: Right;   Patient Active Problem List   Diagnosis Date Noted   Status post total right knee replacement 09/04/2021   Leukocytosis 10/11/2020   PAD (peripheral artery disease) (HCC) 06/29/2020   Prediabetes 04/22/2020   Chronic kidney disease, stage 3a (HCC) 04/15/2020   Severe tobacco dependence 04/12/2020   Lower urinary tract  symptoms (LUTS) 04/12/2020   History of colon polyps 04/12/2020   Atherosclerosis of native arteries of extremity with intermittent claudication (HCC) 03/01/2020   Pain in both feet 07/31/2019   Status post total replacement of right hip 05/25/2019   Lumbar radiculopathy 02/05/2019    PCP: Loyola Mast MD  REFERRING PROVIDER: Horton Chin, MD  REFERRING DIAG: [phone] (ICD-10-CM) - Bilateral hip pain  THERAPY DIAG:  Difficulty in walking, not elsewhere classified  Pain in right hip  Pain in left hip  Muscle  weakness (generalized)  Muscle spasm of back  Rationale for Evaluation and Treatment: Rehabilitation  ONSET DATE: chronic   SUBJECTIVE:   SUBJECTIVE STATEMENT: Since Wed. Patient has had a sharp pain in his Rt buttock.  Still gets worse with walking, but it is constant.  He has had sciatica before. He has not done the tennis ball since then.  Stretches have not helped.  He does not use heat or ice.    PERTINENT HISTORY: Rt THA 2021, R TKA 2023, lumbar fusion (L4-L5) 2020 PAIN:  Are you having pain? Yes: NPRS scale: 2/10 Pain location: Rt buttock   Pain description: deep ache, sharp   Aggravating factors: walking  Relieving factors: standing , leaning , holding on to something, stopping  Pain goes to 10/10 with walking 10 min   Back is tender this AM 10/12/22 : Yesterday it was 6/10-7/10.   PRECAUTIONS: None  RED FLAGS: None   WEIGHT BEARING RESTRICTIONS: No  FALLS:  Has patient fallen in last 6 months? No  LIVING ENVIRONMENT: Lives with: lives with their spouse Lives in: House/apartment Stairs: Yes: Internal: 12 steps; on right going up Has following equipment at home: None has a cane but does not use it   OCCUPATION: Retired, [employer] industrial   PLOF: Independent  PATIENT GOALS: Patient wants to walk without pain   NEXT MD VISIT: as needed   OBJECTIVE:   DIAGNOSTIC FINDINGS:  08/2022 He does have moderate  degenerative changes to the left hip joint  Rt hip prosthesis in place.  IMPRESSION: Lumbar  1. Status post posterior instrumented fusion and decompression at L4-L5 without residual spinal canal or neural foraminal stenosis. 2. Adjacent segment disease at L3-L4 with trace retrolisthesis, disc bulge, and bilateral facet arthropathy resulting in mild spinal canal and bilateral subarticular zone without evidence of frank nerve root impingement, and mild left worse than right neural foraminal stenosis. 3. Mild left worse than right subarticular zone narrowing  L2-L3.    PATIENT SURVEYS:  FOTO 50%  10/12/22 FOTO 51%   COGNITION: Overall cognitive status: Within functional limits for tasks assessed     SENSATION: WFL   MUSCLE LENGTH: Hamstrings: tight  Thomas test: neg Quads: tight in prone   POSTURE: increased thoracic kyphosis, flexed trunk , and Low Rt shoulder   PALPATION: TTP  LUMBAR ROM:   Active  A/PROM  eval  Flexion Distal shin Hamstring tightness   Extension 50%  Right lateral flexion 50%  Left lateral flexion 50%  Right rotation 50%  Left rotation 50%  Stretch, no pain    LOWER EXTREMITY ROM:  Passive ROM Right eval Left eval  Hip flexion WFL, min ant hip pain  WFL  Hip extension    Hip abduction    Hip adduction    Hip internal rotation Min tight WFL  Hip external rotation Min tight  WFL   Knee flexion [facility]  Knee extension [facility]  Ankle dorsiflexion  Ankle plantarflexion    Ankle inversion    Ankle eversion     (Blank rows = not tested)  LOWER EXTREMITY MMT:  MMT Right eval Left eval Rt./Lt.  09/10/22  Hip flexion  4 4   Hip extension   4+/5  Hip abduction   4+/5   Hip adduction     Hip internal rotation     Hip external rotation     Knee flexion 5 5   Knee extension 5 5   Ankle dorsiflexion     Ankle plantarflexion     Ankle inversion     Ankle eversion      (Blank rows = not tested)  LOWER EXTREMITY SPECIAL TESTS:  Hip special tests: Luisa Hart (FABER) test: negative, Trendelenburg test: negative, and Thomas test: negative  FUNCTIONAL TESTS:  5 times sit to stand: 10. 9 sec  2 minute walk test: 449 feet, pain L side 2/10.     GAIT: Distance walked: 449 feet , pain increased   Assistive device utilized: None Level of assistance: Modified independence Comments: increased L sided back and L ASIS /hip pain with this   TODAY'S TREATMENT:                                                                                                                              [facility] Adult PT  Treatment:                                                DATE: 10/12/22 Therapeutic Exercise: Nustep 6 min L 4 UE/LE  Mat/hip stretching: Knees crossed with rotation, piriformis  Post/anterior pelvic tilt x 10  Knee/s to chest  LTR x 10  Manual Therapy: PROM Rt hip  LAD Rt LE x 5  Prone/sidelying trigger point work to Rt hip, glute  Modalities: Cold pack 10 min prone  Self Care: Dry needling, plan of care    [facility] Adult PT Treatment:                                                DATE: 10/09/22 Therapeutic Exercise: TM up to 1.6 mph for 5 min , pain increased to 5/10 on L hip and 4/10 Rt hip Lower trunk rotation x 5  Single and then double knee chest  Bridge with green band x 10 Bridge with hips ER/IR green band x 10  SLR with Pilates circle on opposite thigh x 10  Sidelying green band clam x 10  Standing Hip abduction x 10   Self Care: Tennis ball on the wall for post hip  Dry needling   [facility] Adult PT Treatment:  DATE: 10/05/22 Therapeutic Exercise: Recumbent bike L2 for 6 min Sit to stand 15 lbs x 15  Squat x 15 lbs x 15  2 min walk to assess pain, pain began in L post thigh, minimal (2/10) Step ups 6 inch x 15 , 2 sets with and without weight (15 lbs)  Calf stretch at the wall  Supine hamstring/ITB stretch x 3 with sheet Knees crossed with LTR x 5   Manual Therapy: Long axis distraction x 3 to each leg, 30 seconds each at varying angles   [facility] Adult PT Treatment:                                                DATE: 10/03/22 Therapeutic Exercise: NuStep L5 UE and LE for 6 min  Knee to chest  Lower trunk rotation arms out x 10 Hamstring stretch Supine physioball : hamstring curls, bridge x 15  Core press x 5 SLR x 10 with core focus  Supine arm arcs x 10 with alt knee lift green band  Standing hip flexor stretch x 3 each side  Standing adductor stretch x 3      [facility] Adult PT Treatment:                                                 DATE: 09/26/22 Therapeutic Exercise: NuStep LE and UE for 6 min, L 5 Bridges x 15  LTR x 10 Knee to chest x 3, then 30 sec to opposite shoulder  Hamstring seated 30 sec x 2  Sit to stand 15 lbs x 10 x 2 Suitcase carry 15 lbs each arm x 180 feet  Standing core tandem, KB pass 30 sec each  Knee extension 25 lbs x 10 x 2  Hamstring 35 lbs 2 x 10  Leg press 2 plates x 15  Self Care: Gym memberships, [employer] and form with gym machines     [facility] Adult PT Treatment:                                                DATE: 09/19/22 Therapeutic Exercise: NuStep L 6 UE and LE for 6 min  Supine knee to chest Supine knee to opp shoulder Supported knee extension/hamstring with towel under thigh x 10 each LE  Lower trunk rotation x 10  Wide knee LTR x 10 (Internal rotation) Glute Bridge with blue band around thighs  Sit to stand 15 lbs x 10  Squat 15 lbs x 10  Dead lift x 15 lbs x 10  Modified hip hinge due to back pain, no wgt hands on thighs. Min increase in back pain .    [facility] Adult PT Treatment:                                                DATE: 09/10/22 Therapeutic Exercise: Nustep L5 for 6 min UE and LE  Standing ant hip stretch 2 x 30  Supine knee to chest with hip circles (passive using UEs )  Rt ant hip stretch off table  Seated Hamstring Stretch x 2 , 30 sec  Supine Bridge  x 10, 5 set Supine Lower Trunk Rotation  x 10  Double knee to chest x 30 sec  Quadruped rocking for hip opening to child's pose   Manual Therapy: PROM hips and assessment of lumbar spasm, paraspinals and glutes  PT Eval: 09/06/22 PT eval and assessment, differential diagnosis     PATIENT EDUCATION:  Education details: see above Person educated: Patient Education method: Explanation, Demonstration, and Handouts Education comprehension: verbalized understanding  HOME EXERCISE PROGRAM:  Access Code: [email or web address] URL: [email or web address] Date: 10/03/2022 Prepared  by: Karie Mainland  Exercises - Seated Hamstring Stretch  - 1-2 x daily - 7 x weekly - 1 sets - 3 reps - 30 hold - Supine Lower Trunk Rotation  - 2 x daily - 7 x weekly - 2 sets - 10 reps - Supine Bridge  - 2 x daily - 7 x weekly - 2 sets - 10 reps - Hooklying Single Knee to Chest Stretch  - 1 x daily - 7 x weekly - 1 sets - 3 reps - 30 hold - Cat to Child's Pose with Posterior Pelvic Tilt  - 1 x daily - 7 x weekly - 1 sets - 5-10 reps - 10-30 hold - Supine ASLR with Ab Bracing and Shoulder Extension with Anchored Resistance  - 1 x daily - 7 x weekly - 2 sets - 10 reps - 5 hold Added side lying clam   ASSESSMENT:  CLINICAL IMPRESSION: Patient with increased spasm and pain in rt buttock (Piriformis) since last visit.  Manual therapy was effective for reducing spasm.  Pt is open to DN if he can get in within his POC.  Encouraged light stretching and core stability as tolerated .     OBJECTIVE IMPAIRMENTS: decreased activity tolerance, decreased mobility, difficulty walking, decreased ROM, hypomobility, increased fascial restrictions, impaired flexibility, improper body mechanics, postural dysfunction, and pain.   ACTIVITY LIMITATIONS: standing and locomotion level  PARTICIPATION LIMITATIONS: shopping and community activity  PERSONAL FACTORS: Past/current experiences, Time since onset of injury/illness/exacerbation, and 3+ comorbidities: previous back surgery, Rt knee, Rt hip replacement   are also affecting patient's functional outcome.   REHAB POTENTIAL: Good  CLINICAL DECISION MAKING: Evolving/moderate complexity  EVALUATION COMPLEXITY: Moderate   GOALS: Goals reviewed with patient? Yes  SHORT TERM GOALS: Target date: 09/27/2022   Pt will be I with HEP for core and lifting, hips  Baseline: unknown, does have HEP from previous PT, does occasionally  Goal status: Met   2.  Pt will be able to walk on level surface for 5 min with pain < 5/10 in hips  Baseline: 2 min , increased pain  L hip and back (4/10)  Goal status:MET   LONG TERM GOALS: Target date: 11/01/2022    Pt will improve FOTO score to 61% or better to demo improved functional mobility  Baseline: 50% Goal status: INITIAL  2.  Pt will be able to walk in the grocery store with overall min increased in hip pain using the cart for 30 min .  Baseline: 15 min pain can be 9/10-10/10 Goal status: INITIAL  3.  Pt will be able to show I with final HEP  Baseline: unknown  Goal status: INITIAL  4.  Pt will be able to show proper squat and lifting to preserve back with functional activities at  home  Baseline: needs cues  Goal status: INITIAL  5.  Pt will be able to perform a plank for 30 sec (modified if needed) without increased back pain  Baseline: NT on eval  Goal status: INITIAL   PLAN:  PT FREQUENCY: 2x/week  PT DURATION: 8 weeks 6-8 weeks   PLANNED INTERVENTIONS: Therapeutic exercises, Therapeutic activity, Neuromuscular re-education, Patient/Family education, Self Care, Joint mobilization, Spinal mobilization, Cryotherapy, Moist heat, Manual therapy, and Re-evaluation  PLAN FOR NEXT SESSION:  Manual/stretch to Rt post hip  Balance screen? Squat/lift/hinge/ plank   Danetra Glock, PT 10/12/2022, 12:45 PM   Karie Mainland, PT 10/12/22 12:45 PM Phone: [phone] Fax: [phone]

## 2022-10-12 ENCOUNTER — Encounter: Payer: Self-pay | Admitting: Physical Therapy

## 2022-10-12 ENCOUNTER — Ambulatory Visit: Payer: Medicare Other | Admitting: Physical Therapy

## 2022-10-12 DIAGNOSIS — R262 Difficulty in walking, not elsewhere classified: Secondary | ICD-10-CM

## 2022-10-12 DIAGNOSIS — M25551 Pain in right hip: Secondary | ICD-10-CM

## 2022-10-12 DIAGNOSIS — M6281 Muscle weakness (generalized): Secondary | ICD-10-CM

## 2022-10-12 DIAGNOSIS — M62830 Muscle spasm of back: Secondary | ICD-10-CM

## 2022-10-12 DIAGNOSIS — M25552 Pain in left hip: Secondary | ICD-10-CM

## 2022-10-16 NOTE — Therapy (Unsigned)
OUTPATIENT PHYSICAL THERAPY LOWER EXTREMITY TREATMENT   Patient Name: Gerald Carpenter MRN: [id number] DOB:[birth year], 72 y.o., male Today's Date: 10/17/2022  END OF SESSION:  PT End of Session - 10/17/22 0751     Visit Number 9    Number of Visits 16    Date for PT Re-Evaluation 11/01/22    Authorization Type MCR/A[employer]    PT Start Time 0800    PT Stop Time 0845    PT Time Calculation (min) 45 min    Activity Tolerance Patient tolerated treatment well    Behavior During Therapy WFL for tasks assessed/performed                     Past Medical History:  Diagnosis Date   Alcoholism (HCC)    Arthritis    Cataract    Colon polyps    Complication of anesthesia    HLD (hyperlipidemia)    Nephritis    when pt. was 7 yrs. ols   Peripheral vascular disease (HCC)    PONV (postoperative nausea and vomiting)    "Only when I have aortagram on 06/23/20".   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   Substance abuse [facility])    Past Surgical History:  Procedure Laterality Date   ABDOMINAL AORTOGRAM W/LOWER EXTREMITY N/A 06/23/2020   Procedure: ABDOMINAL AORTOGRAM W/LOWER EXTREMITY;  Surgeon: Cephus Shelling, MD;  Location: [facility];  Service: Cardiovascular;  Laterality: N/A;   ANTERIOR LAT LUMBAR FUSION Right 02/05/2019   Procedure: ATTEMPTED ANTERIOR LATERAL INTERBODY FUSION;  Surgeon: Lisbeth Renshaw, MD;  Location: [facility] OR;  Service: Neurosurgery;  Laterality: Right;  RIGHT LATERAL TRANSPSOAS DISCECTOMY AND FUSION WITH ROBOTIC ASSISTED PEDICLE SCREW STABILIZATION, LUMBAR 4- LUMBAR 5   CATARACT EXTRACTION W/ INTRAOCULAR LENS  IMPLANT, BILATERAL Bilateral 2013   lens implants for cataracts after Lasik   COLONOSCOPY     ENDOVEIN HARVEST OF GREATER SAPHENOUS VEIN Left 06/29/2020   Procedure: HARVEST OF LEFT GREATER SAPHENOUS VEIN;  Surgeon: Cephus Shelling, MD;  Location: [facility] OR;  Service: Vascular;  Laterality: Left;   FEMORAL-POPLITEAL BYPASS GRAFT  Left 06/29/2020   Procedure: BYPASS GRAFT LEFT COMMON FEMORAL TO ABOVE KNEE POPLITEAL ARTERY;  Surgeon: Cephus Shelling, MD;  Location: [facility];  Service: Vascular;  Laterality: Left;   HEMORRHOID SURGERY N/A 04/06/2021   Procedure: HEMORRHOIDECTOMY 2 COLUMN;  Surgeon: Andria Meuse, MD;  Location: [facility];  Service: General;  Laterality: N/A;   KNEE ARTHROSCOPY Right 1980   RECTAL EXAM UNDER ANESTHESIA N/A 04/06/2021   Procedure: ANORECTAL EXAM UNDER ANESTHESIA/ excision of anal polyp;  Surgeon: Andria Meuse, MD;  Location: [facility];  Service: General;  Laterality: N/A;   TONSILLECTOMY     at a very young age, but grew back   TOTAL HIP ARTHROPLASTY Right 05/25/2019   Procedure: RIGHT TOTAL HIP ARTHROPLASTY ANTERIOR APPROACH;  Surgeon: Tarry Kos, MD;  Location: [facility];  Service: Orthopedics;  Laterality: Right;   TOTAL KNEE ARTHROPLASTY Right 09/04/2021   Procedure: RIGHT TOTAL KNEE ARTHROPLASTY;  Surgeon: Tarry Kos, MD;  Location: [facility];  Service: Orthopedics;  Laterality: Right;   Patient Active Problem List   Diagnosis Date Noted   Status post total right knee replacement 09/04/2021   Leukocytosis 10/11/2020   PAD (peripheral artery disease) (HCC) 06/29/2020   Prediabetes 04/22/2020   Chronic kidney disease, stage 3a (HCC) 04/15/2020   Severe tobacco dependence 04/12/2020   Lower urinary  tract symptoms (LUTS) 04/12/2020   History of colon polyps 04/12/2020   Atherosclerosis of native arteries of extremity with intermittent claudication (HCC) 03/01/2020   Pain in both feet 07/31/2019   Status post total replacement of right hip 05/25/2019   Lumbar radiculopathy 02/05/2019    PCP: Loyola Mast MD  REFERRING PROVIDER: Horton Chin, MD  REFERRING DIAG: [phone] (ICD-10-CM) - Bilateral hip pain  THERAPY DIAG:  Difficulty in walking, not elsewhere classified  Pain in right hip  Pain in left hip  Muscle  weakness (generalized)  Muscle spasm of back  Rationale for Evaluation and Treatment: Rehabilitation  ONSET DATE: chronic   SUBJECTIVE:   SUBJECTIVE STATEMENT: I've been stretching it and it has been better.  Used a tennis ball every other day. I can feel it but its not hurting right now.    PERTINENT HISTORY: Rt THA 2021, R TKA 2023, lumbar fusion (L4-L5) 2020 PAIN:  Are you having pain? Yes: NPRS scale: 2/10 Pain location: Rt buttock   Pain description: deep ache, sharp   Aggravating factors: walking  Relieving factors: standing , leaning , holding on to something, stopping  Pain goes to 10/10 with walking 10 min   Back is tender this AM 10/12/22 : Yesterday it was 6/10-7/10.   PRECAUTIONS: None  RED FLAGS: None   WEIGHT BEARING RESTRICTIONS: No  FALLS:  Has patient fallen in last 6 months? No  LIVING ENVIRONMENT: Lives with: lives with their spouse Lives in: House/apartment Stairs: Yes: Internal: 12 steps; on right going up Has following equipment at home: None has a cane but does not use it   OCCUPATION: Retired, [employer]   PLOF: Independent  PATIENT GOALS: Patient wants to walk without pain   NEXT MD VISIT: as needed   OBJECTIVE:   DIAGNOSTIC FINDINGS:  08/2022 He does have moderate  degenerative changes to the left hip joint  Rt hip prosthesis in place.  IMPRESSION: Lumbar  1. Status post posterior instrumented fusion and decompression at L4-L5 without residual spinal canal or neural foraminal stenosis. 2. Adjacent segment disease at L3-L4 with trace retrolisthesis, disc bulge, and bilateral facet arthropathy resulting in mild spinal canal and bilateral subarticular zone without evidence of frank nerve root impingement, and mild left worse than right neural foraminal stenosis. 3. Mild left worse than right subarticular zone narrowing L2-L3.    PATIENT SURVEYS:  FOTO 50%  10/12/22 FOTO 51%   COGNITION: Overall cognitive status: Within  functional limits for tasks assessed     SENSATION: WFL   MUSCLE LENGTH: Hamstrings: tight  Thomas test: neg Quads: tight in prone   POSTURE: increased thoracic kyphosis, flexed trunk , and Low Rt shoulder   PALPATION: TTP  LUMBAR ROM:   Active  A/PROM  eval  Flexion Distal shin Hamstring tightness   Extension 50%  Right lateral flexion 50%  Left lateral flexion 50%  Right rotation 50%  Left rotation 50%  Stretch, no pain    LOWER EXTREMITY ROM:  Passive ROM Right eval Left eval  Hip flexion WFL, min ant hip pain  WFL  Hip extension    Hip abduction    Hip adduction    Hip internal rotation Min tight WFL  Hip external rotation Min tight  WFL   Knee flexion [facility]  Knee extension [facility]  Ankle dorsiflexion    Ankle plantarflexion    Ankle inversion    Ankle eversion     (Blank rows = not tested)  LOWER EXTREMITY MMT:  MMT Right eval Left eval Rt./Lt.  09/10/22  Hip flexion  4 4   Hip extension   4+/5  Hip abduction   4+/5   Hip adduction     Hip internal rotation     Hip external rotation     Knee flexion 5 5   Knee extension 5 5   Ankle dorsiflexion     Ankle plantarflexion     Ankle inversion     Ankle eversion      (Blank rows = not tested)  LOWER EXTREMITY SPECIAL TESTS:  Hip special tests: Luisa Hart (FABER) test: negative, Trendelenburg test: negative, and Thomas test: negative  FUNCTIONAL TESTS:  5 times sit to stand: 10. 9 sec  2 minute walk test: 449 feet, pain L side 2/10.     GAIT: Distance walked: 449 feet , pain increased   Assistive device utilized: None Level of assistance: Modified independence Comments: increased L sided back and L ASIS /hip pain with this   TODAY'S TREATMENT:           [facility] Adult PT Treatment:                                                DATE: 10/17/22 Therapeutic Exercise: Treadmill 1.8 mph for 5 min , increased pain in Hips 5/10 Knees to chest double and single 2-3 each side  Physio ball:  hamstring curl, bridge and oblique rotation  Book opening x 5  Sidelying hip abduction x 15  Seated piriformis stretching figure 4 modified  Hip adductor stretch at sink Step ups 6 inch x 15 each side  Stagger stance hip hinge no wgt (15 lbs ), difficulty with Rt leg, spasm Sink stretch x 5                                                                                                                        [facility] Adult PT Treatment:                                                DATE: 10/12/22 Therapeutic Exercise: Nustep 6 min L 4 UE/LE  Mat/hip stretching: Knees crossed with rotation, piriformis  Post/anterior pelvic tilt x 10  Knee/s to chest  LTR x 10  Manual Therapy: PROM Rt hip  LAD Rt LE x 5  Prone/sidelying trigger point work to Rt hip, glute  Modalities: Cold pack 10 min prone  Self Care: Dry needling, plan of care    [facility] Adult PT Treatment:  DATE: 10/09/22 Therapeutic Exercise: TM up to 1.6 mph for 5 min , pain increased to 5/10 on L hip and 4/10 Rt hip Lower trunk rotation x 5  Single and then double knee chest  Bridge with green band x 10 Bridge with hips ER/IR green band x 10  SLR with Pilates circle on opposite thigh x 10  Sidelying green band clam x 10  Standing Hip abduction x 10   Self Care: Tennis ball on the wall for post hip  Dry needling   [facility] Adult PT Treatment:                                                DATE: 10/05/22 Therapeutic Exercise: Recumbent bike L2 for 6 min Sit to stand 15 lbs x 15  Squat x 15 lbs x 15  2 min walk to assess pain, pain began in L post thigh, minimal (2/10) Step ups 6 inch x 15 , 2 sets with and without weight (15 lbs)  Calf stretch at the wall  Supine hamstring/ITB stretch x 3 with sheet Knees crossed with LTR x 5   Manual Therapy: Long axis distraction x 3 to each leg, 30 seconds each at varying angles   [facility] Adult PT Treatment:                                                 DATE: 10/03/22 Therapeutic Exercise: NuStep L5 UE and LE for 6 min  Knee to chest  Lower trunk rotation arms out x 10 Hamstring stretch Supine physioball : hamstring curls, bridge x 15  Core press x 5 SLR x 10 with core focus  Supine arm arcs x 10 with alt knee lift green band  Standing hip flexor stretch x 3 each side  Standing adductor stretch x 3      [facility] Adult PT Treatment:                                                DATE: 09/26/22 Therapeutic Exercise: NuStep LE and UE for 6 min, L 5 Bridges x 15  LTR x 10 Knee to chest x 3, then 30 sec to opposite shoulder  Hamstring seated 30 sec x 2  Sit to stand 15 lbs x 10 x 2 Suitcase carry 15 lbs each arm x 180 feet  Standing core tandem, KB pass 30 sec each  Knee extension 25 lbs x 10 x 2  Hamstring 35 lbs 2 x 10  Leg press 2 plates x 15  Self Care: Gym memberships, [employer] and form with gym machines     [facility] Adult PT Treatment:                                                DATE: 09/19/22 Therapeutic Exercise: NuStep L 6 UE and LE for 6 min  Supine knee to chest Supine knee to opp shoulder Supported knee extension/hamstring with towel  under thigh x 10 each LE  Lower trunk rotation x 10  Wide knee LTR x 10 (Internal rotation) Glute Bridge with blue band around thighs  Sit to stand 15 lbs x 10  Squat 15 lbs x 10  Dead lift x 15 lbs x 10  Modified hip hinge due to back pain, no wgt hands on thighs. Min increase in back pain .    [facility] Adult PT Treatment:                                                DATE: 09/10/22 Therapeutic Exercise: Nustep L5 for 6 min UE and LE  Standing ant hip stretch 2 x 30  Supine knee to chest with hip circles (passive using UEs )  Rt ant hip stretch off table  Seated Hamstring Stretch x 2 , 30 sec  Supine Bridge  x 10, 5 set Supine Lower Trunk Rotation  x 10  Double knee to chest x 30 sec  Quadruped rocking for hip opening to child's pose   Manual Therapy: PROM hips and  assessment of lumbar spasm, paraspinals and glutes  PT Eval: 09/06/22 PT eval and assessment, differential diagnosis     PATIENT EDUCATION:  Education details: see above Person educated: Patient Education method: Explanation, Demonstration, and Handouts Education comprehension: verbalized understanding  HOME EXERCISE PROGRAM:  Access Code: [email or web address] URL: [email or web address] Date: 10/03/2022 Prepared by: Karie Mainland  Exercises - Seated Hamstring Stretch  - 1-2 x daily - 7 x weekly - 1 sets - 3 reps - 30 hold - Supine Lower Trunk Rotation  - 2 x daily - 7 x weekly - 2 sets - 10 reps - Supine Bridge  - 2 x daily - 7 x weekly - 2 sets - 10 reps - Hooklying Single Knee to Chest Stretch  - 1 x daily - 7 x weekly - 1 sets - 3 reps - 30 hold - Cat to Child's Pose with Posterior Pelvic Tilt  - 1 x daily - 7 x weekly - 1 sets - 5-10 reps - 10-30 hold - Supine ASLR with Ab Bracing and Shoulder Extension with Anchored Resistance  - 1 x daily - 7 x weekly - 2 sets - 10 reps - 5 hold Added side lying clam   ASSESSMENT:  CLINICAL IMPRESSION: Patient with less spasm in Rt buttocks.  Bilateral hip pain continues with walking even 5 min.  Patient limited in hip external rotation significantly. Patient tolerated session with modifications for pain, increased time for transitions. He is making slow progress, mostly with mobility but not necessarily pain.    OBJECTIVE IMPAIRMENTS: decreased activity tolerance, decreased mobility, difficulty walking, decreased ROM, hypomobility, increased fascial restrictions, impaired flexibility, improper body mechanics, postural dysfunction, and pain.   ACTIVITY LIMITATIONS: standing and locomotion level  PARTICIPATION LIMITATIONS: shopping and community activity  PERSONAL FACTORS: Past/current experiences, Time since onset of injury/illness/exacerbation, and 3+ comorbidities: previous back surgery, Rt knee, Rt hip replacement   are also affecting  patient's functional outcome.   REHAB POTENTIAL: Good  CLINICAL DECISION MAKING: Evolving/moderate complexity  EVALUATION COMPLEXITY: Moderate   GOALS: Goals reviewed with patient? Yes  SHORT TERM GOALS: Target date: 09/27/2022   Pt will be I with HEP for core and lifting, hips  Baseline: unknown, does have HEP from previous PT, does occasionally  Goal status: Met  2.  Pt will be able to walk on level surface for 5 min with pain < 5/10 in hips  Baseline: 2 min , increased pain L hip and back (4/10)  Goal status:MET   LONG TERM GOALS: Target date: 11/01/2022    Pt will improve FOTO score to 61% or better to demo improved functional mobility  Baseline: 50% Goal status: INITIAL  2.  Pt will be able to walk in the grocery store with overall min increased in hip pain using the cart for 30 min .  Baseline: 15 min pain can be 9/10-10/10 Goal status: INITIAL  3.  Pt will be able to show I with final HEP  Baseline: unknown  Goal status: INITIAL  4.  Pt will be able to show proper squat and lifting to preserve back with functional activities at home  Baseline: needs cues  Goal status: INITIAL  5.  Pt will be able to perform a plank for 30 sec (modified if needed) without increased back pain  Baseline: NT on eval  Goal status: INITIAL   PLAN:  PT FREQUENCY: 2x/week  PT DURATION: 8 weeks 6-8 weeks   PLANNED INTERVENTIONS: Therapeutic exercises, Therapeutic activity, Neuromuscular re-education, Patient/Family education, Self Care, Joint mobilization, Spinal mobilization, Cryotherapy, Moist heat, Manual therapy, and Re-evaluation  PLAN FOR NEXT SESSION:  Dry needling to post hip (s).  Cont hip mobility, core strength as tolerated. Squat/lift/hinge/ plank   Halim Surrette, PT 10/17/2022, 8:43 AM   Karie Mainland, PT 10/17/22 8:43 AM Phone: [phone] Fax: [phone]

## 2022-10-17 ENCOUNTER — Encounter: Payer: Self-pay | Admitting: Physical Therapy

## 2022-10-17 ENCOUNTER — Ambulatory Visit: Payer: Medicare Other | Admitting: Physical Therapy

## 2022-10-17 DIAGNOSIS — M25552 Pain in left hip: Secondary | ICD-10-CM

## 2022-10-17 DIAGNOSIS — R262 Difficulty in walking, not elsewhere classified: Secondary | ICD-10-CM

## 2022-10-17 DIAGNOSIS — M62830 Muscle spasm of back: Secondary | ICD-10-CM

## 2022-10-17 DIAGNOSIS — M25551 Pain in right hip: Secondary | ICD-10-CM

## 2022-10-17 DIAGNOSIS — M6281 Muscle weakness (generalized): Secondary | ICD-10-CM

## 2022-10-18 NOTE — Therapy (Signed)
OUTPATIENT PHYSICAL THERAPY TREATMENT   Patient Name: Gerald Carpenter MRN: [id number] DOB:Mar 28, 1950, 72 y.o., male Today's Date: 10/19/2022   END OF SESSION:  PT End of Session - 10/19/22 0915     Visit Number 10    Number of Visits 16    Date for PT Re-Evaluation 11/01/22    Authorization Type MCR/[employer]    PT Start Time 0845    PT Stop Time 0925    PT Time Calculation (min) 40 min    Activity Tolerance Patient tolerated treatment well    Behavior During Therapy WFL for tasks assessed/performed                      Past Medical History:  Diagnosis Date   Alcoholism (HCC)    Arthritis    Cataract    Colon polyps    Complication of anesthesia    HLD (hyperlipidemia)    Nephritis    when pt. was 7 yrs. ols   Peripheral vascular disease (HCC)    PONV (postoperative nausea and vomiting)    "Only when I have aortagram on 06/23/20".   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   Substance abuse [facility])    Past Surgical History:  Procedure Laterality Date   ABDOMINAL AORTOGRAM W/LOWER EXTREMITY N/A 06/23/2020   Procedure: ABDOMINAL AORTOGRAM W/LOWER EXTREMITY;  Surgeon: Cephus Shelling, MD;  Location: [facility];  Service: Cardiovascular;  Laterality: N/A;   ANTERIOR LAT LUMBAR FUSION Right 02/05/2019   Procedure: ATTEMPTED ANTERIOR LATERAL INTERBODY FUSION;  Surgeon: Lisbeth Renshaw, MD;  Location: [facility] OR;  Service: Neurosurgery;  Laterality: Right;  RIGHT LATERAL TRANSPSOAS DISCECTOMY AND FUSION WITH ROBOTIC ASSISTED PEDICLE SCREW STABILIZATION, LUMBAR 4- LUMBAR 5   CATARACT EXTRACTION W/ INTRAOCULAR LENS  IMPLANT, BILATERAL Bilateral 2013   lens implants for cataracts after Lasik   COLONOSCOPY     ENDOVEIN HARVEST OF GREATER SAPHENOUS VEIN Left 06/29/2020   Procedure: HARVEST OF LEFT GREATER SAPHENOUS VEIN;  Surgeon: Cephus Shelling, MD;  Location: [facility] OR;  Service: Vascular;  Laterality: Left;   FEMORAL-POPLITEAL BYPASS GRAFT Left  06/29/2020   Procedure: BYPASS GRAFT LEFT COMMON FEMORAL TO ABOVE KNEE POPLITEAL ARTERY;  Surgeon: Cephus Shelling, MD;  Location: [facility] OR;  Service: Vascular;  Laterality: Left;   HEMORRHOID SURGERY N/A 04/06/2021   Procedure: HEMORRHOIDECTOMY 2 COLUMN;  Surgeon: Andria Meuse, MD;  Location: [facility];  Service: General;  Laterality: N/A;   KNEE ARTHROSCOPY Right 1980   RECTAL EXAM UNDER ANESTHESIA N/A 04/06/2021   Procedure: ANORECTAL EXAM UNDER ANESTHESIA/ excision of anal polyp;  Surgeon: Andria Meuse, MD;  Location: [facility];  Service: General;  Laterality: N/A;   TONSILLECTOMY     at a very young age, but grew back   TOTAL HIP ARTHROPLASTY Right 05/25/2019   Procedure: RIGHT TOTAL HIP ARTHROPLASTY ANTERIOR APPROACH;  Surgeon: Tarry Kos, MD;  Location: [facility] OR;  Service: Orthopedics;  Laterality: Right;   TOTAL KNEE ARTHROPLASTY Right 09/04/2021   Procedure: RIGHT TOTAL KNEE ARTHROPLASTY;  Surgeon: Tarry Kos, MD;  Location: [facility] OR;  Service: Orthopedics;  Laterality: Right;   Patient Active Problem List   Diagnosis Date Noted   Status post total right knee replacement 09/04/2021   Leukocytosis 10/11/2020   PAD (peripheral artery disease) (HCC) 06/29/2020   Prediabetes 04/22/2020   Chronic kidney disease, stage 3a (HCC) 04/15/2020   Severe tobacco dependence 04/12/2020   Lower urinary  tract symptoms (LUTS) 04/12/2020   History of colon polyps 04/12/2020   Atherosclerosis of native arteries of extremity with intermittent claudication (HCC) 03/01/2020   Pain in both feet 07/31/2019   Status post total replacement of right hip 05/25/2019   Lumbar radiculopathy 02/05/2019    PCP: Loyola Mast MD  REFERRING PROVIDER: Horton Chin, MD  REFERRING DIAG: [phone] (ICD-10-CM) - Bilateral hip pain  THERAPY DIAG:  Difficulty in walking, not elsewhere classified  Pain in right hip  Pain in left hip  Muscle  weakness (generalized)  Muscle spasm of back  Rationale for Evaluation and Treatment: Rehabilitation  ONSET DATE: chronic   SUBJECTIVE:   SUBJECTIVE STATEMENT: Patient reports bilateral hip pain on the outside of the hips, and then more recently right deep hip pain in the buttock that limits walking.    PERTINENT HISTORY: Rt THA 2021, R TKA 2023, lumbar fusion (L4-L5) 2020 PAIN:  Are you having pain? Yes: NPRS scale: 2/10 Pain location: Rt buttock   Pain description: deep ache, sharp   Aggravating factors: walking  Relieving factors: standing , leaning , holding on to something, stopping  Pain goes to 10/10 with walking 10 min   Back is tender this AM 10/12/22 : Yesterday it was 6/10-7/10.   PRECAUTIONS: None  RED FLAGS: None   WEIGHT BEARING RESTRICTIONS: No  FALLS:  Has patient fallen in last 6 months? No  LIVING ENVIRONMENT: Lives with: lives with their spouse Lives in: House/apartment Stairs: Yes: Internal: 12 steps; on right going up Has following equipment at home: None has a cane but does not use it   OCCUPATION: Retired, [employer] industrial   PLOF: Independent  PATIENT GOALS: Patient wants to walk without pain   NEXT MD VISIT: as needed   OBJECTIVE:   DIAGNOSTIC FINDINGS:  08/2022 He does have moderate  degenerative changes to the left hip joint  Rt hip prosthesis in place.  IMPRESSION: Lumbar  1. Status post posterior instrumented fusion and decompression at L4-L5 without residual spinal canal or neural foraminal stenosis. 2. Adjacent segment disease at L3-L4 with trace retrolisthesis, disc bulge, and bilateral facet arthropathy resulting in mild spinal canal and bilateral subarticular zone without evidence of frank nerve root impingement, and mild left worse than right neural foraminal stenosis. 3. Mild left worse than right subarticular zone narrowing L2-L3.    PATIENT SURVEYS:  FOTO 50%  10/12/22 FOTO 51%   COGNITION: Overall cognitive  status: Within functional limits for tasks assessed     SENSATION: WFL   MUSCLE LENGTH: Hamstrings: tight  Thomas test: neg Quads: tight in prone   POSTURE: increased thoracic kyphosis, flexed trunk , and Low Rt shoulder   PALPATION: TTP  LUMBAR ROM:   Active  A/PROM  eval  Flexion Distal shin Hamstring tightness   Extension 50%  Right lateral flexion 50%  Left lateral flexion 50%  Right rotation 50%  Left rotation 50%  Stretch, no pain    LOWER EXTREMITY ROM:  Passive ROM Right eval Left eval  Hip flexion WFL, min ant hip pain  WFL  Hip extension    Hip abduction    Hip adduction    Hip internal rotation Min tight WFL  Hip external rotation Min tight  WFL   Knee flexion [facility]  Knee extension [facility]  Ankle dorsiflexion    Ankle plantarflexion    Ankle inversion    Ankle eversion     (Blank rows = not tested)  LOWER  EXTREMITY MMT:  MMT Right eval Left eval Rt./Lt.  09/10/22  Hip flexion  4 4   Hip extension   4+/5  Hip abduction   4+/5   Hip adduction     Hip internal rotation     Hip external rotation     Knee flexion 5 5   Knee extension 5 5   Ankle dorsiflexion     Ankle plantarflexion     Ankle inversion     Ankle eversion      (Blank rows = not tested)  LOWER EXTREMITY SPECIAL TESTS:  Hip special tests: Luisa Hart (FABER) test: negative, Trendelenburg test: negative, and Thomas test: negative  FUNCTIONAL TESTS:  5 times sit to stand: 10. 9 sec  2 minute walk test: 449 feet, pain L side 2/10.     GAIT: Distance walked: 449 feet , pain increased   Assistive device utilized: None Level of assistance: Modified independence Comments: increased L sided back and L ASIS /hip pain with this   TODAY'S TREATMENT:          [facility] Adult PT Treatment:                                                DATE: 10/19/22 Therapeutic Exercise: Piriformis stretch push/pull 3 x 30 sec each Side clamshell with green 2 x 15 Bridge with green at knees 2 x  10 Manual: Skilled palpation and monitoring of muscle tension while performing TPDN STM/TPR right piriformis, glute max and med Trigger Point Dry Needling Treatment: Pre-treatment instruction: Patient instructed on dry needling rationale, procedures, and possible side effects including pain during treatment (achy,cramping feeling), bruising, drop of blood, lightheadedness, nausea, sweating. Patient Consent Given: Yes Education handout provided: No Muscles treated: Right piriformis, glute max and med Needle size and number: .30x71mm x 3 Electrical stimulation performed: No Parameters: N/A Treatment response/outcome: Twitch response elicited and Palpable decrease in muscle tension Post-treatment instructions: Patient instructed to expect possible mild to moderate muscle soreness later today and/or tomorrow. Patient instructed in methods to reduce muscle soreness and to continue prescribed HEP. If patient was dry needled over the lung field, patient was instructed on signs and symptoms of pneumothorax and, however unlikely, to see immediate medical attention should they occur. Patient was also educated on signs and symptoms of infection and to seek medical attention should they occur. Patient verbalized understanding of these instructions and education.   [facility] Adult PT Treatment:                                                DATE: 10/17/22 Therapeutic Exercise: Treadmill 1.8 mph for 5 min , increased pain in Hips 5/10 Knees to chest double and single 2-3 each side  Physio ball: hamstring curl, bridge and oblique rotation  Book opening x 5  Sidelying hip abduction x 15  Seated piriformis stretching figure 4 modified  Hip adductor stretch at sink Step ups 6 inch x 15 each side  Stagger stance hip hinge no wgt (15 lbs ), difficulty with Rt leg, spasm Sink stretch x 5  [facility]  Adult PT Treatment:                                                DATE: 10/12/22 Therapeutic Exercise: Nustep 6 min L 4 UE/LE  Mat/hip stretching: Knees crossed with rotation, piriformis  Post/anterior pelvic tilt x 10  Knee/s to chest  LTR x 10  Manual Therapy: PROM Rt hip  LAD Rt LE x 5  Prone/sidelying trigger point work to Rt hip, glute  Modalities: Cold pack 10 min prone  Self Care: Dry needling, plan of care   [facility] Adult PT Treatment:                                                DATE: 10/09/22 Therapeutic Exercise: TM up to 1.6 mph for 5 min , pain increased to 5/10 on L hip and 4/10 Rt hip Lower trunk rotation x 5  Single and then double knee chest  Bridge with green band x 10 Bridge with hips ER/IR green band x 10  SLR with Pilates circle on opposite thigh x 10  Sidelying green band clam x 10  Standing Hip abduction x 10 Self Care: Tennis ball on the wall for post hip  Dry needling   [facility] Adult PT Treatment:                                                DATE: 10/05/22 Therapeutic Exercise: Recumbent bike L2 for 6 min Sit to stand 15 lbs x 15  Squat x 15 lbs x 15  2 min walk to assess pain, pain began in L post thigh, minimal (2/10) Step ups 6 inch x 15 , 2 sets with and without weight (15 lbs)  Calf stretch at the wall  Supine hamstring/ITB stretch x 3 with sheet Knees crossed with LTR x 5  Manual Therapy: Long axis distraction x 3 to each leg, 30 seconds each at varying angles  [facility] [place] Adult PT Treatment:                                                DATE: 10/03/22 Therapeutic Exercise: NuStep L5 UE and LE for 6 min  Knee to chest  Lower trunk rotation arms out x 10 Hamstring stretch Supine physioball : hamstring curls, bridge x 15  Core press x 5 SLR x 10 with core focus  Supine arm arcs x 10 with alt knee lift green band  Standing hip flexor stretch x 3 each side  Standing adductor stretch x 3    [facility] Adult PT Treatment:                                                 DATE: 09/26/22 Therapeutic Exercise: NuStep LE and UE for 6 min, L 5 Bridges x 15  LTR x 10 Knee to chest x 3, then 30 sec to opposite shoulder  Hamstring seated 30 sec x 2  Sit to stand 15 lbs x 10 x 2 Suitcase carry 15 lbs each arm x 180 feet  Standing core tandem, KB pass 30 sec each  Knee extension 25 lbs x 10 x 2  Hamstring 35 lbs 2 x 10  Leg press 2 plates x 15  Self Care: Gym memberships, [employer] and form with gym machines     [facility] Adult PT Treatment:                                                DATE: 09/19/22 Therapeutic Exercise: NuStep L 6 UE and LE for 6 min  Supine knee to chest Supine knee to opp shoulder Supported knee extension/hamstring with towel under thigh x 10 each LE  Lower trunk rotation x 10  Wide knee LTR x 10 (Internal rotation) Glute Bridge with blue band around thighs  Sit to stand 15 lbs x 10  Squat 15 lbs x 10  Dead lift x 15 lbs x 10  Modified hip hinge due to back pain, no wgt hands on thighs. Min increase in back pain .   [facility] Adult PT Treatment:                                                DATE: 09/10/22 Therapeutic Exercise: Nustep L5 for 6 min UE and LE  Standing ant hip stretch 2 x 30  Supine knee to chest with hip circles (passive using UEs )  Rt ant hip stretch off table  Seated Hamstring Stretch x 2 , 30 sec  Supine Bridge  x 10, 5 set Supine Lower Trunk Rotation  x 10  Double knee to chest x 30 sec  Quadruped rocking for hip opening to child's pose  Manual Therapy: PROM hips and assessment of lumbar spasm, paraspinals and glutes  PT Eval: 09/06/22 PT eval and assessment, differential diagnosis     PATIENT EDUCATION:  Education details: TPDN treatment, HEP Person educated: Patient Education method: Explanation, Demonstration, and Handouts Education comprehension: verbalized understanding  HOME EXERCISE PROGRAM:  Access Code: [email or web address] URL: [email or web address] Date:  10/03/2022 Prepared by: Karie Mainland  Exercises - Seated Hamstring Stretch  - 1-2 x daily - 7 x weekly - 1 sets - 3 reps - 30 hold - Supine Lower Trunk Rotation  - 2 x daily - 7 x weekly - 2 sets - 10 reps - Supine Bridge  - 2 x daily - 7 x weekly - 2 sets - 10 reps - Hooklying Single Knee to Chest Stretch  - 1 x daily - 7 x weekly - 1 sets - 3 reps - 30 hold - Cat to Child's Pose with Posterior Pelvic Tilt  - 1 x daily - 7 x weekly - 1 sets - 5-10 reps - 10-30 hold - Supine ASLR with Ab Bracing and Shoulder Extension with Anchored Resistance  - 1 x daily - 7 x weekly - 2 sets - 10 reps - 5 hold Added side lying clam   ASSESSMENT:  CLINICAL IMPRESSION: Patient tolerated therapy well with no adverse effects. Performed TPDN to the right  posterior and lateral hip region this visit. He demonstrated primary tenderness and muscle tension along right piriformis and glute med, as well as left glute med but can address the left side in a future session as needed. Therapy focused on stretching for the right piriformis and hip region, and strengthening for the glutes with good tolerance. He did report post-session tenderness of the right hip as expected following TPDN. No changes made to HEP this visit. Patient would benefit from continued skilled PT to progress his mobility and strength in order to reduce pain and maximize functional ability.   OBJECTIVE IMPAIRMENTS: decreased activity tolerance, decreased mobility, difficulty walking, decreased ROM, hypomobility, increased fascial restrictions, impaired flexibility, improper body mechanics, postural dysfunction, and pain.   ACTIVITY LIMITATIONS: standing and locomotion level  PARTICIPATION LIMITATIONS: shopping and community activity  PERSONAL FACTORS: Past/current experiences, Time since onset of injury/illness/exacerbation, and 3+ comorbidities: previous back surgery, Rt knee, Rt hip replacement   are also affecting patient's functional outcome.    REHAB POTENTIAL: Good  CLINICAL DECISION MAKING: Evolving/moderate complexity  EVALUATION COMPLEXITY: Moderate   GOALS: Goals reviewed with patient? Yes  SHORT TERM GOALS: Target date: 09/27/2022   Pt will be I with HEP for core and lifting, hips  Baseline: unknown, does have HEP from previous PT, does occasionally  Goal status: Met   2.  Pt will be able to walk on level surface for 5 min with pain < 5/10 in hips  Baseline: 2 min , increased pain L hip and back (4/10)  Goal status:MET   LONG TERM GOALS: Target date: 11/01/2022    Pt will improve FOTO score to 61% or better to demo improved functional mobility  Baseline: 50% Goal status: INITIAL  2.  Pt will be able to walk in the grocery store with overall min increased in hip pain using the cart for 30 min .  Baseline: 15 min pain can be 9/10-10/10 Goal status: INITIAL  3.  Pt will be able to show I with final HEP  Baseline: unknown  Goal status: INITIAL  4.  Pt will be able to show proper squat and lifting to preserve back with functional activities at home  Baseline: needs cues  Goal status: INITIAL  5.  Pt will be able to perform a plank for 30 sec (modified if needed) without increased back pain  Baseline: NT on eval  Goal status: INITIAL   PLAN:  PT FREQUENCY: 2x/week  PT DURATION: 8 weeks 6-8 weeks   PLANNED INTERVENTIONS: Therapeutic exercises, Therapeutic activity, Neuromuscular re-education, Patient/Family education, Self Care, Joint mobilization, Spinal mobilization, Cryotherapy, Moist heat, Manual therapy, and Re-evaluation  PLAN FOR NEXT SESSION:  Dry needling to post hip (s).  Cont hip mobility, core strength as tolerated. Squat/lift/hinge/ plank   Rosana Hoes, PT, DPT, LAT, ATC 10/19/22  9:55 AM Phone: [phone] Fax: [phone]

## 2022-10-19 ENCOUNTER — Encounter: Payer: Self-pay | Admitting: Physical Therapy

## 2022-10-19 ENCOUNTER — Other Ambulatory Visit: Payer: Self-pay

## 2022-10-19 ENCOUNTER — Ambulatory Visit: Payer: Medicare Other | Admitting: Physical Therapy

## 2022-10-19 DIAGNOSIS — M62830 Muscle spasm of back: Secondary | ICD-10-CM

## 2022-10-19 DIAGNOSIS — M6281 Muscle weakness (generalized): Secondary | ICD-10-CM

## 2022-10-19 DIAGNOSIS — M25552 Pain in left hip: Secondary | ICD-10-CM

## 2022-10-19 DIAGNOSIS — R262 Difficulty in walking, not elsewhere classified: Secondary | ICD-10-CM | POA: Diagnosis not present

## 2022-10-19 DIAGNOSIS — M25551 Pain in right hip: Secondary | ICD-10-CM

## 2022-10-23 NOTE — Therapy (Signed)
OUTPATIENT PHYSICAL THERAPY TREATMENT   Patient Name: Gerald Carpenter MRN: [id number] DOB:July 28, 1950, 72 y.o., male Today's Date: 10/24/2022   END OF SESSION:  PT End of Session - 10/24/22 0932     Visit Number 11    Number of Visits 16    Date for PT Re-Evaluation 11/01/22    Authorization Type MCR/[employer]    PT Start Time 0931    PT Stop Time 1019    PT Time Calculation (min) 48 min    Activity Tolerance Patient tolerated treatment well    Behavior During Therapy WFL for tasks assessed/performed                       Past Medical History:  Diagnosis Date   Alcoholism (HCC)    Arthritis    Cataract    Colon polyps    Complication of anesthesia    HLD (hyperlipidemia)    Nephritis    when pt. was 7 yrs. ols   Peripheral vascular disease (HCC)    PONV (postoperative nausea and vomiting)    "Only when I have aortagram on 06/23/20".   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   Substance abuse [facility])    Past Surgical History:  Procedure Laterality Date   ABDOMINAL AORTOGRAM W/LOWER EXTREMITY N/A 06/23/2020   Procedure: ABDOMINAL AORTOGRAM W/LOWER EXTREMITY;  Surgeon: Cephus Shelling, MD;  Location: [facility];  Service: Cardiovascular;  Laterality: N/A;   ANTERIOR LAT LUMBAR FUSION Right 02/05/2019   Procedure: ATTEMPTED ANTERIOR LATERAL INTERBODY FUSION;  Surgeon: Lisbeth Renshaw, MD;  Location: [facility] OR;  Service: Neurosurgery;  Laterality: Right;  RIGHT LATERAL TRANSPSOAS DISCECTOMY AND FUSION WITH ROBOTIC ASSISTED PEDICLE SCREW STABILIZATION, LUMBAR 4- LUMBAR 5   CATARACT EXTRACTION W/ INTRAOCULAR LENS  IMPLANT, BILATERAL Bilateral 2013   lens implants for cataracts after Lasik   COLONOSCOPY     ENDOVEIN HARVEST OF GREATER SAPHENOUS VEIN Left 06/29/2020   Procedure: HARVEST OF LEFT GREATER SAPHENOUS VEIN;  Surgeon: Cephus Shelling, MD;  Location: [facility] OR;  Service: Vascular;  Laterality: Left;   FEMORAL-POPLITEAL BYPASS GRAFT Left  06/29/2020   Procedure: BYPASS GRAFT LEFT COMMON FEMORAL TO ABOVE KNEE POPLITEAL ARTERY;  Surgeon: Cephus Shelling, MD;  Location: [facility];  Service: Vascular;  Laterality: Left;   HEMORRHOID SURGERY N/A 04/06/2021   Procedure: HEMORRHOIDECTOMY 2 COLUMN;  Surgeon: Andria Meuse, MD;  Location: [facility];  Service: General;  Laterality: N/A;   KNEE ARTHROSCOPY Right 1980   RECTAL EXAM UNDER ANESTHESIA N/A 04/06/2021   Procedure: ANORECTAL EXAM UNDER ANESTHESIA/ excision of anal polyp;  Surgeon: Andria Meuse, MD;  Location: [facility];  Service: General;  Laterality: N/A;   TONSILLECTOMY     at a very young age, but grew back   TOTAL HIP ARTHROPLASTY Right 05/25/2019   Procedure: RIGHT TOTAL HIP ARTHROPLASTY ANTERIOR APPROACH;  Surgeon: Tarry Kos, MD;  Location: [facility];  Service: Orthopedics;  Laterality: Right;   TOTAL KNEE ARTHROPLASTY Right 09/04/2021   Procedure: RIGHT TOTAL KNEE ARTHROPLASTY;  Surgeon: Tarry Kos, MD;  Location: [facility];  Service: Orthopedics;  Laterality: Right;   Patient Active Problem List   Diagnosis Date Noted   Status post total right knee replacement 09/04/2021   Leukocytosis 10/11/2020   PAD (peripheral artery disease) (HCC) 06/29/2020   Prediabetes 04/22/2020   Chronic kidney disease, stage 3a (HCC) 04/15/2020   Severe tobacco dependence 04/12/2020   Lower  urinary tract symptoms (LUTS) 04/12/2020   History of colon polyps 04/12/2020   Atherosclerosis of native arteries of extremity with intermittent claudication (HCC) 03/01/2020   Pain in both feet 07/31/2019   Status post total replacement of right hip 05/25/2019   Lumbar radiculopathy 02/05/2019    PCP: Loyola Mast MD  REFERRING PROVIDER: Horton Chin, MD  REFERRING DIAG: [id number] (ICD-10-CM) - Bilateral hip pain  THERAPY DIAG:  Difficulty in walking, not elsewhere classified  Pain in right hip  Pain in left hip  Muscle  weakness (generalized)  Muscle spasm of back  Rationale for Evaluation and Treatment: Rehabilitation  ONSET DATE: chronic   SUBJECTIVE:   SUBJECTIVE STATEMENT: It got worse, is 4/10-5/10 now, was up to 7/10 over the past few days.  Worse at night.  See Dr. Carlis Abbott Monday.     PERTINENT HISTORY: Rt THA 2021, R TKA 2023, lumbar fusion (L4-L5) 2020 PAIN:  Are you having pain? Yes: NPRS scale: 2/10 Pain location: Rt buttock   Pain description: deep ache, sharp   Aggravating factors: walking  Relieving factors: standing , leaning , holding on to something, stopping  Pain goes to 10/10 with walking 10 min   Back is tender this AM 10/12/22 : Yesterday it was 6/10-7/10.   PRECAUTIONS: None  RED FLAGS: None   WEIGHT BEARING RESTRICTIONS: No  FALLS:  Has patient fallen in last 6 months? No  LIVING ENVIRONMENT: Lives with: lives with their spouse Lives in: House/apartment Stairs: Yes: Internal: 12 steps; on right going up Has following equipment at home: None has a cane but does not use it   OCCUPATION: Retired, [employer]   PLOF: Independent  PATIENT GOALS: Patient wants to walk without pain   NEXT MD VISIT: as needed   OBJECTIVE:   DIAGNOSTIC FINDINGS:  08/2022 He does have moderate  degenerative changes to the left hip joint  Rt hip prosthesis in place.  IMPRESSION: Lumbar  1. Status post posterior instrumented fusion and decompression at L4-L5 without residual spinal canal or neural foraminal stenosis. 2. Adjacent segment disease at L3-L4 with trace retrolisthesis, disc bulge, and bilateral facet arthropathy resulting in mild spinal canal and bilateral subarticular zone without evidence of frank nerve root impingement, and mild left worse than right neural foraminal stenosis. 3. Mild left worse than right subarticular zone narrowing L2-L3.    PATIENT SURVEYS:  FOTO 50%  10/12/22 FOTO 51%   COGNITION: Overall cognitive status: Within functional limits  for tasks assessed     SENSATION: WFL   MUSCLE LENGTH: Hamstrings: tight  Thomas test: neg Quads: tight in prone   POSTURE: increased thoracic kyphosis, flexed trunk , and Low Rt shoulder   PALPATION: TTP  LUMBAR ROM:   Active  A/PROM  eval  Flexion Distal shin Hamstring tightness   Extension 50%  Right lateral flexion 50%  Left lateral flexion 50%  Right rotation 50%  Left rotation 50%  Stretch, no pain    LOWER EXTREMITY ROM:  Passive ROM Right eval Left eval  Hip flexion WFL, min ant hip pain  WFL  Hip extension    Hip abduction    Hip adduction    Hip internal rotation Min tight WFL  Hip external rotation Min tight  WFL   Knee flexion [facility]  Knee extension [facility]  Ankle dorsiflexion    Ankle plantarflexion    Ankle inversion    Ankle eversion     (Blank rows = not tested)  LOWER EXTREMITY MMT:  MMT Right eval Left eval Rt./Lt.  09/10/22  Hip flexion  4 4   Hip extension   4+/5  Hip abduction   4+/5   Hip adduction     Hip internal rotation     Hip external rotation     Knee flexion 5 5   Knee extension 5 5   Ankle dorsiflexion     Ankle plantarflexion     Ankle inversion     Ankle eversion      (Blank rows = not tested)  LOWER EXTREMITY SPECIAL TESTS:  Hip special tests: Luisa Hart (FABER) test: negative, Trendelenburg test: negative, and Thomas test: negative  FUNCTIONAL TESTS:  5 times sit to stand: 10. 9 sec  2 minute walk test: 449 feet, pain L side 2/10.     GAIT: Distance walked: 449 feet , pain increased   Assistive device utilized: None Level of assistance: Modified independence Comments: increased L sided back and L ASIS /hip pain with this   TODAY'S TREATMENT:           [facility] Adult PT Treatment:                                                DATE: 10/24/22 Therapeutic Exercise: Supine LTR feet wide  Knee to opp shoulder 30 sec x 3 Rt >Lt.  Hamstring 30 sec x 2 each side  Piriformis Rt hip, knee pressing away x  5 Quadruped cat to neutral to child's pose for 1 min  Bird dog x 10 each side pain on knee (TKA) Modified to high plank on elevated mat table Shoulder tap Rt hand limitations  Downward dog on high mat Low plank mtn climbers x 8 slow  Modalities: IFC with MHP to Rt post hip 15 min in hook lying  Self Care: TENS unit, cont POC, core     [facility] Adult PT Treatment:                                                DATE: 10/19/22 Therapeutic Exercise: Piriformis stretch push/pull 3 x 30 sec each Side clamshell with green 2 x 15 Bridge with green at knees 2 x 10 Manual: Skilled palpation and monitoring of muscle tension while performing TPDN STM/TPR right piriformis, glute max and med Trigger Point Dry Needling Treatment: Pre-treatment instruction: Patient instructed on dry needling rationale, procedures, and possible side effects including pain during treatment (achy,cramping feeling), bruising, drop of blood, lightheadedness, nausea, sweating. Patient Consent Given: Yes Education handout provided: No Muscles treated: Right piriformis, glute max and med Needle size and number: .30x20mm x 3 Electrical stimulation performed: No Parameters: N/A Treatment response/outcome: Twitch response elicited and Palpable decrease in muscle tension Post-treatment instructions: Patient instructed to expect possible mild to moderate muscle soreness later today and/or tomorrow. Patient instructed in methods to reduce muscle soreness and to continue prescribed HEP. If patient was dry needled over the lung field, patient was instructed on signs and symptoms of pneumothorax and, however unlikely, to see immediate medical attention should they occur. Patient was also educated on signs and symptoms of infection and to seek medical attention should they occur. Patient verbalized understanding of these instructions and education.  [facility] Adult PT Treatment:                                                DATE:  10/17/22 Therapeutic Exercise: Treadmill 1.8 mph for 5 min , increased pain in Hips 5/10 Knees to chest double and single 2-3 each side  Physio ball: hamstring curl, bridge and oblique rotation  Book opening x 5  Sidelying hip abduction x 15  Seated piriformis stretching figure 4 modified  Hip adductor stretch at sink Step ups 6 inch x 15 each side  Stagger stance hip hinge no wgt (15 lbs ), difficulty with Rt leg, spasm Sink stretch x 5                                                                                                                        [facility] Adult PT Treatment:                                                DATE: 10/12/22 Therapeutic Exercise: Nustep 6 min L 4 UE/LE  Mat/hip stretching: Knees crossed with rotation, piriformis  Post/anterior pelvic tilt x 10  Knee/s to chest  LTR x 10  Manual Therapy: PROM Rt hip  LAD Rt LE x 5  Prone/sidelying trigger point work to Rt hip, glute  Modalities: Cold pack 10 min prone  Self Care: Dry needling, plan of care   [facility] Adult PT Treatment:                                                DATE: 10/09/22 Therapeutic Exercise: TM up to 1.6 mph for 5 min , pain increased to 5/10 on L hip and 4/10 Rt hip Lower trunk rotation x 5  Single and then double knee chest  Bridge with green band x 10 Bridge with hips ER/IR green band x 10  SLR with Pilates circle on opposite thigh x 10  Sidelying green band clam x 10  Standing Hip abduction x 10 Self Care: Tennis ball on the wall for post hip  Dry needling   [facility] Adult PT Treatment:                                                DATE: 10/05/22 Therapeutic Exercise: Recumbent bike L2 for 6 min Sit to stand 15 lbs x 15  Squat x 15 lbs x 15  2 min walk to assess pain, pain  began in L post thigh, minimal (2/10) Step ups 6 inch x 15 , 2 sets with and without weight (15 lbs)  Calf stretch at the wall  Supine hamstring/ITB stretch x 3 with sheet Knees crossed with LTR x 5  Manual  Therapy: Long axis distraction x 3 to each leg, 30 seconds each at varying angles  [facility] Adult PT Treatment:                                                DATE: 10/03/22 Therapeutic Exercise: NuStep L5 UE and LE for 6 min  Knee to chest  Lower trunk rotation arms out x 10 Hamstring stretch Supine physioball : hamstring curls, bridge x 15  Core press x 5 SLR x 10 with core focus  Supine arm arcs x 10 with alt knee lift green band  Standing hip flexor stretch x 3 each side  Standing adductor stretch x 3      PATIENT EDUCATION:  Education details: home TENS POC  Person educated: Patient Education method: Explanation, Facilities manager, and Handouts Education comprehension: verbalized understanding  HOME EXERCISE PROGRAM:  Access Code: [email or web address] URL: [email or web address] Date: 10/03/2022 Prepared by: Karie Mainland  Exercises - Seated Hamstring Stretch  - 1-2 x daily - 7 x weekly - 1 sets - 3 reps - 30 hold - Supine Lower Trunk Rotation  - 2 x daily - 7 x weekly - 2 sets - 10 reps - Supine Bridge  - 2 x daily - 7 x weekly - 2 sets - 10 reps - Hooklying Single Knee to Chest Stretch  - 1 x daily - 7 x weekly - 1 sets - 3 reps - 30 hold - Cat to Child's Pose with Posterior Pelvic Tilt  - 1 x daily - 7 x weekly - 1 sets - 5-10 reps - 10-30 hold - Supine ASLR with Ab Bracing and Shoulder Extension with Anchored Resistance  - 1 x daily - 7 x weekly - 2 sets - 10 reps - 5 hold Added side lying clam   ASSESSMENT:  CLINICAL IMPRESSION: Patient with increased pain following his trigger point dry needling.  He will see his MD next week and will seek further treatment.  He has had injections in the past, epidural injection did not work. He had a steroid in the past but not sure what it was.  Trial of IFC, TENS to see if that may be of assistance for him more long term. Needed to modify exercises due to hand and knee discomfort.   OBJECTIVE IMPAIRMENTS: decreased activity  tolerance, decreased mobility, difficulty walking, decreased ROM, hypomobility, increased fascial restrictions, impaired flexibility, improper body mechanics, postural dysfunction, and pain.   ACTIVITY LIMITATIONS: standing and locomotion level  PARTICIPATION LIMITATIONS: shopping and community activity  PERSONAL FACTORS: Past/current experiences, Time since onset of injury/illness/exacerbation, and 3+ comorbidities: previous back surgery, Rt knee, Rt hip replacement   are also affecting patient's functional outcome.   REHAB POTENTIAL: Good  CLINICAL DECISION MAKING: Evolving/moderate complexity  EVALUATION COMPLEXITY: Moderate   GOALS: Goals reviewed with patient? Yes  SHORT TERM GOALS: Target date: 09/27/2022   Pt will be I with HEP for core and lifting, hips  Baseline: unknown, does have HEP from previous PT, does occasionally  Goal status: Met   2.  Pt will be able to walk on level surface  for 5 min with pain < 5/10 in hips  Baseline: 2 min , increased pain L hip and back (4/10)  Goal status:MET   LONG TERM GOALS: Target date: 11/01/2022    Pt will improve FOTO score to 61% or better to demo improved functional mobility  Baseline: 50% Goal status: INITIAL  2.  Pt will be able to walk in the grocery store with overall min increased in hip pain using the cart for 30 min .  Baseline: 15 min pain can be 9/10-10/10 Goal status: INITIAL  3.  Pt will be able to show I with final HEP  Baseline: unknown  Goal status: INITIAL  4.  Pt will be able to show proper squat and lifting to preserve back with functional activities at home  Baseline: needs cues  Goal status: INITIAL  5.  Pt will be able to perform a plank for 30 sec (modified if needed) without increased back pain  Baseline: NT on eval  Goal status: INITIAL   PLAN:  PT FREQUENCY: 2x/week  PT DURATION: 8 weeks 6-8 weeks   PLANNED INTERVENTIONS: Therapeutic exercises, Therapeutic activity, Neuromuscular  re-education, Patient/Family education, Self Care, Joint mobilization, Spinal mobilization, Cryotherapy, Moist heat, Manual therapy, and Re-evaluation  PLAN FOR NEXT SESSION:  Cont hip mobility, core strength as tolerated. Squat/lift/hinge/ plank   Karie Mainland, PT 10/24/22 10:08 AM Phone: [phone] Fax: [phone]

## 2022-10-24 ENCOUNTER — Encounter: Payer: Self-pay | Admitting: Physical Therapy

## 2022-10-24 ENCOUNTER — Ambulatory Visit: Payer: Medicare Other | Attending: Physical Medicine and Rehabilitation | Admitting: Physical Therapy

## 2022-10-24 DIAGNOSIS — M6281 Muscle weakness (generalized): Secondary | ICD-10-CM | POA: Insufficient documentation

## 2022-10-24 DIAGNOSIS — R262 Difficulty in walking, not elsewhere classified: Secondary | ICD-10-CM | POA: Diagnosis present

## 2022-10-24 DIAGNOSIS — M62830 Muscle spasm of back: Secondary | ICD-10-CM | POA: Diagnosis present

## 2022-10-24 DIAGNOSIS — M25552 Pain in left hip: Secondary | ICD-10-CM | POA: Insufficient documentation

## 2022-10-24 DIAGNOSIS — M25551 Pain in right hip: Secondary | ICD-10-CM | POA: Diagnosis present

## 2022-10-24 NOTE — Patient Instructions (Signed)

## 2022-10-25 NOTE — Therapy (Signed)
OUTPATIENT PHYSICAL THERAPY TREATMENT   Patient Name: Gerald Carpenter MRN: [id number] DOB:1950/08/21, 72 y.o., male Today's Date: 10/26/2022   END OF SESSION:  PT End of Session - 10/26/22 0741     Visit Number 12    Number of Visits 16    Date for PT Re-Evaluation 11/01/22    Authorization Type MCR/[employer]    PT Start Time 0800    PT Stop Time 0900    PT Time Calculation (min) 60 min    Activity Tolerance Patient tolerated treatment well    Behavior During Therapy WFL for tasks assessed/performed                        Past Medical History:  Diagnosis Date   Alcoholism (HCC)    Arthritis    Cataract    Colon polyps    Complication of anesthesia    HLD (hyperlipidemia)    Nephritis    when pt. was 7 yrs. ols   Peripheral vascular disease (HCC)    PONV (postoperative nausea and vomiting)    "Only when I have aortagram on 06/23/20".   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   Substance abuse [facility])    Past Surgical History:  Procedure Laterality Date   ABDOMINAL AORTOGRAM W/LOWER EXTREMITY N/A 06/23/2020   Procedure: ABDOMINAL AORTOGRAM W/LOWER EXTREMITY;  Surgeon: Cephus Shelling, MD;  Location: [facility];  Service: Cardiovascular;  Laterality: N/A;   ANTERIOR LAT LUMBAR FUSION Right 02/05/2019   Procedure: ATTEMPTED ANTERIOR LATERAL INTERBODY FUSION;  Surgeon: Lisbeth Renshaw, MD;  Location: [facility] OR;  Service: Neurosurgery;  Laterality: Right;  RIGHT LATERAL TRANSPSOAS DISCECTOMY AND FUSION WITH ROBOTIC ASSISTED PEDICLE SCREW STABILIZATION, LUMBAR 4- LUMBAR 5   CATARACT EXTRACTION W/ INTRAOCULAR LENS  IMPLANT, BILATERAL Bilateral 2013   lens implants for cataracts after Lasik   COLONOSCOPY     ENDOVEIN HARVEST OF GREATER SAPHENOUS VEIN Left 06/29/2020   Procedure: HARVEST OF LEFT GREATER SAPHENOUS VEIN;  Surgeon: Cephus Shelling, MD;  Location: [facility] OR;  Service: Vascular;  Laterality: Left;   FEMORAL-POPLITEAL BYPASS GRAFT Left  06/29/2020   Procedure: BYPASS GRAFT LEFT COMMON FEMORAL TO ABOVE KNEE POPLITEAL ARTERY;  Surgeon: Cephus Shelling, MD;  Location: [facility];  Service: Vascular;  Laterality: Left;   HEMORRHOID SURGERY N/A 04/06/2021   Procedure: HEMORRHOIDECTOMY 2 COLUMN;  Surgeon: Andria Meuse, MD;  Location: [facility];  Service: General;  Laterality: N/A;   KNEE ARTHROSCOPY Right 1980   RECTAL EXAM UNDER ANESTHESIA N/A 04/06/2021   Procedure: ANORECTAL EXAM UNDER ANESTHESIA/ excision of anal polyp;  Surgeon: Andria Meuse, MD;  Location: [facility];  Service: General;  Laterality: N/A;   TONSILLECTOMY     at a very young age, but grew back   TOTAL HIP ARTHROPLASTY Right 05/25/2019   Procedure: RIGHT TOTAL HIP ARTHROPLASTY ANTERIOR APPROACH;  Surgeon: Tarry Kos, MD;  Location: [facility];  Service: Orthopedics;  Laterality: Right;   TOTAL KNEE ARTHROPLASTY Right 09/04/2021   Procedure: RIGHT TOTAL KNEE ARTHROPLASTY;  Surgeon: Tarry Kos, MD;  Location: [facility];  Service: Orthopedics;  Laterality: Right;   Patient Active Problem List   Diagnosis Date Noted   Status post total right knee replacement 09/04/2021   Leukocytosis 10/11/2020   PAD (peripheral artery disease) (HCC) 06/29/2020   Prediabetes 04/22/2020   Chronic kidney disease, stage 3a (HCC) 04/15/2020   Severe tobacco dependence 04/12/2020  Lower urinary tract symptoms (LUTS) 04/12/2020   History of colon polyps 04/12/2020   Atherosclerosis of native arteries of extremity with intermittent claudication (HCC) 03/01/2020   Pain in both feet 07/31/2019   Status post total replacement of right hip 05/25/2019   Lumbar radiculopathy 02/05/2019    PCP: Loyola Mast MD  REFERRING PROVIDER: Horton Chin, MD  REFERRING DIAG: [phone] (ICD-10-CM) - Bilateral hip pain  THERAPY DIAG:  Difficulty in walking, not elsewhere classified  Pain in right hip  Pain in left hip  Muscle  weakness (generalized)  Muscle spasm of back  Rationale for Evaluation and Treatment: Rehabilitation  ONSET DATE: chronic   SUBJECTIVE:   SUBJECTIVE STATEMENT: It is OK, the TENS relaxed it a bit. It is about a 0.5 /10.    Sees Dr. Carlis Abbott Monday.     PERTINENT HISTORY: Rt THA 2021, R TKA 2023, lumbar fusion (L4-L5) 2020 PAIN:  Are you having pain? Yes: NPRS scale: .5/10 Pain location: Rt buttock   Pain description: deep ache, sharp   Aggravating factors: walking  Relieving factors: standing , leaning , holding on to something, stopping  Pain goes to 10/10 with walking 10 min   Back is tender this AM 10/12/22 : Yesterday it was 6/10-7/10.   PRECAUTIONS: None  RED FLAGS: None   WEIGHT BEARING RESTRICTIONS: No  FALLS:  Has patient fallen in last 6 months? No  LIVING ENVIRONMENT: Lives with: lives with their spouse Lives in: House/apartment Stairs: Yes: Internal: 12 steps; on right going up Has following equipment at home: None has a cane but does not use it   OCCUPATION: Retired, [employer]   PLOF: Independent  PATIENT GOALS: Patient wants to walk without pain   NEXT MD VISIT: as needed   OBJECTIVE:   DIAGNOSTIC FINDINGS:  08/2022 He does have moderate  degenerative changes to the left hip joint  Rt hip prosthesis in place.  IMPRESSION: Lumbar  1. Status post posterior instrumented fusion and decompression at L4-L5 without residual spinal canal or neural foraminal stenosis. 2. Adjacent segment disease at L3-L4 with trace retrolisthesis, disc bulge, and bilateral facet arthropathy resulting in mild spinal canal and bilateral subarticular zone without evidence of frank nerve root impingement, and mild left worse than right neural foraminal stenosis. 3. Mild left worse than right subarticular zone narrowing L2-L3.    PATIENT SURVEYS:  FOTO 50%  10/12/22 FOTO 51%   COGNITION: Overall cognitive status: Within functional limits for tasks  assessed     SENSATION: WFL   MUSCLE LENGTH: Hamstrings: tight  Thomas test: neg Quads: tight in prone   POSTURE: increased thoracic kyphosis, flexed trunk , and Low Rt shoulder   PALPATION: TTP  LUMBAR ROM:   Active  A/PROM  eval  Flexion Distal shin Hamstring tightness   Extension 50%  Right lateral flexion 50%  Left lateral flexion 50%  Right rotation 50%  Left rotation 50%  Stretch, no pain    LOWER EXTREMITY ROM:  Passive ROM Right eval Left eval  Hip flexion WFL, min ant hip pain  WFL  Hip extension    Hip abduction    Hip adduction    Hip internal rotation Min tight WFL  Hip external rotation Min tight  WFL   Knee flexion [facility]  Knee extension [facility]  Ankle dorsiflexion    Ankle plantarflexion    Ankle inversion    Ankle eversion     (Blank rows = not tested)  LOWER  EXTREMITY MMT:  MMT Right eval Left eval Rt./Lt.  09/10/22  Hip flexion  4 4   Hip extension   4+/5  Hip abduction   4+/5   Hip adduction     Hip internal rotation     Hip external rotation     Knee flexion 5 5   Knee extension 5 5   Ankle dorsiflexion     Ankle plantarflexion     Ankle inversion     Ankle eversion      (Blank rows = not tested)  LOWER EXTREMITY SPECIAL TESTS:  Hip special tests: Luisa Hart (FABER) test: negative, Trendelenburg test: negative, and Thomas test: negative  FUNCTIONAL TESTS:  5 times sit to stand: 10. 9 sec  2 minute walk test: 449 feet, pain L side 2/10.     GAIT: Distance walked: 449 feet , pain increased   Assistive device utilized: None Level of assistance: Modified independence Comments: increased L sided back and L ASIS /hip pain with this   TODAY'S TREATMENT:            [facility] Adult PT Treatment:                                                DATE: 10/26/22 Therapeutic Exercise: TM 1.5 mph, 5 min , pain increased to 7/10 Supine knee to chest and double knee to chest x 2 each LE Supine banded bridge green x 15  Bridge with  march green band x 15  Bridge with clam green band x 15  Standing: hip extension at countertop 3 lbs x 15 each side  Hip abduction 3 lbs x 15 each Palloff yellow spring at springboard (back pain 7/10) x  10    [facility] Adult PT Treatment:                                                DATE: 10/24/22 Therapeutic Exercise: Supine LTR feet wide  Knee to opp shoulder 30 sec x 3 Rt >Lt.  Hamstring 30 sec x 2 each side  Piriformis Rt hip, knee pressing away x 5 Quadruped cat to neutral to child's pose for 1 min  Bird dog x 10 each side pain on knee (TKA) Modified to high plank on elevated mat table Shoulder tap Rt hand limitations  Downward dog on high mat Low plank mtn climbers x 8 slow  Modalities: IFC with MHP to Rt post hip 15 min in hook lying  Self Care: TENS unit, cont POC, core     [facility] Adult PT Treatment:                                                DATE: 10/19/22 Therapeutic Exercise: Piriformis stretch push/pull 3 x 30 sec each Side clamshell with green 2 x 15 Bridge with green at knees 2 x 10 Manual: Skilled palpation and monitoring of muscle tension while performing TPDN STM/TPR right piriformis, glute max and med Trigger Point Dry Needling Treatment: Pre-treatment instruction: Patient instructed on dry needling rationale, procedures, and possible side effects including pain during treatment (  achy,cramping feeling), bruising, drop of blood, lightheadedness, nausea, sweating. Patient Consent Given: Yes Education handout provided: No Muscles treated: Right piriformis, glute max and med Needle size and number: .30x68mm x 3 Electrical stimulation performed: No Parameters: N/A Treatment response/outcome: Twitch response elicited and Palpable decrease in muscle tension Post-treatment instructions: Patient instructed to expect possible mild to moderate muscle soreness later today and/or tomorrow. Patient instructed in methods to reduce muscle soreness and to continue prescribed  HEP. If patient was dry needled over the lung field, patient was instructed on signs and symptoms of pneumothorax and, however unlikely, to see immediate medical attention should they occur. Patient was also educated on signs and symptoms of infection and to seek medical attention should they occur. Patient verbalized understanding of these instructions and education.   [facility] Adult PT Treatment:                                                DATE: 10/17/22 Therapeutic Exercise: Treadmill 1.8 mph for 5 min , increased pain in Hips 5/10 Knees to chest double and single 2-3 each side  Physio ball: hamstring curl, bridge and oblique rotation  Book opening x 5  Sidelying hip abduction x 15  Seated piriformis stretching figure 4 modified  Hip adductor stretch at sink Step ups 6 inch x 15 each side  Stagger stance hip hinge no wgt (15 lbs ), difficulty with Rt leg, spasm Sink stretch x 5                                                                                                                        [facility] Adult PT Treatment:                                                DATE: 10/12/22 Therapeutic Exercise: Nustep 6 min L 4 UE/LE  Mat/hip stretching: Knees crossed with rotation, piriformis  Post/anterior pelvic tilt x 10  Knee/s to chest  LTR x 10  Manual Therapy: PROM Rt hip  LAD Rt LE x 5  Prone/sidelying trigger point work to Rt hip, glute  Modalities: Cold pack 10 min prone  Self Care: Dry needling, plan of care    PATIENT EDUCATION:  Education details: home TENS POC  Person educated: Patient Education method: Explanation, Demonstration, and Handouts Education comprehension: verbalized understanding  HOME EXERCISE PROGRAM:  Access Code: [email or web address] URL: [email or web address] Date: 10/03/2022 Prepared by: Karie Mainland  Exercises - Seated Hamstring Stretch  - 1-2 x daily - 7 x weekly - 1 sets - 3 reps - 30 hold - Supine Lower Trunk Rotation  - 2 x daily - 7 x  weekly - 2 sets - 10 reps - Supine Bridge  -  2 x daily - 7 x weekly - 2 sets - 10 reps - Hooklying Single Knee to Chest Stretch  - 1 x daily - 7 x weekly - 1 sets - 3 reps - 30 hold - Cat to Child's Pose with Posterior Pelvic Tilt  - 1 x daily - 7 x weekly - 1 sets - 5-10 reps - 10-30 hold - Supine ASLR with Ab Bracing and Shoulder Extension with Anchored Resistance  - 1 x daily - 7 x weekly - 2 sets - 10 reps - 5 hold Added side lying clam   ASSESSMENT:  CLINICAL IMPRESSION: Patient found benefit from IFC at the last session.  He does report and overall improvement in his ability to walk in the community.  He tolerated core exercises on the mat without increased pain. Standing does increase back pain unless he can lean on something.  Cont POC and f/u re: MD appt.   OBJECTIVE IMPAIRMENTS: decreased activity tolerance, decreased mobility, difficulty walking, decreased ROM, hypomobility, increased fascial restrictions, impaired flexibility, improper body mechanics, postural dysfunction, and pain.   ACTIVITY LIMITATIONS: standing and locomotion level  PARTICIPATION LIMITATIONS: shopping and community activity  PERSONAL FACTORS: Past/current experiences, Time since onset of injury/illness/exacerbation, and 3+ comorbidities: previous back surgery, Rt knee, Rt hip replacement   are also affecting patient's functional outcome.   REHAB POTENTIAL: Good  CLINICAL DECISION MAKING: Evolving/moderate complexity  EVALUATION COMPLEXITY: Moderate   GOALS: Goals reviewed with patient? Yes  SHORT TERM GOALS: Target date: 09/27/2022   Pt will be I with HEP for core and lifting, hips  Baseline: unknown, does have HEP from previous PT, does occasionally  Goal status: MET  2.  Pt will be able to walk on level surface for 5 min with pain < 5/10 in hips  Baseline: 2 min , increased pain L hip and back (4/10)  Goal status:MET   LONG TERM GOALS: Target date: 11/01/2022    Pt will improve FOTO score  to 61% or better to demo improved functional mobility  Baseline: 50% Goal status: INITIAL  2.  Pt will be able to walk in the grocery store with overall min increased in hip pain using the cart for 30 min .  Baseline: 15 min pain can be 9/10-10/10 Goal status: INITIAL  3.  Pt will be able to show I with final HEP  Baseline: unknown  Goal status: INITIAL  4.  Pt will be able to show proper squat and lifting to preserve back with functional activities at home  Baseline: needs cues  Goal status: INITIAL  5.  Pt will be able to perform a plank for 30 sec (modified if needed) without increased back pain  Baseline: NT on eval  Goal status: INITIAL   PLAN:  PT FREQUENCY: 2x/week  PT DURATION: 8 weeks 6-8 weeks   PLANNED INTERVENTIONS: Therapeutic exercises, Therapeutic activity, Neuromuscular re-education, Patient/Family education, Self Care, Joint mobilization, Spinal mobilization, Cryotherapy, Moist heat, Manual therapy, and Re-evaluation  PLAN FOR NEXT SESSION:  Consider renewal vs DC.  Sees MD Monday.    Karie Mainland, PT 10/26/22 9:28 AM  Phone: [phone] Fax: [phone]

## 2022-10-26 ENCOUNTER — Ambulatory Visit: Payer: Medicare Other | Admitting: Physical Therapy

## 2022-10-26 ENCOUNTER — Encounter: Payer: Self-pay | Admitting: Physical Therapy

## 2022-10-26 DIAGNOSIS — R262 Difficulty in walking, not elsewhere classified: Secondary | ICD-10-CM | POA: Diagnosis not present

## 2022-10-26 DIAGNOSIS — M6281 Muscle weakness (generalized): Secondary | ICD-10-CM

## 2022-10-26 DIAGNOSIS — M25552 Pain in left hip: Secondary | ICD-10-CM

## 2022-10-26 DIAGNOSIS — M62830 Muscle spasm of back: Secondary | ICD-10-CM

## 2022-10-26 DIAGNOSIS — M25551 Pain in right hip: Secondary | ICD-10-CM

## 2022-10-29 ENCOUNTER — Encounter: Payer: Medicare Other | Admitting: Physical Medicine and Rehabilitation

## 2022-10-30 NOTE — Therapy (Addendum)
OUTPATIENT PHYSICAL THERAPY TREATMENT DISCHARGE   Patient Name: Gerald Carpenter MRN: [id number] DOB:1950/12/13, 72 y.o., male Today's Date: 10/31/2022  PHYSICAL THERAPY DISCHARGE SUMMARY  Visits from Start of Care: 13  Current functional level related to goals / functional outcomes: See below    Remaining deficits: See below, pain continues with walking    Education / Equipment: HEP, routine, spine vs hip pain    Patient agrees to discharge. Patient goals were partially met. Patient is being discharged due to not returning since the last visit.  END OF SESSION:  PT End of Session - 10/31/22 0803     Visit Number 13    Number of Visits 16    Date for PT Re-Evaluation 11/01/22    Authorization Type MCR/[employer]    PT Start Time 0800    PT Stop Time 0845    PT Time Calculation (min) 45 min    Activity Tolerance Patient tolerated treatment well    Behavior During Therapy WFL for tasks assessed/performed                         Past Medical History:  Diagnosis Date   Alcoholism (HCC)    Arthritis    Cataract    Colon polyps    Complication of anesthesia    HLD (hyperlipidemia)    Nephritis    when pt. was 7 yrs. ols   Peripheral vascular disease (HCC)    PONV (postoperative nausea and vomiting)    "Only when I have aortagram on 06/23/20".   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   Substance abuse [facility])    Past Surgical History:  Procedure Laterality Date   ABDOMINAL AORTOGRAM W/LOWER EXTREMITY N/A 06/23/2020   Procedure: ABDOMINAL AORTOGRAM W/LOWER EXTREMITY;  Surgeon: Cephus Shelling, MD;  Location: [facility];  Service: Cardiovascular;  Laterality: N/A;   ANTERIOR LAT LUMBAR FUSION Right 02/05/2019   Procedure: ATTEMPTED ANTERIOR LATERAL INTERBODY FUSION;  Surgeon: Lisbeth Renshaw, MD;  Location: [facility] OR;  Service: Neurosurgery;  Laterality: Right;  RIGHT LATERAL TRANSPSOAS DISCECTOMY AND FUSION WITH ROBOTIC ASSISTED PEDICLE  SCREW STABILIZATION, LUMBAR 4- LUMBAR 5   CATARACT EXTRACTION W/ INTRAOCULAR LENS  IMPLANT, BILATERAL Bilateral 2013   lens implants for cataracts after Lasik   COLONOSCOPY     ENDOVEIN HARVEST OF GREATER SAPHENOUS VEIN Left 06/29/2020   Procedure: HARVEST OF LEFT GREATER SAPHENOUS VEIN;  Surgeon: Cephus Shelling, MD;  Location: [facility] OR;  Service: Vascular;  Laterality: Left;   FEMORAL-POPLITEAL BYPASS GRAFT Left 06/29/2020   Procedure: BYPASS GRAFT LEFT COMMON FEMORAL TO ABOVE KNEE POPLITEAL ARTERY;  Surgeon: Cephus Shelling, MD;  Location: [facility];  Service: Vascular;  Laterality: Left;   HEMORRHOID SURGERY N/A 04/06/2021   Procedure: HEMORRHOIDECTOMY 2 COLUMN;  Surgeon: Andria Meuse, MD;  Location: [facility];  Service: General;  Laterality: N/A;   KNEE ARTHROSCOPY Right 1980   RECTAL EXAM UNDER ANESTHESIA N/A 04/06/2021   Procedure: ANORECTAL EXAM UNDER ANESTHESIA/ excision of anal polyp;  Surgeon: Andria Meuse, MD;  Location: [facility];  Service: General;  Laterality: N/A;   TONSILLECTOMY     at a very young age, but grew back   TOTAL HIP ARTHROPLASTY Right 05/25/2019   Procedure: RIGHT TOTAL HIP ARTHROPLASTY ANTERIOR APPROACH;  Surgeon: Tarry Kos, MD;  Location: [facility];  Service: Orthopedics;  Laterality: Right;   TOTAL KNEE ARTHROPLASTY Right 09/04/2021   Procedure: RIGHT  TOTAL KNEE ARTHROPLASTY;  Surgeon: Tarry Kos, MD;  Location: [facility] OR;  Service: Orthopedics;  Laterality: Right;   Patient Active Problem List   Diagnosis Date Noted   Status post total right knee replacement 09/04/2021   Leukocytosis 10/11/2020   PAD (peripheral artery disease) (HCC) 06/29/2020   Prediabetes 04/22/2020   Chronic kidney disease, stage 3a (HCC) 04/15/2020   Severe tobacco dependence 04/12/2020   Lower urinary tract symptoms (LUTS) 04/12/2020   History of colon polyps 04/12/2020   Atherosclerosis of native arteries of extremity with  intermittent claudication (HCC) 03/01/2020   Pain in both feet 07/31/2019   Status post total replacement of right hip 05/25/2019   Lumbar radiculopathy 02/05/2019    PCP: Loyola Mast MD  REFERRING PROVIDER: Horton Chin, MD  REFERRING DIAG: [phone] (ICD-10-CM) - Bilateral hip pain  THERAPY DIAG:  Difficulty in walking, not elsewhere classified  Pain in right hip  Pain in left hip  Muscle weakness (generalized)  Muscle spasm of back  Rationale for Evaluation and Treatment: Rehabilitation  ONSET DATE: chronic   SUBJECTIVE:   SUBJECTIVE STATEMENT: Had to cancel the Pain doc appt. Due to home obligation. He took the trash out the other day and immediately it hurt. He feels like some days it is fine and some days its terrible.  It's a little better but not consistently.  Its getting frustrating.   PERTINENT HISTORY: Rt THA 2021, R TKA 2023, lumbar fusion (L4-L5) 2020 PAIN:  Are you having pain? Yes: NPRS scale: none/10 Pain location: Rt buttock   Pain description: deep ache, sharp   Aggravating factors: walking  Relieving factors: standing , leaning , holding on to something, stopping  Pain goes to 10/10 with walking 10 min    Just stiffness today!   PRECAUTIONS: None  RED FLAGS: None   WEIGHT BEARING RESTRICTIONS: No  FALLS:  Has patient fallen in last 6 months? No  LIVING ENVIRONMENT: Lives with: lives with their spouse Lives in: House/apartment Stairs: Yes: Internal: 12 steps; on right going up Has following equipment at home: None has a cane but does not use it   OCCUPATION: Retired, [employer]   PLOF: Independent  PATIENT GOALS: Patient wants to walk without pain   NEXT MD VISIT: as needed   OBJECTIVE:   DIAGNOSTIC FINDINGS:  08/2022 He does have moderate  degenerative changes to the left hip joint  Rt hip prosthesis in place.  IMPRESSION: Lumbar  1. Status post posterior instrumented fusion and decompression at L4-L5  without residual spinal canal or neural foraminal stenosis. 2. Adjacent segment disease at L3-L4 with trace retrolisthesis, disc bulge, and bilateral facet arthropathy resulting in mild spinal canal and bilateral subarticular zone without evidence of frank nerve root impingement, and mild left worse than right neural foraminal stenosis. 3. Mild left worse than right subarticular zone narrowing L2-L3.    PATIENT SURVEYS:  FOTO 50%  10/12/22 FOTO 51%   10/31/22:  40%  COGNITION: Overall cognitive status: Within functional limits for tasks assessed     SENSATION: WFL   MUSCLE LENGTH: Hamstrings: tight  Thomas test: neg Quads: tight in prone   POSTURE: increased thoracic kyphosis, flexed trunk , and Low Rt shoulder   PALPATION: TTP  LUMBAR ROM:   Active  A/PROM  eval AROM 10/31/22  Flexion Distal shin Hamstring tightness  Hamstring tightness, pain back up to neutral  Extension 50% 25% min pain   Right lateral flexion 50% 25% pain  Left lateral flexion 50% 25% pain   Right rotation 50% 50% pain end range  Left rotation 50% 50% pain end range  Stretch, no pain    LOWER EXTREMITY ROM:  Passive ROM Right eval Left eval  Hip flexion WFL, min ant hip pain  WFL  Hip extension    Hip abduction    Hip adduction    Hip internal rotation Min tight WFL  Hip external rotation Min tight  WFL   Knee flexion [facility]  Knee extension [facility]  Ankle dorsiflexion    Ankle plantarflexion    Ankle inversion    Ankle eversion     (Blank rows = not tested)  LOWER EXTREMITY MMT:  MMT Right eval Left eval Rt./Lt.  09/10/22 Rt./Lt.  10/31/22  Hip flexion  4 4    Hip extension   4+/5   Hip abduction   4+/5  4+/5  Hip adduction      Hip internal rotation      Hip external rotation      Knee flexion 5 5  5/5  Knee extension 5 5  5/5  Ankle dorsiflexion      Ankle plantarflexion      Ankle inversion      Ankle eversion       (Blank rows = not tested)  LOWER EXTREMITY  SPECIAL TESTS:  Hip special tests: Luisa Hart (FABER) test: negative, Trendelenburg test: negative, and Thomas test: negative  FUNCTIONAL TESTS:  5 times sit to stand: 10. 9 sec  2 minute walk test: 449 feet, pain L side 2/10.    10/31/22: 2 min walk 495 feet pain 5/10.  GAIT: Distance walked: 449 feet , pain increased   Assistive device utilized: None Level of assistance: Modified independence Comments: increased L sided back and L ASIS /hip pain with this   TODAY'S TREATMENT:           [facility] Adult PT Treatment:                                                DATE: 10/31/22 Therapeutic Exercise: NuStep L4 UE and LE for 6 min  Trunk ROM Supine LTR  x 10  Sidelying open book x 5  Hip abduction x 15   Self Care/therapeutic activity FOTO, HEP and 2 min walk: POC, TENS unit, pain mgmt.   Posture and lifting    [facility] Adult PT Treatment:                                                DATE: 10/26/22 Therapeutic Exercise: TM 1.5 mph, 5 min , pain increased to 7/10 Supine knee to chest and double knee to chest x 2 each LE Supine banded bridge green x 15  Bridge with march green band x 15  Bridge with clam green band x 15  Standing: hip extension at countertop 3 lbs x 15 each side  Hip abduction 3 lbs x 15 each Palloff yellow spring at springboard (back pain 7/10) x  10    [facility] Adult PT Treatment:  DATE: 10/24/22 Therapeutic Exercise: Supine LTR feet wide  Knee to opp shoulder 30 sec x 3 Rt >Lt.  Hamstring 30 sec x 2 each side  Piriformis Rt hip, knee pressing away x 5 Quadruped cat to neutral to child's pose for 1 min  Bird dog x 10 each side pain on knee (TKA) Modified to high plank on elevated mat table Shoulder tap Rt hand limitations  Downward dog on high mat Low plank mtn climbers x 8 slow  Modalities: IFC with MHP to Rt post hip 15 min in hook lying  Self Care: TENS unit, cont POC, core     [facility] Adult PT Treatment:                                                 DATE: 10/19/22 Therapeutic Exercise: Piriformis stretch push/pull 3 x 30 sec each Side clamshell with green 2 x 15 Bridge with green at knees 2 x 10 Manual: Skilled palpation and monitoring of muscle tension while performing TPDN STM/TPR right piriformis, glute max and med Trigger Point Dry Needling Treatment: Pre-treatment instruction: Patient instructed on dry needling rationale, procedures, and possible side effects including pain during treatment (achy,cramping feeling), bruising, drop of blood, lightheadedness, nausea, sweating. Patient Consent Given: Yes Education handout provided: No Muscles treated: Right piriformis, glute max and med Needle size and number: .30x32mm x 3 Electrical stimulation performed: No Parameters: N/A Treatment response/outcome: Twitch response elicited and Palpable decrease in muscle tension Post-treatment instructions: Patient instructed to expect possible mild to moderate muscle soreness later today and/or tomorrow. Patient instructed in methods to reduce muscle soreness and to continue prescribed HEP. If patient was dry needled over the lung field, patient was instructed on signs and symptoms of pneumothorax and, however unlikely, to see immediate medical attention should they occur. Patient was also educated on signs and symptoms of infection and to seek medical attention should they occur. Patient verbalized understanding of these instructions and education.   [facility] Adult PT Treatment:                                                DATE: 10/17/22 Therapeutic Exercise: Treadmill 1.8 mph for 5 min , increased pain in Hips 5/10 Knees to chest double and single 2-3 each side  Physio ball: hamstring curl, bridge and oblique rotation  Book opening x 5  Sidelying hip abduction x 15  Seated piriformis stretching figure 4 modified  Hip adductor stretch at sink Step ups 6 inch x 15 each side  Stagger stance hip hinge no wgt (15  lbs ), difficulty with Rt leg, spasm Sink stretch x 5  [facility] Adult PT Treatment:                                                DATE: 10/12/22 Therapeutic Exercise: Nustep 6 min L 4 UE/LE  Mat/hip stretching: Knees crossed with rotation, piriformis  Post/anterior pelvic tilt x 10  Knee/s to chest  LTR x 10  Manual Therapy: PROM Rt hip  LAD Rt LE x 5  Prone/sidelying trigger point work to Rt hip, glute  Modalities: Cold pack 10 min prone  Self Care: Dry needling, plan of care    PATIENT EDUCATION:  Education details: home TENS POC  Person educated: Patient Education method: Explanation, Demonstration, and Handouts Education comprehension: verbalized understanding  HOME EXERCISE PROGRAM:  Access Code: [email or web address] URL: [email or web address] Date: 10/03/2022 Prepared by: Karie Mainland  Exercises - Seated Hamstring Stretch  - 1-2 x daily - 7 x weekly - 1 sets - 3 reps - 30 hold - Supine Lower Trunk Rotation  - 2 x daily - 7 x weekly - 2 sets - 10 reps - Supine Bridge  - 2 x daily - 7 x weekly - 2 sets - 10 reps - Hooklying Single Knee to Chest Stretch  - 1 x daily - 7 x weekly - 1 sets - 3 reps - 30 hold - Cat to Child's Pose with Posterior Pelvic Tilt  - 1 x daily - 7 x weekly - 1 sets - 5-10 reps - 10-30 hold - Supine ASLR with Ab Bracing and Shoulder Extension with Anchored Resistance  - 1 x daily - 7 x weekly - 2 sets - 10 reps - 5 hold Added side lying clam   ASSESSMENT:  CLINICAL IMPRESSION: Patient has made only minimal progress since beginning PT.  He has days when he barely has any pain with walking but also days that he can barely walk > 5 min . Sitting or standing still immediately decreases pain .  He improved his 2 min walk distance. He did not respond favorably to dry needling. He has muscle spasms with certain movement.  Pain is likely  coming from a progression of his lumbar stenosis.  He missed his appt yesterday (he rescheduled it) b and will be out of town or a bit later in the month.  He will be placed on hold to see if he can manage his symptoms at home with HEP and TENS unit.  He will call to reschedule an appt here if he feels he needs it after seeing the MD.  Will follow up.    OBJECTIVE IMPAIRMENTS: decreased activity tolerance, decreased mobility, difficulty walking, decreased ROM, hypomobility, increased fascial restrictions, impaired flexibility, improper body mechanics, postural dysfunction, and pain.   ACTIVITY LIMITATIONS: standing and locomotion level  PARTICIPATION LIMITATIONS: shopping and community activity  PERSONAL FACTORS: Past/current experiences, Time since onset of injury/illness/exacerbation, and 3+ comorbidities: previous back surgery, Rt knee, Rt hip replacement   are also affecting patient's functional outcome.   REHAB POTENTIAL: Good  CLINICAL DECISION MAKING: Evolving/moderate complexity  EVALUATION COMPLEXITY: Moderate   GOALS: Goals reviewed with patient? Yes  SHORT TERM GOALS: Target date: 09/27/2022   Pt will be I with HEP for core and lifting, hips  Baseline: unknown, does have HEP from previous PT, does occasionally  Goal status: MET  2.  Pt will be able to walk on level surface  for 5 min with pain < 5/10 in hips  Baseline: 2 min , increased pain L hip and back (4/10)  Goal status:MET   LONG TERM GOALS: Target date: 11/01/2022    Pt will improve FOTO score to 61% or better to demo improved functional mobility  Baseline: 50% Progress 40%  Goal status:ongoing  2.  Pt will be able to walk in the grocery store with overall min increased in hip pain using the cart for 30 min .  Baseline: 15 min pain can be 9/10-10/10 Progress: unchanged most of the time  Goal status: ongoing   3.  Pt will be able to show I with final HEP  Baseline: unknown  Progress: in progress  Goal  status: ongoing   4.  Pt will be able to show proper squat and lifting to preserve back with functional activities at home  Baseline: needs cues  Progress: still need cues  Goal status:ongoing   5.  Pt will be able to perform a plank for 30 sec (modified if needed) without increased back pain  Baseline: NT on eval  Progress: modified at high mat table  Goal status: met    PLAN:  PT FREQUENCY: 2x/week  PT DURATION: 8 weeks 6-8 weeks   PLANNED INTERVENTIONS: Therapeutic exercises, Therapeutic activity, Neuromuscular re-education, Patient/Family education, Self Care, Joint mobilization, Spinal mobilization, Cryotherapy, Moist heat, Manual therapy, and Re-evaluation  PLAN FOR NEXT SESSION:  HOLD>    Karie Mainland, PT 10/31/22 12:04 PM  Phone: [phone] Fax: [phone]

## 2022-10-31 ENCOUNTER — Encounter: Payer: Self-pay | Admitting: Physical Therapy

## 2022-10-31 ENCOUNTER — Ambulatory Visit: Payer: Medicare Other | Admitting: Physical Therapy

## 2022-10-31 DIAGNOSIS — R262 Difficulty in walking, not elsewhere classified: Secondary | ICD-10-CM | POA: Diagnosis not present

## 2022-10-31 DIAGNOSIS — M25552 Pain in left hip: Secondary | ICD-10-CM

## 2022-10-31 DIAGNOSIS — M25551 Pain in right hip: Secondary | ICD-10-CM

## 2022-10-31 DIAGNOSIS — M6281 Muscle weakness (generalized): Secondary | ICD-10-CM

## 2022-10-31 DIAGNOSIS — M62830 Muscle spasm of back: Secondary | ICD-10-CM

## 2022-11-02 ENCOUNTER — Ambulatory Visit: Payer: Medicare Other | Admitting: Physical Therapy

## 2022-11-12 ENCOUNTER — Encounter: Payer: Self-pay | Admitting: Physical Medicine and Rehabilitation

## 2022-11-12 ENCOUNTER — Encounter
Payer: Medicare Other | Attending: Physical Medicine and Rehabilitation | Admitting: Physical Medicine and Rehabilitation

## 2022-11-12 VITALS — BP 110/68 | HR 89 | Ht 68.0 in | Wt 164.0 lb

## 2022-11-12 DIAGNOSIS — M47819 Spondylosis without myelopathy or radiculopathy, site unspecified: Secondary | ICD-10-CM | POA: Diagnosis present

## 2022-11-12 DIAGNOSIS — L909 Atrophic disorder of skin, unspecified: Secondary | ICD-10-CM | POA: Insufficient documentation

## 2022-11-12 DIAGNOSIS — F17200 Nicotine dependence, unspecified, uncomplicated: Secondary | ICD-10-CM | POA: Diagnosis present

## 2022-11-12 DIAGNOSIS — G629 Polyneuropathy, unspecified: Secondary | ICD-10-CM | POA: Insufficient documentation

## 2022-11-12 NOTE — Progress Notes (Addendum)
Subjective:    Patient ID: Gerald Carpenter, male    DOB: 02-Mar-1950, 72 y.o.   MRN: [id number]  HPI Gerald Carpenter is a 72 year old man who presents for follow-up of PAD with legs locking up.  1) Feet pain: -pain has greatly improved since he removed the inserts [place]he parked in the upper structure and he walked here without issue -any further than that and he feels pain -wears supportive shoes -has been trying to wear these shoes constantly   2) Hip pain -hurting when walking uphill - he thinks the foot pain contributes to the hip pain -had improvement with PT and is still doing some of the exercises but some he can't do without help -He is s/p bypass- he does not note improvements in the leg after the bypass -He has decreased sensation in his left lower leg and pain. -pain is 4/10 on average and right now. Feels intermittent, dull, aching. -He does not take any medications for pain -He is a recovering alcoholic.  -he is an active smoker -his back has been killing him, also in is hip and buttock.  -2 years of benefit form his hip and back surgeries -pain is 4-5/10 -the therapy helped his walking and flexibility.  -hamstring is very tight and his wife has been helping to stretch this out.  -he does not want any pain medication, he would rather be in pain.  -his wife is returning to the [place] on 12/29. They plan to joint the gym together.   1) Knee replacement -did PT following surgery  2) Bilateral fat pad atrophy -this is bothering him the most right now -he feels this pain was causing him to walk funny and impacted his hips  3) bilateral hip pin -improved greatly with physical therapy -he was referred to another physician -had an MRI done on his back and pathology was noted at levels 3-4.  -has has a bulge at level 3-4.  -Dr. Vernie Ammons recommended that he follow here -rest helps.   -sometimes can only walk short distances, other times regularly.   4) Low back pain: -he really liked his therapist but felt the therapy worsened his back pain     Pain Inventory Average Pain 3 Pain Right Now 3 My pain is intermittent sharp, stabbing, aching  In the last 24 hours, has pain interfered with the following? General activity 7 Relation with others 5 Enjoyment of life 9 What TIME of day is your pain at its worst? Only when walking & lifting Sleep (in general) Good  Pain is worse with: walking, standing, and some activites Pain improves with: rest Relief from Meds: 0     Family History  Problem Relation Age of Onset   Pulmonary fibrosis Mother    Breast cancer Mother    Hyperlipidemia Mother    Hypertension Mother    Other Mother        Scarlet Fever   Heart disease Father    Hyperlipidemia Father    Diabetes Brother    Hypertension Brother    Breast cancer Maternal Aunt    Prostate cancer Paternal Grandfather    Hypothyroidism Daughter    Colon cancer Neg Hx    Esophageal cancer Neg Hx    Ulcerative colitis Neg Hx    Uterine cancer Neg Hx    Social History   Socioeconomic History   Marital status:  Married    Spouse name: Clarene Critchley   Number of children: 3   Years of education: Not on file   Highest education level: Not on file  Occupational History   Occupation: retired  Tobacco Use   Smoking status: Every Day    Current packs/day: 0.25    Average packs/day: 0.3 packs/day for 53.0 years (13.3 ttl pk-yrs)    Types: Cigarettes   Smokeless tobacco: Never   Tobacco comments:    had first cigarettes age  72, habit started at age 59/today he 34 cig a day  Vaping Use   Vaping status: Never Used  Substance and Sexual Activity   Alcohol use: Not Currently    Comment: 05/10/1991   Drug use: Not Currently    Comment: prior usage of "speed"  when drinking   Sexual activity: Yes  Other Topics Concern   Not on file  Social History Narrative    Recently moved here from [facility], [place]   Has total of 4 children 1 adopted   Right handed   Caffeine 3 cup a day    Live in 2 story home   Social Determinants of Health   Financial Resource Strain: Low Risk  (12/20/2021)   Overall Financial Resource Strain (CARDIA)    Difficulty of Paying Living Expenses: Not hard at all  Food Insecurity: No Food Insecurity (12/20/2021)   Hunger Vital Sign    Worried About Running Out of Food in the Last Year: Never true    Ran Out of Food in the Last Year: Never true  Transportation Needs: No Transportation Needs (12/20/2021)   PRAPARE - Administrator, Civil Service (Medical): No    Lack of Transportation (Non-Medical): No  Physical Activity: Insufficiently Active (12/20/2021)   Exercise Vital Sign    Days of Exercise per Week: 3 days    Minutes of Exercise per Session: 30 min  Stress: No Stress Concern Present (12/20/2021)   [employer] of Occupational Health - Occupational Stress Questionnaire    Feeling of Stress : Not at all  Social Connections: Moderately Integrated (12/20/2021)   Social Connection and Isolation Panel [NHANES]    Frequency of Communication with Friends and Family: More than three times a week    Frequency of Social Gatherings with Friends and Family: More than three times a week    Attends Religious Services: 1 to 4 times per year    Active Member of [employer] or Organizations: No    Attends Banker Meetings: Never    Marital Status: Married   Past Surgical History:  Procedure Laterality Date   ABDOMINAL AORTOGRAM W/LOWER EXTREMITY N/A 06/23/2020   Procedure: ABDOMINAL AORTOGRAM W/LOWER EXTREMITY;  Surgeon: Cephus Shelling, MD;  Location: [facility];  Service: Cardiovascular;  Laterality: N/A;   ANTERIOR LAT LUMBAR FUSION Right 02/05/2019   Procedure: ATTEMPTED ANTERIOR LATERAL INTERBODY FUSION;  Surgeon: Lisbeth Renshaw, MD;  Location: [facility] OR;  Service: Neurosurgery;  Laterality:  Right;  RIGHT LATERAL TRANSPSOAS DISCECTOMY AND FUSION WITH ROBOTIC ASSISTED PEDICLE SCREW STABILIZATION, LUMBAR 4- LUMBAR 5   CATARACT EXTRACTION W/ INTRAOCULAR LENS  IMPLANT, BILATERAL Bilateral 2013   lens implants for cataracts after Lasik   COLONOSCOPY     ENDOVEIN HARVEST OF GREATER SAPHENOUS VEIN Left 06/29/2020   Procedure: HARVEST OF LEFT GREATER SAPHENOUS VEIN;  Surgeon: Cephus Shelling, MD;  Location: [facility] OR;  Service: Vascular;  Laterality: Left;   FEMORAL-POPLITEAL BYPASS GRAFT Left 06/29/2020  Procedure: BYPASS GRAFT LEFT COMMON FEMORAL TO ABOVE KNEE POPLITEAL ARTERY;  Surgeon: Cephus Shelling, MD;  Location: [facility] OR;  Service: Vascular;  Laterality: Left;   HEMORRHOID SURGERY N/A 04/06/2021   Procedure: HEMORRHOIDECTOMY 2 COLUMN;  Surgeon: Andria Meuse, MD;  Location: [facility];  Service: General;  Laterality: N/A;   KNEE ARTHROSCOPY Right 1980   RECTAL EXAM UNDER ANESTHESIA N/A 04/06/2021   Procedure: ANORECTAL EXAM UNDER ANESTHESIA/ excision of anal polyp;  Surgeon: Andria Meuse, MD;  Location: [facility];  Service: General;  Laterality: N/A;   TONSILLECTOMY     at a very young age, but grew back   TOTAL HIP ARTHROPLASTY Right 05/25/2019   Procedure: RIGHT TOTAL HIP ARTHROPLASTY ANTERIOR APPROACH;  Surgeon: Tarry Kos, MD;  Location: [facility] OR;  Service: Orthopedics;  Laterality: Right;   TOTAL KNEE ARTHROPLASTY Right 09/04/2021   Procedure: RIGHT TOTAL KNEE ARTHROPLASTY;  Surgeon: Tarry Kos, MD;  Location: [facility] OR;  Service: Orthopedics;  Laterality: Right;   Past Medical History:  Diagnosis Date   Alcoholism (HCC)    Arthritis    Cataract    Colon polyps    Complication of anesthesia    HLD (hyperlipidemia)    Nephritis    when pt. was 7 yrs. ols   Peripheral vascular disease (HCC)    PONV (postoperative nausea and vomiting)    "Only when I have aortagram on 06/23/20".   Primary osteoarthritis of right hip  04/29/2019   Substance abuse (HCC)    There were no vitals taken for this visit.  Opioid Risk Score:   Fall Risk Score:  `1  Depression screen [facility] 2/9     08/27/2022    9:26 AM 07/24/2022   10:00 AM 05/28/2022   11:21 AM 03/23/2022   10:10 AM 12/20/2021    9:18 AM 10/25/2021    7:57 AM 04/13/2021    9:19 AM  Depression screen PHQ 2/9  Decreased Interest 0 0 0 0 0 0 0  Down, Depressed, Hopeless 0 0 0 0 0 0 0  PHQ - 2 Score 0 0 0 0 0 0 0     Review of Systems  Constitutional: Negative.   HENT: Negative.    Eyes: Negative.   Respiratory: Negative.    Cardiovascular: Negative.   Gastrointestinal: Negative.   Endocrine: Negative.   Genitourinary: Negative.   Musculoskeletal:  Positive for back pain.       B/l foot hip pain Leg numbness or heaviness  Skin: Negative.   Allergic/Immunologic: Negative.   Neurological:  Positive for weakness and numbness.       Tingling  Hematological: Negative.   Psychiatric/Behavioral:  Positive for dysphoric mood and suicidal ideas.   All other systems reviewed and are negative.      Objective:   Physical Exam  Gen: no distress, normal appearing, BP 119/73, weight 164 lbs.  Gen: no distress, normal appearing HEENT: oral mucosa pink and moist, NCAT Cardio: Reg rate Chest: normal effort, normal rate of breathing Abd: soft, non-distended Ext: no edema Psych: pleasant, normal affect Skin: intact Neuro:Decreased sensation in left lower extremity.  MSK: Knee popping. Normal gait, good lumbar extension and flexion. B/l fat pad atrophy to feets     Assessment & Plan:   Mr. Brodie is a 72 year old man who presents to establish care for lack of sensation in left leg following bypass for PAD.  1) Lack of sensation in left lower  leg following bypass -Discussed Qutenza as an option for neuropathic pain control. Discussed that this is a capsaicin patch, stronger than capsaicin cream. Discussed that it is currently approved for diabetic peripheral  neuropathy and post-herpetic neuralgia, but that it has also shown benefit in treating other forms of neuropathy. Provided patient with link to site to learn more about the patch: https://www.clark.biz/. Discussed that the patch would be placed in office and benefits usually last 3 months. Discussed that unintended exposure to capsaicin can cause severe irritation of eyes, mucous membranes, respiratory tract, and skin, but that Qutenza is a local treatment and does not have the systemic side effects of other nerve medications. Discussed that there may be pain, itching, erythema, and decreased sensory function associated with the application of Qutenza. Side effects usually subside within 1 week. A cold pack of analgesic medications can help with these side effects. Blood pressure can also be increased due to pain associated with administration of the patch.   2) Active smoker: -discussed benefits of reducing smoking.  -discussed cold [place] -discussed use of the lozenges and the patch.  -Naltrexone made him sick -his New year's resolution is to stop smoking -he quit for a year using the patch.  -discussed that he has been smoking for 60 years and he is down to 12-13 now.  -discussed that he quite for a year with the nicotine patch  3) Provided with list of supplements that can help with dyslipidemia: 1) Vitamin B3 500-4,000mg  in divided doses daily (would recommend starting low as can cause uncomfortable facial flushing if started at too high a dose) 2) Phytosterols 2.15 grams daily 3) Fermented soy 30-50 grams daily 4) EGCG (found in green tea): 500-1000mg  daily 5) Omega-3 fatty acids 3000-5,000mg  daily 6) Flax seed 40 grams daily 7) Monounsaturated fats 20-40 grams daily (olives, olive oil, nuts), also reduces cardiovascular disease 8) Sesame: 40 grams daily 9) Gamma/delta tocotrienols- a family of unsaturated forms of Vitamin E- 200mg  with dinner 10) Pantethine 900mg  daily in divided  doses 11) Resveratrol 250mg  daily 12) N Acetyl Cysteine 2000mg  daily in divided doses 13) Curcumin 2000-5000mg  in divided doses daily 14) Pomegranate juice: 8 ounces daily, also helps to lower blood pressure 15) Pomegranate seeds one cup daily, also helps to lower blood pressure 16) Citrus Bergamot 1000mg  daily, also helps with glucose control and weight loss 17) Vitamin C 500mg  daily 18) Quercetin 500-1000mg  daily 19) Glutathione 20) Probiotics 60-100 billion organisms per day 21) Fiber 22) Oats 23) Aged garlic (can eat as food or supplement of 600-900mg  per day) 24) Chia seeds 25 grams per day 25) Lycopene- carotenoid found in high concentrations in tomatoes. 26) Alpha linolenic acid 27) Flavonoids and anthocyanins 28) Wogonin- flavanoid that enhances reverse cholesterol transport 29) Coenzyme Q10 30) Pantethine- derivative of Vitamin B5: 300mg  three times per day or 450mg  twice per day with or without food 31) Barley and other whole grains 32) Orange juice 33) L- carnitine 34) L- Lysine 35) L- Arginine 36) Almonds 37) Morin 38) Rutin 39) Carnosine 40) Histidine  41) Kaempferol  42) Organosulfur compounds 43) Vitamin E 44) Oleic acid 45) RBO (ferulic acid gammaoryzanol) 46) grape seed extract 47) Red wine 48) Berberine HCL 500mg  daily or twice per day- more effective and with fewer adverse effects that ezetimibe monotherapy 49) red yeast rice 2400- 4800 mg/day 50) chlorella 51) Licorice   4) Right knee pain and popping: -had good benefit from a gel injection 1.5 years ago.  -encouraged repeat viscosupplementation  if pain in knee worsens -Provided with a pain relief journal and discussed that it contains foods and lifestyle tips to naturally help to improve pain. Discussed that these lifestyle strategies are also very good for health unlike some medications which can have negative side effects. Discussed that the act of keeping a journal can be therapeutic and helpful  to realize patterns what helps to trigger and alleviate pain.    5) Low back pain 2/2 facet arthropathy -discussed lidocaine patch but he defers.  MRI of spine reviewed with him D/c gabapentin -discussed medial branch blocks and Qutenza -Discussed Qutenza as an option for neuropathic pain control. Discussed that this is a capsaicin patch, stronger than capsaicin cream. Discussed that it is currently approved for diabetic peripheral neuropathy and post-herpetic neuralgia, but that it has also shown benefit in treating other forms of neuropathy. Provided patient with link to site to learn more about the patch: https://www.clark.biz/. Discussed that the patch would be placed in office and benefits usually last 3 months. Discussed that unintended exposure to capsaicin can cause severe irritation of eyes, mucous membranes, respiratory tract, and skin, but that Qutenza is a local treatment and does not have the systemic side effects of other nerve medications. Discussed that there may be pain, itching, erythema, and decreased sensory function associated with the application of Qutenza. Side effects usually subside within 1 week. A cold pack of analgesic medications can help with these side effects. Blood pressure can also be increased due to pain associated with administration of the patch.   6) Fat pad atrophy: -continue red light therapy -continue gabapentin -discussed that he continues to sleep well.  -discussed PT -discussed orthotics and how these made his pain worse -continue foods with healthy fats Discussed extracorporeal shockwave therapy as a modality for treatment. Discussed that the device looks and feels like a massage gun and I would move it over the area of pain for about 10 minutes. The device releases sound waves to the area of pain and helps to improve blood flow and circulation to improve the healing process. Discuss that this initially induces inflammation and can sometimes cause  short-term increase in pain. Discussed that we typically do three weekly treatments, but sometimes up to 6 if needed, and after 6 weeks long term benefits can sometimes be achieved. Discussed that this is an FDA approved device, but not covered by insurance and would cost $60 per session. Will scheduled patient for 6 consecutive appointments and can cancel latter three if benefits are achieved after first three sessions.    -Discussed Qutenza as an option for neuropathic pain control. Discussed that this is a capsaicin patch, stronger than capsaicin cream. Discussed that it is currently approved for diabetic peripheral neuropathy and post-herpetic neuralgia, but that it has also shown benefit in treating other forms of neuropathy. Provided patient with link to site to learn more about the patch: https://www.clark.biz/. Discussed that the patch would be placed in office and benefits usually last 3 months. Discussed that unintended exposure to capsaicin can cause severe irritation of eyes, mucous membranes, respiratory tract, and skin, but that Qutenza is a local treatment and does not have the systemic side effects of other nerve medications. Discussed that there may be pain, itching, erythema, and decreased sensory function associated with the application of Qutenza. Side effects usually subside within 1 week. A cold pack of analgesic medications can help with these side effects. Blood pressure can also be increased due to pain associated with administration  of the patch.   -Discussed following foods that may reduce pain: 1) Ginger (especially studied for arthritis)- reduce leukotriene production to decrease inflammation 2) Blueberries- high in phytonutrients that decrease inflammation 3) Salmon- marine omega-3s reduce joint swelling and pain 4) Pumpkin seeds- reduce inflammation 5) dark chocolate- reduces inflammation 6) turmeric- reduces inflammation 7) tart cherries - reduce pain and stiffness 8)  extra virgin olive oil - its compound olecanthal helps to block prostaglandins  9) chili peppers- can be eaten or applied topically via capsaicin 10) mint- helpful for headache, muscle aches, joint pain, and itching 11) garlic- reduces inflammation  Link to further information on diet for chronic pain: http://www.bray.com/   7) Bilateral hip pain:  -d/c gabapentin  -discussed that patient notes his pain started after starting atorvastatin, reviewed LDL from March and it is 46, messaged Dr. Veto Kemps to see if he would be comfortable with Atorvastatin being reduced to 20mg  daily. -discussed current pain, prior hip surgery.  -discussed great improvement with PT -discussed that could be secondary to lumbar facet arthropathy/retrolisthesis.  -will refer back to PT as he is continuing some home exercises but can't do them all at home. -continue red light therapy -continue collagen  -continue follow-up with ortho -discussed that when it starts, pain is very severe.  Turmeric to reduce inflammation--can be used in cooking or taken as a supplement.  Benefits of turmeric:  -Highly anti-inflammatory  -Increases antioxidants  -Improves memory, attention, brain disease  -Lowers risk of heart disease  -May help prevent cancer  -Decreases pain  -Alleviates depression  -Delays aging and decreases risk of chronic disease  -Consume with black pepper to increase absorption    Turmeric Milk Recipe:  1 cup milk  1 tsp turmeric  1 tsp cinnamon  1 tsp grated ginger (optional)  Black pepper (boosts the anti-inflammatory properties of turmeric).  1 tsp honey   8) Active smoker: -discussed that is having a hard time quitting though he wants to -discussed medication options for cessation Continue nicotine patch as it is allowing him to smoke less  >40 minutes spent in discussion of his PT for his lower back, trial of  e-stim, dry needling, that cobra position worsened his pain, discussed Qutenza for lower back and the neuropathy radiating into both hip, discussed medial branch blocks, discussed the Nexwave frim Zynex, discussed that he did not feel that that the [facility] was worth it, discussed that he went to his spinal surgeon and was told that his spine was not in severe enough shape to warrant surgery, discussed less invasive spinal procedure options for his spinal pain, discussed that pain limits his walking, discussed that red light therapy did not help him but it did help his daughter.

## 2022-11-26 ENCOUNTER — Encounter: Payer: Medicare Other | Admitting: Physical Medicine and Rehabilitation

## 2022-12-13 ENCOUNTER — Encounter
Payer: Medicare Other | Attending: Physical Medicine and Rehabilitation | Admitting: Physical Medicine and Rehabilitation

## 2022-12-13 ENCOUNTER — Encounter: Payer: Self-pay | Admitting: Physical Medicine and Rehabilitation

## 2022-12-13 VITALS — BP 125/74 | HR 90 | Ht 68.0 in | Wt 168.4 lb

## 2022-12-13 DIAGNOSIS — R208 Other disturbances of skin sensation: Secondary | ICD-10-CM | POA: Diagnosis present

## 2022-12-13 DIAGNOSIS — M25552 Pain in left hip: Secondary | ICD-10-CM | POA: Diagnosis present

## 2022-12-13 DIAGNOSIS — M545 Low back pain, unspecified: Secondary | ICD-10-CM | POA: Insufficient documentation

## 2022-12-13 DIAGNOSIS — M25551 Pain in right hip: Secondary | ICD-10-CM | POA: Diagnosis not present

## 2022-12-13 MED ORDER — CAPSAICIN-CLEANSING GEL 8 % EX KIT
4.0000 | PACK | Freq: Once | CUTANEOUS | Status: AC
Start: 2022-12-13 — End: 2022-12-13
  Administered 2022-12-13: 4 via TOPICAL

## 2022-12-13 NOTE — Progress Notes (Signed)
Subjective:    Patient ID: Gerald Carpenter, male    DOB: 1951/01/23, 72 y.o.   MRN: [id number]  HPI Gerald Carpenter is a 72 year old man who presents for follow-up of PAD with legs locking up.  1) Feet pain: -pain has greatly improved since he removed the inserts [place]he parked in the upper structure and he walked here without issue -any further than that and he feels pain -wears supportive shoes -has been trying to wear these shoes constantly   2) Hip pain -He would like to try Qutenza here as well -hurting when walking uphill - he thinks the foot pain contributes to the hip pain -had improvement with PT and is still doing some of the exercises but some he can't do without help -He is s/p bypass- he does not note improvements in the leg after the bypass -He has decreased sensation in his left lower leg and pain. -pain is 4/10 on average and right now. Feels intermittent, dull, aching. -He does not take any medications for pain -He is a recovering alcoholic.  -he is an active smoker -his back has been killing him, also in is hip and buttock.  -2 years of benefit form his hip and back surgeries -pain is 4-5/10 -the therapy helped his walking and flexibility.  -hamstring is very tight and his wife has been helping to stretch this out.  -he does not want any pain medication, he would rather be in pain.  -his wife is returning to the [place] on 12/29. They plan to joint the gym together.  -improved greatly initially with physical therapy -he was referred to another physician -had an MRI done on his back and pathology was noted at levels 3-4.  -has has a bulge at level 3-4.  -Dr. Vernie Ammons recommended that he follow here -rest helps.  -sometimes can only walk short distances, other times regularly.   3) Knee replacement -did PT following surgery  4) Bilateral fat pad atrophy -this is bothering  him the most right now -he feels this pain was causing him to walk funny and impacted his hips  5) Low back pain: -failed PT, 6 epidurals, surgery, steroid dose pack -the only thing that has ever helped him has been hormones and they helped him for a year- he is not sure what hormones he received.  -he really liked his therapist but felt the therapy worsened his back pain -has an XR at [facility] and was told his spine looks bad at L5-S1 -feels that his pain is at the same place it was at before he had his spinal surgery 4 years ago.  - ready to try Qutenza today     Pain Inventory Average Pain 3 Pain Right Now 3 My pain is intermittent sharp, stabbing, aching  In the last 24 hours, has pain interfered with the following? General activity 7 Relation with others 5 Enjoyment of life 9 What TIME of day is your pain at its worst? Only when walking & lifting Sleep (in general) Good  Pain is worse with: walking, standing, and some activites Pain improves with: rest Relief from Meds: 0     Family History  Problem Relation Age of Onset   Pulmonary fibrosis Mother    Breast cancer Mother    Hyperlipidemia Mother    Hypertension Mother    Other Mother  Scarlet Fever   Heart disease Father    Hyperlipidemia Father    Diabetes Brother    Hypertension Brother    Breast cancer Maternal Aunt    Prostate cancer Paternal Grandfather    Hypothyroidism Daughter    Colon cancer Neg Hx    Esophageal cancer Neg Hx    Ulcerative colitis Neg Hx    Uterine cancer Neg Hx    Social History   Socioeconomic History   Marital status: Married    Spouse name: Clarene Critchley   Number of children: 3   Years of education: Not on file   Highest education level: Not on file  Occupational History   Occupation: retired  Tobacco Use   Smoking status: Every Day    Current packs/day: 0.25    Average packs/day: 0.3 packs/day for 53.0 years (13.3 ttl pk-yrs)    Types: Cigarettes   Smokeless  tobacco: Never   Tobacco comments:    had first cigarettes age  68, habit started at age 64/today he 71 cig a day  Vaping Use   Vaping status: Never Used  Substance and Sexual Activity   Alcohol use: Not Currently    Comment: 05/10/1991   Drug use: Not Currently    Comment: prior usage of "speed"  when drinking   Sexual activity: Yes  Other Topics Concern   Not on file  Social History Narrative   Recently moved here from [facility]    Has total of 4 children 1 adopted   Right handed   Caffeine 3 cup a day    Live in 2 story home   Social Determinants of Health   Financial Resource Strain: Low Risk  (12/20/2021)   Overall Financial Resource Strain (CARDIA)    Difficulty of Paying Living Expenses: Not hard at all  Food Insecurity: No Food Insecurity (12/20/2021)   Hunger Vital Sign    Worried About Running Out of Food in the Last Year: Never true    Ran Out of Food in the Last Year: Never true  Transportation Needs: No Transportation Needs (12/20/2021)   PRAPARE - Administrator, Civil Service (Medical): No    Lack of Transportation (Non-Medical): No  Physical Activity: Insufficiently Active (12/20/2021)   Exercise Vital Sign    Days of Exercise per Week: 3 days    Minutes of Exercise per Session: 30 min  Stress: No Stress Concern Present (12/20/2021)   [employer] of Occupational Health - Occupational Stress Questionnaire    Feeling of Stress : Not at all  Social Connections: Moderately Integrated (12/20/2021)   Social Connection and Isolation Panel [NHANES]    Frequency of Communication with Friends and Family: More than three times a week    Frequency of Social Gatherings with Friends and Family: More than three times a week    Attends Religious Services: 1 to 4 times per year    Active Member of [employer] or Organizations: No    Attends Banker Meetings: Never    Marital Status: Married   Past Surgical History:  Procedure Laterality Date    ABDOMINAL AORTOGRAM W/LOWER EXTREMITY N/A 06/23/2020   Procedure: ABDOMINAL AORTOGRAM W/LOWER EXTREMITY;  Surgeon: Cephus Shelling, MD;  Location: [facility];  Service: Cardiovascular;  Laterality: N/A;   ANTERIOR LAT LUMBAR FUSION Right 02/05/2019   Procedure: ATTEMPTED ANTERIOR LATERAL INTERBODY FUSION;  Surgeon: Lisbeth Renshaw, MD;  Location: [facility];  Service: Neurosurgery;  Laterality: Right;  RIGHT LATERAL TRANSPSOAS  DISCECTOMY AND FUSION WITH ROBOTIC ASSISTED PEDICLE SCREW STABILIZATION, LUMBAR 4- LUMBAR 5   CATARACT EXTRACTION W/ INTRAOCULAR LENS  IMPLANT, BILATERAL Bilateral 2013   lens implants for cataracts after Lasik   COLONOSCOPY     ENDOVEIN HARVEST OF GREATER SAPHENOUS VEIN Left 06/29/2020   Procedure: HARVEST OF LEFT GREATER SAPHENOUS VEIN;  Surgeon: Cephus Shelling, MD;  Location: [facility];  Service: Vascular;  Laterality: Left;   FEMORAL-POPLITEAL BYPASS GRAFT Left 06/29/2020   Procedure: BYPASS GRAFT LEFT COMMON FEMORAL TO ABOVE KNEE POPLITEAL ARTERY;  Surgeon: Cephus Shelling, MD;  Location: [facility];  Service: Vascular;  Laterality: Left;   HEMORRHOID SURGERY N/A 04/06/2021   Procedure: HEMORRHOIDECTOMY 2 COLUMN;  Surgeon: Andria Meuse, MD;  Location: [facility];  Service: General;  Laterality: N/A;   KNEE ARTHROSCOPY Right 1980   RECTAL EXAM UNDER ANESTHESIA N/A 04/06/2021   Procedure: ANORECTAL EXAM UNDER ANESTHESIA/ excision of anal polyp;  Surgeon: Andria Meuse, MD;  Location: [facility];  Service: General;  Laterality: N/A;   TONSILLECTOMY     at a very young age, but grew back   TOTAL HIP ARTHROPLASTY Right 05/25/2019   Procedure: RIGHT TOTAL HIP ARTHROPLASTY ANTERIOR APPROACH;  Surgeon: Tarry Kos, MD;  Location: [facility];  Service: Orthopedics;  Laterality: Right;   TOTAL KNEE ARTHROPLASTY Right 09/04/2021   Procedure: RIGHT TOTAL KNEE ARTHROPLASTY;  Surgeon: Tarry Kos, MD;  Location: [facility];   Service: Orthopedics;  Laterality: Right;   Past Medical History:  Diagnosis Date   Alcoholism (HCC)    Arthritis    Cataract    Colon polyps    Complication of anesthesia    HLD (hyperlipidemia)    Nephritis    when pt. was 7 yrs. ols   Peripheral vascular disease (HCC)    PONV (postoperative nausea and vomiting)    "Only when I have aortagram on 06/23/20".   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   Substance abuse (HCC)    BP 125/74   Pulse 90   Ht 5\' 8"  (1.727 m)   Wt 168 lb 6.4 oz (76.4 kg)   SpO2 96%   BMI 25.61 kg/m   Opioid Risk Score:   Fall Risk Score:  `1  Depression screen [facility] 2/9     11/12/2022    9:44 AM 08/27/2022    9:26 AM 07/24/2022   10:00 AM 05/28/2022   11:21 AM 03/23/2022   10:10 AM 12/20/2021    9:18 AM 10/25/2021    7:57 AM  Depression screen PHQ 2/9  Decreased Interest 0 0 0 0 0 0 0  Down, Depressed, Hopeless 0 0 0 0 0 0 0  PHQ - 2 Score 0 0 0 0 0 0 0     Review of Systems  Constitutional: Negative.   HENT: Negative.    Eyes: Negative.   Respiratory: Negative.    Cardiovascular: Negative.   Gastrointestinal: Negative.   Endocrine: Negative.   Genitourinary: Negative.   Musculoskeletal:  Positive for back pain.       B/l foot hip pain Leg numbness or heaviness  Skin: Negative.   Allergic/Immunologic: Negative.   Neurological:  Positive for weakness and numbness.       Tingling  Hematological: Negative.   Psychiatric/Behavioral:  Positive for dysphoric mood and suicidal ideas.   All other systems reviewed and are negative.      Objective:   Physical Exam  Gen: no distress,  normal appearing, BP 119/73, weight 168 lbs.  Gen: no distress, normal appearing HEENT: oral mucosa pink and moist, NCAT Cardio: Reg rate Chest: normal effort, normal rate of breathing Abd: soft, non-distended Ext: no edema Psych: pleasant, normal affect Skin: intact Neuro:Decreased sensation in left lower extremity.  MSK: Knee popping. Normal gait,  good lumbar extension and flexion. B/l fat pad atrophy to feets     Assessment & Plan:   Mr. Ferryman is a 72 year old man who presents to establish care for lack of sensation in left leg following bypass for PAD.  1) Lack of sensation in left lower leg following bypass -Discussed Qutenza as an option for neuropathic pain control. Discussed that this is a capsaicin patch, stronger than capsaicin cream. Discussed that it is currently approved for diabetic peripheral neuropathy and post-herpetic neuralgia, but that it has also shown benefit in treating other forms of neuropathy. Provided patient with link to site to learn more about the patch: https://www.clark.biz/. Discussed that the patch would be placed in office and benefits usually last 3 months. Discussed that unintended exposure to capsaicin can cause severe irritation of eyes, mucous membranes, respiratory tract, and skin, but that Qutenza is a local treatment and does not have the systemic side effects of other nerve medications. Discussed that there may be pain, itching, erythema, and decreased sensory function associated with the application of Qutenza. Side effects usually subside within 1 week. A cold pack of analgesic medications can help with these side effects. Blood pressure can also be increased due to pain associated with administration of the patch.   2) Active smoker: -discussed benefits of reducing smoking.  -discussed cold [place] -discussed use of the lozenges and the patch.  -Naltrexone made him sick -his New year's resolution is to stop smoking -he quit for a year using the patch.  -discussed that he has been smoking for 60 years and he is down to 12-13 now.  -discussed that he quite for a year with the nicotine patch  3) Provided with list of supplements that can help with dyslipidemia: 1) Vitamin B3 500-4,000mg  in divided doses daily (would recommend starting low as can cause uncomfortable facial flushing if started at  too high a dose) 2) Phytosterols 2.15 grams daily 3) Fermented soy 30-50 grams daily 4) EGCG (found in green tea): 500-1000mg  daily 5) Omega-3 fatty acids 3000-5,000mg  daily 6) Flax seed 40 grams daily 7) Monounsaturated fats 20-40 grams daily (olives, olive oil, nuts), also reduces cardiovascular disease 8) Sesame: 40 grams daily 9) Gamma/delta tocotrienols- a family of unsaturated forms of Vitamin E- 200mg  with dinner 10) Pantethine 900mg  daily in divided doses 11) Resveratrol 250mg  daily 12) N Acetyl Cysteine 2000mg  daily in divided doses 13) Curcumin 2000-5000mg  in divided doses daily 14) Pomegranate juice: 8 ounces daily, also helps to lower blood pressure 15) Pomegranate seeds one cup daily, also helps to lower blood pressure 16) Citrus Bergamot 1000mg  daily, also helps with glucose control and weight loss 17) Vitamin C 500mg  daily 18) Quercetin 500-1000mg  daily 19) Glutathione 20) Probiotics 60-100 billion organisms per day 21) Fiber 22) Oats 23) Aged garlic (can eat as food or supplement of 600-900mg  per day) 24) Chia seeds 25 grams per day 25) Lycopene- carotenoid found in high concentrations in tomatoes. 26) Alpha linolenic acid 27) Flavonoids and anthocyanins 28) Wogonin- flavanoid that enhances reverse cholesterol transport 29) Coenzyme Q10 30) Pantethine- derivative of Vitamin B5: 300mg  three times per day or 450mg  twice per day with or without  food 31) Barley and other whole grains 32) Orange juice 33) L- carnitine 34) L- Lysine 35) L- Arginine 36) Almonds 37) Morin 38) Rutin 39) Carnosine 40) Histidine  41) Kaempferol  42) Organosulfur compounds 43) Vitamin E 44) Oleic acid 45) RBO (ferulic acid gammaoryzanol) 46) grape seed extract 47) Red wine 48) Berberine HCL 500mg  daily or twice per day- more effective and with fewer adverse effects that ezetimibe monotherapy 49) red yeast rice 2400- 4800 mg/day 50) chlorella 51) Licorice   4) Right knee pain  and popping: -had good benefit from a gel injection 1.5 years ago.  -encouraged repeat viscosupplementation if pain in knee worsens -Provided with a pain relief journal and discussed that it contains foods and lifestyle tips to naturally help to improve pain. Discussed that these lifestyle strategies are also very good for health unlike some medications which can have negative side effects. Discussed that the act of keeping a journal can be therapeutic and helpful to realize patterns what helps to trigger and alleviate pain.    5) Low back pain 2/2 facet arthropathy -discussed lidocaine patch but he defers.   -discussed risks and benefits of surgery for his spinal stenosis  MRI of spine reviewed with him D/c gabapentin -discussed medial branch blocks and Qutenza -Discussed Qutenza as an option for neuropathic pain control. Discussed that this is a capsaicin patch, stronger than capsaicin cream. Discussed that it is currently approved for diabetic peripheral neuropathy and post-herpetic neuralgia, but that it has also shown benefit in treating other forms of neuropathy. Provided patient with link to site to learn more about the patch: https://www.clark.biz/. Discussed that the patch would be placed in office and benefits usually last 3 months. Discussed that unintended exposure to capsaicin can cause severe irritation of eyes, mucous membranes, respiratory tract, and skin, but that Qutenza is a local treatment and does not have the systemic side effects of other nerve medications. Discussed that there may be pain, itching, erythema, and decreased sensory function associated with the application of Qutenza. Side effects usually subside within 1 week. A cold pack of analgesic medications can help with these side effects. Blood pressure can also be increased due to pain associated with administration of the patch.   2 patch of Qutenza was applied to the area of pain. Ice packs were applied during the  procedure to ensure patient comfort. Blood pressure was monitored every 15 minutes. The patient tolerated the procedure well. Post-procedure instructions were given and follow-up has been scheduled.    6) Fat pad atrophy: -continue red light therapy -continue gabapentin -discussed that he continues to sleep well.  -discussed PT -discussed orthotics and how these made his pain worse -continue foods with healthy fats Discussed extracorporeal shockwave therapy as a modality for treatment. Discussed that the device looks and feels like a massage gun and I would move it over the area of pain for about 10 minutes. The device releases sound waves to the area of pain and helps to improve blood flow and circulation to improve the healing process. Discuss that this initially induces inflammation and can sometimes cause short-term increase in pain. Discussed that we typically do three weekly treatments, but sometimes up to 6 if needed, and after 6 weeks long term benefits can sometimes be achieved. Discussed that this is an FDA approved device, but not covered by insurance and would cost $60 per session. Will scheduled patient for 6 consecutive appointments and can cancel latter three if benefits are achieved after first  three sessions.    -Discussed Qutenza as an option for neuropathic pain control. Discussed that this is a capsaicin patch, stronger than capsaicin cream. Discussed that it is currently approved for diabetic peripheral neuropathy and post-herpetic neuralgia, but that it has also shown benefit in treating other forms of neuropathy. Provided patient with link to site to learn more about the patch: https://www.clark.biz/. Discussed that the patch would be placed in office and benefits usually last 3 months. Discussed that unintended exposure to capsaicin can cause severe irritation of eyes, mucous membranes, respiratory tract, and skin, but that Qutenza is a local treatment and does not have the  systemic side effects of other nerve medications. Discussed that there may be pain, itching, erythema, and decreased sensory function associated with the application of Qutenza. Side effects usually subside within 1 week. A cold pack of analgesic medications can help with these side effects. Blood pressure can also be increased due to pain associated with administration of the patch.   -Discussed following foods that may reduce pain: 1) Ginger (especially studied for arthritis)- reduce leukotriene production to decrease inflammation 2) Blueberries- high in phytonutrients that decrease inflammation 3) Salmon- marine omega-3s reduce joint swelling and pain 4) Pumpkin seeds- reduce inflammation 5) dark chocolate- reduces inflammation 6) turmeric- reduces inflammation 7) tart cherries - reduce pain and stiffness 8) extra virgin olive oil - its compound olecanthal helps to block prostaglandins  9) chili peppers- can be eaten or applied topically via capsaicin 10) mint- helpful for headache, muscle aches, joint pain, and itching 11) garlic- reduces inflammation  Link to further information on diet for chronic pain: http://www.bray.com/   7) Bilateral hip pain:  -d/c gabapentin   -Discussed Qutenza as an option for neuropathic pain control. Discussed that this is a capsaicin patch, stronger than capsaicin cream. Discussed that it is currently approved for diabetic peripheral neuropathy and post-herpetic neuralgia, but that it has also shown benefit in treating other forms of neuropathy. Provided patient with link to site to learn more about the patch: https://www.clark.biz/. Discussed that the patch would be placed in office and benefits usually last 3 months. Discussed that unintended exposure to capsaicin can cause severe irritation of eyes, mucous membranes, respiratory tract, and skin, but that Qutenza is a local treatment and  does not have the systemic side effects of other nerve medications. Discussed that there may be pain, itching, erythema, and decreased sensory function associated with the application of Qutenza. Side effects usually subside within 1 week. A cold pack of analgesic medications can help with these side effects. Blood pressure can also be increased due to pain associated with administration of the patch.   2 patches of Qutenza was applied to the area of pain. Ice packs were applied during the procedure to ensure patient comfort. Blood pressure was monitored every 15 minutes. The patient tolerated the procedure well. Post-procedure instructions were given and follow-up has been scheduled.    -discussed that patient notes his pain started after starting atorvastatin, reviewed LDL from March and it is 100, messaged Dr. Veto Kemps to see if he would be comfortable with Atorvastatin being reduced to 20mg  daily. -discussed current pain, prior hip surgery.  -discussed great improvement with PT -discussed that could be secondary to lumbar facet arthropathy/retrolisthesis.  -will refer back to PT as he is continuing some home exercises but can't do them all at home. -continue red light therapy -continue collagen  -continue follow-up with ortho -discussed that when it starts, pain is very severe.  Turmeric to reduce inflammation--can be used in cooking or taken as a supplement.  Benefits of turmeric:  -Highly anti-inflammatory  -Increases antioxidants  -Improves memory, attention, brain disease  -Lowers risk of heart disease  -May help prevent cancer  -Decreases pain  -Alleviates depression  -Delays aging and decreases risk of chronic disease  -Consume with black pepper to increase absorption    Turmeric Milk Recipe:  1 cup milk  1 tsp turmeric  1 tsp cinnamon  1 tsp grated ginger (optional)  Black pepper (boosts the anti-inflammatory properties of turmeric).  1 tsp honey   8) Active  smoker: -discussed that is having a hard time quitting though he wants to -discussed medication options for cessation Continue nicotine patch as it is allowing him to smoke less

## 2022-12-16 ENCOUNTER — Encounter: Payer: Self-pay | Admitting: Physical Medicine and Rehabilitation

## 2022-12-24 ENCOUNTER — Encounter: Payer: Medicare Other | Admitting: Physical Medicine and Rehabilitation

## 2022-12-31 ENCOUNTER — Encounter
Payer: Medicare Other | Attending: Physical Medicine and Rehabilitation | Admitting: Physical Medicine and Rehabilitation

## 2022-12-31 DIAGNOSIS — M25552 Pain in left hip: Secondary | ICD-10-CM | POA: Insufficient documentation

## 2022-12-31 DIAGNOSIS — M25551 Pain in right hip: Secondary | ICD-10-CM | POA: Insufficient documentation

## 2022-12-31 DIAGNOSIS — M5441 Lumbago with sciatica, right side: Secondary | ICD-10-CM | POA: Insufficient documentation

## 2022-12-31 DIAGNOSIS — M545 Low back pain, unspecified: Secondary | ICD-10-CM | POA: Insufficient documentation

## 2022-12-31 DIAGNOSIS — G8929 Other chronic pain: Secondary | ICD-10-CM | POA: Insufficient documentation

## 2022-12-31 DIAGNOSIS — R200 Anesthesia of skin: Secondary | ICD-10-CM | POA: Insufficient documentation

## 2022-12-31 DIAGNOSIS — F17200 Nicotine dependence, unspecified, uncomplicated: Secondary | ICD-10-CM | POA: Insufficient documentation

## 2023-01-01 ENCOUNTER — Encounter (HOSPITAL_BASED_OUTPATIENT_CLINIC_OR_DEPARTMENT_OTHER): Payer: Medicare Other | Admitting: Physical Medicine and Rehabilitation

## 2023-01-01 VITALS — BP 99/61 | HR 69 | Ht 68.0 in | Wt 167.0 lb

## 2023-01-01 DIAGNOSIS — G8929 Other chronic pain: Secondary | ICD-10-CM | POA: Diagnosis present

## 2023-01-01 DIAGNOSIS — M545 Low back pain, unspecified: Secondary | ICD-10-CM

## 2023-01-01 DIAGNOSIS — M25551 Pain in right hip: Secondary | ICD-10-CM

## 2023-01-01 DIAGNOSIS — M5441 Lumbago with sciatica, right side: Secondary | ICD-10-CM | POA: Diagnosis present

## 2023-01-01 DIAGNOSIS — M25552 Pain in left hip: Secondary | ICD-10-CM | POA: Diagnosis present

## 2023-01-01 DIAGNOSIS — R200 Anesthesia of skin: Secondary | ICD-10-CM

## 2023-01-01 DIAGNOSIS — F17200 Nicotine dependence, unspecified, uncomplicated: Secondary | ICD-10-CM

## 2023-01-01 MED ORDER — TOPIRAMATE 25 MG PO TABS: 25.0000 mg | ORAL_TABLET | Freq: Every day | ORAL | 3 refills | Status: DC

## 2023-01-01 NOTE — Progress Notes (Signed)
Subjective:    Patient ID: Gerald Carpenter, male    DOB: [birth year], 72 y.o.   MRN: [id number]  HPI Gerald Carpenter is a 72 year old man who presents for follow-up of PAD with legs locking up.  1) Feet pain: -pain has greatly improved since he removed the inserts [place] -he parked in the upper structure and he walked here without issue -any further than that and he feels pain -wears supportive shoes -has been trying to wear these shoes constantly   2) Hip pain -He would like to try Qutenza here as well -hurting when walking uphill - he thinks the foot pain contributes to the hip pain -had improvement with PT and is still doing some of the exercises but some he can't do without help -He is s/p bypass- he does not note improvements in the leg after the bypass -He has decreased sensation in his left lower leg and pain. -pain is 4/10 on average and right now. Feels intermittent, dull, aching. -He does not take any medications for pain -He is a recovering alcoholic.  -he is an active smoker -his back has been killing him, also in is hip and buttock.  -2 years of benefit form his hip and back surgeries -pain is 4-5/10 -the therapy helped his walking and flexibility.  -hamstring is very tight and his wife has been helping to stretch this out.  -he does not want any pain medication, he would rather be in pain.  -his wife is returning to the [place] on 12/29. They plan to joint the gym together.  -improved greatly initially with physical therapy -he was referred to another physician -had an MRI done on his back and pathology was noted at levels 3-4.  -has has a bulge at level 3-4.  -Gerald Carpenter recommended that he follow here -rest helps.  -sometimes can only walk short distances, other times regularly.   3) Knee replacement -did PT following surgery  4) Bilateral fat pad atrophy -this is bothering  him the most right now -he feels this pain was causing him to walk funny and impacted his hips  5) Low back pain: -Qutenza helped back a little.  -has not tried lidocaine patches -failed PT, 6 epidurals, surgery, steroid dose pack -the only thing that has ever helped him has been hormones and they helped him for a year- he is not sure what hormones he received.  -he really liked his therapist but felt the therapy worsened his back pain -has an XR at [facility] and was told his spine looks bad at L5-S1 -feels that his pain is at the same place it was at before he had his spinal surgery 4 years ago.   6) Sciatica: -he stopped taking gabapentin as this did not help    Pain Inventory Average Pain 4 Pain Right Now 2 My pain is intermittent sharp, burning  In the last 24 hours, has pain interfered with the following? General activity 10 Relation with others 10 Enjoyment of life 10 What TIME of day is your pain at its worst? Morning, daytime Sleep (in general) Good  Pain is worse with: walking and standing Pain improves with: rest Relief from Meds: 0     Family History  Problem Relation Age of Onset   Pulmonary fibrosis Mother    Breast cancer Mother    Hyperlipidemia Mother    Hypertension Mother  Other Mother        Scarlet Fever   Heart disease Father    Hyperlipidemia Father    Diabetes Brother    Hypertension Brother    Breast cancer Maternal Aunt    Prostate cancer Paternal Grandfather    Hypothyroidism Daughter    Colon cancer Neg Hx    Esophageal cancer Neg Hx    Ulcerative colitis Neg Hx    Uterine cancer Neg Hx    Social History   Socioeconomic History   Marital status: Married    Spouse name: Clarene Critchley   Number of children: 3   Years of education: Not on file   Highest education level: Not on file  Occupational History   Occupation: retired  Tobacco Use   Smoking status: Every Day    Current packs/day: 0.25    Average packs/day: 0.3 packs/day  for 53.0 years (13.3 ttl pk-yrs)    Types: Cigarettes   Smokeless tobacco: Never   Tobacco comments:    had first cigarettes age  72, habit started at age 61/today he 70 cig a day  Vaping Use   Vaping status: Never Used  Substance and Sexual Activity   Alcohol use: Not Currently    Comment: 05/10/1991   Drug use: Not Currently    Comment: prior usage of "speed"  when drinking   Sexual activity: Yes  Other Topics Concern   Not on file  Social History Narrative   Recently moved here from [facility], [place]   Has total of 4 children 1 adopted   Right handed   Caffeine 3 cup a day    Live in 2 story home   Social Determinants of Health   Financial Resource Strain: Low Risk  (12/20/2021)   Overall Financial Resource Strain (CARDIA)    Difficulty of Paying Living Expenses: Not hard at all  Food Insecurity: No Food Insecurity (12/20/2021)   Hunger Vital Sign    Worried About Running Out of Food in the Last Year: Never true    Ran Out of Food in the Last Year: Never true  Transportation Needs: No Transportation Needs (12/20/2021)   PRAPARE - Administrator, Civil Service (Medical): No    Lack of Transportation (Non-Medical): No  Physical Activity: Insufficiently Active (12/20/2021)   Exercise Vital Sign    Days of Exercise per Week: 3 days    Minutes of Exercise per Session: 30 min  Stress: No Stress Concern Present (12/20/2021)   [employer] of Occupational Health - Occupational Stress Questionnaire    Feeling of Stress : Not at all  Social Connections: Moderately Integrated (12/20/2021)   Social Connection and Isolation Panel [NHANES]    Frequency of Communication with Friends and Family: More than three times a week    Frequency of Social Gatherings with Friends and Family: More than three times a week    Attends Religious Services: 1 to 4 times per year    Active Member of [employer] or Organizations: No    Attends Banker Meetings: Never    Marital Status:  Married   Past Surgical History:  Procedure Laterality Date   ABDOMINAL AORTOGRAM W/LOWER EXTREMITY N/A 06/23/2020   Procedure: ABDOMINAL AORTOGRAM W/LOWER EXTREMITY;  Surgeon: Cephus Shelling, MD;  Location: [facility];  Service: Cardiovascular;  Laterality: N/A;   ANTERIOR LAT LUMBAR FUSION Right 02/05/2019   Procedure: ATTEMPTED ANTERIOR LATERAL INTERBODY FUSION;  Surgeon: Lisbeth Renshaw, MD;  Location: [facility];  Service: Neurosurgery;  Laterality: Right;  RIGHT LATERAL TRANSPSOAS DISCECTOMY AND FUSION WITH ROBOTIC ASSISTED PEDICLE SCREW STABILIZATION, LUMBAR 4- LUMBAR 5   CATARACT EXTRACTION W/ INTRAOCULAR LENS  IMPLANT, BILATERAL Bilateral 2013   lens implants for cataracts after Lasik   COLONOSCOPY     ENDOVEIN HARVEST OF GREATER SAPHENOUS VEIN Left 06/29/2020   Procedure: HARVEST OF LEFT GREATER SAPHENOUS VEIN;  Surgeon: Cephus Shelling, MD;  Location: [facility];  Service: Vascular;  Laterality: Left;   FEMORAL-POPLITEAL BYPASS GRAFT Left 06/29/2020   Procedure: BYPASS GRAFT LEFT COMMON FEMORAL TO ABOVE KNEE POPLITEAL ARTERY;  Surgeon: Cephus Shelling, MD;  Location: [facility];  Service: Vascular;  Laterality: Left;   HEMORRHOID SURGERY N/A 04/06/2021   Procedure: HEMORRHOIDECTOMY 2 COLUMN;  Surgeon: Andria Meuse, MD;  Location: [facility];  Service: General;  Laterality: N/A;   KNEE ARTHROSCOPY Right 1980   RECTAL EXAM UNDER ANESTHESIA N/A 04/06/2021   Procedure: ANORECTAL EXAM UNDER ANESTHESIA/ excision of anal polyp;  Surgeon: Andria Meuse, MD;  Location: [facility];  Service: General;  Laterality: N/A;   TONSILLECTOMY     at a very young age, but grew back   TOTAL HIP ARTHROPLASTY Right 05/25/2019   Procedure: RIGHT TOTAL HIP ARTHROPLASTY ANTERIOR APPROACH;  Surgeon: Tarry Kos, MD;  Location: [facility];  Service: Orthopedics;  Laterality: Right;   TOTAL KNEE ARTHROPLASTY Right 09/04/2021   Procedure: RIGHT TOTAL KNEE  ARTHROPLASTY;  Surgeon: Tarry Kos, MD;  Location: [facility];  Service: Orthopedics;  Laterality: Right;   Past Medical History:  Diagnosis Date   Alcoholism (HCC)    Arthritis    Cataract    Colon polyps    Complication of anesthesia    HLD (hyperlipidemia)    Nephritis    when pt. was 7 yrs. ols   Peripheral vascular disease (HCC)    PONV (postoperative nausea and vomiting)    "Only when I have aortagram on 06/23/20".   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   Substance abuse (HCC)    BP 99/61   Pulse 69   Ht 5\' 8"  (1.727 m)   Wt 167 lb (75.8 kg)   SpO2 95%   BMI 25.39 kg/m   Opioid Risk Score:   Fall Risk Score:  `1  Depression screen [facility] 2/9     12/13/2022   10:33 AM 11/12/2022    9:44 AM 08/27/2022    9:26 AM 07/24/2022   10:00 AM 05/28/2022   11:21 AM 03/23/2022   10:10 AM 12/20/2021    9:18 AM  Depression screen PHQ 2/9  Decreased Interest 0 0 0 0 0 0 0  Down, Depressed, Hopeless 0 0 0 0 0 0 0  PHQ - 2 Score 0 0 0 0 0 0 0     Review of Systems  Constitutional: Negative.   HENT: Negative.    Eyes: Negative.   Respiratory: Negative.    Cardiovascular: Negative.   Gastrointestinal: Negative.   Endocrine: Negative.   Genitourinary: Negative.   Musculoskeletal:  Positive for back pain.       B/l foot hip pain Leg numbness or heaviness  Skin: Negative.   Allergic/Immunologic: Negative.   Neurological:  Positive for weakness and numbness.       Tingling  Hematological: Negative.   Psychiatric/Behavioral:  Positive for dysphoric mood and suicidal ideas.   All other systems reviewed and are negative.      Objective:  Physical Exam  Gen: no distress, normal appearing, 99/61, weight 167 lbs HEENT: oral mucosa pink and moist, NCAT Cardio: Reg rate Chest: normal effort, normal rate of breathing Abd: soft, non-distended Ext: no edema Psych: pleasant, normal affect Skin: intact Neuro:Decreased sensation in left lower extremity.  MSK: Knee popping.  Normal gait, good lumbar extension and flexion. B/l fat pad atrophy to feets     Assessment & Plan:   Gerald Carpenter is a 72 year old man who presents to establish care for lack of sensation in left leg following bypass for PAD.  1) Lack of sensation in left lower leg following bypass -discussed that there are not many options I know of to help with the lack of sensation -discussed that this is improving  2) Active smoker: -discussed benefits of reducing smoking.  -discussed cold [place] -discussed use of the lozenges and the patch.  -Naltrexone made him sick -his New year's resolution is to stop smoking -he quit for a year using the patch.  -discussed that he has been smoking for 60 years and he is down to 12-13 now.  -discussed that he quite for a year with the nicotine patch  3) Provided with list of supplements that can help with dyslipidemia: 1) Vitamin B3 500-4,000mg  in divided doses daily (would recommend starting low as can cause uncomfortable facial flushing if started at too high a dose) 2) Phytosterols 2.15 grams daily 3) Fermented soy 30-50 grams daily 4) EGCG (found in green tea): 500-1000mg  daily 5) Omega-3 fatty acids 3000-5,000mg  daily 6) Flax seed 40 grams daily 7) Monounsaturated fats 20-40 grams daily (olives, olive oil, nuts), also reduces cardiovascular disease 8) Sesame: 40 grams daily 9) Gamma/delta tocotrienols- a family of unsaturated forms of Vitamin E- 200mg  with dinner 10) Pantethine 900mg  daily in divided doses 11) Resveratrol 250mg  daily 12) N Acetyl Cysteine 2000mg  daily in divided doses 13) Curcumin 2000-5000mg  in divided doses daily 14) Pomegranate juice: 8 ounces daily, also helps to lower blood pressure 15) Pomegranate seeds one cup daily, also helps to lower blood pressure 16) Citrus Bergamot 1000mg  daily, also helps with glucose control and weight loss 17) Vitamin C 500mg  daily 18) Quercetin 500-1000mg  daily 19) Glutathione 20) Probiotics  60-100 billion organisms per day 21) Fiber 22) Oats 23) Aged garlic (can eat as food or supplement of 600-900mg  per day) 24) Chia seeds 25 grams per day 25) Lycopene- carotenoid found in high concentrations in tomatoes. 26) Alpha linolenic acid 27) Flavonoids and anthocyanins 28) Wogonin- flavanoid that enhances reverse cholesterol transport 29) Coenzyme Q10 30) Pantethine- derivative of Vitamin B5: 300mg  three times per day or 450mg  twice per day with or without food 31) Barley and other whole grains 32) Orange juice 33) L- carnitine 34) L- Lysine 35) L- Arginine 36) Almonds 37) Morin 38) Rutin 39) Carnosine 40) Histidine  41) Kaempferol  42) Organosulfur compounds 43) Vitamin E 44) Oleic acid 45) RBO (ferulic acid gammaoryzanol) 46) grape seed extract 47) Red wine 48) Berberine HCL 500mg  daily or twice per day- more effective and with fewer adverse effects that ezetimibe monotherapy 49) red yeast rice 2400- 4800 mg/day 50) chlorella 51) Licorice   4) Right knee pain and popping: -had good benefit from a gel injection 1.5 years ago.  -encouraged repeat viscosupplementation if pain in knee worsens -Provided with a pain relief journal and discussed that it contains foods and lifestyle tips to naturally help to improve pain. Discussed that these lifestyle strategies are also very good for health  unlike some medications which can have negative side effects. Discussed that the act of keeping a journal can be therapeutic and helpful to realize patterns what helps to trigger and alleviate pain.    5) Low back pain 2/2 facet arthropathy -discussed lidocaine patch but he defers.   -topamax prescribed  -discussed risks and benefits of surgery for his spinal stenosis  MRI of spine reviewed with him D/c gabapentin -discussed medial branch blocks and Qutenza Discussed that pain improved slightly with Qutenza  6) Fat pad atrophy: -continue red light therapy -continue  gabapentin -discussed that he continues to sleep well.  -discussed PT -discussed orthotics and how these made his pain worse -continue foods with healthy fats Discussed extracorporeal shockwave therapy as a modality for treatment. Discussed that the device looks and feels like a massage gun and I would move it over the area of pain for about 10 minutes. The device releases sound waves to the area of pain and helps to improve blood flow and circulation to improve the healing process. Discuss that this initially induces inflammation and can sometimes cause short-term increase in pain. Discussed that we typically do three weekly treatments, but sometimes up to 6 if needed, and after 6 weeks long term benefits can sometimes be achieved. Discussed that this is an FDA approved device, but not covered by insurance and would cost $60 per session. Will scheduled patient for 6 consecutive appointments and can cancel latter three if benefits are achieved after first three sessions.    -Discussed Qutenza as an option for neuropathic pain control. Discussed that this is a capsaicin patch, stronger than capsaicin cream. Discussed that it is currently approved for diabetic peripheral neuropathy and post-herpetic neuralgia, but that it has also shown benefit in treating other forms of neuropathy. Provided patient with link to site to learn more about the patch: https://www.clark.biz/. Discussed that the patch would be placed in office and benefits usually last 3 months. Discussed that unintended exposure to capsaicin can cause severe irritation of eyes, mucous membranes, respiratory tract, and skin, but that Qutenza is a local treatment and does not have the systemic side effects of other nerve medications. Discussed that there may be pain, itching, erythema, and decreased sensory function associated with the application of Qutenza. Side effects usually subside within 1 week. A cold pack of analgesic medications can help  with these side effects. Blood pressure can also be increased due to pain associated with administration of the patch.   -Discussed following foods that may reduce pain: 1) Ginger (especially studied for arthritis)- reduce leukotriene production to decrease inflammation 2) Blueberries- high in phytonutrients that decrease inflammation 3) Salmon- marine omega-3s reduce joint swelling and pain 4) Pumpkin seeds- reduce inflammation 5) dark chocolate- reduces inflammation 6) turmeric- reduces inflammation 7) tart cherries - reduce pain and stiffness 8) extra virgin olive oil - its compound olecanthal helps to block prostaglandins  9) chili peppers- can be eaten or applied topically via capsaicin 10) mint- helpful for headache, muscle aches, joint pain, and itching 11) garlic- reduces inflammation  Link to further information on diet for chronic pain: http://www.bray.com/   7) Bilateral hip pain:  -d/c gabapentin  -discussed that qutenza was not helpful  -discussed that patient notes his pain started after starting atorvastatin, reviewed LDL from March and it is 85, messaged Dr. Veto Kemps to see if he would be comfortable with Atorvastatin being reduced to 20mg  daily. Discussed with Dr. Veto Kemps that he would like him to stop somking first  to reduce PAD risk.  -discussed current pain, prior hip surgery.  -discussed great improvement with PT -discussed that could be secondary to lumbar facet arthropathy/retrolisthesis.  -will refer back to PT as he is continuing some home exercises but can't do them all at home. -continue red light therapy -continue collagen  -continue follow-up with ortho -discussed that when it starts, pain is very severe.  Turmeric to reduce inflammation--can be used in cooking or taken as a supplement.  Turmeric to reduce inflammation--can be used in cooking or taken as a supplement.  Benefits of  turmeric:  -Highly anti-inflammatory  -Increases antioxidants  -Improves memory, attention, brain disease  -Lowers risk of heart disease  -May help prevent cancer  -Decreases pain  -Alleviates depression  -Delays aging and decreases risk of chronic disease  -Consume with black pepper to increase absorption    Turmeric Milk Recipe:  1 cup milk  1 tsp turmeric  1 tsp cinnamon  1 tsp grated ginger (optional)  Black pepper (boosts the anti-inflammatory properties of turmeric).  1 tsp honey   8) Active smoker: -discussed that is having a hard time quitting though he wants to -discussed medication options for cessation Continue nicotine patch as it is allowing him to smoke less -discussed that he is down to 2 cigarettes per day! -discussed that topamax can help with smoking cessation

## 2023-01-11 ENCOUNTER — Encounter (HOSPITAL_BASED_OUTPATIENT_CLINIC_OR_DEPARTMENT_OTHER): Payer: Medicare Other | Admitting: Physical Medicine and Rehabilitation

## 2023-01-11 DIAGNOSIS — M5441 Lumbago with sciatica, right side: Secondary | ICD-10-CM

## 2023-01-11 DIAGNOSIS — G8929 Other chronic pain: Secondary | ICD-10-CM

## 2023-01-11 NOTE — Progress Notes (Signed)
Subjective:    Patient ID: Gerald Carpenter, male    DOB: 1951-01-31, 72 y.o.   MRN: [id number]  HPI An audio/video tele-health visit is felt to be the most appropriate encounter for this patient at this time. This is a follow up tele-visit via phone. The patient is at home. MD is at office. Prior to scheduling this appointment, our staff discussed the limitations of evaluation and management by telemedicine and the availability of in-person appointments. The patient expressed understanding and agreed to proceed.   Gerald Carpenter is a 72 year old man who presents for follow-up of PAD with legs locking up.  1) Feet pain: -pain has greatly improved since he removed the inserts [place]he parked in the upper structure and he walked here without issue -any further than that and he feels pain -wears supportive shoes -has been trying to wear these shoes constantly   2) Hip pain -He would like to try Qutenza here as well -hurting when walking uphill - he thinks the foot pain contributes to the hip pain -had improvement with PT and is still doing some of the exercises but some he can't do without help -He is s/p bypass- he does not note improvements in the leg after the bypass -He has decreased sensation in his left lower leg and pain. -pain is 4/10 on average and right now. Feels intermittent, dull, aching. -He does not take any medications for pain -He is a recovering alcoholic.  -he is an active smoker -his back has been killing him, also in is hip and buttock.  -2 years of benefit form his hip and back surgeries -pain is 4-5/10 -the therapy helped his walking and flexibility.  -hamstring is very tight and his wife has been helping to stretch this out.  -he does not want any pain medication, he would rather be in pain.  -his wife is returning to the [place] on 12/29. They plan to joint the gym together.   -improved greatly initially with physical therapy -he was referred to another physician -had an MRI done on his back and pathology was noted at levels 3-4.  -has has a bulge at level 3-4.  -Dr. Vernie Ammons recommended that he follow here -rest helps.  -sometimes can only walk short distances, other times regularly.   3) Knee replacement -did PT following surgery  4) Bilateral fat pad atrophy -this is bothering him the most right now -he feels this pain was causing him to walk funny and impacted his hips  5) Low back pain: -Qutenza helped back a little.  -has not tried lidocaine patches -failed PT, 6 epidurals, surgery, steroid dose pack -the only thing that has ever helped him has been hormones and they helped him for a year- he is not sure what hormones he received.  -he really liked his therapist but felt the therapy worsened his back pain -has an XR at [facility] and was told his spine looks bad at L5-S1 -feels that his pain is at the same place it was at before he had his spinal surgery 4 years ago.   6) Sciatica: -he stopped taking gabapentin as this did not help -this pain is severe and he was told he would benefit from surgery and would like to pursue this option    Pain Inventory Average Pain 4 Pain Right Now 2 My pain is intermittent sharp, burning  In  the last 24 hours, has pain interfered with the following? General activity 10 Relation with others 10 Enjoyment of life 10 What TIME of day is your pain at its worst? Morning, daytime Sleep (in general) Good  Pain is worse with: walking and standing Pain improves with: rest Relief from Meds: 0     Family History  Problem Relation Age of Onset   Pulmonary fibrosis Mother    Breast cancer Mother    Hyperlipidemia Mother    Hypertension Mother    Other Mother        Scarlet Fever   Heart disease Father    Hyperlipidemia Father    Diabetes Brother    Hypertension Brother    Breast cancer Maternal  Aunt    Prostate cancer Paternal Grandfather    Hypothyroidism Daughter    Colon cancer Neg Hx    Esophageal cancer Neg Hx    Ulcerative colitis Neg Hx    Uterine cancer Neg Hx    Social History   Socioeconomic History   Marital status: Married    Spouse name: Clarene Critchley   Number of children: 3   Years of education: Not on file   Highest education level: Not on file  Occupational History   Occupation: retired  Tobacco Use   Smoking status: Every Day    Current packs/day: 0.25    Average packs/day: 0.3 packs/day for 53.0 years (13.3 ttl pk-yrs)    Types: Cigarettes   Smokeless tobacco: Never   Tobacco comments:    had first cigarettes age  72, habit started at age 72/today he 54 cig a day  Vaping Use   Vaping status: Never Used  Substance and Sexual Activity   Alcohol use: Not Currently    Comment: 05/10/1991   Drug use: Not Currently    Comment: prior usage of "speed"  when drinking   Sexual activity: Yes  Other Topics Concern   Not on file  Social History Narrative   Recently moved here from [facility], [place]   Has total of 4 children 1 adopted   Right handed   Caffeine 3 cup a day    Live in 2 story home   Social Determinants of Health   Financial Resource Strain: Low Risk  (12/20/2021)   Overall Financial Resource Strain (CARDIA)    Difficulty of Paying Living Expenses: Not hard at all  Food Insecurity: No Food Insecurity (12/20/2021)   Hunger Vital Sign    Worried About Running Out of Food in the Last Year: Never true    Ran Out of Food in the Last Year: Never true  Transportation Needs: No Transportation Needs (12/20/2021)   PRAPARE - Administrator, Civil Service (Medical): No    Lack of Transportation (Non-Medical): No  Physical Activity: Insufficiently Active (12/20/2021)   Exercise Vital Sign    Days of Exercise per Week: 3 days    Minutes of Exercise per Session: 30 min  Stress: No Stress Concern Present (12/20/2021)   [employer] of  Occupational Health - Occupational Stress Questionnaire    Feeling of Stress : Not at all  Social Connections: Moderately Integrated (12/20/2021)   Social Connection and Isolation Panel [NHANES]    Frequency of Communication with Friends and Family: More than three times a week    Frequency of Social Gatherings with Friends and Family: More than three times a week    Attends Religious Services: 1 to 4 times per year    Active  Member of Clubs or Organizations: No    Attends Banker Meetings: Never    Marital Status: Married   Past Surgical History:  Procedure Laterality Date   ABDOMINAL AORTOGRAM W/LOWER EXTREMITY N/A 06/23/2020   Procedure: ABDOMINAL AORTOGRAM W/LOWER EXTREMITY;  Surgeon: Cephus Shelling, MD;  Location: [facility];  Service: Cardiovascular;  Laterality: N/A;   ANTERIOR LAT LUMBAR FUSION Right 02/05/2019   Procedure: ATTEMPTED ANTERIOR LATERAL INTERBODY FUSION;  Surgeon: Lisbeth Renshaw, MD;  Location: [facility];  Service: Neurosurgery;  Laterality: Right;  RIGHT LATERAL TRANSPSOAS DISCECTOMY AND FUSION WITH ROBOTIC ASSISTED PEDICLE SCREW STABILIZATION, LUMBAR 4- LUMBAR 5   CATARACT EXTRACTION W/ INTRAOCULAR LENS  IMPLANT, BILATERAL Bilateral 2013   lens implants for cataracts after Lasik   COLONOSCOPY     ENDOVEIN HARVEST OF GREATER SAPHENOUS VEIN Left 06/29/2020   Procedure: HARVEST OF LEFT GREATER SAPHENOUS VEIN;  Surgeon: Cephus Shelling, MD;  Location: [facility];  Service: Vascular;  Laterality: Left;   FEMORAL-POPLITEAL BYPASS GRAFT Left 06/29/2020   Procedure: BYPASS GRAFT LEFT COMMON FEMORAL TO ABOVE KNEE POPLITEAL ARTERY;  Surgeon: Cephus Shelling, MD;  Location: [facility];  Service: Vascular;  Laterality: Left;   HEMORRHOID SURGERY N/A 04/06/2021   Procedure: HEMORRHOIDECTOMY 2 COLUMN;  Surgeon: Andria Meuse, MD;  Location: [facility];  Service: General;  Laterality: N/A;   KNEE ARTHROSCOPY Right 1980   RECTAL EXAM  UNDER ANESTHESIA N/A 04/06/2021   Procedure: ANORECTAL EXAM UNDER ANESTHESIA/ excision of anal polyp;  Surgeon: Andria Meuse, MD;  Location: [facility];  Service: General;  Laterality: N/A;   TONSILLECTOMY     at a very young age, but grew back   TOTAL HIP ARTHROPLASTY Right 05/25/2019   Procedure: RIGHT TOTAL HIP ARTHROPLASTY ANTERIOR APPROACH;  Surgeon: Tarry Kos, MD;  Location: [facility];  Service: Orthopedics;  Laterality: Right;   TOTAL KNEE ARTHROPLASTY Right 09/04/2021   Procedure: RIGHT TOTAL KNEE ARTHROPLASTY;  Surgeon: Tarry Kos, MD;  Location: [facility];  Service: Orthopedics;  Laterality: Right;   Past Medical History:  Diagnosis Date   Alcoholism (HCC)    Arthritis    Cataract    Colon polyps    Complication of anesthesia    HLD (hyperlipidemia)    Nephritis    when pt. was 7 yrs. ols   Peripheral vascular disease (HCC)    PONV (postoperative nausea and vomiting)    "Only when I have aortagram on 06/23/20".   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   Substance abuse (HCC)    There were no vitals taken for this visit.  Opioid Risk Score:   Fall Risk Score:  `1  Depression screen [facility] 2/9     12/13/2022   10:33 AM 11/12/2022    9:44 AM 08/27/2022    9:26 AM 07/24/2022   10:00 AM 05/28/2022   11:21 AM 03/23/2022   10:10 AM 12/20/2021    9:18 AM  Depression screen PHQ 2/9  Decreased Interest 0 0 0 0 0 0 0  Down, Depressed, Hopeless 0 0 0 0 0 0 0  PHQ - 2 Score 0 0 0 0 0 0 0     Review of Systems  Constitutional: Negative.   HENT: Negative.    Eyes: Negative.   Respiratory: Negative.    Cardiovascular: Negative.   Gastrointestinal: Negative.   Endocrine: Negative.   Genitourinary: Negative.   Musculoskeletal:  Positive for back pain.  B/l foot hip pain Leg numbness or heaviness  Skin: Negative.   Allergic/Immunologic: Negative.   Neurological:  Positive for weakness and numbness.       Tingling  Hematological: Negative.    Psychiatric/Behavioral:  Positive for dysphoric mood and suicidal ideas.   All other systems reviewed and are negative.      Objective:   Physical Exam  Gen: no distress, normal appearing, 99/61, weight 167 lbs HEENT: oral mucosa pink and moist, NCAT Cardio: Reg rate Chest: normal effort, normal rate of breathing Abd: soft, non-distended Ext: no edema Psych: pleasant, normal affect Skin: intact Neuro:Decreased sensation in left lower extremity.  MSK: Knee popping. Normal gait, good lumbar extension and flexion. B/l fat pad atrophy to feets     Assessment & Plan:   Gerald Carpenter is a 72 year old man who presents to establish care for lack of sensation in left leg following bypass for PAD.  1) Lack of sensation in left lower leg following bypass -discussed that there are not many options I know of to help with the lack of sensation -discussed that this is improving  2) Active smoker: -discussed benefits of reducing smoking.  -discussed cold [place] -discussed use of the lozenges and the patch.  -Naltrexone made him sick -his New year's resolution is to stop smoking -he quit for a year using the patch.  -discussed that he has been smoking for 60 years and he is down to 12-13 now.  -discussed that he quite for a year with the nicotine patch  3) Provided with list of supplements that can help with dyslipidemia: 1) Vitamin B3 500-4,000mg  in divided doses daily (would recommend starting low as can cause uncomfortable facial flushing if started at too high a dose) 2) Phytosterols 2.15 grams daily 3) Fermented soy 30-50 grams daily 4) EGCG (found in green tea): 500-1000mg  daily 5) Omega-3 fatty acids 3000-5,000mg  daily 6) Flax seed 40 grams daily 7) Monounsaturated fats 20-40 grams daily (olives, olive oil, nuts), also reduces cardiovascular disease 8) Sesame: 40 grams daily 9) Gamma/delta tocotrienols- a family of unsaturated forms of Vitamin E- 200mg  with dinner 10) Pantethine  900mg  daily in divided doses 11) Resveratrol 250mg  daily 12) N Acetyl Cysteine 2000mg  daily in divided doses 13) Curcumin 2000-5000mg  in divided doses daily 14) Pomegranate juice: 8 ounces daily, also helps to lower blood pressure 15) Pomegranate seeds one cup daily, also helps to lower blood pressure 16) Citrus Bergamot 1000mg  daily, also helps with glucose control and weight loss 17) Vitamin C 500mg  daily 18) Quercetin 500-1000mg  daily 19) Glutathione 20) Probiotics 60-100 billion organisms per day 21) Fiber 22) Oats 23) Aged garlic (can eat as food or supplement of 600-900mg  per day) 24) Chia seeds 25 grams per day 25) Lycopene- carotenoid found in high concentrations in tomatoes. 26) Alpha linolenic acid 27) Flavonoids and anthocyanins 28) Wogonin- flavanoid that enhances reverse cholesterol transport 29) Coenzyme Q10 30) Pantethine- derivative of Vitamin B5: 300mg  three times per day or 450mg  twice per day with or without food 31) Barley and other whole grains 32) Orange juice 33) L- carnitine 34) L- Lysine 35) L- Arginine 36) Almonds 37) Morin 38) Rutin 39) Carnosine 40) Histidine  41) Kaempferol  42) Organosulfur compounds 43) Vitamin E 44) Oleic acid 45) RBO (ferulic acid gammaoryzanol) 46) grape seed extract 47) Red wine 48) Berberine HCL 500mg  daily or twice per day- more effective and with fewer adverse effects that ezetimibe monotherapy 49) red yeast rice 2400- 4800 mg/day 50)  chlorella 51) Licorice   4) Right knee pain and popping: -had good benefit from a gel injection 1.5 years ago.  -encouraged repeat viscosupplementation if pain in knee worsens -Provided with a pain relief journal and discussed that it contains foods and lifestyle tips to naturally help to improve pain. Discussed that these lifestyle strategies are also very good for health unlike some medications which can have negative side effects. Discussed that the act of keeping a journal can be  therapeutic and helpful to realize patterns what helps to trigger and alleviate pain.    5) Low back pain 2/2 facet arthropathy/spinal stenosis  -discussed current pain, that he had a recent neurosurgical eval and was recommended to have spinal surgery, that he would like to pursue spinal surgery with Dr. Nicole Kindred whom he had a positive experience with in the past, that he was told prior to his prior surgery of L4-L5 that he may eventually need surgery at the L3-L4 level as well  -referred to Dr. Conchita Paris -discussed lidocaine patch but he defers.   -topamax prescribed  -discussed risks and benefits of surgery for his spinal stenosis  MRI of spine reviewed with him D/c gabapentin -discussed medial branch blocks and Qutenza Discussed that pain improved slightly with Qutenza  6) Fat pad atrophy: -continue red light therapy -continue gabapentin -discussed that he continues to sleep well.  -discussed PT -discussed orthotics and how these made his pain worse -continue foods with healthy fats Discussed extracorporeal shockwave therapy as a modality for treatment. Discussed that the device looks and feels like a massage gun and I would move it over the area of pain for about 10 minutes. The device releases sound waves to the area of pain and helps to improve blood flow and circulation to improve the healing process. Discuss that this initially induces inflammation and can sometimes cause short-term increase in pain. Discussed that we typically do three weekly treatments, but sometimes up to 6 if needed, and after 6 weeks long term benefits can sometimes be achieved. Discussed that this is an FDA approved device, but not covered by insurance and would cost $60 per session. Will scheduled patient for 6 consecutive appointments and can cancel latter three if benefits are achieved after first three sessions.    -Discussed Qutenza as an option for neuropathic pain control. Discussed that this is a  capsaicin patch, stronger than capsaicin cream. Discussed that it is currently approved for diabetic peripheral neuropathy and post-herpetic neuralgia, but that it has also shown benefit in treating other forms of neuropathy. Provided patient with link to site to learn more about the patch: https://www.clark.biz/. Discussed that the patch would be placed in office and benefits usually last 3 months. Discussed that unintended exposure to capsaicin can cause severe irritation of eyes, mucous membranes, respiratory tract, and skin, but that Qutenza is a local treatment and does not have the systemic side effects of other nerve medications. Discussed that there may be pain, itching, erythema, and decreased sensory function associated with the application of Qutenza. Side effects usually subside within 1 week. A cold pack of analgesic medications can help with these side effects. Blood pressure can also be increased due to pain associated with administration of the patch.   -Discussed following foods that may reduce pain: 1) Ginger (especially studied for arthritis)- reduce leukotriene production to decrease inflammation 2) Blueberries- high in phytonutrients that decrease inflammation 3) Salmon- marine omega-3s reduce joint swelling and pain 4) Pumpkin seeds- reduce inflammation 5) dark chocolate-  reduces inflammation 6) turmeric- reduces inflammation 7) tart cherries - reduce pain and stiffness 8) extra virgin olive oil - its compound olecanthal helps to block prostaglandins  9) chili peppers- can be eaten or applied topically via capsaicin 10) mint- helpful for headache, muscle aches, joint pain, and itching 11) garlic- reduces inflammation  Link to further information on diet for chronic pain: http://www.bray.com/   7) Bilateral hip pain:  -d/c gabapentin  -discussed that qutenza was not helpful  -discussed that patient  notes his pain started after starting atorvastatin, reviewed LDL from March and it is 42, messaged Dr. Veto Kemps to see if he would be comfortable with Atorvastatin being reduced to 20mg  daily. Discussed with Dr. Veto Kemps that he would like him to stop somking first to reduce PAD risk.  -discussed current pain, prior hip surgery.  -discussed great improvement with PT -discussed that could be secondary to lumbar facet arthropathy/retrolisthesis.  -will refer back to PT as he is continuing some home exercises but can't do them all at home. -continue red light therapy -continue collagen  -continue follow-up with ortho -discussed that when it starts, pain is very severe.  Turmeric to reduce inflammation--can be used in cooking or taken as a supplement.  Turmeric to reduce inflammation--can be used in cooking or taken as a supplement.  Benefits of turmeric:  -Highly anti-inflammatory  -Increases antioxidants  -Improves memory, attention, brain disease  -Lowers risk of heart disease  -May help prevent cancer  -Decreases pain  -Alleviates depression  -Delays aging and decreases risk of chronic disease  -Consume with black pepper to increase absorption    Turmeric Milk Recipe:  1 cup milk  1 tsp turmeric  1 tsp cinnamon  1 tsp grated ginger (optional)  Black pepper (boosts the anti-inflammatory properties of turmeric).  1 tsp honey   8) Active smoker: -discussed that is having a hard time quitting though he wants to -discussed medication options for cessation Continue nicotine patch as it is allowing him to smoke less -discussed that he is down to 2 cigarettes per day! -discussed that topamax can help with smoking cessation  14 minutes spent in discussion of his current pain, that he had a recent neurosurgical eval and was recommended to have spinal surgery, that he would like to pursue spinal surgery with Dr. Nicole Kindred whom he had a positive experience with in the past, that  he was told prior to his prior surgery of L4-L5 that he may eventually need surgery at the L3-L4 level as well

## 2023-01-28 ENCOUNTER — Other Ambulatory Visit: Payer: Self-pay | Admitting: Family Medicine

## 2023-01-28 DIAGNOSIS — R399 Unspecified symptoms and signs involving the genitourinary system: Secondary | ICD-10-CM

## 2023-03-15 ENCOUNTER — Ambulatory Visit: Payer: Medicare Other | Admitting: Physical Medicine and Rehabilitation

## 2023-06-20 ENCOUNTER — Other Ambulatory Visit (HOSPITAL_BASED_OUTPATIENT_CLINIC_OR_DEPARTMENT_OTHER): Payer: Self-pay | Admitting: Physician Assistant

## 2023-06-20 DIAGNOSIS — I739 Peripheral vascular disease, unspecified: Secondary | ICD-10-CM

## 2023-06-20 DIAGNOSIS — Z95828 Presence of other vascular implants and grafts: Secondary | ICD-10-CM

## 2023-07-02 ENCOUNTER — Encounter: Payer: Self-pay | Admitting: Orthopaedic Surgery

## 2023-07-02 ENCOUNTER — Ambulatory Visit (INDEPENDENT_AMBULATORY_CARE_PROVIDER_SITE_OTHER): Payer: Self-pay

## 2023-07-02 ENCOUNTER — Other Ambulatory Visit: Payer: Self-pay

## 2023-07-02 ENCOUNTER — Ambulatory Visit (INDEPENDENT_AMBULATORY_CARE_PROVIDER_SITE_OTHER): Admitting: Sports Medicine

## 2023-07-02 ENCOUNTER — Ambulatory Visit (INDEPENDENT_AMBULATORY_CARE_PROVIDER_SITE_OTHER): Admitting: Orthopaedic Surgery

## 2023-07-02 DIAGNOSIS — M1612 Unilateral primary osteoarthritis, left hip: Secondary | ICD-10-CM

## 2023-07-02 DIAGNOSIS — M25552 Pain in left hip: Secondary | ICD-10-CM

## 2023-07-02 MED ORDER — METHYLPREDNISOLONE ACETATE 40 MG/ML IJ SUSP
80.0000 mg | INTRAMUSCULAR | Status: AC | PRN
  Administered 2023-07-02: 80 mg via INTRA_ARTICULAR

## 2023-07-02 MED ORDER — LIDOCAINE HCL 1 % IJ SOLN
4.0000 mL | INTRAMUSCULAR | Status: AC | PRN
Start: 2023-07-02 — End: 2023-07-02
  Administered 2023-07-02: 4 mL

## 2023-07-02 NOTE — Progress Notes (Signed)
   Procedure Note  Patient: Gerald Carpenter             Date of Birth: 10-24-1950           MRN: [id number]             Visit Date: 07/02/2023  - We did have a discussion regarding allergy to anesthetic.  He does not report this.  He states when he had dye for a lower leg/vein procedure he had an episode of nausea and vomiting following this.  He has never had an issue with lidocaine  in the past  Procedures:  Visit Diagnoses:  1. Unilateral primary osteoarthritis, left hip   2. Pain in left hip    Large Joint Inj: L hip joint on 07/02/2023 9:03 AM Indications: pain Details: 22 G 3.5 in needle, ultrasound-guided anterior approach Medications: 4 mL lidocaine  1 %; 80 mg methylPREDNISolone  acetate 40 MG/ML Outcome: tolerated well, no immediate complications  Procedure: US -guided intra-articular hip injection, Left After discussion on risks/benefits/indications and informed verbal consent was obtained, a timeout was performed. Patient was lying supine on exam table. The hip was cleaned with betadine  and alcohol swabs . Then utilizing ultrasound guidance, the patient's femoral head and neck junction was identified and subsequently injected with 4:2 lidocaine :depomedrol via an in-plane approach with ultrasound visualization of the injectate administered into the hip joint. Patient tolerated procedure well without immediate complications.  Procedure, treatment alternatives, risks and benefits explained, specific risks discussed. Consent was given by the patient. Immediately prior to procedure a time out was called to verify the correct patient, procedure, equipment, support staff and site/side marked as required. Patient was prepped and draped in the usual sterile fashion.     - patient tolerated procedure well, discussed post-injection protocol - evaluated him 5-10 mins post injection and patient doing well without immediate adverse effects - follow-up with Dr. Jeana Michaels as indicated;  I am happy to see them as needed  Shauna Del, DO Primary Care Sports Medicine Physician  [facility] - Orthopedics  This note was dictated using Dragon naturally speaking software and may contain errors in syntax, spelling, or content which have not been identified prior to signing this note.

## 2023-07-02 NOTE — Progress Notes (Signed)
 Office Visit Note   Patient: Gerald Carpenter           Date of Birth: May 13, 1950           MRN: [id number] Visit Date: 07/02/2023              Requested by: Graig Lawyer, MD [address] [place],  [place] [address] PCP: Graig Lawyer, MD   Assessment & Plan: Visit Diagnoses:  1. Pain in left hip     Plan: History of Present Illness Gerald Carpenter is a 73 year old male with a history of right hip replacement who presents with left hip pain.  He experiences pain in the left hip, localized to the buttock, worsened by movements such as rotation. The pain is severe enough to interfere with sleep. He uses a cane for ambulation due to the pain, which has also started affecting his knee. Numbness and tingling down the leg began the previous night. An MRI of the back three months ago evaluated for sciatica at the L3 and L4 levels. Steroids provided relief until symptoms returned with increased intensity about a week and a half ago. No groin pain is present on either side.  Physical Exam MUSCULOSKELETAL: Left hip log roll causes pain. Left hip flexion without pain. Left hip rotation causes buttock pain. No groin pain on left hip examination.  No trochanteric tenderness.  Results RADIOLOGY Hip X-ray: Moderate osteoarthritis  Assessment and Plan Left hip pain with moderate arthritis Severe left hip pain with moderate arthritis, affecting sleep and mobility. Differential includes hip joint pain or referred pain from the back. Cortisone injection recommended to identify pain source. - Administer cortisone injection in left hip joint to assess pain relief and determine origin. - Refer to Dr. Vaughn Georges for hip injection with ultrasound guidance.  Sciatica Sciatica considered due to numbness and tingling. Previous MRI showed L3-L4 issues. Lack of relief from hip injection suggests back origin. - If no relief from hip injection, refer to back specialist for further  evaluation.  Follow-Up Instructions: No follow-ups on file.   Orders:  Orders Placed This Encounter  Procedures   XR HIP UNILAT W OR W/O PELVIS 2-3 VIEWS LEFT   No orders of the defined types were placed in this encounter.   Subjective: Chief Complaint  Patient presents with   Left Hip - Pain    HPI  Review of Systems  Constitutional: Negative.   HENT: Negative.    Eyes: Negative.   Respiratory: Negative.    Cardiovascular: Negative.   Gastrointestinal: Negative.   Endocrine: Negative.   Genitourinary: Negative.   Skin: Negative.   Allergic/Immunologic: Negative.   Neurological: Negative.   Hematological: Negative.   Psychiatric/Behavioral: Negative.    All other systems reviewed and are negative.    Objective: Vital Signs: There were no vitals taken for this visit.  Physical Exam Vitals and nursing note reviewed.  Constitutional:      Appearance: He is well-developed.  HENT:     Head: Normocephalic and atraumatic.  Eyes:     Pupils: Pupils are equal, round, and reactive to light.  Pulmonary:     Effort: Pulmonary effort is normal.  Abdominal:     Palpations: Abdomen is soft.  Musculoskeletal:        General: Normal range of motion.     Cervical back: Neck supple.  Skin:    General: Skin is warm.  Neurological:     Mental Status: He is alert and  oriented to person, place, and time.  Psychiatric:        Behavior: Behavior normal.        Thought Content: Thought content normal.        Judgment: Judgment normal.     Ortho Exam  Specialty Comments:  No specialty comments available.  Imaging: XR HIP UNILAT W OR W/O PELVIS 2-3 VIEWS LEFT Result Date: 07/02/2023 X-rays of the left hip show osteoarthritic changes.  No acute abnormalities.    PMFS History: Patient Active Problem List   Diagnosis Date Noted   Status post total right knee replacement 09/04/2021   Leukocytosis 10/11/2020   PAD (peripheral artery disease) (HCC) 06/29/2020    Prediabetes 04/22/2020   Chronic kidney disease, stage 3a (HCC) 04/15/2020   Severe tobacco dependence 04/12/2020   Lower urinary tract symptoms (LUTS) 04/12/2020   History of colon polyps 04/12/2020   Atherosclerosis of native arteries of extremity with intermittent claudication (HCC) 03/01/2020   Pain in both feet 07/31/2019   Status post total replacement of right hip 05/25/2019   Lumbar radiculopathy 02/05/2019   Past Medical History:  Diagnosis Date   Alcoholism (HCC)    Arthritis    Cataract    Colon polyps    Complication of anesthesia    HLD (hyperlipidemia)    Nephritis    when pt. was 7 yrs. ols   Peripheral vascular disease (HCC)    PONV (postoperative nausea and vomiting)    "Only when I have aortagram on 06/23/20".   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   Substance abuse (HCC)     Family History  Problem Relation Age of Onset   Pulmonary fibrosis Mother    Breast cancer Mother    Hyperlipidemia Mother    Hypertension Mother    Other Mother        Scarlet Fever   Heart disease Father    Hyperlipidemia Father    Diabetes Brother    Hypertension Brother    Breast cancer Maternal Aunt    Prostate cancer Paternal Grandfather    Hypothyroidism Daughter    Colon cancer Neg Hx    Esophageal cancer Neg Hx    Ulcerative colitis Neg Hx    Uterine cancer Neg Hx     Past Surgical History:  Procedure Laterality Date   ABDOMINAL AORTOGRAM W/LOWER EXTREMITY N/A 06/23/2020   Procedure: ABDOMINAL AORTOGRAM W/LOWER EXTREMITY;  Surgeon: Young Hensen, MD;  Location: [facility];  Service: Cardiovascular;  Laterality: N/A;   ANTERIOR LAT LUMBAR FUSION Right 02/05/2019   Procedure: ATTEMPTED ANTERIOR LATERAL INTERBODY FUSION;  Surgeon: Augusto Blonder, MD;  Location: [facility];  Service: Neurosurgery;  Laterality: Right;  RIGHT LATERAL TRANSPSOAS DISCECTOMY AND FUSION WITH ROBOTIC ASSISTED PEDICLE SCREW STABILIZATION, LUMBAR 4- LUMBAR 5   CATARACT  EXTRACTION W/ INTRAOCULAR LENS  IMPLANT, BILATERAL Bilateral 2013   lens implants for cataracts after Lasik   COLONOSCOPY     ENDOVEIN HARVEST OF GREATER SAPHENOUS VEIN Left 06/29/2020   Procedure: HARVEST OF LEFT GREATER SAPHENOUS VEIN;  Surgeon: Young Hensen, MD;  Location: [facility];  Service: Vascular;  Laterality: Left;   FEMORAL-POPLITEAL BYPASS GRAFT Left 06/29/2020   Procedure: BYPASS GRAFT LEFT COMMON FEMORAL TO ABOVE KNEE POPLITEAL ARTERY;  Surgeon: Young Hensen, MD;  Location: [facility];  Service: Vascular;  Laterality: Left;   HEMORRHOID SURGERY N/A 04/06/2021   Procedure: HEMORRHOIDECTOMY 2 COLUMN;  Surgeon: Melvenia Stabs, MD;  [facility];  Service: General;  Laterality: N/A;   KNEE ARTHROSCOPY Right 1980   RECTAL EXAM UNDER ANESTHESIA N/A 04/06/2021   Procedure: ANORECTAL EXAM UNDER ANESTHESIA/ excision of anal polyp;  Surgeon: Melvenia Stabs, MD;  [facility];  Service: General;  Laterality: N/A;   TONSILLECTOMY     at a very young age, but grew back   TOTAL HIP ARTHROPLASTY Right 05/25/2019   Procedure: RIGHT TOTAL HIP ARTHROPLASTY ANTERIOR APPROACH;  Surgeon: Wes Hamman, MD;  Location: [facility];  Service: Orthopedics;  Laterality: Right;   TOTAL KNEE ARTHROPLASTY Right 09/04/2021   Procedure: RIGHT TOTAL KNEE ARTHROPLASTY;  Surgeon: Wes Hamman, MD;  [facility]: [facility];  Service: Orthopedics;  Laterality: Right;   Social History   Occupational History   Occupation: retired  Tobacco Use   Smoking status: Every Day    Current packs/day: 0.25    Average packs/day: 0.3 packs/day for 53.0 years (13.3 ttl pk-yrs)    Types: Cigarettes   Smokeless tobacco: Never   Tobacco comments:    had first cigarettes age  23, habit started at age 50/today he 46 cig a day  Vaping Use   Vaping status: Never Used  Substance and Sexual Activity   Alcohol use: Not Currently    Comment: 05/10/1991   Drug use: Not Currently     Comment: prior usage of "speed"  when drinking   Sexual activity: Yes

## 2023-07-09 ENCOUNTER — Ambulatory Visit (HOSPITAL_BASED_OUTPATIENT_CLINIC_OR_DEPARTMENT_OTHER)
Admission: RE | Admit: 2023-07-09 | Discharge: 2023-07-09 | Disposition: A | Discharge status: Home / Self Care | Payer: Medicare Other | Source: Ambulatory Visit | Attending: Physician Assistant | Admitting: Physician Assistant

## 2023-07-09 ENCOUNTER — Ambulatory Visit (INDEPENDENT_AMBULATORY_CARE_PROVIDER_SITE_OTHER): Payer: Medicare Other | Admitting: Physician Assistant

## 2023-07-09 ENCOUNTER — Ambulatory Visit (HOSPITAL_COMMUNITY)
Admission: RE | Admit: 2023-07-09 | Discharge: 2023-07-09 | Disposition: A | Discharge status: Home / Self Care | Payer: Medicare Other | Source: Ambulatory Visit | Attending: Physician Assistant | Admitting: Physician Assistant

## 2023-07-09 VITALS — BP 147/79 | HR 75 | Temp 97.9°F | Resp 18 | Ht 67.0 in | Wt 163.3 lb

## 2023-07-09 DIAGNOSIS — I739 Peripheral vascular disease, unspecified: Secondary | ICD-10-CM | POA: Insufficient documentation

## 2023-07-09 DIAGNOSIS — Z95828 Presence of other vascular implants and grafts: Secondary | ICD-10-CM | POA: Diagnosis present

## 2023-07-09 DIAGNOSIS — I70213 Atherosclerosis of native arteries of extremities with intermittent claudication, bilateral legs: Secondary | ICD-10-CM

## 2023-07-09 LAB — VAS US ABI WITH/WO TBI
Left ABI: 1.23
Right ABI: 1.02

## 2023-07-09 NOTE — Progress Notes (Signed)
 Office Note     CC:  follow up Requesting Provider:  Graig Lawyer, MD  HPI: Gerald Carpenter is a 73 y.o. ([birth year]) male who presents for surveillance of PAD with history of left femoral to above-the-knee popliteal bypass with vein by Dr. Fulton Job in May 2022.  This was performed due to lifestyle limiting claudication of the left lower extremity.  He is currently experiencing pain in his left hip and possibly lumbar spine.  He has history of L4-L5 fusion about 5 years ago.  He is currently being worked up by his orthopedic and possibly being referred back to his spine surgeon due to ongoing pain.  He denies any rest pain in his feet.  He is on a daily aspirin .  He is also on a daily statin.  He smokes 2 cigarettes a day.   Past Medical History:  Diagnosis Date   Alcoholism (HCC)    Arthritis    Cataract    Colon polyps    Complication of anesthesia    HLD (hyperlipidemia)    Nephritis    when pt. was 7 yrs. ols   Peripheral vascular disease (HCC)    PONV (postoperative nausea and vomiting)    "Only when I have aortagram on 06/23/20".   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   Substance abuse [facility])     Past Surgical History:  Procedure Laterality Date   ABDOMINAL AORTOGRAM W/LOWER EXTREMITY N/A 06/23/2020   Procedure: ABDOMINAL AORTOGRAM W/LOWER EXTREMITY;  Surgeon: Young Hensen, MD;  [facility];  Service: Cardiovascular;  Laterality: N/A;   ANTERIOR LAT LUMBAR FUSION Right 02/05/2019   Procedure: ATTEMPTED ANTERIOR LATERAL INTERBODY FUSION;  Surgeon: Augusto Blonder, MD;  [facility];  Service: Neurosurgery;  Laterality: Right;  RIGHT LATERAL TRANSPSOAS DISCECTOMY AND FUSION WITH ROBOTIC ASSISTED PEDICLE SCREW STABILIZATION, LUMBAR 4- LUMBAR 5   CATARACT EXTRACTION W/ INTRAOCULAR LENS  IMPLANT, BILATERAL Bilateral 2013   lens implants for cataracts after Lasik   COLONOSCOPY     ENDOVEIN HARVEST OF GREATER SAPHENOUS VEIN Left 06/29/2020    Procedure: HARVEST OF LEFT GREATER SAPHENOUS VEIN;  Surgeon: Young Hensen, MD;  Location: [facility];  Service: Vascular;  Laterality: Left;   FEMORAL-POPLITEAL BYPASS GRAFT Left 06/29/2020   Procedure: BYPASS GRAFT LEFT COMMON FEMORAL TO ABOVE KNEE POPLITEAL ARTERY;  Surgeon: Young Hensen, MD;  [facility];  Service: Vascular;  Laterality: Left;   HEMORRHOID SURGERY N/A 04/06/2021   Procedure: HEMORRHOIDECTOMY 2 COLUMN;  Surgeon: Melvenia Stabs, MD;  Location: [facility];  Service: General;  Laterality: N/A;   KNEE ARTHROSCOPY Right 1980   RECTAL EXAM UNDER ANESTHESIA N/A 04/06/2021   Procedure: ANORECTAL EXAM UNDER ANESTHESIA/ excision of anal polyp;  Surgeon: Melvenia Stabs, MD;  Location: [facility];  Service: General;  Laterality: N/A;   TONSILLECTOMY     at a very young age, but grew back   TOTAL HIP ARTHROPLASTY Right 05/25/2019   Procedure: RIGHT TOTAL HIP ARTHROPLASTY ANTERIOR APPROACH;  Surgeon: Wes Hamman, MD;  Location: [facility];  Service: Orthopedics;  Laterality: Right;   TOTAL KNEE ARTHROPLASTY Right 09/04/2021   Procedure: RIGHT TOTAL KNEE ARTHROPLASTY;  Surgeon: Wes Hamman, MD;  Location: [facility];  Service: Orthopedics;  Laterality: Right;    Social History   Socioeconomic History   Marital status: Married    Spouse name: Emilie Harden   Number of children: 3   Years of education: Not  on file   Highest education level: 12th grade  Occupational History   Occupation: retired  Tobacco Use   Smoking status: Every Day    Current packs/day: 0.25    Average packs/day: 0.3 packs/day for 53.0 years (13.3 ttl pk-yrs)    Types: Cigarettes   Smokeless tobacco: Never   Tobacco comments:    had first cigarettes age  22, habit started at age 5/today he 55 cig a day  Vaping Use   Vaping status: Never Used  Substance and Sexual Activity   Alcohol use: Not Currently    Comment: 05/10/1991   Drug use: Not Currently    Comment:  prior usage of "speed"  when drinking   Sexual activity: Yes  Other Topics Concern   Not on file  Social History Narrative   [place] moved here from [facility]   Has total of 4 children 1 adopted   Right handed   Caffeine 3 cup a day    Live in 2 story home   Social Drivers of Health   Financial Resource Strain: Low Risk  (07/08/2023)   Overall Financial Resource Strain (CARDIA)    Difficulty of Paying Living Expenses: Not hard at all  Food Insecurity: No Food Insecurity (07/08/2023)   Hunger Vital Sign    Worried About Running Out of Food in the Last Year: Never true    Ran Out of Food in the Last Year: Never true  Transportation Needs: No Transportation Needs (07/08/2023)   PRAPARE - Administrator, Civil Service (Medical): No    Lack of Transportation (Non-Medical): No  Physical Activity: Insufficiently Active (07/08/2023)   Exercise Vital Sign    Days of Exercise per Week: 2 days    Minutes of Exercise per Session: 10 min  Stress: No Stress Concern Present (07/08/2023)   [employer] of Occupational Health - Occupational Stress Questionnaire    Feeling of Stress : Not at all  Social Connections: Moderately Isolated (07/08/2023)   Social Connection and Isolation Panel [NHANES]    Frequency of Communication with Friends and Family: More than three times a week    Frequency of Social Gatherings with Friends and Family: More than three times a week    Attends Religious Services: Never    Database administrator or Organizations: No    Attends Engineer, structural: Not on file    Marital Status: Married  Catering manager Violence: Not At Risk (12/20/2021)   Humiliation, Afraid, Rape, and Kick questionnaire    Fear of Current or Ex-Partner: No    Emotionally Abused: No    Physically Abused: No    Sexually Abused: No    Family History  Problem Relation Age of Onset   Pulmonary fibrosis Mother    Breast cancer Mother    Hyperlipidemia Mother     Hypertension Mother    Other Mother        Scarlet Fever   Heart disease Father    Hyperlipidemia Father    Diabetes Brother    Hypertension Brother    Breast cancer Maternal Aunt    Prostate cancer Paternal Grandfather    Hypothyroidism Daughter    Colon cancer Neg Hx    Esophageal cancer Neg Hx    Ulcerative colitis Neg Hx    Uterine cancer Neg Hx     Current Outpatient Medications  Medication Sig Dispense Refill   aspirin  EC 81 MG tablet Take 81 mg by mouth daily. Swallow  whole.     atorvastatin  (LIPITOR) 40 MG tablet TAKE 1 TABLET(40 MG) BY MOUTH DAILY 90 tablet 3   tamsulosin  (FLOMAX ) 0.4 MG CAPS capsule TAKE 1 CAPSULE(0.4 MG) BY MOUTH IN THE MORNING 90 capsule 3   Wheat Dextrin-Vit B6-B12-FA (BENEFIBER PLUS HEART HEALTH PO) Take 2 Scoops by mouth daily.     gabapentin  (NEURONTIN ) 300 MG capsule TAKE 1 CAPSULE BY MOUTH EVERY NIGHT AT BEDTIME FOR 30 DAYS, THEN 1 CAPSULE TWICE DAILY X30 DAYS, THEN 1 CAPSULE THREE TIMES DAILY (Patient not taking: Reported on 07/09/2023) 180 capsule 0   methocarbamol  (ROBAXIN -750) 750 MG tablet Take 1 tablet (750 mg total) by mouth 2 (two) times daily as needed for muscle spasms. (Patient not taking: Reported on 07/09/2023) 20 tablet 2   nicotine  (NICODERM CQ  - DOSED IN MG/24 HOURS) 21 mg/24hr patch Place 1 patch (21 mg total) onto the skin daily. (Patient not taking: Reported on 07/09/2023) 28 patch 3   topiramate  (TOPAMAX ) 25 MG tablet Take 1 tablet (25 mg total) by mouth daily. (Patient not taking: Reported on 07/09/2023) 30 tablet 3   No current facility-administered medications for this visit.    Allergies  Allergen Reactions   Anesthetics, Amide Nausea And Vomiting     REVIEW OF SYSTEMS:   [X]  denotes positive finding, [ ]  denotes negative finding Cardiac  Comments:  Chest pain or chest pressure:    Shortness of breath upon exertion:    Short of breath when lying flat:    Irregular heart rhythm:        Vascular    Pain in calf, thigh,  or hip brought on by ambulation:    Pain in feet at night that wakes you up from your sleep:     Blood clot in your veins:    Leg swelling:         Pulmonary    Oxygen at home:    Productive cough:     Wheezing:         Neurologic    Sudden weakness in arms or legs:     Sudden numbness in arms or legs:     Sudden onset of difficulty speaking or slurred speech:    Temporary loss of vision in one eye:     Problems with dizziness:         Gastrointestinal    Blood in stool:     Vomited blood:         Genitourinary    Burning when urinating:     Blood in urine:        Psychiatric    Major depression:         Hematologic    Bleeding problems:    Problems with blood clotting too easily:        Skin    Rashes or ulcers:        Constitutional    Fever or chills:      PHYSICAL EXAMINATION:  Vitals:   07/09/23 0845  BP: (!) 147/79  Pulse: 75  Resp: 18  Temp: 97.9 F (36.6 C)  TempSrc: Temporal  SpO2: 96%  Weight: 163 lb 4.8 oz (74.1 kg)  Height: 5\' 7"  (1.702 m)    General:  WDWN in NAD; vital signs documented above Gait: Not observed HENT: WNL, normocephalic Pulmonary: normal non-labored breathing , without Rales, rhonchi,  wheezing Cardiac: regular HR Abdomen: soft, NT, no masses Skin: without rashes Vascular Exam/Pulses: palpable L ATA Extremities: without ischemic changes, without  Gangrene , without cellulitis; without open wounds;  Musculoskeletal: no muscle wasting or atrophy  Neurologic: A&O X 3 Psychiatric:  The pt has Normal affect.   Non-Invasive Vascular Imaging:   Left leg bypass duplex demonstrates a widely patent bypass without any areas of hemodynamically significant stenosis  ABI/TBIToday's ABIToday's TBIPrevious ABIPrevious TBI  +-------+-----------+-----------+------------+------------+  Right 1.02       0.36       1.02        0.70          +-------+-----------+-----------+------------+------------+  Left  1.23        0.20       0.95        0.59          +-------+-----------+-----------+------------+------------+     ASSESSMENT/PLAN:: 73 y.o. male here for follow up for surveillance of PAD with history of left leg femoral to above-the-knee popliteal bypass with vein  Left lower extremity is well-perfused with a widely patent bypass by duplex.  Unfortunately he is having trouble with his lower back and left hip currently.  He is following up with his orthopedic and potentially his spine surgeon in the near future.  He will continue his aspirin  and statin daily.  Encouraged him to walk for exercise is much as tolerable.  We will repeat left leg bypass duplex and ABI in 1 year.   Cordie Deters, PA-C [facility] [phone]  Clinic MD:   Fulton Job

## 2023-07-12 ENCOUNTER — Ambulatory Visit (INDEPENDENT_AMBULATORY_CARE_PROVIDER_SITE_OTHER): Payer: Medicare Other

## 2023-07-12 DIAGNOSIS — Z0000 Encounter for general adult medical examination without abnormal findings: Secondary | ICD-10-CM

## 2023-07-12 NOTE — Progress Notes (Signed)
 Subjective:   Gerald Carpenter is a 72 y.o. who presents for a Medicare Wellness preventive visit.  As a reminder, Annual Wellness Visits don't include a physical exam, and some assessments may be limited, especially if this visit is performed virtually. We may recommend an in-person follow-up visit with your provider if needed.  Visit Complete: Virtual I connected with  Gerald Carpenter on 07/12/23 by a video and audio enabled telemedicine application and verified that I am speaking with the correct person using two identifiers.  Patient Location: Home  Provider Location: Office/Clinic  I discussed the limitations of evaluation and management by telemedicine. The patient expressed understanding and agreed to proceed.  Vital Signs: Because this visit was a virtual/telehealth visit, some criteria may be missing or patient reported. Any vitals not documented were not able to be obtained and vitals that have been documented are patient reported.    Persons Participating in Visit: Patient.  AWV Questionnaire: Yes: Patient Medicare AWV questionnaire was completed by the patient on 07/08/2023; I have confirmed that all information answered by patient is correct and no changes since this date.  Cardiac Risk Factors include: advanced age (>22men, >50 women);male gender     Objective:     Today's Vitals   07/12/23 0804  PainSc: 1    There is no height or weight on file to calculate BMI.     07/12/2023    8:13 AM 09/06/2022    8:05 AM 04/04/2022    8:45 AM 12/20/2021    9:20 AM 10/17/2021    8:03 AM 09/04/2021    1:44 PM 08/30/2021   10:19 AM  Advanced Directives  Does Patient Have a Medical Advance Directive? No No No Yes No No No  Type of Theme park manager;Living will     Copy of Healthcare Power of Attorney in Chart?    No - copy requested     Would patient like information on creating a medical advance directive? No - Patient declined No - Patient  declined    No - Patient declined No - Patient declined    Current Medications (verified) Outpatient Encounter Medications as of 07/12/2023  Medication Sig   aspirin  EC 81 MG tablet Take 81 mg by mouth daily. Swallow whole.   atorvastatin  (LIPITOR) 40 MG tablet TAKE 1 TABLET(40 MG) BY MOUTH DAILY   tamsulosin  (FLOMAX ) 0.4 MG CAPS capsule TAKE 1 CAPSULE(0.4 MG) BY MOUTH IN THE MORNING   Wheat Dextrin-Vit B6-B12-FA (BENEFIBER PLUS HEART HEALTH PO) Take 2 Scoops by mouth daily.   gabapentin  (NEURONTIN ) 300 MG capsule TAKE 1 CAPSULE BY MOUTH EVERY NIGHT AT BEDTIME FOR 30 DAYS, THEN 1 CAPSULE TWICE DAILY X30 DAYS, THEN 1 CAPSULE THREE TIMES DAILY (Patient not taking: Reported on 07/12/2023)   methocarbamol  (ROBAXIN -750) 750 MG tablet Take 1 tablet (750 mg total) by mouth 2 (two) times daily as needed for muscle spasms. (Patient not taking: Reported on 07/12/2023)   nicotine  (NICODERM CQ  - DOSED IN MG/24 HOURS) 21 mg/24hr patch Place 1 patch (21 mg total) onto the skin daily. (Patient not taking: Reported on 07/12/2023)   topiramate  (TOPAMAX ) 25 MG tablet Take 1 tablet (25 mg total) by mouth daily. (Patient not taking: Reported on 07/12/2023)   No facility-administered encounter medications on file as of 07/12/2023.    Allergies (verified) Anesthetics, amide   History: Past Medical History:  Diagnosis Date   Alcoholism (HCC)    Arthritis    Cataract  Colon polyps    Complication of anesthesia    HLD (hyperlipidemia)    Nephritis    when pt. was 7 yrs. ols   Peripheral vascular disease (HCC)    PONV (postoperative nausea and vomiting)    "Only when I have aortagram on 06/23/20".   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   Substance abuse [facility])    Past Surgical History:  Procedure Laterality Date   ABDOMINAL AORTOGRAM W/LOWER EXTREMITY N/A 06/23/2020   Procedure: ABDOMINAL AORTOGRAM W/LOWER EXTREMITY;  Surgeon: Young Hensen, MD;  Location: [facility];  Service:  Cardiovascular;  Laterality: N/A;   ANTERIOR LAT LUMBAR FUSION Right 02/05/2019   Procedure: ATTEMPTED ANTERIOR LATERAL INTERBODY FUSION;  Surgeon: Augusto Blonder, MD;  Location: [facility] OR;  Service: Neurosurgery;  Laterality: Right;  RIGHT LATERAL TRANSPSOAS DISCECTOMY AND FUSION WITH ROBOTIC ASSISTED PEDICLE SCREW STABILIZATION, LUMBAR 4- LUMBAR 5   CATARACT EXTRACTION W/ INTRAOCULAR LENS  IMPLANT, BILATERAL Bilateral 2013   lens implants for cataracts after Lasik   COLONOSCOPY     ENDOVEIN HARVEST OF GREATER SAPHENOUS VEIN Left 06/29/2020   Procedure: HARVEST OF LEFT GREATER SAPHENOUS VEIN;  Surgeon: Young Hensen, MD;  Location: [facility] OR;  Service: Vascular;  Laterality: Left;   EYE SURGERY  2013   FEMORAL-POPLITEAL BYPASS GRAFT Left 06/29/2020   Procedure: BYPASS GRAFT LEFT COMMON FEMORAL TO ABOVE KNEE POPLITEAL ARTERY;  Surgeon: Young Hensen, MD;  Location: [facility] OR;  Service: Vascular;  Laterality: Left;   HEMORRHOID SURGERY N/A 04/06/2021   Procedure: HEMORRHOIDECTOMY 2 COLUMN;  Surgeon: Melvenia Stabs, MD;  Location: [facility];  Service: General;  Laterality: N/A;   KNEE ARTHROSCOPY Right 1980   RECTAL EXAM UNDER ANESTHESIA N/A 04/06/2021   Procedure: ANORECTAL EXAM UNDER ANESTHESIA/ excision of anal polyp;  Surgeon: Melvenia Stabs, MD;  Location: [facility];  Service: General;  Laterality: N/A;   TONSILLECTOMY     at a very young age, but grew back   TOTAL HIP ARTHROPLASTY Right 05/25/2019   Procedure: RIGHT TOTAL HIP ARTHROPLASTY ANTERIOR APPROACH;  Surgeon: Wes Hamman, MD;  Location: [facility] OR;  Service: Orthopedics;  Laterality: Right;   TOTAL KNEE ARTHROPLASTY Right 09/04/2021   Procedure: RIGHT TOTAL KNEE ARTHROPLASTY;  Surgeon: Wes Hamman, MD;  Location: [facility];  Service: Orthopedics;  Laterality: Right;   Family History  Problem Relation Age of Onset   Pulmonary fibrosis Mother    Breast cancer Mother    Hyperlipidemia  Mother    Hypertension Mother    Other Mother        Scarlet Fever   Heart disease Father    Hyperlipidemia Father    Diabetes Brother    Hypertension Brother    Hypertension Brother    Breast cancer Maternal Aunt    Prostate cancer Paternal Grandfather    Hypothyroidism Daughter    Alcohol abuse Son    Colon cancer Neg Hx    Esophageal cancer Neg Hx    Ulcerative colitis Neg Hx    Uterine cancer Neg Hx    Social History   Socioeconomic History   Marital status: Married    Spouse name: Emilie Harden   Number of children: 3   Years of education: Not on file   Highest education level: 12th grade  Occupational History   Occupation: retired  Tobacco Use   Smoking status: Some Days    Current packs/day: 0.25    Average packs/day: 0.3  packs/day for 53.2 years (13.3 ttl pk-yrs)    Types: Cigarettes   Smokeless tobacco: Never   Tobacco comments:    had first cigarettes age  65, habit started at age 42/today he 42 cig a day  Vaping Use   Vaping status: Never Used  Substance and Sexual Activity   Alcohol use: Not Currently    Comment: 05/10/1991   Drug use: Not Currently    Comment: prior usage of "speed"  when drinking   Sexual activity: Yes  Other Topics Concern   Not on file  Social History Narrative   Recently moved here from [facility], [place]   Has total of 4 children 1 adopted   Right handed   Caffeine 3 cup a day    Live in 2 story home   Social Drivers of Health   Financial Resource Strain: Low Risk  (07/08/2023)   Overall Financial Resource Strain (CARDIA)    Difficulty of Paying Living Expenses: Not hard at all  Food Insecurity: No Food Insecurity (07/08/2023)   Hunger Vital Sign    Worried About Running Out of Food in the Last Year: Never true    Ran Out of Food in the Last Year: Never true  Transportation Needs: No Transportation Needs (07/08/2023)   PRAPARE - Administrator, Civil Service (Medical): No    Lack of Transportation (Non-Medical): No   Physical Activity: Insufficiently Active (07/08/2023)   Exercise Vital Sign    Days of Exercise per Week: 2 days    Minutes of Exercise per Session: 10 min  Stress: No Stress Concern Present (07/08/2023)   [employer] of Occupational Health - Occupational Stress Questionnaire    Feeling of Stress : Not at all  Social Connections: Moderately Isolated (07/08/2023)   Social Connection and Isolation Panel [NHANES]    Frequency of Communication with Friends and Family: More than three times a week    Frequency of Social Gatherings with Friends and Family: More than three times a week    Attends Religious Services: Never    Database administrator or Organizations: No    Attends Engineer, structural: Not on file    Marital Status: Married    Tobacco Counseling Ready to quit: No Counseling given: Not Answered Tobacco comments: had first cigarettes age  13, habit started at age 42/today he 5 cig a day    Clinical Intake:  Pre-visit preparation completed: Yes  Pain : 0-10 Pain Score: 1  Pain Type: Chronic pain Pain Location: Buttocks Pain Orientation: Left Pain Descriptors / Indicators: Aching Pain Onset: More than a month ago Pain Frequency: Constant     Nutritional Risks: None Diabetes: No  Lab Results  Component Value Date   HGBA1C 6.1 04/25/2022   HGBA1C 5.8 04/21/2020     How often do you need to have someone help you when you read instructions, pamphlets, or other written materials from your doctor or pharmacy?: 1 - Never  Interpreter Needed?: No  Information entered by :: NAllen LPN   Activities of Daily Living     07/12/2023    8:08 AM 01/17/2023    9:01 AM  In your present state of health, do you have any difficulty performing the following activities:  Hearing? 0 0  Vision? 0 0  Difficulty concentrating or making decisions? 0 0  Walking or climbing stairs? 0 1  Dressing or bathing? 0 0  Doing errands, shopping? 0 1  Preparing Food and  eating ? N N  Using the Toilet? N N  In the past six months, have you accidently leaked urine? N N  Do you have problems with loss of bowel control? N N  Managing your Medications? N N  Managing your Finances? N N  Housekeeping or managing your Housekeeping? N N    Patient Care Team: Graig Lawyer, MD as PCP - General (Family Medicine) Young Hensen, MD as Consulting Physician (Vascular Surgery) Wes Hamman, MD as Attending Physician (Orthopedic Surgery) Augusto Blonder, MD as Consulting Physician (Neurosurgery) Alessandra Ancona Keven Pel, MD as Consulting Physician (Physical Medicine and Rehabilitation) Melvenia Stabs, MD as Consulting Physician (General Surgery)  Indicate any recent Medical Services you may have received from other than [facility] providers in the past year (date may be approximate).     Assessment:    This is a routine wellness examination for Gerald Carpenter.  Hearing/Vision screen Hearing Screening - Comments:: Denies hearing issues Vision Screening - Comments:: No regular eye exams   Goals Addressed             This Visit's Progress    Patient Stated       07/12/2023, want to get buttock feeling better       Depression Screen     07/12/2023    8:15 AM 12/13/2022   10:33 AM 11/12/2022    9:44 AM 08/27/2022    9:26 AM 07/24/2022   10:00 AM 05/28/2022   11:21 AM 03/23/2022   10:10 AM  PHQ 2/9 Scores  PHQ - 2 Score 0 0 0 0 0 0 0  PHQ- 9 Score 2          Fall Risk     07/12/2023    8:14 AM 01/17/2023    9:01 AM 01/01/2023    9:56 AM 12/13/2022   10:33 AM 11/12/2022    9:44 AM  Fall Risk   Falls in the past year? 0 0 0 1 0  Number falls in past yr: 0   0 0  Comment    october 2024   Injury with Fall? 0 0   0  Risk for fall due to : Impaired mobility;Impaired balance/gait   Impaired balance/gait   Follow up Falls evaluation completed;Falls prevention discussed        MEDICARE RISK AT HOME:  Medicare Risk at Home Any stairs in or around the  home?: Yes If so, are there any without handrails?: No Home free of loose throw rugs in walkways, pet beds, electrical cords, etc?: Yes Adequate lighting in your home to reduce risk of falls?: Yes Life alert?: No Use of a cane, walker or w/c?: Yes Grab bars in the bathroom?: No Shower chair or bench in shower?: Yes Elevated toilet seat or a handicapped toilet?: Yes  TIMED UP AND GO:  Was the test performed?  No  Cognitive Function: 6CIT completed        07/12/2023    8:16 AM 12/20/2021    9:19 AM  6CIT Screen  What Year? 0 points 0 points  What month? 0 points 0 points  What time? 0 points 0 points  Count back from 20 0 points 0 points  Months in reverse 0 points 0 points  Repeat phrase 4 points 0 points  Total Score 4 points 0 points    Immunizations Immunization History  Administered Date(s) Administered   PFIZER(Purple Top)SARS-COV-2 Vaccination 04/11/2019, 05/05/2019   Pneumococcal Conjugate-13 04/12/2020   Pneumococcal Polysaccharide-23 12/01/2021  Tdap 11/10/2017   Zoster, Live 03/26/2016    Screening Tests Health Maintenance  Topic Date Due   Zoster Vaccines- Shingrix (1 of 2) 01/07/1972   COVID-19 Vaccine (3 - Pfizer risk series) 06/02/2019   Medicare Annual Wellness (AWV)  07/11/2024   Colonoscopy  07/13/2024   DTaP/Tdap/Td (2 - Td or Tdap) 11/11/2027   Pneumonia Vaccine 82+ Years old  Completed   Hepatitis C Screening  Completed   HPV VACCINES  Aged Out   Meningococcal B Vaccine  Aged Out   INFLUENZA VACCINE  Discontinued    Health Maintenance  Health Maintenance Due  Topic Date Due   Zoster Vaccines- Shingrix (1 of 2) 01/07/1972   COVID-19 Vaccine (3 - Pfizer risk series) 06/02/2019   Health Maintenance Items Addressed: Due for shingles vaccine. Declines covid vaccine.  Additional Screening:  Vision Screening: Recommended annual ophthalmology exams for early detection of glaucoma and other disorders of the eye.  Dental Screening:  Recommended annual dental exams for proper oral hygiene  Community Resource Referral / Chronic Care Management: CRR required this visit?  No   CCM required this visit?  No   Plan:    I have personally reviewed and noted the following in the patient's chart:   Medical and social history Use of alcohol, tobacco or illicit drugs  Current medications and supplements including opioid prescriptions. Patient is not currently taking opioid prescriptions. Functional ability and status Nutritional status Physical activity Advanced directives List of other physicians Hospitalizations, surgeries, and ER visits in previous 12 months Vitals Screenings to include cognitive, depression, and falls Referrals and appointments  In addition, I have reviewed and discussed with patient certain preventive protocols, quality metrics, and best practice recommendations. A written personalized care plan for preventive services as well as general preventive health recommendations were provided to patient.   Areatha Beecham, LPN   7/84/6962   After Visit Summary: (MyChart) Due to this being a telephonic visit, the after visit summary with patients personalized plan was offered to patient via MyChart   Notes: Nothing significant to report at this time.

## 2023-07-12 NOTE — Patient Instructions (Addendum)
 Gerald Carpenter , Thank you for taking time out of your busy schedule to complete your Annual Wellness Visit with me. I enjoyed our conversation and look forward to speaking with you again next year. I, as well as your care team,  appreciate your ongoing commitment to your health goals. Please review the following plan we discussed and let me know if I can assist you in the future. Your Game plan/ To Do List    Referrals: If you haven't heard from the office you've been referred to, please reach out to them at the phone provided.  N/a Follow up Visits: Next Medicare AWV with our clinical staff: 07/13/2024 at 8:10   Have you seen your provider in the last 6 months (3 months if uncontrolled diabetes)? No Next Office Visit with your provider: will schedule a little bit later after orthopedist  Clinician Recommendations:  Aim for 30 minutes of exercise or brisk walking, 6-8 glasses of water, and 5 servings of fruits and vegetables each day.       This is a list of the screening recommended for you and due dates:  Health Maintenance  Topic Date Due   Zoster (Shingles) Vaccine (1 of 2) 01/07/1972   COVID-19 Vaccine (3 - Pfizer risk series) 06/02/2019   Medicare Annual Wellness Visit  07/11/2024   Colon Cancer Screening  07/13/2024   DTaP/Tdap/Td vaccine (2 - Td or Tdap) 11/11/2027   Pneumonia Vaccine  Completed   Hepatitis C Screening  Completed   HPV Vaccine  Aged Out   Meningitis B Vaccine  Aged Out   Flu Shot  Discontinued    Advanced directives: (ACP Link)Information on Advanced Care Planning can be found at [place]  Best boy Advance Health Care Directives Advance Health Care Directives. [email or web address]  Advance Care Planning is important because it:  [x]  Makes sure you receive the medical care that is consistent with your values, goals, and preferences  [x]  It provides guidance to your family and loved ones and reduces their decisional burden about whether or not they are making  the right decisions based on your wishes.  Follow the link provided in your after visit summary or read over the paperwork we have mailed to you to help you started getting your Advance Directives in place. If you need assistance in completing these, please reach out to us  so that we can help you!  See attachments for Preventive Care and Fall Prevention Tips.

## 2023-07-16 ENCOUNTER — Encounter: Payer: Self-pay | Admitting: Orthopaedic Surgery

## 2023-07-16 ENCOUNTER — Telehealth: Payer: Self-pay

## 2023-07-16 ENCOUNTER — Ambulatory Visit (INDEPENDENT_AMBULATORY_CARE_PROVIDER_SITE_OTHER): Admitting: Orthopaedic Surgery

## 2023-07-16 DIAGNOSIS — M1612 Unilateral primary osteoarthritis, left hip: Secondary | ICD-10-CM | POA: Insufficient documentation

## 2023-07-16 NOTE — Progress Notes (Signed)
 Office Visit Note   Patient: Gerald Carpenter           Date of Birth: 07/14/1950           MRN: [id number] Visit Date: 07/16/2023              Requested by: Graig Lawyer, MD [address] [place],  [place] [address] PCP: Graig Lawyer, MD   Assessment & Plan: Visit Diagnoses:  1. Primary osteoarthritis of left hip     Plan: History of Present Illness Gerald Carpenter is a 73 year old male who presents with left hip pain for follow-up after a hip injection.  He experiences a recurrence of left hip pain after an injection on Jul 02, 2023. Initially, the injection reduced his pain from a level ten to a level two within a day. The pain has returned, causing difficulty sleeping and increased discomfort with activities such as walking and mowing the lawn. Prolonged walking intensifies the pain, impacting his daily activities and quality of life.  Examination of the left hip shows severe pain with hip flexion and rotation.  Antalgic gait.  No trochanteric tenderness.  Pain with logroll and Stinchfield.  Assessment and Plan Advanced osteoarthritis left hip Chronic left hip pain relieved by cortisone injection, indicating hip origin. Pain recurrence affects daily activities.  Based on his treatment options he has elected to move forward with a left total hip arthroplasty.  Detailed surgical plan discussed. - Initiate surgical clearance with Dr. Therese Flash. - Schedule left hip replacement for mid-August, 90 days post-injection. - Will attempt outpatient surgery for later this summer.  Follow-Up Instructions: No follow-ups on file.   Orders:  No orders of the defined types were placed in this encounter.  No orders of the defined types were placed in this encounter.     Procedures: No procedures performed   Clinical Data: No additional findings.   Subjective: Chief Complaint  Patient presents with   Left Hip - Pain    HPI  Review of Systems  Constitutional:  Negative.   HENT: Negative.    Eyes: Negative.   Respiratory: Negative.    Cardiovascular: Negative.   Gastrointestinal: Negative.   Endocrine: Negative.   Genitourinary: Negative.   Skin: Negative.   Allergic/Immunologic: Negative.   Neurological: Negative.   Hematological: Negative.   Psychiatric/Behavioral: Negative.    All other systems reviewed and are negative.    Objective: Vital Signs: There were no vitals taken for this visit.  Physical Exam Vitals and nursing note reviewed.  Constitutional:      Appearance: He is well-developed.  Pulmonary:     Effort: Pulmonary effort is normal.  Abdominal:     Palpations: Abdomen is soft.  Skin:    General: Skin is warm.  Neurological:     Mental Status: He is alert and oriented to person, place, and time.  Psychiatric:        Behavior: Behavior normal.        Thought Content: Thought content normal.        Judgment: Judgment normal.     Ortho Exam  Specialty Comments:  No specialty comments available.  Imaging: No results found.   PMFS History: Patient Active Problem List   Diagnosis Date Noted   Primary osteoarthritis of left hip 07/16/2023   Status post total right knee replacement 09/04/2021   Leukocytosis 10/11/2020   PAD (peripheral artery disease) (HCC) 06/29/2020   Prediabetes 04/22/2020   Chronic kidney disease, stage  3a (HCC) 04/15/2020   Severe tobacco dependence 04/12/2020   Lower urinary tract symptoms (LUTS) 04/12/2020   History of colon polyps 04/12/2020   Atherosclerosis of native arteries of extremity with intermittent claudication (HCC) 03/01/2020   Pain in both feet 07/31/2019   Status post total replacement of right hip 05/25/2019   Lumbar radiculopathy 02/05/2019   Past Medical History:  Diagnosis Date   Alcoholism (HCC)    Arthritis    Cataract    Colon polyps    Complication of anesthesia    HLD (hyperlipidemia)    Nephritis    when pt. was 7 yrs. ols   Peripheral vascular  disease (HCC)    PONV (postoperative nausea and vomiting)    "Only when I have aortagram on 06/23/20".   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   Substance abuse (HCC)     Family History  Problem Relation Age of Onset   Pulmonary fibrosis Mother    Breast cancer Mother    Hyperlipidemia Mother    Hypertension Mother    Other Mother        Scarlet Fever   Heart disease Father    Hyperlipidemia Father    Diabetes Brother    Hypertension Brother    Hypertension Brother    Breast cancer Maternal Aunt    Prostate cancer Paternal Grandfather    Hypothyroidism Daughter    Alcohol abuse Son    Colon cancer Neg Hx    Esophageal cancer Neg Hx    Ulcerative colitis Neg Hx    Uterine cancer Neg Hx     Past Surgical History:  Procedure Laterality Date   ABDOMINAL AORTOGRAM W/LOWER EXTREMITY N/A 06/23/2020   Procedure: ABDOMINAL AORTOGRAM W/LOWER EXTREMITY;  Surgeon: Young Hensen, MD;  Location: [facility];  Service: Cardiovascular;  Laterality: N/A;   ANTERIOR LAT LUMBAR FUSION Right 02/05/2019   Procedure: ATTEMPTED ANTERIOR LATERAL INTERBODY FUSION;  Surgeon: Augusto Blonder, MD;  Location: [facility];  Service: Neurosurgery;  Laterality: Right;  RIGHT LATERAL TRANSPSOAS DISCECTOMY AND FUSION WITH ROBOTIC ASSISTED PEDICLE SCREW STABILIZATION, LUMBAR 4- LUMBAR 5   CATARACT EXTRACTION W/ INTRAOCULAR LENS  IMPLANT, BILATERAL Bilateral 2013   lens implants for cataracts after Lasik   COLONOSCOPY     ENDOVEIN HARVEST OF GREATER SAPHENOUS VEIN Left 06/29/2020   Procedure: HARVEST OF LEFT GREATER SAPHENOUS VEIN;  Surgeon: Young Hensen, MD;  Location: [facility];  Service: Vascular;  Laterality: Left;   EYE SURGERY  2013   FEMORAL-POPLITEAL BYPASS GRAFT Left 06/29/2020   Procedure: BYPASS GRAFT LEFT COMMON FEMORAL TO ABOVE KNEE POPLITEAL ARTERY;  Surgeon: Young Hensen, MD;  Location: [facility];  Service: Vascular;  Laterality: Left;   HEMORRHOID SURGERY N/A  04/06/2021   Procedure: HEMORRHOIDECTOMY 2 COLUMN;  Surgeon: Melvenia Stabs, MD;  Location: [facility];  Service: General;  Laterality: N/A;   KNEE ARTHROSCOPY Right 1980   RECTAL EXAM UNDER ANESTHESIA N/A 04/06/2021   Procedure: ANORECTAL EXAM UNDER ANESTHESIA/ excision of anal polyp;  Surgeon: Melvenia Stabs, MD;  Location: [facility];  Service: General;  Laterality: N/A;   TONSILLECTOMY     at a very young age, but grew back   TOTAL HIP ARTHROPLASTY Right 05/25/2019   Procedure: RIGHT TOTAL HIP ARTHROPLASTY ANTERIOR APPROACH;  Surgeon: Wes Hamman, MD;  Location: [facility];  Service: Orthopedics;  Laterality: Right;   TOTAL KNEE ARTHROPLASTY Right 09/04/2021   Procedure: RIGHT TOTAL KNEE ARTHROPLASTY;  Surgeon: Wes Hamman, MD;  Location[facility];  Service: Orthopedics;  Laterality: Right;   Social History   Occupational History   Occupation: retired  Tobacco Use   Smoking status: Some Days    Current packs/day: 0.25    Average packs/day: 0.3 packs/day for 53.2 years (13.3 ttl pk-yrs)    Types: Cigarettes   Smokeless tobacco: Never   Tobacco comments:    had first cigarettes age  70, habit started at age 77/today he 60 cig a day  Vaping Use   Vaping status: Never Used  Substance and Sexual Activity   Alcohol use: Not Currently    Comment: 05/10/1991   Drug use: Not Currently    Comment: prior usage of "speed"  when drinking   Sexual activity: Yes

## 2023-07-16 NOTE — Telephone Encounter (Signed)
 Faxed medical clearance to PCP, Remo Carls for left total hip arthroplasty.

## 2023-07-26 ENCOUNTER — Encounter: Payer: Self-pay | Admitting: Orthopaedic Surgery

## 2023-08-21 ENCOUNTER — Other Ambulatory Visit: Payer: Self-pay | Admitting: Orthopaedic Surgery

## 2023-08-21 MED ORDER — TRAMADOL HCL 50 MG PO TABS: 50.0000 mg | ORAL_TABLET | Freq: Every day | ORAL | 0 refills | Status: DC | PRN

## 2023-08-28 ENCOUNTER — Encounter: Payer: Self-pay | Admitting: Family Medicine

## 2023-08-28 ENCOUNTER — Ambulatory Visit: Payer: Self-pay | Admitting: Family Medicine

## 2023-08-28 ENCOUNTER — Ambulatory Visit (INDEPENDENT_AMBULATORY_CARE_PROVIDER_SITE_OTHER): Admitting: Family Medicine

## 2023-08-28 VITALS — BP 120/70 | HR 96 | Temp 97.4°F | Ht 67.0 in | Wt 165.6 lb

## 2023-08-28 DIAGNOSIS — M1612 Unilateral primary osteoarthritis, left hip: Secondary | ICD-10-CM | POA: Diagnosis not present

## 2023-08-28 DIAGNOSIS — Z01818 Encounter for other preprocedural examination: Secondary | ICD-10-CM | POA: Insufficient documentation

## 2023-08-28 DIAGNOSIS — I70212 Atherosclerosis of native arteries of extremities with intermittent claudication, left leg: Secondary | ICD-10-CM | POA: Diagnosis not present

## 2023-08-28 DIAGNOSIS — F17200 Nicotine dependence, unspecified, uncomplicated: Secondary | ICD-10-CM | POA: Diagnosis not present

## 2023-08-28 LAB — CBC
HCT: 48.4 % (ref 39.0–52.0)
Hemoglobin: 16.1 g/dL (ref 13.0–17.0)
MCHC: 33.2 g/dL (ref 30.0–36.0)
MCV: 95.7 fl (ref 78.0–100.0)
Platelets: 174 K/uL (ref 150.0–400.0)
RBC: 5.06 Mil/uL (ref 4.22–5.81)
RDW: 15.3 % (ref 11.5–15.5)
WBC: 14.8 K/uL — ABNORMAL HIGH (ref 4.0–10.5)

## 2023-08-28 LAB — LIPID PANEL
Cholesterol: 96 mg/dL (ref 0–200)
HDL: 23.8 mg/dL — ABNORMAL LOW (ref >39.00)
LDL Cholesterol: 49 mg/dL (ref 0–99)
NonHDL: 72.51
Total CHOL/HDL Ratio: 4
Triglycerides: 116 mg/dL (ref 0.0–149.0)
VLDL: 23.2 mg/dL (ref 0.0–40.0)

## 2023-08-28 LAB — COMPREHENSIVE METABOLIC PANEL WITH GFR
ALT: 30 U/L (ref 0–53)
AST: 20 U/L (ref 0–37)
Albumin: 4.2 g/dL (ref 3.5–5.2)
Alkaline Phosphatase: 54 U/L (ref 39–117)
BUN: 19 mg/dL (ref 6–23)
CO2: 27 meq/L (ref 19–32)
Calcium: 9.3 mg/dL (ref 8.4–10.5)
Chloride: 100 meq/L (ref 96–112)
Creatinine, Ser: 1.41 mg/dL (ref 0.40–1.50)
GFR: 50.44 mL/min — ABNORMAL LOW (ref >60.00)
Glucose, Bld: 132 mg/dL — ABNORMAL HIGH (ref 70–99)
Potassium: 3.9 meq/L (ref 3.5–5.1)
Sodium: 135 meq/L (ref 135–145)
Total Bilirubin: 0.6 mg/dL (ref 0.2–1.2)
Total Protein: 7.1 g/dL (ref 6.0–8.3)

## 2023-08-28 NOTE — Assessment & Plan Note (Signed)
 [facility] NSQIP Risk Calculator- 0.17% risk of MICA. NSQIP Geriatric-Sensitive Perioperative Cardiac Risk Index- 0.2% risk of MICA  I will check CMP and CBC to assure that these remain normal. 12-lead EKG is normal. Risk appears to be low for a significant cardiac event. If labs are stable, I will plan to provide surgical clearance.

## 2023-08-28 NOTE — Assessment & Plan Note (Signed)
 Plan for left total hip joint replacement.

## 2023-08-28 NOTE — Assessment & Plan Note (Signed)
 S/p left femoral artery to above knee popliteal artery bypass 06/29/2020. Claudication remains improved since surgery. Gerald Carpenter will continue aspirin  81 mg daily and atorvastatin  40 mg daily. I will check lipids today. I continue to advocate for him to stop smoking entirely.

## 2023-08-28 NOTE — Progress Notes (Signed)
 [facility] [address] [place] [address] Dept: [phone] Dept Fax: [phone]  Office Visit  Subjective:    Patient ID: Adine Clarity, male    DOB: 04/11/1950, 73 y.o..   MRN: [id number]  Chief Complaint  Patient presents with   Pre-op Exam    Pre-op clearance.   Not fasting today.    History of Present Illness:  Patient is in today for a preoperative assessment. Mr. Gram has severe osteoarthritis of the left hip. Dr. Jerri (orthopedics) plans a total left hip joint replacement under general anesthesia. Mr. Fester has had a prior right total hip and total knee joint replacements. He has been experiencing considerable pain which limits his activity and interferes with his sleep. He is using tramadol  for pain, which is only marginally effective.  Mr. Robarts has a long-standing history of smoking. He notes he was able to quit for a while, but the increased hip pain triggered him to return ot smoking. He is currently smoking about 10 cigarettes a day. 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 He had a recent follow-up with vascular surgery and continues to show good perfusion of the left lower extremity. He is managed on atorvastatin  40 mg daily.  Past Medical History: Patient Active Problem List   Diagnosis Date Noted   Primary osteoarthritis of left hip 07/16/2023   Status post total right knee replacement 09/04/2021   Leukocytosis 10/11/2020   PAD (peripheral artery disease) (HCC) 06/29/2020   Prediabetes 04/22/2020   Chronic kidney disease, stage 3a (HCC) 04/15/2020   Severe tobacco dependence 04/12/2020   Lower urinary tract symptoms (LUTS) 04/12/2020   History of colon polyps 04/12/2020   Atherosclerosis of native arteries of extremity with intermittent claudication (HCC) 03/01/2020   Pain in both feet 07/31/2019   Status post total  replacement of right hip 05/25/2019   Lumbar radiculopathy 02/05/2019   Past Surgical History:  Procedure Laterality Date   ABDOMINAL AORTOGRAM W/LOWER EXTREMITY N/A 06/23/2020   Procedure: ABDOMINAL AORTOGRAM W/LOWER EXTREMITY;  Surgeon: Gretta Lonni PARAS, MD;  [facility]: [facility];  Service: Cardiovascular;  Laterality: N/A;   ANTERIOR LAT LUMBAR FUSION Right 02/05/2019   Procedure: ATTEMPTED ANTERIOR LATERAL INTERBODY FUSION;  Surgeon: Lanis Pupa, MD;  Location: [facility];  Service: Neurosurgery;  Laterality: Right;  RIGHT LATERAL TRANSPSOAS DISCECTOMY AND FUSION WITH ROBOTIC ASSISTED PEDICLE SCREW STABILIZATION, LUMBAR 4- LUMBAR 5   CATARACT EXTRACTION W/ INTRAOCULAR LENS  IMPLANT, BILATERAL Bilateral 2013   lens implants for cataracts after Lasik   COLONOSCOPY     ENDOVEIN HARVEST OF GREATER SAPHENOUS VEIN Left 06/29/2020   Procedure: HARVEST OF LEFT GREATER SAPHENOUS VEIN;  Surgeon: Gretta Lonni PARAS, MD;  Location: [facility];  Service: Vascular;  Laterality: Left;   EYE SURGERY  2013   FEMORAL-POPLITEAL BYPASS GRAFT Left 06/29/2020   Procedure: BYPASS GRAFT LEFT COMMON FEMORAL TO ABOVE KNEE POPLITEAL ARTERY;  Surgeon: Gretta Lonni PARAS, MD;  Location: [facility];  Service: Vascular;  Laterality: Left;   HEMORRHOID SURGERY N/A 04/06/2021   Procedure: HEMORRHOIDECTOMY 2 COLUMN;  Surgeon: Teresa Lonni HERO, MD;  Location: [facility];  Service: General;  Laterality: N/A;   KNEE ARTHROSCOPY Right 1980   RECTAL EXAM UNDER ANESTHESIA N/A 04/06/2021   Procedure: ANORECTAL EXAM UNDER ANESTHESIA/ excision of anal polyp;  Surgeon: Teresa Lonni HERO, MD;  Location: [facility];  Service: General;  Laterality:  N/A;   TONSILLECTOMY     at a very young age, but grew back   TOTAL HIP ARTHROPLASTY Right 05/25/2019   Procedure: RIGHT TOTAL HIP ARTHROPLASTY ANTERIOR APPROACH;  Surgeon: Jerri Kay HERO, MD;  Location: [facility];  Service: Orthopedics;   Laterality: Right;   TOTAL KNEE ARTHROPLASTY Right 09/04/2021   Procedure: RIGHT TOTAL KNEE ARTHROPLASTY;  Surgeon: Jerri Kay HERO, MD;  Location: [facility];  Service: Orthopedics;  Laterality: Right;   Family History  Problem Relation Age of Onset   Pulmonary fibrosis Mother    Breast cancer Mother    Hyperlipidemia Mother    Hypertension Mother    Other Mother        Scarlet Fever   Heart disease Father    Hyperlipidemia Father    Diabetes Brother    Hypertension Brother    Hypertension Brother    Breast cancer Maternal Aunt    Prostate cancer Paternal Grandfather    Hypothyroidism Daughter    Alcohol abuse Son    Colon cancer Neg Hx    Esophageal cancer Neg Hx    Ulcerative colitis Neg Hx    Uterine cancer Neg Hx    Outpatient Medications Prior to Visit  Medication Sig Dispense Refill   aspirin  EC 81 MG tablet Take 81 mg by mouth daily. Swallow whole.     atorvastatin  (LIPITOR) 40 MG tablet TAKE 1 TABLET(40 MG) BY MOUTH DAILY 90 tablet 3   tamsulosin  (FLOMAX ) 0.4 MG CAPS capsule TAKE 1 CAPSULE(0.4 MG) BY MOUTH IN THE MORNING 90 capsule 3   traMADol  (ULTRAM ) 50 MG tablet Take 1-2 tablets (50-100 mg total) by mouth daily as needed. 20 tablet 0   Wheat Dextrin-Vit B6-B12-FA (BENEFIBER PLUS HEART HEALTH PO) Take 2 Scoops by mouth daily.     gabapentin  (NEURONTIN ) 300 MG capsule TAKE 1 CAPSULE BY MOUTH EVERY NIGHT AT BEDTIME FOR 30 DAYS, THEN 1 CAPSULE TWICE DAILY X30 DAYS, THEN 1 CAPSULE THREE TIMES DAILY (Patient not taking: Reported on 07/12/2023) 180 capsule 0   methocarbamol  (ROBAXIN -750) 750 MG tablet Take 1 tablet (750 mg total) by mouth 2 (two) times daily as needed for muscle spasms. (Patient not taking: Reported on 07/12/2023) 20 tablet 2   nicotine  (NICODERM CQ  - DOSED IN MG/24 HOURS) 21 mg/24hr patch Place 1 patch (21 mg total) onto the skin daily. (Patient not taking: Reported on 07/12/2023) 28 patch 3   topiramate  (TOPAMAX ) 25 MG tablet Take 1 tablet (25 mg total) by mouth  daily. (Patient not taking: Reported on 07/12/2023) 30 tablet 3   No facility-administered medications prior to visit.   Allergies  Allergen Reactions   Anesthetics, Amide Nausea And Vomiting     Objective:   Today's Vitals   08/28/23 0759  BP: 120/70  Pulse: 96  Temp: (!) 97.4 F (36.3 C)  TempSrc: Temporal  SpO2: 99%  Weight: 165 lb 9.6 oz (75.1 kg)  Height: 5' 7 (1.702 m)   Body mass index is 25.94 kg/m.   General: Well developed, well nourished. No acute distress. Lungs: Clear to auscultation bilaterally. No wheezing, rales or rhonchi. CV: RRR without murmurs or rubs. Pulses 2+ bilaterally. Psych: Alert and oriented. Normal mood and affect.  Health Maintenance Due  Topic Date Due   Zoster Vaccines- Shingrix (1 of 2) 01/07/1972     EKG: Normal sinus rhythm (rate= 88)  Assessment & Plan:   Problem List Items Addressed This Visit       Cardiovascular and Mediastinum  Atherosclerosis of native arteries of extremity with intermittent claudication (HCC)   S/p left femoral artery to above knee popliteal artery bypass 06/29/2020. Claudication remains improved since surgery. Mr. Matheson will continue aspirin  81 mg daily and atorvastatin  40 mg daily. I will check lipids today. I continue to advocate for him to stop smoking entirely.      Relevant Orders   Lipid panel     Musculoskeletal and Integument   Primary osteoarthritis of left hip   Plan for left total hip joint replacement.        Other   Preoperative examination - Primary   [facility]pta NSQIP Risk Calculator- 0.17% risk of MICA. NSQIP Geriatric-Sensitive Perioperative Cardiac Risk Index- 0.2% risk of MICA  I will check CMP and CBC to assure that these remain normal. 12-lead EKG is normal. Risk appears to be low for a significant cardiac event. If labs are stable, I will plan to provide surgical clearance.      Relevant Orders   EKG 12-Lead (Completed)   Comprehensive metabolic panel with GFR   CBC   Lipid  panel   Severe tobacco dependence   Continue to advocate for abstinence of tobacco use.        Return in about 3 months (around 11/28/2023) for Reassessment.   Garnette CHRISTELLA Simpler, MD

## 2023-08-28 NOTE — Assessment & Plan Note (Signed)
 Continue to advocate for abstinence of tobacco use.

## 2023-08-30 ENCOUNTER — Encounter: Payer: Self-pay | Admitting: Orthopaedic Surgery

## 2023-08-30 ENCOUNTER — Other Ambulatory Visit: Payer: Self-pay | Admitting: Orthopaedic Surgery

## 2023-08-30 MED ORDER — ACETAMINOPHEN-CODEINE 300-30 MG PO TABS: 1.0000 | ORAL_TABLET | Freq: Every day | ORAL | 0 refills | Status: AC | PRN

## 2023-09-17 NOTE — Telephone Encounter (Signed)
See message about scheduling.  Thanks.

## 2023-09-19 NOTE — Telephone Encounter (Signed)
 Thank you :)

## 2023-09-25 ENCOUNTER — Other Ambulatory Visit: Payer: Self-pay | Admitting: Family Medicine

## 2023-09-25 DIAGNOSIS — I739 Peripheral vascular disease, unspecified: Secondary | ICD-10-CM

## 2023-10-08 ENCOUNTER — Encounter: Payer: Self-pay | Admitting: Family Medicine

## 2023-10-10 ENCOUNTER — Ambulatory Visit (INDEPENDENT_AMBULATORY_CARE_PROVIDER_SITE_OTHER): Admitting: Family Medicine

## 2023-10-10 ENCOUNTER — Encounter: Payer: Self-pay | Admitting: Family Medicine

## 2023-10-10 VITALS — BP 130/72 | HR 86 | Temp 97.6°F | Ht 67.0 in | Wt 168.2 lb

## 2023-10-10 DIAGNOSIS — H6121 Impacted cerumen, right ear: Secondary | ICD-10-CM | POA: Diagnosis not present

## 2023-10-10 NOTE — Patient Instructions (Signed)
Ear Irrigation Ear irrigation is a procedure to wash dirt and wax out of your ear canal. It's also called lavage. You may need this if you're having trouble hearing because of wax in your ear. You may also have it done as part of the treatment for an ear infection.  Getting wax and dirt out of your ear can help ear drops work better. Tell a health care provider about: Any allergies you have. All medicines you're taking, including vitamins, herbs, eye drops, creams, and over-the-counter medicines. Any problems you or family members have had with anesthesia. Any bleeding problems you have. Any surgeries you've had. This includes any ear surgeries. Any medical conditions you have, such as any problems with your ear. Whether you're pregnant or may be pregnant. What are the risks? Your health care provider will talk with you about risks. These may include: Infection. Pain. Loss of hearing. Fluid and debris being pushed into your middle ear. This can happen if there are holes in your eardrum. The procedure not working. Trauma to your ear. Feeling dizzy, light-headed, or nauseous. What happens before the procedure? You'll talk with your provider about the procedure and plan. You may be given ear drops to put in your ear 15-20 minutes before the procedure. This helps loosen the wax. What happens during the procedure?  A syringe will be filled with water or a saline solution. Saline is made of salt and water. The syringe will be gently put into your ear. The fluid will be used to wash out wax and other debris. The procedure may vary among providers and hospitals. What can I expect after the procedure? Follow the instructions given to you by your provider. Follow these instructions at home: Using ear irrigation kits In some cases, you can use an ear irrigation kit at home. Ask your provider if this is an option for you. Use the kit only as told by your provider. Read the instructions on the  package. Follow the directions for using the syringe. Use water that's at room temperature. Do not use an ear irrigation kit if: You have diabetes. This can make you more likely to get an infection. You have a hole or tear in your eardrum. You have tubes in your ears. You've had ear surgery before. You've been told not to irrigate your ears. Cleaning your ears  Clean the outside of your ear with a soft washcloth each day. If told by your provider, use a few drops of baby oil, mineral oil, glycerin, hydrogen peroxide, or earwax softening drops. Do not use cotton swabs to clean your ears. These can push wax down into the ear canal. Do not put things into your ears to try to get rid of wax. This includes ear candles. General instructions Take over-the-counter and prescription medicines only as told by your provider. If you were prescribed antibiotics, use them as told by your provider. Do not stop using the antibiotic even if you start to feel better. Keep your ear clean and dry as told by your provider. See your provider at least once a year to have your ears and hearing checked. Contact a health care provider if: Your hearing isn't getting better. Your hearing is getting worse. You have pain or redness in your ear. You feel dizzy. You have ringing in your ears. You have nausea or vomiting. This information is not intended to replace advice given to you by your health care provider. Make sure you discuss any questions you have with  your health care provider. Document Revised: 04/19/2022 Document Reviewed: 04/19/2022 Elsevier Patient Education  2024 [employer].

## 2023-10-10 NOTE — Progress Notes (Signed)
 [facility] [address] [place] [address] Dept: [phone] Dept Fax: [phone]  Office Visit  Subjective:    Patient ID: Gerald Carpenter, male    DOB: 11-27-1950, 73 y.o..   MRN: [id number]  Chief Complaint  Patient presents with   Ear Fullness    C/o having wax in ears and unable to hear out of RT ear.    History of Present Illness:  Patient is in today for concern about impacted ear wax in his right ear canal. Gerald Carpenter notes this has been a recurrent issue for him over the years. He does try to flush his ear with peroxide at home.  Past Medical History: Patient Active Problem List   Diagnosis Date Noted   Preoperative examination 08/28/2023   Primary osteoarthritis of left hip 07/16/2023   Status post total right knee replacement 09/04/2021   Leukocytosis 10/11/2020   PAD (peripheral artery disease) (HCC) 06/29/2020   Prediabetes 04/22/2020   Chronic kidney disease, stage 3a (HCC) 04/15/2020   Severe tobacco dependence 04/12/2020   Lower urinary tract symptoms (LUTS) 04/12/2020   History of colon polyps 04/12/2020   Atherosclerosis of native arteries of extremity with intermittent claudication (HCC) 03/01/2020   Pain in both feet 07/31/2019   Status post total replacement of right hip 05/25/2019   Lumbar radiculopathy 02/05/2019   Past Surgical History:  Procedure Laterality Date   ABDOMINAL AORTOGRAM W/LOWER EXTREMITY N/A 06/23/2020   Procedure: ABDOMINAL AORTOGRAM W/LOWER EXTREMITY;  Surgeon: Gretta Lonni PARAS, MD;  Location: [facility];  Service: Cardiovascular;  Laterality: N/A;   ANTERIOR LAT LUMBAR FUSION Right 02/05/2019   Procedure: ATTEMPTED ANTERIOR LATERAL INTERBODY FUSION;  Surgeon: Lanis Pupa, MD;  Location: [facility] OR;  Service: Neurosurgery;  Laterality: Right;  RIGHT LATERAL TRANSPSOAS DISCECTOMY AND FUSION WITH ROBOTIC ASSISTED PEDICLE SCREW STABILIZATION, LUMBAR 4- LUMBAR  5   CATARACT EXTRACTION W/ INTRAOCULAR LENS  IMPLANT, BILATERAL Bilateral 2013   lens implants for cataracts after Lasik   COLONOSCOPY     ENDOVEIN HARVEST OF GREATER SAPHENOUS VEIN Left 06/29/2020   Procedure: HARVEST OF LEFT GREATER SAPHENOUS VEIN;  Surgeon: Gretta Lonni PARAS, MD;  Location: [facility] OR;  Service: Vascular;  Laterality: Left;   EYE SURGERY  2013   FEMORAL-POPLITEAL BYPASS GRAFT Left 06/29/2020   Procedure: BYPASS GRAFT LEFT COMMON FEMORAL TO ABOVE KNEE POPLITEAL ARTERY;  Surgeon: Gretta Lonni PARAS, MD;  Location: [facility] OR;  Service: Vascular;  Laterality: Left;   HEMORRHOID SURGERY N/A 04/06/2021   Procedure: HEMORRHOIDECTOMY 2 COLUMN;  Surgeon: Teresa Lonni HERO, MD;  Location: [facility];  Service: General;  Laterality: N/A;   KNEE ARTHROSCOPY Right 1980   RECTAL EXAM UNDER ANESTHESIA N/A 04/06/2021   Procedure: ANORECTAL EXAM UNDER ANESTHESIA/ excision of anal polyp;  Surgeon: Teresa Lonni HERO, MD;  Location: [facility];  Service: General;  Laterality: N/A;   TONSILLECTOMY     at a very young age, but grew back   TOTAL HIP ARTHROPLASTY Right 05/25/2019   Procedure: RIGHT TOTAL HIP ARTHROPLASTY ANTERIOR APPROACH;  Surgeon: Jerri Kay HERO, MD;  Location: [facility] OR;  Service: Orthopedics;  Laterality: Right;   TOTAL KNEE ARTHROPLASTY Right 09/04/2021   Procedure: RIGHT TOTAL KNEE ARTHROPLASTY;  Surgeon: Jerri Kay HERO, MD;  Location: [facility];  Service: Orthopedics;  Laterality: Right;   Family History  Problem Relation Age of Onset   Pulmonary fibrosis Mother    Breast cancer Mother  Hyperlipidemia Mother    Hypertension Mother    Other Mother        Scarlet Fever   Heart disease Father    Hyperlipidemia Father    Diabetes Brother    Hypertension Brother    Hypertension Brother    Breast cancer Maternal Aunt    Prostate cancer Paternal Grandfather    Hypothyroidism Daughter    Alcohol abuse Son    Colon cancer Neg Hx     Esophageal cancer Neg Hx    Ulcerative colitis Neg Hx    Uterine cancer Neg Hx    Outpatient Medications Prior to Visit  Medication Sig Dispense Refill   aspirin  EC 81 MG tablet Take 81 mg by mouth daily. Swallow whole.     atorvastatin  (LIPITOR) 40 MG tablet TAKE 1 TABLET(40 MG) BY MOUTH DAILY 90 tablet 3   tamsulosin  (FLOMAX ) 0.4 MG CAPS capsule TAKE 1 CAPSULE(0.4 MG) BY MOUTH IN THE MORNING 90 capsule 3   traMADol  (ULTRAM ) 50 MG tablet Take 1-2 tablets (50-100 mg total) by mouth daily as needed. 20 tablet 0   Wheat Dextrin-Vit B6-B12-FA (BENEFIBER PLUS HEART HEALTH PO) Take 2 Scoops by mouth daily.     No facility-administered medications prior to visit.   Allergies  Allergen Reactions   Anesthetics, Amide Nausea And Vomiting     Objective:   Today's Vitals   10/10/23 0909  BP: 130/72  Pulse: 86  Temp: 97.6 F (36.4 C)  TempSrc: Temporal  SpO2: 97%  Weight: 168 lb 3.2 oz (76.3 kg)  Height: 5' 7 (1.702 m)   Body mass index is 26.34 kg/m.   General: Well developed, well nourished. No acute distress. HEENT: Normocephalic, non-traumatic. External ears normal. Left EAC and TM normal. Right EAC is occluded with wax.  Psych: Alert and oriented. Normal mood and affect.  Health Maintenance Due  Topic Date Due   Zoster Vaccines- Shingrix (1 of 2) 01/07/1972   PROCEDURE- Ear Wax Removal Indication: Impacted ear wax  PARQ reviewed with patient. Verbal consent obtained. Flushed ear with mixture of warm water and hydrogen peroxide. Lighted curette used to remove wax. Patient tolerated procedure with some pain and minor bleeding.    Assessment & Plan:   Problem List Items Addressed This Visit   None Visit Diagnoses       Impacted cerumen of right ear    -  Primary   Removed 90% of wax in right ear. Limited by discomfort with use of curette. Reviewed home flushing approaches.       Return for Follow-up as scheduled.   Garnette CHRISTELLA Simpler, MD

## 2023-11-06 ENCOUNTER — Other Ambulatory Visit: Payer: Self-pay | Admitting: Physician Assistant

## 2023-11-06 MED ORDER — METHOCARBAMOL 750 MG PO TABS: 750.0000 mg | ORAL_TABLET | Freq: Three times a day (TID) | ORAL | 2 refills | Status: DC | PRN

## 2023-11-06 MED ORDER — ONDANSETRON HCL 4 MG PO TABS: 4.0000 mg | ORAL_TABLET | Freq: Three times a day (TID) | ORAL | 0 refills | Status: DC | PRN

## 2023-11-06 MED ORDER — DOCUSATE SODIUM 100 MG PO CAPS: 100.0000 mg | ORAL_CAPSULE | Freq: Every day | ORAL | 2 refills | Status: DC | PRN

## 2023-11-06 MED ORDER — ASPIRIN 81 MG PO CHEW: 81.0000 mg | CHEWABLE_TABLET | Freq: Two times a day (BID) | ORAL | 0 refills | Status: AC

## 2023-11-06 MED ORDER — OXYCODONE-ACETAMINOPHEN 5-325 MG PO TABS: 1.0000 | ORAL_TABLET | Freq: Four times a day (QID) | ORAL | 0 refills | Status: DC | PRN

## 2023-11-12 NOTE — Pre-Procedure Instructions (Signed)
 Surgical Instructions   Your procedure is scheduled on November 18, 2023. Report to [facility] Main Entrance A at 6:00 A.M., then check in with the Admitting office. Any questions or running late day of surgery: call [phone]  Questions prior to your surgery date: call [phone], Monday-Friday, 8am-4pm. If you experience any cold or flu symptoms such as cough, fever, chills, shortness of breath, etc. between now and your scheduled surgery, please notify us  at the above number.     Remember:  Do not eat after midnight the night before your surgery  You may drink clear liquids until 5:30 AM the morning of your surgery.   Clear liquids allowed are: Water, Non-Citrus Juices (without pulp), Carbonated Beverages, Clear Tea (no milk, honey, etc.), Black Coffee Only (NO MILK, CREAM OR POWDERED CREAMER of any kind), and Gatorade.  Patient Instructions  The night before surgery:  No food after midnight. ONLY clear liquids after midnight  The day of surgery (if you do NOT have diabetes):  Drink ONE (1) Pre-Surgery Clear Ensure by 5:30 AM the morning of surgery. Drink in one sitting. Do not sip.  This drink was given to you during your hospital  pre-op appointment visit.  Nothing else to drink after completing the  Pre-Surgery Clear Ensure.         If you have questions, please contact your surgeon's office.    Take these medicines the morning of surgery with A SIP OF WATER: atorvastatin  (LIPITOR)  tamsulosin  (FLOMAX )    One week prior to surgery, STOP taking any Aspirin  (unless otherwise instructed by your surgeon) Aleve, Naproxen, Ibuprofen, Motrin, Advil, Goody's, BC's, all herbal medications, fish oil, and non-prescription vitamins.                     Do NOT Smoke (Tobacco/Vaping) for 24 hours prior to your procedure.  If you use a CPAP at night, you may bring your mask/headgear for your overnight stay.   You will be asked to remove any contacts, glasses, piercing's,  hearing aid's, dentures/partials prior to surgery. Please bring cases for these items if needed.    Patients discharged the day of surgery will not be allowed to drive home, and someone needs to stay with them for 24 hours.  SURGICAL WAITING ROOM VISITATION Patients may have no more than 2 support people in the waiting area - these visitors may rotate.   Pre-op nurse will coordinate an appropriate time for 1 ADULT support person, who may not rotate, to accompany patient in pre-op.  Children under the age of 49 must have an adult with them who is not the patient and must remain in the main waiting area with an adult.  If the patient needs to stay at the hospital during part of their recovery, the visitor guidelines for inpatient rooms apply.  Please refer to the [facility] website for the visitor guidelines for any additional information.   If you received a COVID test during your pre-op visit  it is requested that you wear a mask when out in public, stay away from anyone that may not be feeling well and notify your surgeon if you develop symptoms. If you have been in contact with anyone that has tested positive in the last 10 days please notify you surgeon.      Pre-operative 5 CHG Bathing Instructions   You can play a key role in reducing the risk of infection after surgery. Your skin needs to be as free  of germs as possible. You can reduce the number of germs on your skin by washing with CHG (chlorhexidine  gluconate) soap before surgery. CHG is an antiseptic soap that kills germs and continues to kill germs even after washing.   DO NOT use if you have an allergy to chlorhexidine /CHG or antibacterial soaps. If your skin becomes reddened or irritated, stop using the CHG and notify one of our RNs at [phone].   Please shower with the CHG soap starting 4 days before surgery using the following schedule:     Please keep in mind the following:  DO NOT shave, including legs and  underarms, starting the day of your first shower.   You may shave your face at any point before/day of surgery.  Place clean sheets on your bed the day you start using CHG soap. Use a clean washcloth (not used since being washed) for each shower. DO NOT sleep with pets once you start using the CHG.   CHG Shower Instructions:  Wash your face and private area with normal soap. If you choose to wash your hair, wash first with your normal shampoo.  After you use shampoo/soap, rinse your hair and body thoroughly to remove shampoo/soap residue.  Turn the water OFF and apply about 3 tablespoons (45 ml) of CHG soap to a CLEAN washcloth.  Apply CHG soap ONLY FROM YOUR NECK DOWN TO YOUR TOES (washing for 3-5 minutes)  DO NOT use CHG soap on face, private areas, open wounds, or sores.  Pay special attention to the area where your surgery is being performed.  If you are having back surgery, having someone wash your back for you may be helpful. Wait 2 minutes after CHG soap is applied, then you may rinse off the CHG soap.  Pat dry with a clean towel  Put on clean clothes/pajamas   If you choose to wear lotion, please use ONLY the CHG-compatible lotions that are listed below.  Additional instructions for the day of surgery: DO NOT APPLY any lotions, deodorants, cologne, or perfumes.   Do not bring valuables to the hospital. [facility] is not responsible for any belongings/valuables. Do not wear nail polish, gel polish, artificial nails, or any other type of covering on natural nails (fingers and toes) Do not wear jewelry or makeup Put on clean/comfortable clothes.  Please brush your teeth.  Ask your nurse before applying any prescription medications to the skin.     CHG Compatible Lotions   Aveeno Moisturizing lotion  Cetaphil Moisturizing Cream  Cetaphil Moisturizing Lotion  Clairol Herbal Essence Moisturizing Lotion, Dry Skin  Clairol Herbal Essence Moisturizing Lotion, Extra Dry Skin   Clairol Herbal Essence Moisturizing Lotion, Normal Skin  Curel Age Defying Therapeutic Moisturizing Lotion with Alpha Hydroxy  Curel Extreme Care Body Lotion  Curel Soothing Hands Moisturizing Hand Lotion  Curel Therapeutic Moisturizing Cream, Fragrance-Free  Curel Therapeutic Moisturizing Lotion, Fragrance-Free  Curel Therapeutic Moisturizing Lotion, Original Formula  Eucerin Daily Replenishing Lotion  Eucerin Dry Skin Therapy Plus Alpha Hydroxy Crme  Eucerin Dry Skin Therapy Plus Alpha Hydroxy Lotion  Eucerin Original Crme  Eucerin Original Lotion  Eucerin Plus Crme Eucerin Plus Lotion  Eucerin TriLipid Replenishing Lotion  Keri Anti-Bacterial Hand Lotion  Keri Deep Conditioning Original Lotion Dry Skin Formula Softly Scented  Keri Deep Conditioning Original Lotion, Fragrance Free Sensitive Skin Formula  Keri Lotion Fast Absorbing Fragrance Free Sensitive Skin Formula  Keri Lotion Fast Absorbing Softly Scented Dry Skin Formula  Keri Original Lotion  [employer]  Skin Renewal Lotion Keri Silky Smooth Lotion  Keri Silky Smooth Sensitive Skin Lotion  Nivea Body Creamy Conditioning Oil  Nivea Body Extra Enriched Lotion  Nivea Body Original Lotion  Nivea Body Sheer Moisturizing Lotion Nivea Crme  Nivea Skin Firming Lotion  NutraDerm 30 Skin Lotion  NutraDerm Skin Lotion  NutraDerm Therapeutic Skin Cream  NutraDerm Therapeutic Skin Lotion  ProShield Protective Hand Cream  Provon moisturizing lotion  Please read over the following fact sheets that you were given.

## 2023-11-13 ENCOUNTER — Encounter (HOSPITAL_COMMUNITY)
Admission: RE | Admit: 2023-11-13 | Discharge: 2023-11-13 | Disposition: A | Discharge status: Home / Self Care | Source: Ambulatory Visit | Attending: Orthopaedic Surgery | Admitting: Orthopaedic Surgery

## 2023-11-13 ENCOUNTER — Other Ambulatory Visit: Payer: Self-pay

## 2023-11-13 ENCOUNTER — Encounter (HOSPITAL_COMMUNITY): Payer: Self-pay

## 2023-11-13 VITALS — BP 130/71 | HR 93 | Temp 97.7°F | Resp 18 | Ht 67.0 in | Wt 166.9 lb

## 2023-11-13 DIAGNOSIS — M1612 Unilateral primary osteoarthritis, left hip: Secondary | ICD-10-CM | POA: Diagnosis not present

## 2023-11-13 DIAGNOSIS — Z01812 Encounter for preprocedural laboratory examination: Secondary | ICD-10-CM | POA: Insufficient documentation

## 2023-11-13 DIAGNOSIS — Z01818 Encounter for other preprocedural examination: Secondary | ICD-10-CM

## 2023-11-13 LAB — CBC
HCT: 49.7 % (ref 39.0–52.0)
Hemoglobin: 16.1 g/dL (ref 13.0–17.0)
MCH: 31.8 pg (ref 26.0–34.0)
MCHC: 32.4 g/dL (ref 30.0–36.0)
MCV: 98.2 fL (ref 80.0–100.0)
Platelets: 181 K/uL (ref 150–400)
RBC: 5.06 MIL/uL (ref 4.22–5.81)
RDW: 13.9 % (ref 11.5–15.5)
WBC: 14.5 K/uL — ABNORMAL HIGH (ref 4.0–10.5)
nRBC: 0 % (ref 0.0–0.2)

## 2023-11-13 LAB — BASIC METABOLIC PANEL WITH GFR
Anion gap: 10 (ref 5–15)
BUN: 11 mg/dL (ref 8–23)
CO2: 23 mmol/L (ref 22–32)
Calcium: 8.8 mg/dL — ABNORMAL LOW (ref 8.9–10.3)
Chloride: 106 mmol/L (ref 98–111)
Creatinine, Ser: 1.3 mg/dL — ABNORMAL HIGH (ref 0.61–1.24)
GFR, Estimated: 59 mL/min — ABNORMAL LOW (ref >60)
Glucose, Bld: 111 mg/dL — ABNORMAL HIGH (ref 70–99)
Potassium: 4.1 mmol/L (ref 3.5–5.1)
Sodium: 139 mmol/L (ref 135–145)

## 2023-11-13 LAB — TYPE AND SCREEN
ABO/RH(D): A POS
Antibody Screen: NEGATIVE

## 2023-11-13 LAB — SURGICAL PCR SCREEN
MRSA, PCR: NEGATIVE
Staphylococcus aureus: NEGATIVE

## 2023-11-13 NOTE — Progress Notes (Signed)
 Surgical Instructions     Your procedure is scheduled on November 18, 2023. Report to [facility] Main Entrance A at 6:00 A.M., then check in with the Admitting office. Any questions or running late day of surgery: call [phone]   Questions prior to your surgery date: call [phone], Monday-Friday, 8am-4pm. If you experience any cold or flu symptoms such as cough, fever, chills, shortness of breath, etc. between now and your scheduled surgery, please notify us  at the above number.            Remember:       Do not eat after midnight the night before your surgery   You may drink clear liquids until 5:30 AM the morning of your surgery.   Clear liquids allowed are: Water, Non-Citrus Juices (without pulp), Carbonated Beverages, Clear Tea (no milk, honey, etc.), Black Coffee Only (NO MILK, CREAM OR POWDERED CREAMER of any kind), and Gatorade.   Patient Instructions   The night before surgery:  No food after midnight. ONLY clear liquids after midnight   The day of surgery (if you do NOT have diabetes):  Drink ONE (1) Pre-Surgery Clear Ensure by 5:30 AM the morning of surgery. Drink in one sitting. Do not sip.  This drink was given to you during your hospital pre-op appointment visit.   Nothing else to drink after completing the Pre-Surgery Clear Ensure.          If you have questions, please contact your surgeon's office.          Take these medicines the morning of surgery with A SIP OF WATER: atorvastatin  (LIPITOR)  tamsulosin  (FLOMAX )   Follow your surgeon's instructions on when to stop Asprin.  If no instructions were given by your surgeon then you will need to call the office to get those instructions.       One week prior to surgery, STOP taking any Aleve, Naproxen, Ibuprofen, Motrin, Advil, Goody's, BC's, all herbal medications, fish oil, and non-prescription vitamins.                     Do NOT Smoke (Tobacco/Vaping) for 24 hours prior to your procedure.   If you  use a CPAP at night, you may bring your mask/headgear for your overnight stay.   You will be asked to remove any contacts, glasses, piercing's, hearing aid's, dentures/partials prior to surgery. Please bring cases for these items if needed.    Patients discharged the day of surgery will not be allowed to drive home, and someone needs to stay with them for 24 hours.   SURGICAL WAITING ROOM VISITATION Patients may have no more than 2 support people in the waiting area - these visitors may rotate.   Pre-op nurse will coordinate an appropriate time for 1 ADULT support person, who may not rotate, to accompany patient in pre-op.  Children under the age of 49 must have an adult with them who is not the patient and must remain in the main waiting area with an adult.   If the patient needs to stay at the hospital during part of their recovery, the visitor guidelines for inpatient rooms apply.   Please refer to the [facility] website for the visitor guidelines for any additional information.     If you received a COVID test during your pre-op visit  it is requested that you wear a mask when out in public, stay away from anyone that may not be feeling well and notify your  surgeon if you develop symptoms. If you have been in contact with anyone that has tested positive in the last 10 days please notify you surgeon.         Pre-operative 5 CHG Bathing Instructions    You can play a key role in reducing the risk of infection after surgery. Your skin needs to be as free of germs as possible. You can reduce the number of germs on your skin by washing with CHG (chlorhexidine  gluconate) soap before surgery. CHG is an antiseptic soap that kills germs and continues to kill germs even after washing.    DO NOT use if you have an allergy to chlorhexidine /CHG or antibacterial soaps. If your skin becomes reddened or irritated, stop using the CHG and notify one of our RNs at [phone].    Please shower with the  CHG soap starting 4 days before surgery using the following schedule:       Please keep in mind the following:  DO NOT shave, including legs and underarms, starting the day of your first shower.   You may shave your face at any point before/day of surgery.  Place clean sheets on your bed the day you start using CHG soap. Use a clean washcloth (not used since being washed) for each shower. DO NOT sleep with pets once you start using the CHG.    CHG Shower Instructions:  Wash your face and private area with normal soap. If you choose to wash your hair, wash first with your normal shampoo.  After you use shampoo/soap, rinse your hair and body thoroughly to remove shampoo/soap residue.  Turn the water OFF and apply about 3 tablespoons (45 ml) of CHG soap to a CLEAN washcloth.  Apply CHG soap ONLY FROM YOUR NECK DOWN TO YOUR TOES (washing for 3-5 minutes)  DO NOT use CHG soap on face, private areas, open wounds, or sores.  Pay special attention to the area where your surgery is being performed.  If you are having back surgery, having someone wash your back for you may be helpful. Wait 2 minutes after CHG soap is applied, then you may rinse off the CHG soap.  Pat dry with a clean towel  Put on clean clothes/pajamas   If you choose to wear lotion, please use ONLY the CHG-compatible lotions that are listed below.   Additional instructions for the day of surgery: DO NOT APPLY any lotions, deodorants, cologne, or perfumes.   Do not bring valuables to the hospital. [facility] is not responsible for any belongings/valuables. Do not wear nail polish, gel polish, artificial nails, or any other type of covering on natural nails (fingers and toes) Do not wear jewelry or makeup Put on clean/comfortable clothes.  Please brush your teeth.  Ask your nurse before applying any prescription medications to the skin.        CHG Compatible Lotions    Aveeno Moisturizing lotion  Cetaphil Moisturizing  Cream  Cetaphil Moisturizing Lotion  Clairol Herbal Essence Moisturizing Lotion, Dry Skin  Clairol Herbal Essence Moisturizing Lotion, Extra Dry Skin  Clairol Herbal Essence Moisturizing Lotion, Normal Skin  Curel Age Defying Therapeutic Moisturizing Lotion with Alpha Hydroxy  Curel Extreme Care Body Lotion  Curel Soothing Hands Moisturizing Hand Lotion  Curel Therapeutic Moisturizing Cream, Fragrance-Free  Curel Therapeutic Moisturizing Lotion, Fragrance-Free  Curel Therapeutic Moisturizing Lotion, Original Formula  Eucerin Daily Replenishing Lotion  Eucerin Dry Skin Therapy Plus Alpha Hydroxy Crme  Eucerin Dry Skin Therapy Plus Alpha Hydroxy Lotion  Eucerin Original Crme  Eucerin Original Lotion  Eucerin Plus Crme Eucerin Plus Lotion  Eucerin TriLipid Replenishing Lotion  Keri Anti-Bacterial Hand Lotion  Keri Deep Conditioning Original Lotion Dry Skin Formula Softly Scented  Keri Deep Conditioning Original Lotion, Fragrance Free Sensitive Skin Formula  Keri Lotion Fast Absorbing Fragrance Free Sensitive Skin Formula  Keri Lotion Fast Absorbing Softly Scented Dry Skin Formula  Keri Original Lotion  Keri Skin Renewal Lotion Keri Silky Smooth Lotion  Keri Silky Smooth Sensitive Skin Lotion  Nivea Body Creamy Conditioning Oil  Nivea Body Extra Enriched Lotion  Nivea Body Original Lotion  Nivea Body Sheer Moisturizing Lotion Nivea Crme  Nivea Skin Firming Lotion  NutraDerm 30 Skin Lotion  NutraDerm Skin Lotion  NutraDerm Therapeutic Skin Cream  NutraDerm Therapeutic Skin Lotion  ProShield Protective Hand Cream  Provon moisturizing lotion   Please read over the following fact sheets that you were given.

## 2023-11-13 NOTE — Progress Notes (Signed)
 PCP - Dr. Garnette Simpler Cardiologist - Denies  PPM/ICD - Denies Device Orders - n/a Rep Notified - n/a  Chest x-ray - N/A EKG - 08-28-23 Stress Test - Denies ECHO - Denies Cardiac Cath - Denies  Sleep Study - Denies CPAP - n/a  NON-diabetic  Last dose of GLP1 agonist-  Denies GLP1 instructions: n/a  Blood Thinner Instructions: Denies Aspirin  Instructions: Per patient his last dose was taken on 11-12-23  ERAS Protcol - Clears until 0530 PRE-SURGERY Ensure or G2- Ensure  COVID TEST- n/a   Anesthesia review: No  Patient denies shortness of breath, fever, cough and chest pain at PAT appointment. Patient denies any respiratory issues at this time.    All instructions explained to the patient, with a verbal understanding of the material. Patient agrees to go over the instructions while at home for a better understanding. Patient also instructed to self quarantine after being tested for COVID-19. The opportunity to ask questions was provided.

## 2023-11-15 MED ORDER — TRANEXAMIC ACID 1000 MG/10ML IV SOLN
2000.0000 mg | INTRAVENOUS | Status: DC
  Filled 2023-11-15: qty 20

## 2023-11-17 ENCOUNTER — Telehealth: Payer: Self-pay | Admitting: *Deleted

## 2023-11-17 NOTE — Telephone Encounter (Signed)
 [facility] pre-op call completed for upcoming Left total hip arthroplasty.

## 2023-11-17 NOTE — Care Plan (Signed)
 [facility] RNCM call to patient to discuss his upcoming Left total hip arthroplasty with Dr. Jerri on 11/18/23 at [facility]. He is agreeable to case management. Patient lives with his spouse and will have assistance at home from family after discharge. He anticipates SDDC if possible. 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 Anticipate HHPT will be needed after a short hospital stay. Referral made to [facility] after choice provided. Reviewed post op care instructions. Will continue to follow for needs.

## 2023-11-18 ENCOUNTER — Encounter (HOSPITAL_COMMUNITY)
Admission: RE | Disposition: A | Discharge status: Home / Self Care | Payer: Self-pay | Source: Home / Self Care | Attending: Orthopaedic Surgery

## 2023-11-18 ENCOUNTER — Other Ambulatory Visit: Payer: Self-pay

## 2023-11-18 ENCOUNTER — Observation Stay (HOSPITAL_COMMUNITY)
Admission: RE | Admit: 2023-11-18 | Discharge: 2023-11-19 | Disposition: A | Discharge status: Home / Self Care | Attending: Orthopaedic Surgery | Admitting: Orthopaedic Surgery

## 2023-11-18 ENCOUNTER — Ambulatory Visit (HOSPITAL_COMMUNITY)

## 2023-11-18 ENCOUNTER — Ambulatory Visit (HOSPITAL_COMMUNITY): Payer: Self-pay

## 2023-11-18 ENCOUNTER — Observation Stay (HOSPITAL_COMMUNITY)

## 2023-11-18 ENCOUNTER — Ambulatory Visit (HOSPITAL_BASED_OUTPATIENT_CLINIC_OR_DEPARTMENT_OTHER): Payer: Self-pay

## 2023-11-18 ENCOUNTER — Encounter (HOSPITAL_COMMUNITY): Payer: Self-pay | Admitting: Orthopaedic Surgery

## 2023-11-18 ENCOUNTER — Other Ambulatory Visit: Payer: Self-pay | Admitting: Physician Assistant

## 2023-11-18 DIAGNOSIS — Z96641 Presence of right artificial hip joint: Secondary | ICD-10-CM | POA: Diagnosis not present

## 2023-11-18 DIAGNOSIS — M1612 Unilateral primary osteoarthritis, left hip: Secondary | ICD-10-CM

## 2023-11-18 DIAGNOSIS — I739 Peripheral vascular disease, unspecified: Secondary | ICD-10-CM | POA: Insufficient documentation

## 2023-11-18 DIAGNOSIS — F17210 Nicotine dependence, cigarettes, uncomplicated: Secondary | ICD-10-CM | POA: Diagnosis not present

## 2023-11-18 DIAGNOSIS — N1831 Chronic kidney disease, stage 3a: Secondary | ICD-10-CM

## 2023-11-18 DIAGNOSIS — E785 Hyperlipidemia, unspecified: Secondary | ICD-10-CM

## 2023-11-18 DIAGNOSIS — Z96651 Presence of right artificial knee joint: Secondary | ICD-10-CM | POA: Diagnosis not present

## 2023-11-18 DIAGNOSIS — Z7982 Long term (current) use of aspirin: Secondary | ICD-10-CM | POA: Insufficient documentation

## 2023-11-18 DIAGNOSIS — Z79899 Other long term (current) drug therapy: Secondary | ICD-10-CM | POA: Insufficient documentation

## 2023-11-18 DIAGNOSIS — Z96642 Presence of left artificial hip joint: Secondary | ICD-10-CM

## 2023-11-18 HISTORY — PX: TOTAL HIP ARTHROPLASTY: SHX124

## 2023-11-18 SURGERY — ARTHROPLASTY, HIP, TOTAL, ANTERIOR APPROACH
Anesthesia: Monitor Anesthesia Care | Site: Hip | Laterality: Left

## 2023-11-18 MED ORDER — ASPIRIN 81 MG PO CHEW
81.0000 mg | CHEWABLE_TABLET | Freq: Two times a day (BID) | ORAL | Status: DC
  Administered 2023-11-18 – 2023-11-19 (×2): 81 mg via ORAL
  Filled 2023-11-18 (×2): qty 1

## 2023-11-18 MED ORDER — MORPHINE SULFATE (PF) 2 MG/ML IV SOLN: 0.5000 mg | Freq: Four times a day (QID) | INTRAVENOUS | Status: DC | PRN

## 2023-11-18 MED ORDER — ONDANSETRON HCL 4 MG PO TABS: 4.0000 mg | ORAL_TABLET | Freq: Four times a day (QID) | ORAL | Status: DC | PRN

## 2023-11-18 MED ORDER — PROPOFOL 10 MG/ML IV BOLUS
INTRAVENOUS | Status: DC | PRN
  Administered 2023-11-18: 20 mg via INTRAVENOUS
  Administered 2023-11-18: 30 mg via INTRAVENOUS
  Administered 2023-11-18: 20 mg via INTRAVENOUS

## 2023-11-18 MED ORDER — HYDROCODONE-ACETAMINOPHEN 5-325 MG PO TABS: 1.0000 | ORAL_TABLET | Freq: Three times a day (TID) | ORAL | Status: DC | PRN

## 2023-11-18 MED ORDER — ACETAMINOPHEN 325 MG PO TABS: 325.0000 mg | ORAL_TABLET | Freq: Four times a day (QID) | ORAL | Status: DC | PRN

## 2023-11-18 MED ORDER — CEFAZOLIN SODIUM-DEXTROSE 2-4 GM/100ML-% IV SOLN
2.0000 g | INTRAVENOUS | Status: AC
  Administered 2023-11-18: 2 g via INTRAVENOUS
  Filled 2023-11-18: qty 100

## 2023-11-18 MED ORDER — TRANEXAMIC ACID 1000 MG/10ML IV SOLN
INTRAVENOUS | Status: DC | PRN
  Administered 2023-11-18: 2000 mg via TOPICAL

## 2023-11-18 MED ORDER — ALBUMIN HUMAN 5 % IV SOLN: INTRAVENOUS | Status: DC | PRN

## 2023-11-18 MED ORDER — MENTHOL 3 MG MT LOZG: 1.0000 | LOZENGE | OROMUCOSAL | Status: DC | PRN

## 2023-11-18 MED ORDER — ACETAMINOPHEN 500 MG PO TABS
1000.0000 mg | ORAL_TABLET | Freq: Four times a day (QID) | ORAL | Status: DC
  Administered 2023-11-18: 1000 mg via ORAL
  Filled 2023-11-18: qty 2

## 2023-11-18 MED ORDER — PRONTOSAN WOUND IRRIGATION OPTIME
TOPICAL | Status: DC | PRN
  Administered 2023-11-18: 1 via TOPICAL

## 2023-11-18 MED ORDER — DOCUSATE SODIUM 100 MG PO CAPS
100.0000 mg | ORAL_CAPSULE | Freq: Two times a day (BID) | ORAL | Status: DC
  Administered 2023-11-18 – 2023-11-19 (×3): 100 mg via ORAL
  Filled 2023-11-18 (×3): qty 1

## 2023-11-18 MED ORDER — 0.9 % SODIUM CHLORIDE (POUR BTL) OPTIME
TOPICAL | Status: DC | PRN
  Administered 2023-11-18: 1000 mL

## 2023-11-18 MED ORDER — ONDANSETRON HCL 4 MG/2ML IJ SOLN
INTRAMUSCULAR | Status: DC | PRN
  Administered 2023-11-18: 4 mg via INTRAVENOUS

## 2023-11-18 MED ORDER — POVIDONE-IODINE 10 % EX SWAB
2.0000 | Freq: Once | CUTANEOUS | Status: AC
  Administered 2023-11-18: 2 via TOPICAL

## 2023-11-18 MED ORDER — LACTATED RINGERS IV SOLN: INTRAVENOUS | Status: DC

## 2023-11-18 MED ORDER — ONDANSETRON HCL 4 MG/2ML IJ SOLN: 4.0000 mg | Freq: Four times a day (QID) | INTRAMUSCULAR | Status: DC | PRN

## 2023-11-18 MED ORDER — TAMSULOSIN HCL 0.4 MG PO CAPS
0.4000 mg | ORAL_CAPSULE | Freq: Every day | ORAL | Status: DC
  Administered 2023-11-19: 0.4 mg via ORAL
  Filled 2023-11-18: qty 1

## 2023-11-18 MED ORDER — DEXAMETHASONE SODIUM PHOSPHATE 10 MG/ML IJ SOLN
10.0000 mg | Freq: Once | INTRAMUSCULAR | Status: AC
  Administered 2023-11-19: 10 mg via INTRAVENOUS
  Filled 2023-11-18: qty 1

## 2023-11-18 MED ORDER — FENTANYL CITRATE (PF) 250 MCG/5ML IJ SOLN
INTRAMUSCULAR | Status: AC
  Filled 2023-11-18: qty 5

## 2023-11-18 MED ORDER — PANTOPRAZOLE SODIUM 40 MG PO TBEC
40.0000 mg | DELAYED_RELEASE_TABLET | Freq: Every day | ORAL | Status: DC
  Administered 2023-11-18 – 2023-11-19 (×2): 40 mg via ORAL
  Filled 2023-11-18 (×2): qty 1

## 2023-11-18 MED ORDER — CHLORHEXIDINE GLUCONATE 0.12 % MT SOLN
15.0000 mL | Freq: Once | OROMUCOSAL | Status: AC
  Administered 2023-11-18: 15 mL via OROMUCOSAL
  Filled 2023-11-18: qty 15

## 2023-11-18 MED ORDER — PHENYLEPHRINE HCL-NACL 20-0.9 MG/250ML-% IV SOLN
INTRAVENOUS | Status: DC | PRN
  Administered 2023-11-18: 40 ug/min via INTRAVENOUS

## 2023-11-18 MED ORDER — POLYETHYLENE GLYCOL 3350 17 G PO PACK
17.0000 g | PACK | Freq: Every day | ORAL | Status: DC
  Administered 2023-11-18 – 2023-11-19 (×2): 17 g via ORAL
  Filled 2023-11-18 (×2): qty 1

## 2023-11-18 MED ORDER — ALUM & MAG HYDROXIDE-SIMETH 200-200-20 MG/5ML PO SUSP: 30.0000 mL | ORAL | Status: DC | PRN

## 2023-11-18 MED ORDER — METOCLOPRAMIDE HCL 5 MG/ML IJ SOLN: 5.0000 mg | Freq: Three times a day (TID) | INTRAMUSCULAR | Status: DC | PRN

## 2023-11-18 MED ORDER — FENTANYL CITRATE (PF) 250 MCG/5ML IJ SOLN
INTRAMUSCULAR | Status: DC | PRN
  Administered 2023-11-18: 25 ug via INTRAVENOUS
  Administered 2023-11-18: 75 ug via INTRAVENOUS

## 2023-11-18 MED ORDER — EPHEDRINE SULFATE-NACL 50-0.9 MG/10ML-% IV SOSY
PREFILLED_SYRINGE | INTRAVENOUS | Status: DC | PRN
  Administered 2023-11-18: 10 mg via INTRAVENOUS

## 2023-11-18 MED ORDER — LACTATED RINGERS IV SOLN
INTRAVENOUS | Status: DC
Start: 2023-11-18 — End: 2023-11-18

## 2023-11-18 MED ORDER — ACETAMINOPHEN 500 MG PO TABS
1000.0000 mg | ORAL_TABLET | Freq: Four times a day (QID) | ORAL | Status: AC
  Administered 2023-11-18 – 2023-11-19 (×3): 1000 mg via ORAL
  Filled 2023-11-18 (×3): qty 2

## 2023-11-18 MED ORDER — DEXAMETHASONE SODIUM PHOSPHATE 10 MG/ML IJ SOLN
INTRAMUSCULAR | Status: DC | PRN
  Administered 2023-11-18: 10 mg via INTRAVENOUS

## 2023-11-18 MED ORDER — DIPHENHYDRAMINE HCL 12.5 MG/5ML PO ELIX: 25.0000 mg | ORAL_SOLUTION | ORAL | Status: DC | PRN

## 2023-11-18 MED ORDER — FENTANYL CITRATE (PF) 100 MCG/2ML IJ SOLN: 25.0000 ug | INTRAMUSCULAR | Status: DC | PRN

## 2023-11-18 MED ORDER — BUPIVACAINE-MELOXICAM ER 400-12 MG/14ML IJ SOLN
INTRAMUSCULAR | Status: AC
Start: 2023-11-18 — End: 2023-11-18
  Filled 2023-11-18: qty 1

## 2023-11-18 MED ORDER — TRANEXAMIC ACID-NACL 1000-0.7 MG/100ML-% IV SOLN
1000.0000 mg | INTRAVENOUS | Status: AC
  Administered 2023-11-18: 1000 mg via INTRAVENOUS
  Filled 2023-11-18: qty 100

## 2023-11-18 MED ORDER — VANCOMYCIN HCL 1 G IV SOLR
INTRAVENOUS | Status: DC | PRN
  Administered 2023-11-18: 1000 mg via TOPICAL

## 2023-11-18 MED ORDER — DEXAMETHASONE SODIUM PHOSPHATE 10 MG/ML IJ SOLN
INTRAMUSCULAR | Status: AC
  Filled 2023-11-18: qty 1

## 2023-11-18 MED ORDER — MIDAZOLAM HCL 2 MG/2ML IJ SOLN
INTRAMUSCULAR | Status: AC
  Filled 2023-11-18: qty 2

## 2023-11-18 MED ORDER — SORBITOL 70 % SOLN: 30.0000 mL | Freq: Every day | Status: DC | PRN

## 2023-11-18 MED ORDER — TRANEXAMIC ACID-NACL 1000-0.7 MG/100ML-% IV SOLN
1000.0000 mg | Freq: Once | INTRAVENOUS | Status: AC
  Administered 2023-11-18: 1000 mg via INTRAVENOUS
  Filled 2023-11-18: qty 100

## 2023-11-18 MED ORDER — SODIUM CHLORIDE 0.9 % IV SOLN: INTRAVENOUS | Status: DC

## 2023-11-18 MED ORDER — HYDROCODONE-ACETAMINOPHEN 7.5-325 MG PO TABS: 1.0000 | ORAL_TABLET | Freq: Three times a day (TID) | ORAL | Status: DC | PRN

## 2023-11-18 MED ORDER — METOCLOPRAMIDE HCL 5 MG PO TABS: 5.0000 mg | ORAL_TABLET | Freq: Three times a day (TID) | ORAL | Status: DC | PRN

## 2023-11-18 MED ORDER — IBUPROFEN 800 MG PO TABS: 800.0000 mg | ORAL_TABLET | Freq: Three times a day (TID) | ORAL | 1 refills | Status: DC | PRN

## 2023-11-18 MED ORDER — ONDANSETRON HCL 4 MG/2ML IJ SOLN
INTRAMUSCULAR | Status: AC
  Filled 2023-11-18: qty 2

## 2023-11-18 MED ORDER — ORAL CARE MOUTH RINSE: 15.0000 mL | Freq: Once | OROMUCOSAL | Status: AC

## 2023-11-18 MED ORDER — VANCOMYCIN HCL 1000 MG IV SOLR
INTRAVENOUS | Status: AC
  Filled 2023-11-18: qty 20

## 2023-11-18 MED ORDER — SODIUM CHLORIDE 0.9 % IR SOLN
Status: DC | PRN
  Administered 2023-11-18: 1000 mL

## 2023-11-18 MED ORDER — METHOCARBAMOL 500 MG PO TABS: 500.0000 mg | ORAL_TABLET | Freq: Four times a day (QID) | ORAL | Status: DC | PRN

## 2023-11-18 MED ORDER — MIDAZOLAM HCL 2 MG/2ML IJ SOLN
INTRAMUSCULAR | Status: DC | PRN
  Administered 2023-11-18: 2 mg via INTRAVENOUS

## 2023-11-18 MED ORDER — METHOCARBAMOL 1000 MG/10ML IJ SOLN: 500.0000 mg | Freq: Four times a day (QID) | INTRAMUSCULAR | Status: DC | PRN

## 2023-11-18 MED ORDER — PHENOL 1.4 % MT LIQD: 1.0000 | OROMUCOSAL | Status: DC | PRN

## 2023-11-18 MED ORDER — PROPOFOL 500 MG/50ML IV EMUL
INTRAVENOUS | Status: DC | PRN
  Administered 2023-11-18: 120 ug/kg/min via INTRAVENOUS

## 2023-11-18 MED ORDER — PHENYLEPHRINE 80 MCG/ML (10ML) SYRINGE FOR IV PUSH (FOR BLOOD PRESSURE SUPPORT)
PREFILLED_SYRINGE | INTRAVENOUS | Status: DC | PRN
Start: 2023-11-18 — End: 2023-11-18
  Administered 2023-11-18: 120 ug via INTRAVENOUS
  Administered 2023-11-18: 160 ug via INTRAVENOUS
  Administered 2023-11-18: 240 ug via INTRAVENOUS

## 2023-11-18 MED ORDER — CEFAZOLIN SODIUM-DEXTROSE 2-4 GM/100ML-% IV SOLN
2.0000 g | Freq: Four times a day (QID) | INTRAVENOUS | Status: AC
  Administered 2023-11-18 (×2): 2 g via INTRAVENOUS
  Filled 2023-11-18 (×2): qty 100

## 2023-11-18 MED ORDER — BUPIVACAINE IN DEXTROSE 0.75-8.25 % IT SOLN
INTRATHECAL | Status: DC | PRN
  Administered 2023-11-18: 1.8 mL via INTRATHECAL

## 2023-11-18 MED ORDER — MAGNESIUM CITRATE PO SOLN: 1.0000 | Freq: Once | ORAL | Status: DC | PRN

## 2023-11-18 MED ORDER — BUPIVACAINE-MELOXICAM ER 400-12 MG/14ML IJ SOLN
INTRAMUSCULAR | Status: DC | PRN
  Administered 2023-11-18: 400 mg

## 2023-11-18 SURGICAL SUPPLY — 50 items
BAG COUNTER SPONGE SURGICOUNT (BAG) ×1 IMPLANT
BAG DECANTER FOR FLEXI CONT (MISCELLANEOUS) ×1 IMPLANT
BLADE SAG 18X100X1.27 (BLADE) ×1 IMPLANT
CLSR STERI-STRIP ANTIMIC 1/2X4 (GAUZE/BANDAGES/DRESSINGS) IMPLANT
COVER PERINEAL POST (MISCELLANEOUS) ×1 IMPLANT
COVER SURGICAL LIGHT HANDLE (MISCELLANEOUS) ×1 IMPLANT
DERMABOND ADVANCED .7 DNX12 (GAUZE/BANDAGES/DRESSINGS) IMPLANT
DRAPE C-ARM 42X72 X-RAY (DRAPES) ×1 IMPLANT
DRAPE POUCH INSTRU U-SHP 10X18 (DRAPES) ×1 IMPLANT
DRAPE STERI IOBAN 125X83 (DRAPES) ×1 IMPLANT
DRAPE U-SHAPE 47X51 STRL (DRAPES) ×2 IMPLANT
DRSG AQUACEL AG ADV 3.5X10 (GAUZE/BANDAGES/DRESSINGS) ×1 IMPLANT
DURAPREP 26ML APPLICATOR (WOUND CARE) ×2 IMPLANT
ELECTRODE BLDE 4.0 EZ CLN MEGD (MISCELLANEOUS) ×1 IMPLANT
ELECTRODE REM PT RTRN 9FT ADLT (ELECTROSURGICAL) ×1 IMPLANT
GLOVE BIOGEL PI IND STRL 7.0 (GLOVE) ×2 IMPLANT
GLOVE BIOGEL PI IND STRL 7.5 (GLOVE) ×5 IMPLANT
GLOVE ECLIPSE 7.0 STRL STRAW (GLOVE) ×2 IMPLANT
GLOVE SURG SYN 7.5 PF PI (GLOVE) ×2 IMPLANT
GOWN STRL REUS W/ TWL LRG LVL3 (GOWN DISPOSABLE) IMPLANT
GOWN STRL REUS W/ TWL XL LVL3 (GOWN DISPOSABLE) ×1 IMPLANT
GOWN STRL SURGICAL XL XLNG (GOWN DISPOSABLE) ×1 IMPLANT
GOWN TOGA ZIPPER T7+ PEEL AWAY (MISCELLANEOUS) ×1 IMPLANT
HEAD FEMORL ARTICU/EZE 022.225 (Head) IMPLANT
HOOD PEEL AWAY T7 (MISCELLANEOUS) ×1 IMPLANT
INSERT ACET ALT 22X45 ~~LOC~~ (Insert) IMPLANT
IV NS IRRIG 3000ML ARTHROMATIC (IV SOLUTION) ×1 IMPLANT
KIT BASIN OR (CUSTOM PROCEDURE TRAY) ×1 IMPLANT
LINER DM PINNACLE HIP 52/45 (Liner) IMPLANT
MARKER SKIN DUAL TIP RULER LAB (MISCELLANEOUS) ×1 IMPLANT
NDL SPNL 18GX3.5 QUINCKE PK (NEEDLE) ×1 IMPLANT
NEEDLE SPNL 18GX3.5 QUINCKE PK (NEEDLE) ×1 IMPLANT
PACK TOTAL JOINT (CUSTOM PROCEDURE TRAY) ×1 IMPLANT
PACK UNIVERSAL I (CUSTOM PROCEDURE TRAY) ×1 IMPLANT
PIN SECTOR W/GRIP ACE CUP 52MM (Hips) IMPLANT
SCREW 6.5MMX25MM (Screw) IMPLANT
SET HNDPC FAN SPRY TIP SCT (DISPOSABLE) ×1 IMPLANT
SOLUTION PRONTOSAN WOUND 350ML (IRRIGATION / IRRIGATOR) ×1 IMPLANT
STEM FEMORAL SZ 6MM STD ACTIS (Stem) IMPLANT
SUT ETHIBOND 2 V 37 (SUTURE) ×1 IMPLANT
SUT STRATAFIX PDS+ 0 24IN (SUTURE) IMPLANT
SUT VIC AB 0 CT1 27XBRD ANBCTR (SUTURE) ×1 IMPLANT
SUT VIC AB 1 CTX36XBRD ANBCTR (SUTURE) ×1 IMPLANT
SUT VIC AB 2-0 CT1 TAPERPNT 27 (SUTURE) ×2 IMPLANT
SYR 50ML LL SCALE MARK (SYRINGE) ×1 IMPLANT
TOWEL GREEN STERILE (TOWEL DISPOSABLE) ×1 IMPLANT
TRAY CATH INTERMITTENT SS 16FR (CATHETERS) IMPLANT
TRAY FOLEY W/BAG SLVR 16FR ST (SET/KITS/TRAYS/PACK) IMPLANT
TUBE SUCT ARGYLE STRL (TUBING) ×1 IMPLANT
YANKAUER SUCT BULB TIP NO VENT (SUCTIONS) ×1 IMPLANT

## 2023-11-18 NOTE — Progress Notes (Signed)
   Subjective:  Patient reports pain as mild.    Today's  total administered Morphine Milligram Equivalents: 30  Objective:   VITALS:   Vitals:   11/18/23 1300 11/18/23 1315 11/18/23 1344 11/18/23 1641  BP: 110/60 103/64 105/63 111/65  Pulse: 68 71 73 69  Resp: 17 16 16 16   Temp:   97.6 F (36.4 C) 98.2 F (36.8 C)  TempSrc:   Oral Oral  SpO2: 96% 95% 98% 97%  Weight:      Height:        Sensation intact distally Intact pulses distally Dorsiflexion/Plantar flexion intact Incision: dressing C/D/I and no drainage   Lab Results  Component Value Date   WBC 14.5 (H) 11/13/2023   HGB 16.1 11/13/2023   HCT 49.7 11/13/2023   MCV 98.2 11/13/2023   PLT 181 11/13/2023     Assessment/Plan:  * Day of Surgery *   - Expected postop acute blood loss anemia - Up with PT/OT - walked 200 ft today - DVT ppx - SCDs, ambulation, aspirin  - WBAT operative extremity - Pain control - Discharge home tomorrow  Gerald Carpenter 11/18/2023, 8:07 PM

## 2023-11-18 NOTE — Anesthesia Procedure Notes (Signed)
 Spinal  Patient location during procedure: OR Start time: 11/18/2023 9:03 AM End time: 11/18/2023 9:06 AM Reason for block: surgical anesthesia Staffing Performed: anesthesiologist  Anesthesiologist: Maryclare Cornet, MD Performed by: Maryclare Cornet, MD Authorized by: Maryclare Cornet, MD   Preanesthetic Checklist Completed: patient identified, IV checked, risks and benefits discussed, surgical consent, monitors and equipment checked, pre-op evaluation and timeout performed Spinal Block Patient position: sitting Prep: DuraPrep Patient monitoring: cardiac monitor, continuous pulse ox and blood pressure Approach: midline Location: L3-4 Injection technique: single-shot Needle Needle type: Pencan  Needle gauge: 24 G Needle length: 9 cm Assessment Sensory level: T10 Events: CSF return Additional Notes Functioning IV was confirmed and monitors were applied. Sterile prep and drape, including hand hygiene and sterile gloves were used. The patient was positioned and the spine was prepped. The skin was anesthetized with lidocaine .  Free flow of clear CSF was obtained prior to injecting local anesthetic into the CSF.  The spinal needle aspirated freely following injection.  The needle was carefully withdrawn.  The patient tolerated the procedure well.

## 2023-11-18 NOTE — Anesthesia Preprocedure Evaluation (Signed)
 Anesthesia Evaluation  Patient identified by MRN, date of birth, ID band Patient awake    Reviewed: Allergy & Precautions, H&P , NPO status , Patient's Chart, lab work & pertinent test results  History of Anesthesia Complications (+) PONV and history of anesthetic complications  Airway Mallampati: II   Neck ROM: full    Dental   Pulmonary Current Smoker and Patient abstained from smoking.   breath sounds clear to auscultation       Cardiovascular + Peripheral Vascular Disease   Rhythm:regular Rate:Normal     Neuro/Psych  Neuromuscular disease    GI/Hepatic ,,,(+)     substance abuse  alcohol use  Endo/Other    Renal/GU      Musculoskeletal  (+) Arthritis ,    Abdominal   Peds  Hematology   Anesthesia Other Findings   Reproductive/Obstetrics                              Anesthesia Physical Anesthesia Plan  ASA: 3  Anesthesia Plan: MAC and Spinal   Post-op Pain Management:    Induction: Intravenous  PONV Risk Score and Plan: 1 and Propofol  infusion and Treatment may vary due to age or medical condition  Airway Management Planned: Simple Face Mask  Additional Equipment:   Intra-op Plan:   Post-operative Plan:   Informed Consent: I have reviewed the patients History and Physical, chart, labs and discussed the procedure including the risks, benefits and alternatives for the proposed anesthesia with the patient or authorized representative who has indicated his/her understanding and acceptance.     Dental advisory given  Plan Discussed with: CRNA, Anesthesiologist and Surgeon  Anesthesia Plan Comments:         Anesthesia Quick Evaluation

## 2023-11-18 NOTE — Discharge Instructions (Signed)

## 2023-11-18 NOTE — Op Note (Signed)
 ARTHROPLASTY, HIP, TOTAL, ANTERIOR APPROACH  Procedure Note Demoni Gergen   [id number]  Pre-op Diagnosis: left hip osteoarthritis     Post-op Diagnosis: same  Operative Findings High grade chondromalacia   Operative Procedures  1. Total hip replacement; Left hip; uncemented [id number]   Surgeon: Kay Cummins, M.D.  Assist: Ronal Morna Grave, PA-C   Anesthesia: spinal  Prosthesis: Depuy Acetabulum: Pinnacle 52 mm Femur: Actis 6 STD Head: 45/22 size: +4 Liner: lateralized liner Bearing Type: dual mobility  Total Hip Arthroplasty (Anterior Approach) Op Note:  After informed consent was obtained and the operative extremity marked in the holding area, the patient was brought back to the operating room and placed supine on the HANA table. Next, the operative extremity was prepped and draped in normal sterile fashion. Surgical timeout occurred verifying patient identification, surgical site, surgical procedure and administration of antibiotics.  A 8 cm longitudinal incision was made starting from 2 fingerbreadths lateral and inferior to the ASIS towards the lateral aspect of the patella.  A Hueter approach to the hip was performed, using the interval between tensor fascia lata and sartorius.  Dissection was carried bluntly down onto the anterior hip capsule. The lateral femoral circumflex vessels were identified and coagulated. A capsulotomy was performed and the capsular flaps tagged for later repair.  The neck osteotomy was performed 1 fingerbreadth above the lesser trochanter. The femoral head was removed which showed high grade chondromalacia, the acetabular rim was cleared of soft tissue and osteophytes and attention was turned to reaming the acetabulum.  Sequential reaming was performed under fluoroscopic guidance down to the floor of the cotyloid fossa. We reamed to a size 51 mm, and then impacted the acetabular shell. A 25 mm cancellous screw was placed to secure the shell.  A  lateralized dual mobility liner was then placed after irrigation and attention turned to the femur.  After placing the femoral hook, the leg was taken to externally rotated, extended and adducted position taking care to perform soft tissue releases to allow for adequate mobilization of the femur. Soft tissue was cleared from the shoulder of the greater trochanter and the hook elevator used to improve exposure of the proximal femur.  Lateral bone from the shoulder was rasped away for relief.  Sequential broaching performed up to a size 6.  High offset trial neck and +4 head were placed. The leg was brought back up to neutral and the construct reduced.  The position and sizing of components, offset and leg lengths were checked using fluoroscopy.  Based on fluoroscopic findings, we chose to bring the neck cut down a few millimeters due to overlengthening of the leg and excessive offset and then retrialed with standard neck and +4 head ball.  Stability of the construct was checked in 45 degrees of hip extension and 90 degrees of external rotation without any subluxation, shuck or impingement of prosthesis. We dislocated the prosthesis, dropped the leg back into position, removed trial components, and irrigated copiously. The final stem and head were chosen then placed, the leg brought back up, the system reduced and fluoroscopy used to verify positioning.  Antibiotic irrigation was placed in the surgical wound.   We irrigated, obtained hemostasis and closed the capsule using #2 ethibond suture.  A topical mixture of 0.25% bupivacaine  and meloxicam  was placed deep to the fascia.  One gram of vancomycin  powder was placed in the surgical bed.   One gram of topical tranexamic acid  was injected into the joint.  The  fascia was closed with #1 stratafix, the deep fat layer was closed with 0 vicryl, the subcutaneous layers closed with 2.0 Vicryl Plus and the skin closed with 3.0 monocryl and steri strips. A sterile dressing  was applied. The patient was awakened in the operating room and taken to recovery in stable condition.  All sponge, needle, and instrument counts were correct at the end of the case.   Morna Grave, my PA, was a medical necessity for opening, closing, limb positioning, retracting, exposing, and overall facilitation and timely completion of the surgery.  Position: supine  Complications: see description of procedure.  Time Out: performed   Drains/Packing: none  Estimated blood loss: see anesthesia record  Returned to Recovery Room: in good condition.   Antibiotics: yes   Mechanical VTE (DVT) Prophylaxis: sequential compression devices, TED thigh-high  Chemical VTE (DVT) Prophylaxis: aspirin    Fluid Replacement: see anesthesia record  Specimens Removed: 1 to pathology   Sponge and Instrument Count Correct? yes   PACU: portable radiograph - low AP   Plan/RTC: Return in 2 weeks for suture removal. Weight Bearing/Load Lower Extremity: full  Hip precautions: none Suture Removal: 2 weeks   N. Ozell Cummins, MD [facility] 10:20 AM   Implant Name Type Inv. Item Serial No. Manufacturer Lot No. LRB No. Used Action  PIN SECTOR W/GRIP ACE CUP - [id number] Hips PIN SECTOR W/GRIP ACE CUP  [employer] [id number] Left 1 Implanted  LINER DM PINNACLE HIP 52/45 - [id number] Liner LINER DM PINNACLE HIP 52/45  [employer] [id number] Left 1 Implanted  SCREW 6.5MMX25MM - [id number] Screw SCREW 6.5MMX25MM  [employer] [id number] Left 1 Implanted  HEAD FEMORL ARTICU/EZE 022.225 - [id number] Head HEAD FEMORL ARTICU/EZE 022.225  [employer] [id number] Left 1 Implanted  INSERT ACET ALT 22X45 [place] - [id number] Insert INSERT ACET ALT 22X45 [place]  DE[employer] [id number] Left 1 Implanted  STEM FEMORAL SZ STD ACTIS - [id number] Stem STEM FEMORAL SZ STD ACTIS  [employer] [id number] Left 1 Implanted

## 2023-11-18 NOTE — Transfer of Care (Signed)
 Immediate Anesthesia Transfer of Care Note  Patient: Gerald Carpenter  Procedure(s) Performed: ARTHROPLASTY, HIP, TOTAL, ANTERIOR APPROACH (Left: Hip)  Patient Location: PACU  Anesthesia Type:Spinal  Level of Consciousness: awake and alert   Airway & Oxygen Therapy: Patient Spontanous Breathing  Post-op Assessment: Report given to RN and Post -op Vital signs reviewed and stable  Post vital signs: Reviewed and stable  Last Vitals:  Vitals Value Taken Time  BP 91/57 11/18/23 10:53  Temp    Pulse 68 11/18/23 10:55  Resp 18 11/18/23 10:55  SpO2 95 % 11/18/23 10:55  Vitals shown include unfiled device data.  Last Pain:  Vitals:   11/18/23 0700  TempSrc:   PainSc: 0-No pain         Complications: No notable events documented.

## 2023-11-18 NOTE — Evaluation (Signed)
 Physical Therapy Evaluation Patient Details Name: Gerald Carpenter MRN: [id number] DOB: 1950/08/22 Today's Date: 11/18/2023  History of Present Illness  73 y.o. male who presents 11/18/23 for L THA (anterior)  PMH PVD R THA, R TKA, back surg  arthritis,  Clinical Impression  Patient is s/p above surgery resulting in functional limitations due to the deficits listed below (see PT Problem List). Patient familiar with PT procedures due to multiple previous surgeries. Did require cuing for safe use of RW during transfer sit to stand. Progressed to ambulate 200 ft with supervision with RW. Initiated education on HEP. Patient will benefit from acute skilled PT to increase their independence and safety with mobility to facilitate discharge.         If plan is discharge home, recommend the following: A little help with walking and/or transfers;A little help with bathing/dressing/bathroom;Assistance with cooking/housework;Assist for transportation;Help with stairs or ramp for entrance   Can travel by private vehicle        Equipment Recommendations None recommended by PT  Recommendations for Other Services       Functional Status Assessment Patient has had a recent decline in their functional status and demonstrates the ability to make significant improvements in function in a reasonable and predictable amount of time.     Precautions / Restrictions Precautions Precautions: None Restrictions Weight Bearing Restrictions Per Provider Order: No      Mobility  Bed Mobility Overal bed mobility: Modified Independent             General bed mobility comments: out of and into bed    Transfers Overall transfer level: Needs assistance Equipment used: Rolling walker (2 wheels), None Transfers: Sit to/from Stand, Bed to chair/wheelchair/BSC Sit to Stand: Supervision   Step pivot transfers: Min assist       General transfer comment: initial bed to chair with HHA and no device per pt's  request; for longer walk, agreed he needed with RW; vc for hand placement    Ambulation/Gait Ambulation/Gait assistance: Supervision Gait Distance (Feet): 200 Feet Assistive device: Rolling walker (2 wheels) Gait Pattern/deviations: Step-through pattern, Decreased stride length, Trunk flexed   Gait velocity interpretation: 1.31 - 2.62 ft/sec, indicative of limited community ambulator   General Gait Details: vc for upright posture and proximity to [employer]:  (pt deferred; able to verbalize correct sequence from prior surgeries)          Wheelchair Mobility     Tilt Bed    Modified Rankin (Stroke Patients Only)       Balance                                             Pertinent Vitals/Pain Pain Assessment Pain Assessment: 0-10 Pain Score: 2  Pain Location: left hip Pain Descriptors / Indicators: Operative site guarding Pain Intervention(s): Limited activity within patient's tolerance, Monitored during session    Home Living Family/patient expects to be discharged to:: Private residence Living Arrangements: Spouse/significant other;Children Available Help at Discharge: Family;Available 24 hours/day Type of Home: House Home Access: Stairs to enter Entrance Stairs-Rails: Right;Left;Can reach both Entrance Stairs-Number of Steps: 4   Home Layout: One level Home Equipment: Shower seat - built in;Shower seat;Grab bars - Chartered loss adjuster (2 wheels);Cane - single point;BSC/3in1      Prior Function Prior Level of Function : Independent/Modified Independent  Mobility Comments: cane occasionally for security       Extremity/Trunk Assessment   Upper Extremity Assessment Upper Extremity Assessment: Overall WFL for tasks assessed    Lower Extremity Assessment Lower Extremity Assessment: LLE deficits/detail LLE Deficits / Details: post-op soreness/stiffness; AROM hip flexion 110, knee flexion 110    Cervical /  Trunk Assessment Cervical / Trunk Assessment: Normal  Communication   Communication Communication: No apparent difficulties    Cognition Arousal: Alert Behavior During Therapy: WFL for tasks assessed/performed   PT - Cognitive impairments: No apparent impairments                         Following commands: Intact       Cueing Cueing Techniques: Verbal cues     General Comments General comments (skin integrity, edema, etc.): Wife present and very attentive    Exercises General Exercises - Lower Extremity Ankle Circles/Pumps: AROM, Both, 10 reps Long Arc Quad: AROM, Both, 10 reps Hip Flexion/Marching: AROM, Left, 10 reps, Seated   Assessment/Plan    PT Assessment Patient needs continued PT services  PT Problem List Decreased strength;Decreased balance;Decreased mobility;Decreased knowledge of use of DME;Pain       PT Treatment Interventions DME instruction;Gait training;Functional mobility training;Therapeutic activities;Therapeutic exercise;Patient/family education    PT Goals (Current goals can be found in the Care Plan section)  Acute Rehab PT Goals Patient Stated Goal: go home tomorrow a.m. PT Goal Formulation: With patient Time For Goal Achievement: 12/02/23 Potential to Achieve Goals: Good    Frequency 7X/week     Co-evaluation               AM-PAC PT 6 Clicks Mobility  Outcome Measure Help needed turning from your back to your side while in a flat bed without using bedrails?: None Help needed moving from lying on your back to sitting on the side of a flat bed without using bedrails?: None Help needed moving to and from a bed to a chair (including a wheelchair)?: A Little Help needed standing up from a chair using your arms (e.g., wheelchair or bedside chair)?: A Little Help needed to walk in hospital room?: A Little Help needed climbing 3-5 steps with a railing? : A Little 6 Click Score: 20    End of Session Equipment Utilized During  Treatment: Gait belt Activity Tolerance: Patient tolerated treatment well Patient left: in bed;with call bell/phone within reach;with family/visitor present Nurse Communication: Mobility status PT Visit Diagnosis: Other abnormalities of gait and mobility (R26.89);Pain Pain - Right/Left: Left Pain - part of body: Hip    Time: 8341-8276 PT Time Calculation (min) (ACUTE ONLY): 25 min   Charges:   PT Evaluation $PT Eval Low Complexity: 1 Low PT Treatments $Therapeutic Exercise: 8-22 mins PT General Charges $$ ACUTE PT VISIT: 1 Visit          Macario RAMAN, PT Acute Rehabilitation Services  Office [phone]   Macario SHAUNNA Soja 11/18/2023, 5:39 PM

## 2023-11-18 NOTE — H&P (Signed)
 PREOPERATIVE H&P  Chief Complaint: left hip osteoarthritis  HPI: Gerald Carpenter is a 73 y.o. male who presents for surgical treatment of left hip osteoarthritis.  He denies any changes in medical history.  Past Surgical History:  Procedure Laterality Date  . ABDOMINAL AORTOGRAM W/LOWER EXTREMITY N/A 06/23/2020   Procedure: ABDOMINAL AORTOGRAM W/LOWER EXTREMITY;  Surgeon: Gretta Lonni PARAS, MD;  Location: [facility];  Service: Cardiovascular;  Laterality: N/A;  . ANTERIOR LAT LUMBAR FUSION Right 02/05/2019   Procedure: ATTEMPTED ANTERIOR LATERAL INTERBODY FUSION;  Surgeon: Lanis Pupa, MD;  Location: [facility] OR;  Service: Neurosurgery;  Laterality: Right;  RIGHT LATERAL TRANSPSOAS DISCECTOMY AND FUSION WITH ROBOTIC ASSISTED PEDICLE SCREW STABILIZATION, LUMBAR 4- LUMBAR 5  . CATARACT EXTRACTION W/ INTRAOCULAR LENS  IMPLANT, BILATERAL Bilateral 2013   lens implants for cataracts after Lasik  . COLONOSCOPY    . ENDOVEIN HARVEST OF GREATER SAPHENOUS VEIN Left 06/29/2020   Procedure: HARVEST OF LEFT GREATER SAPHENOUS VEIN;  Surgeon: Gretta Lonni PARAS, MD;  Location: [facility] OR;  Service: Vascular;  Laterality: Left;  . EYE SURGERY  2013  . FEMORAL-POPLITEAL BYPASS GRAFT Left 06/29/2020   Procedure: BYPASS GRAFT LEFT COMMON FEMORAL TO ABOVE KNEE POPLITEAL ARTERY;  Surgeon: Gretta Lonni PARAS, MD;  Location: [facility] OR;  Service: Vascular;  Laterality: Left;  . HEMORRHOID SURGERY N/A 04/06/2021   Procedure: HEMORRHOIDECTOMY 2 COLUMN;  Surgeon: Teresa Lonni HERO, MD;  Location: [facility] [place];  Service: General;  Laterality: N/A;  . KNEE ARTHROSCOPY Right 1980  . RECTAL EXAM UNDER ANESTHESIA N/A 04/06/2021   Procedure: ANORECTAL EXAM UNDER ANESTHESIA/ excision of anal polyp;  Surgeon: Teresa Lonni HERO, MD;  Location: [facility];  Service: General;  Laterality: N/A;  . TONSILLECTOMY     at a very young age, but grew back  . TOTAL HIP ARTHROPLASTY  Right 05/25/2019   Procedure: RIGHT TOTAL HIP ARTHROPLASTY ANTERIOR APPROACH;  Surgeon: Jerri Kay HERO, MD;  Location: [facility] OR;  Service: Orthopedics;  Laterality: Right;  . TOTAL KNEE ARTHROPLASTY Right 09/04/2021   Procedure: RIGHT TOTAL KNEE ARTHROPLASTY;  Surgeon: Jerri Kay HERO, MD;  Location: [facility] OR;  Service: Orthopedics;  Laterality: Right;   Social History   Socioeconomic History  . Marital status: Married    Spouse name: Edwinna  . Number of children: 3  . Years of education: Not on file  . Highest education level: 12th grade  Occupational History  . Occupation: retired  Tobacco Use  . Smoking status: Some Days    Current packs/day: 0.25    Average packs/day: 0.3 packs/day for 53.2 years (13.3 ttl pk-yrs)    Types: Cigarettes  . Smokeless tobacco: Never  . Tobacco comments:    had first cigarettes age  60, habit started at age 28/today he 47 cig a day  Vaping Use  . Vaping status: Never Used  Substance and Sexual Activity  . Alcohol use: Not Currently    Comment: 05/10/1991  . Drug use: Not Currently    Comment: prior usage of speed  when drinking  . Sexual activity: Yes  Other Topics Concern  . Not on file  Social History Narrative   Recently moved here from [facility], [place]   Has total of 4 children 1 adopted   Right handed   Caffeine 3 cup a day    Live in 2 story home   Social Drivers of Health   Financial Resource Strain: Low Risk  (08/24/2023)  Overall Physicist, medical Strain (CARDIA)   . Difficulty of Paying Living Expenses: Not very hard  Food Insecurity: Food Insecurity Present (08/24/2023)   Hunger Vital Sign   . Worried About Programme researcher, broadcasting/film/video in the Last Year: Never true   . Ran Out of Food in the Last Year: Sometimes true  Transportation Needs: No Transportation Needs (08/24/2023)   PRAPARE - Transportation   . Lack of Transportation (Medical): No   . Lack of Transportation (Non-Medical): No  Physical Activity: Inactive (08/24/2023)   Exercise Vital  Sign   . Days of Exercise per Week: 0 days   . Minutes of Exercise per Session: Not on file  Stress: No Stress Concern Present (08/24/2023)   [employer] of Occupational Health - Occupational Stress Questionnaire   . Feeling of Stress: Not at all  Social Connections: Moderately Isolated (08/24/2023)   Social Connection and Isolation Panel   . Frequency of Communication with Friends and Family: More than three times a week   . Frequency of Social Gatherings with Friends and Family: Once a week   . Attends Religious Services: Never   . Active Member of Clubs or Organizations: No   . Attends Banker Meetings: Not on file   . Marital Status: Married   Family History  Problem Relation Age of Onset  . Pulmonary fibrosis Mother   . Breast cancer Mother   . Hyperlipidemia Mother   . Hypertension Mother   . Other Mother        Scarlet Fever  . Heart disease Father   . Hyperlipidemia Father   . Diabetes Brother   . Hypertension Brother   . Hypertension Brother   . Breast cancer Maternal Aunt   . Prostate cancer Paternal Grandfather   . Hypothyroidism Daughter   . Alcohol abuse Son   . Colon cancer Neg Hx   . Esophageal cancer Neg Hx   . Ulcerative colitis Neg Hx   . Uterine cancer Neg Hx    Allergies  Allergen Reactions  . Anesthetics, Amide Nausea And Vomiting  . Percocet [Oxycodone -Acetaminophen ] Nausea And Vomiting   Prior to Admission medications   Medication Sig Start Date End Date Taking? Authorizing Provider  aspirin  (ASPIRIN  81) 81 MG chewable tablet Chew 1 tablet (81 mg total) by mouth in the morning and at bedtime. To be taken after surgery to prevent blood clots Patient taking differently: Chew 81 mg by mouth daily. 11/06/23  Yes Jule Ronal CROME, PA-C  atorvastatin  (LIPITOR) 40 MG tablet TAKE 1 TABLET(40 MG) BY MOUTH DAILY 09/25/23  Yes Thedora Garnette HERO, MD  docusate sodium  (COLACE) 100 MG capsule Take 1 capsule (100 mg total) by mouth daily as needed.  11/06/23 11/05/24  Jule Ronal CROME, PA-C  methocarbamol  (ROBAXIN ) 750 MG tablet Take 1 tablet (750 mg total) by mouth 3 (three) times daily as needed. 11/06/23   Jule Ronal CROME, PA-C  ondansetron  (ZOFRAN ) 4 MG tablet Take 1 tablet (4 mg total) by mouth every 8 (eight) hours as needed for nausea or vomiting. 11/06/23   Jule Ronal CROME, PA-C  oxyCODONE -acetaminophen  (PERCOCET) 5-325 MG tablet Take 1-2 tablets by mouth every 6 (six) hours as needed. To be taken after surgery Patient not taking: Reported on 11/11/2023 11/06/23   Jule Ronal CROME, PA-C  tamsulosin  (FLOMAX ) 0.4 MG CAPS capsule TAKE 1 CAPSULE(0.4 MG) BY MOUTH IN THE MORNING 01/28/23  Yes Rudd, Garnette HERO, MD  Wheat Dextrin-Vit B6-B12-FA (BENEFIBER PLUS HEART HEALTH PO) Take  2 Scoops by mouth daily.   Yes [provider]  traMADol  (ULTRAM ) 50 MG tablet Take 1-2 tablets (50-100 mg total) by mouth daily as needed. Patient not taking: Reported on 11/11/2023 08/21/23   Jerri Kay HERO, MD     Positive ROS: All other systems have been reviewed and were otherwise negative with the exception of those mentioned in the HPI and as above.  Physical Exam: General: Alert, no acute distress Cardiovascular: No pedal edema Respiratory: No cyanosis, no use of accessory musculature GI: abdomen soft Skin: No lesions in the area of chief complaint Neurologic: Sensation intact distally Psychiatric: Patient is competent for consent with normal mood and affect Lymphatic: no lymphedema  MUSCULOSKELETAL: exam stable  Assessment: left hip osteoarthritis  Plan: Plan for Procedure(s): ARTHROPLASTY, HIP, TOTAL, ANTERIOR APPROACH  The risks benefits and alternatives were discussed with the patient including but not limited to the risks of nonoperative treatment, versus surgical intervention including infection, bleeding, nerve injury,  blood clots, cardiopulmonary complications, morbidity, mortality, among others, and they were willing to proceed.    Ozell Jerri, MD 11/18/2023 5:59 AM

## 2023-11-19 ENCOUNTER — Other Ambulatory Visit: Payer: Self-pay | Admitting: Physician Assistant

## 2023-11-19 ENCOUNTER — Encounter (HOSPITAL_COMMUNITY): Payer: Self-pay | Admitting: Orthopaedic Surgery

## 2023-11-19 DIAGNOSIS — M1612 Unilateral primary osteoarthritis, left hip: Secondary | ICD-10-CM | POA: Diagnosis not present

## 2023-11-19 MED ORDER — IBUPROFEN 800 MG PO TABS: 800.0000 mg | ORAL_TABLET | Freq: Three times a day (TID) | ORAL | 1 refills | Status: AC | PRN

## 2023-11-19 NOTE — Progress Notes (Addendum)
 Discharge Nurse Summary: DC order noted per MD. DC RN at bedside with patient. Patient agreeable with discharge plan, states family will arrive soon for pickup. AVS printed/reviewed. PIV removed, skin intact. No DME needs. No home/TOC meds. CP/Edu resolved. Telemonitor returned to charging station. All belongings accounted for.   Dressing to wound, CDI w/o bleeding or drainage. See LDAs. Meds rerouted to preferred Rx per PA Stanberry. Patient instructed to take omeprazole OTC per PA Stanberry recommendation with Ibuprofen, patient verbalized understanding. Patient wheeled downstairs for discharge by private auto.   Rosario EMERSON Lund, RN

## 2023-11-19 NOTE — Anesthesia Postprocedure Evaluation (Signed)
 Anesthesia Post Note  Patient: Curlee Bogan  Procedure(s) Performed: ARTHROPLASTY, HIP, TOTAL, ANTERIOR APPROACH (Left: Hip)     Patient location during evaluation: PACU Anesthesia Type: MAC and Spinal Level of consciousness: oriented and awake and alert Pain management: pain level controlled Vital Signs Assessment: post-procedure vital signs reviewed and stable Respiratory status: spontaneous breathing, respiratory function stable and patient connected to nasal cannula oxygen Cardiovascular status: blood pressure returned to baseline and stable Postop Assessment: no headache, no backache and no apparent nausea or vomiting Anesthetic complications: no   No notable events documented.  Last Vitals:  Vitals:   11/18/23 2037 11/19/23 0332  BP: (!) 103/55 114/60  Pulse: 73 67  Resp:    Temp: 36.8 C 36.5 C  SpO2: 92% 97%    Last Pain:  Vitals:   11/19/23 0332  TempSrc: Oral  PainSc:                  Abhimanyu Cruces S

## 2023-11-19 NOTE — Progress Notes (Addendum)
 Subjective: 1 Day Post-Op Procedure(s) (LRB): ARTHROPLASTY, HIP, TOTAL, ANTERIOR APPROACH (Left) Patient reports pain as mild.    Objective: Vital signs in last 24 hours: Temp:  [97.5 F (36.4 C)-98.3 F (36.8 C)] 97.7 F (36.5 C) (09/30 0332) Pulse Rate:  [61-73] 67 (09/30 0332) Resp:  [14-20] 16 (09/29 1641) BP: (76-121)/(53-65) 114/60 (09/30 0332) SpO2:  [92 %-98 %] 97 % (09/30 0332)  Intake/Output from previous day: 09/29 0701 - 09/30 0700 In: 1647.9 [P.O.:240; I.V.:857.9; IV Piggyback:550] Out: 1125 [Urine:950; Blood:175] Intake/Output this shift: Total I/O In: -  Out: 125 [Urine:125]  No results for input(s): HGB in the last 72 hours. No results for input(s): WBC, RBC, HCT, PLT in the last 72 hours. No results for input(s): NA, K, CL, CO2, BUN, CREATININE, GLUCOSE, CALCIUM  in the last 72 hours. No results for input(s): LABPT, INR in the last 72 hours.  Neurologically intact Neurovascular intact Sensation intact distally Intact pulses distally Dorsiflexion/Plantar flexion intact Incision: dressing C/D/I No cellulitis present Compartment soft   Assessment/Plan: 1 Day Post-Op Procedure(s) (LRB): ARTHROPLASTY, HIP, TOTAL, ANTERIOR APPROACH (Left) Advance diet Up with therapy D/C IV fluids Discharge home with home health WBAT LLE Post-op meds sent to pharmacy pre-op     Gerald Carpenter Grave 11/19/2023, 8:22 AM

## 2023-11-19 NOTE — Progress Notes (Signed)
 Physical Therapy Treatment Patient Details Name: Gerald Carpenter MRN: [id number] DOB: February 14, 1951 Today's Date: 11/19/2023   History of Present Illness 73 y.o. male who presents 11/18/23 for L THA (anterior)  PMH PVD R THA, R TKA, back surg  arthritis,    PT Comments  Pt resting in bed on arrival, pleasant and eager for mobility. Pt demonstrating bed mobility, transfers and gait with RW for support with grossly supervision for safety. Pt was educated on continued walker use to maximize functional independence, safety, and decrease risk for falls as well as appropriate activity progression, safe car entry/exit, ice, HEP and compliance and importance of continued mobility with pt verbalizing understanding. Anticipate safe discharge, with assist level outlined below, once medically cleared, will continue to follow acutely.     If plan is discharge home, recommend the following: A little help with walking and/or transfers;A little help with bathing/dressing/bathroom;Assistance with cooking/housework;Assist for transportation;Help with stairs or ramp for entrance   Can travel by private vehicle        Equipment Recommendations  None recommended by PT    Recommendations for Other Services       Precautions / Restrictions Precautions Precautions: None Restrictions Weight Bearing Restrictions Per Provider Order: No     Mobility  Bed Mobility Overal bed mobility: Modified Independent                  Transfers Overall transfer level: Needs assistance Equipment used: Rolling walker (2 wheels), None Transfers: Sit to/from Stand, Bed to chair/wheelchair/BSC Sit to Stand: Supervision           General transfer comment: good hand placement, no assist needed, good eccentric control to sit    Ambulation/Gait Ambulation/Gait assistance: Supervision Gait Distance (Feet): 350 Feet Assistive device: Rolling walker (2 wheels) Gait Pattern/deviations: Step-through pattern, Decreased  stride length, Trunk flexed Gait velocity: decr     General Gait Details: good recall for positioning in RW, decreased hip/knee flexion on L   Stairs             Wheelchair Mobility     Tilt Bed    Modified Rankin (Stroke Patients Only)       Balance                                            Communication Communication Communication: No apparent difficulties  Cognition Arousal: Alert Behavior During Therapy: WFL for tasks assessed/performed   PT - Cognitive impairments: No apparent impairments                         Following commands: Intact      Cueing Cueing Techniques: Verbal cues  Exercises General Exercises - Lower Extremity Hip Flexion/Marching: AROM, Left, 10 reps, Standing    General Comments        Pertinent Vitals/Pain Pain Assessment Pain Assessment: Faces Faces Pain Scale: Hurts a little bit Pain Location: left hip Pain Descriptors / Indicators: Tightness (stiff) Pain Intervention(s): Monitored during session, Limited activity within patient's tolerance, Repositioned    Home Living                          Prior Function            PT Goals (current goals can now be found in the care plan section) Acute  Rehab PT Goals Patient Stated Goal: to go home PT Goal Formulation: With patient Time For Goal Achievement: 12/02/23 Progress towards PT goals: Progressing toward goals    Frequency    7X/week      PT Plan      Co-evaluation              AM-PAC PT 6 Clicks Mobility   Outcome Measure  Help needed turning from your back to your side while in a flat bed without using bedrails?: None Help needed moving from lying on your back to sitting on the side of a flat bed without using bedrails?: None Help needed moving to and from a bed to a chair (including a wheelchair)?: A Little Help needed standing up from a chair using your arms (e.g., wheelchair or bedside chair)?: A  Little Help needed to walk in hospital room?: A Little Help needed climbing 3-5 steps with a railing? : A Little 6 Click Score: 20    End of Session   Activity Tolerance: Patient tolerated treatment well Patient left: with call bell/phone within reach;in chair Nurse Communication: Mobility status PT Visit Diagnosis: Other abnormalities of gait and mobility (R26.89);Pain Pain - Right/Left: Left Pain - part of body: Hip     Time: 0842-0900 PT Time Calculation (min) (ACUTE ONLY): 18 min  Charges:    $Gait Training: 8-22 mins PT General Charges $$ ACUTE PT VISIT: 1 Visit                     Dayon Witt R. PTA Acute Rehabilitation Services Office: [phone]   Therisa CHRISTELLA Boor 11/19/2023, 9:03 AM

## 2023-11-19 NOTE — Discharge Summary (Signed)
 Patient ID: Gerald Carpenter MRN: [id number] DOB/AGE: 06-14-1950 73 y.o.  Admit date: 11/18/2023 Discharge date: 11/19/2023  Admission Diagnoses:  Principal Problem:   Primary osteoarthritis of left hip Active Problems:   Status post total replacement of left hip   Discharge Diagnoses:  Same  Past Medical History:  Diagnosis Date   Alcoholism (HCC)    Arthritis    Cataract    Colon polyps    Complication of anesthesia    HLD (hyperlipidemia)    Nephritis    when pt. was 7 yrs. ols   Peripheral vascular disease    PONV (postoperative nausea and vomiting)    Only when I have aortagram on 06/23/20.   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   Substance abuse (HCC)     Surgeries: Procedure(s): ARTHROPLASTY, HIP, TOTAL, ANTERIOR APPROACH on 11/18/2023   Consultants:   Discharged Condition: Improved  Hospital Course: Gerald Carpenter is an 73 y.o. male who was admitted 11/18/2023 for operative treatment ofPrimary osteoarthritis of left hip. Patient has severe unremitting pain that affects sleep, daily activities, and work/hobbies. After pre-op clearance the patient was taken to the operating room on 11/18/2023 and underwent  Procedure(s): ARTHROPLASTY, HIP, TOTAL, ANTERIOR APPROACH.    Patient was given perioperative antibiotics:  Anti-infectives (From admission, onward)    Start     Dose/Rate Route Frequency Ordered Stop   11/18/23 1500  ceFAZolin  (ANCEF ) IVPB 2g/100 mL premix        2 g 200 mL/hr over 30 Minutes Intravenous Every 6 hours 11/18/23 1343 11/18/23 2150   11/18/23 0932  vancomycin  (VANCOCIN ) powder  Status:  Discontinued          As needed 11/18/23 0933 11/18/23 1040   11/18/23 0615  ceFAZolin  (ANCEF ) IVPB 2g/100 mL premix        2 g 200 mL/hr over 30 Minutes Intravenous On call to O.R. 11/18/23 [id number] 11/18/23 0913        Patient was given sequential compression devices, early ambulation, and chemoprophylaxis to prevent DVT.  Inpatient Morphine  Milligram Equivalents Per Day 9/29 - 9/30   Values displayed are in units of MME/Day    Order Start / End Date Yesterday Today    HYDROcodone -acetaminophen  (NORCO/VICODIN) 5-325 MG per tablet 1-2 tablet 9/29 - No end date 0 of 10-20 0 of 15-30    HYDROcodone -acetaminophen  (NORCO) 7.5-325 MG per tablet 1-2 tablet 9/29 - No end date 0 of 15-30 0 of 22.5-45    morphine (PF) 2 MG/ML injection 0.5-1 mg 9/29 - No end date 0 of 3-6 0 of 6-12    fentaNYL  (SUBLIMAZE ) injection 25-50 mcg 9/29 - 9/29 0 of 45-90 --    fentaNYL  citrate (PF) (SUBLIMAZE ) injection 9/29 - 9/29 *30 of 30 --    Daily Totals  * 30 of 103-176 0 of 43.5-87  *One-Step medication     Patient benefited maximally from hospital stay and there were no complications.    Recent vital signs: Patient Vitals for the past 24 hrs:  BP Temp Temp src Pulse Resp SpO2  11/19/23 0332 114/60 97.7 F (36.5 C) Oral 67 -- 97 %  11/18/23 2037 (!) 103/55 98.3 F (36.8 C) -- 73 -- 92 %  11/18/23 1641 111/65 98.2 F (36.8 C) Oral 69 16 97 %  11/18/23 1344 105/63 97.6 F (36.4 C) Oral 73 16 98 %  11/18/23 1315 103/64 -- -- 71 16 95 %  11/18/23 1300 110/60 -- -- 68 17 96 %  11/18/23  1245 91/65 -- -- 62 19 95 %  11/18/23 1230 101/61 -- -- 64 16 95 %  11/18/23 1215 101/61 -- -- 65 19 96 %  11/18/23 1200 101/61 -- -- 61 18 95 %  11/18/23 1145 100/60 -- -- 64 18 95 %  11/18/23 1130 105/60 -- -- 62 17 96 %  11/18/23 1115 109/61 -- -- 63 17 97 %  11/18/23 1100 120/63 -- -- 66 20 98 %  11/18/23 1058 121/61 -- -- 62 17 94 %  11/18/23 1045 (!) 76/53 (!) 97.5 F (36.4 C) -- 62 14 96 %     Recent laboratory studies: No results for input(s): WBC, HGB, HCT, PLT, NA, K, CL, CO2, BUN, CREATININE, GLUCOSE, INR, CALCIUM  in the last 72 hours.  Invalid input(s): PT, 2   Discharge Medications:   Allergies as of 11/19/2023       Reactions   Anesthetics, Amide Nausea And Vomiting   Percocet [oxycodone -acetaminophen ] Nausea  And Vomiting        Medication List     STOP taking these medications    oxyCODONE -acetaminophen  5-325 MG tablet Commonly known as: Percocet   traMADol  50 MG tablet Commonly known as: ULTRAM        TAKE these medications    aspirin  81 MG chewable tablet Commonly known as: Aspirin  81 Chew 1 tablet (81 mg total) by mouth in the morning and at bedtime. To be taken after surgery to prevent blood clots What changed:  when to take this additional instructions   atorvastatin  40 MG tablet Commonly known as: LIPITOR TAKE 1 TABLET(40 MG) BY MOUTH DAILY   BENEFIBER PLUS HEART HEALTH PO Take 2 Scoops by mouth daily.   docusate sodium  100 MG capsule Commonly known as: Colace Take 1 capsule (100 mg total) by mouth daily as needed.   ibuprofen 800 MG tablet Commonly known as: ADVIL Take 1 tablet (800 mg total) by mouth every 8 (eight) hours as needed. Please take with food and please also take something like omeprazole or zantac to coat your stomach   methocarbamol  750 MG tablet Commonly known as: ROBAXIN  Take 1 tablet (750 mg total) by mouth 3 (three) times daily as needed.   ondansetron  4 MG tablet Commonly known as: Zofran  Take 1 tablet (4 mg total) by mouth every 8 (eight) hours as needed for nausea or vomiting.   tamsulosin  0.4 MG Caps capsule Commonly known as: FLOMAX  TAKE 1 CAPSULE(0.4 MG) BY MOUTH IN THE MORNING               Durable Medical Equipment  (From admission, onward)           Start     Ordered   11/18/23 1344  DME Walker rolling  Once       Question:  Patient needs a walker to treat with the following condition  Answer:  History of hip replacement   11/18/23 1343   11/18/23 1344  DME 3 n 1  Once        11/18/23 1343   11/18/23 1344  DME Bedside commode  Once       Question:  Patient needs a bedside commode to treat with the following condition  Answer:  History of hip replacement   11/18/23 1343            Diagnostic Studies: DG  Pelvis Portable Result Date: 11/18/2023 CLINICAL DATA:  Postop left hip arthroplasty. EXAM: PORTABLE PELVIS 1-2 VIEWS COMPARISON:  None Available. FINDINGS: Left  hip arthroplasty in expected alignment. No periprosthetic lucency or fracture. Recent postsurgical change includes air and edema in the soft tissues. Previous right hip arthroplasty IMPRESSION: Left hip arthroplasty without immediate postoperative complication. Electronically Signed   By: Andrea Gasman M.D.   On: 11/18/2023 14:55   DG HIP UNILAT WITH PELVIS 1V LEFT Result Date: 11/18/2023 CLINICAL DATA:  Left hip arthroplasty. EXAM: DG HIP (WITH OR WITHOUT PELVIS) 1V*L* COMPARISON:  Pelvic radiograph dated 07/02/2023. FINDINGS: Nine intraoperative fluoroscopic spot images provided. The total fluoroscopic time is 23.6 seconds with a cumulative air kerma of 2.05 mGy. There has been interval placement of a left hip arthroplasty. IMPRESSION: Intraoperative fluoroscopic spot images of left hip arthroplasty. Electronically Signed   By: Vanetta Chou M.D.   On: 11/18/2023 14:14   DG C-Arm 1-60 Min-No Report Result Date: 11/18/2023 Fluoroscopy was utilized by the requesting physician.  No radiographic interpretation.   DG C-Arm 1-60 Min-No Report Result Date: 11/18/2023 Fluoroscopy was utilized by the requesting physician.  No radiographic interpretation.    Disposition: Discharge disposition: 01-Home or Self Care          Follow-up Information     Jule Ronal CROME, PA-C. Schedule an appointment as soon as possible for a visit in 2 week(s).   Specialty: Orthopedic Surgery Contact information: [address] [place]  [place] [address] [phone]                  Signed: Ronal CROME Jule 11/19/2023, 8:23 AM

## 2023-11-19 NOTE — Plan of Care (Signed)
  Problem: Education: Goal: Knowledge of General Education information will improve Description: Including pain rating scale, medication(s)/side effects and non-pharmacologic comfort measures Outcome: Progressing   Problem: Clinical Measurements: Goal: Ability to maintain clinical measurements within normal limits will improve Outcome: Progressing   Problem: Nutrition: Goal: Adequate nutrition will be maintained Outcome: Progressing   Problem: Coping: Goal: Level of anxiety will decrease Outcome: Progressing   Problem: Pain Managment: Goal: General experience of comfort will improve and/or be controlled Outcome: Progressing

## 2023-11-20 ENCOUNTER — Other Ambulatory Visit: Payer: Self-pay | Admitting: Physician Assistant

## 2023-11-20 ENCOUNTER — Telehealth: Payer: Self-pay | Admitting: *Deleted

## 2023-11-20 MED ORDER — METHOCARBAMOL 750 MG PO TABS: 750.0000 mg | ORAL_TABLET | Freq: Three times a day (TID) | ORAL | 2 refills | Status: AC | PRN

## 2023-11-20 NOTE — Telephone Encounter (Signed)
 Called patient to check on him and he asked about his muscle relaxer. That was sent back on 11/06/23 to his pharmacy, but he's changed pharmacies. I have updated in chart. Can you send in a new Rx for the muscle relaxer please to the correct pharmacy. Thank you.

## 2023-11-20 NOTE — Telephone Encounter (Signed)
 No problem.  Sent to pharmacy on file

## 2023-11-28 ENCOUNTER — Encounter: Payer: Self-pay | Admitting: Family Medicine

## 2023-11-28 ENCOUNTER — Ambulatory Visit (INDEPENDENT_AMBULATORY_CARE_PROVIDER_SITE_OTHER): Admitting: Family Medicine

## 2023-11-28 VITALS — BP 130/74 | HR 84 | Temp 97.0°F | Ht 67.0 in | Wt 172.4 lb

## 2023-11-28 DIAGNOSIS — I739 Peripheral vascular disease, unspecified: Secondary | ICD-10-CM | POA: Diagnosis not present

## 2023-11-28 DIAGNOSIS — N1831 Chronic kidney disease, stage 3a: Secondary | ICD-10-CM

## 2023-11-28 DIAGNOSIS — Z96642 Presence of left artificial hip joint: Secondary | ICD-10-CM | POA: Diagnosis not present

## 2023-11-28 DIAGNOSIS — F17200 Nicotine dependence, unspecified, uncomplicated: Secondary | ICD-10-CM | POA: Diagnosis not present

## 2023-11-28 DIAGNOSIS — R399 Unspecified symptoms and signs involving the genitourinary system: Secondary | ICD-10-CM | POA: Diagnosis not present

## 2023-11-28 DIAGNOSIS — F17210 Nicotine dependence, cigarettes, uncomplicated: Secondary | ICD-10-CM | POA: Diagnosis not present

## 2023-11-28 MED ORDER — TAMSULOSIN HCL 0.4 MG PO CAPS
0.4000 mg | ORAL_CAPSULE | Freq: Every day | ORAL | 3 refills | Status: AC
Start: 2023-11-28 — End: ?

## 2023-11-28 NOTE — Assessment & Plan Note (Signed)
 Stable. No current claudication. Strongly recommend he continue to work at smoking cessation. Continue to focus on lipid lowering.

## 2023-11-28 NOTE — Progress Notes (Signed)
 [facility]IMARY [facility]ARE [facility] [address] [place] [address] Dept: [phone] Dept Fax: [phone]  Chronic Care Office Visit  Subjective:    Patient ID: Gerald Carpenter, male    DOB: 1950/03/25, 73 y.o..   MRN: [id number]  Chief Complaint  Patient presents with   Follow-up    3 month f/u.  No concerns.     History of Present Illness:  Patient is in today for reassessment of chronic medical issues.  Gerald Carpenter has a long-standing history of smoking. He continues smoking about 10 cigarettes a day. 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 He is not having any symptoms currently. He is managed on atorvastatin  40 mg daily.   Gerald Carpenter had a left total hip joint replacement 10 days ago. He notes the hip joint pain is resolved, though he continues to have some pain associated with the surgery. He feels this is improving. He will follow up with orthopedics next week. He is ambulating at home with a cane and has completed his home PT.  Gerald Carpenter has a history fo LUTS. He notes he continues to get up some at night to urinate. He is managed on tamsulosin  0.4 mg daily.  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/04/2021   Leukocytosis 10/11/2020   PAD (peripheral artery disease) 06/29/2020   Prediabetes 04/22/2020   Chronic kidney disease, stage 3a (HCC) 04/15/2020   Severe tobacco dependence 04/12/2020   Lower urinary tract symptoms (LUTS) 04/12/2020   History of colon polyps 04/12/2020   Atherosclerosis of native arteries of extremity with intermittent claudication 03/01/2020   Pain in both feet 07/31/2019   Status post total replacement of right hip 05/25/2019   Lumbar radiculopathy 02/05/2019   Past Surgical History:   Procedure Laterality Date   ABDOMINAL AORTOGRAM W/LOWER EXTREMITY N/A 06/23/2020   Procedure: ABDOMINAL AORTOGRAM W/LOWER EXTREMITY;  Surgeon: Gretta Lonni PARAS, MD;  [facility];  Service: Cardiovascular;  Laterality: N/A;   ANTERIOR LAT LUMBAR FUSION Right 02/05/2019   Procedure: ATTEMPTED ANTERIOR LATERAL INTERBODY FUSION;  Surgeon: Lanis Pupa, MD;  [facility];  Service: Neurosurgery;  Laterality: Right;  RIGHT LATERAL TRANSPSOAS DISCECTOMY AND FUSION WITH ROBOTIC ASSISTED PEDICLE SCREW STABILIZATION, LUMBAR 4- LUMBAR 5   CATARACT EXTRACTION W/ INTRAOCULAR LENS  IMPLANT, BILATERAL Bilateral 2013   lens implants for cataracts after Lasik   COLONOSCOPY     ENDOVEIN HARVEST OF GREATER SAPHENOUS VEIN Left 06/29/2020   Procedure: HARVEST OF LEFT GREATER SAPHENOUS VEIN;  Surgeon: Gretta Lonni PARAS, MD;  [facility];  Service: Vascular;  Laterality: Left;   EYE SURGERY  2013   FEMORAL-POPLITEAL BYPASS GRAFT Left 06/29/2020   Procedure: BYPASS GRAFT LEFT COMMON FEMORAL TO ABOVE KNEE POPLITEAL ARTERY;  Surgeon: Gretta Lonni PARAS, MD;  [facility];  Service: Vascular;  Laterality: Left;   HEMORRHOID SURGERY N/A 04/06/2021   Procedure: HEMORRHOIDECTOMY 2 COLUMN;  Surgeon: Teresa Lonni HERO, MD;  Location: [facility];  Service: General;  Laterality: N/A;   KNEE ARTHROSCOPY Right 1980   RECTAL EXAM UNDER ANESTHESIA N/A 04/06/2021   Procedure: ANORECTAL EXAM UNDER ANESTHESIA/ excision of anal polyp;  Surgeon: Teresa Lonni HERO, MD;  Location[facility];  Service: General;  Laterality: N/A;   TONSILLECTOMY  at a very young age, but grew back   TOTAL HIP ARTHROPLASTY Right 05/25/2019   Procedure: RIGHT TOTAL HIP ARTHROPLASTY ANTERIOR APPROACH;  Surgeon: Jerri Kay HERO, MD;  [facility];  Service: Orthopedics;  Laterality: Right;   TOTAL HIP ARTHROPLASTY Left 11/18/2023   Procedure: ARTHROPLASTY, HIP, TOTAL,  ANTERIOR APPROACH;  Surgeon: Jerri Kay HERO, MD;  [facility];  Service: Orthopedics;  Laterality: Left;   TOTAL KNEE ARTHROPLASTY Right 09/04/2021   Procedure: RIGHT TOTAL KNEE ARTHROPLASTY;  Surgeon: Jerri Kay HERO, MD;  [facility];  Service: Orthopedics;  Laterality: Right;   Family History  Problem Relation Age of Onset   Pulmonary fibrosis Mother    Breast cancer Mother    Hyperlipidemia Mother    Hypertension Mother    Other Mother        Scarlet Fever   Heart disease Father    Hyperlipidemia Father    Diabetes Brother    Hypertension Brother    Hypertension Brother    Breast cancer Maternal Aunt    Prostate cancer Paternal Grandfather    Hypothyroidism Daughter    Alcohol abuse Son    Colon cancer Neg Hx    Esophageal cancer Neg Hx    Ulcerative colitis Neg Hx    Uterine cancer Neg Hx    Outpatient Medications Prior to Visit  Medication Sig Dispense Refill   aspirin  (ASPIRIN  81) 81 MG chewable tablet Chew 1 tablet (81 mg total) by mouth in the morning and at bedtime. To be taken after surgery to prevent blood clots 84 tablet 0   atorvastatin  (LIPITOR) 40 MG tablet TAKE 1 TABLET(40 MG) BY MOUTH DAILY 90 tablet 3   docusate sodium  (COLACE) 100 MG capsule Take 1 capsule (100 mg total) by mouth daily as needed. 30 capsule 2   ibuprofen (ADVIL) 800 MG tablet Take 1 tablet (800 mg total) by mouth every 8 (eight) hours as needed. Please take with food and please also take something like omeprazole or zantac to coat your stomach 60 tablet 1   methocarbamol  (ROBAXIN ) 750 MG tablet Take 1 tablet (750 mg total) by mouth 3 (three) times daily as needed. 30 tablet 2   ondansetron  (ZOFRAN ) 4 MG tablet Take 1 tablet (4 mg total) by mouth every 8 (eight) hours as needed for nausea or vomiting. 40 tablet 0   Wheat Dextrin-Vit B6-B12-FA (BENEFIBER PLUS HEART HEALTH PO) Take 2 Scoops by mouth daily.     tamsulosin  (FLOMAX ) 0.4 MG CAPS capsule TAKE 1 CAPSULE(0.4 MG) BY MOUTH IN THE  MORNING 90 capsule 3   No facility-administered medications prior to visit.   Allergies  Allergen Reactions   Anesthetics, Amide Nausea And Vomiting   Percocet [Oxycodone -Acetaminophen ] Nausea And Vomiting   Objective:   Today's Vitals   11/28/23 0801  BP: 130/74  Pulse: 84  Temp: (!) 97 F (36.1 C)  TempSrc: Temporal  SpO2: 94%  Weight: 172 lb 6.4 oz (78.2 kg)  Height: 5' 7 (1.702 m)   Body mass index is 27 kg/m.   General: Well developed, well nourished. No acute distress. Extremities: Dressings are intake over the anterior left hip joint/upper thigh. No sign of redness or swelling beyond the borders   of the dressings. Psych: Alert and oriented. Normal mood and affect.  Health Maintenance Due  Topic Date Due   Zoster Vaccines- Shingrix (1 of 2) 01/07/1972     Assessment & Plan:   Problem List Items Addressed This Visit  Cardiovascular and Mediastinum   PAD (peripheral artery disease)   Stable. No current claudication. Strongly recommend he continue to work at smoking cessation. Continue to focus on lipid lowering.        Genitourinary   Chronic kidney disease, stage 3a (HCC)   Stable. Continue focus on blood pressure control, adequate hydration, and avoidance of nephrotoxic medications. Will plan repeat labs at his next appointment.         Other   Lower urinary tract symptoms (LUTS)   Stable. Continue tamsulosin  0.4 mg daily. If increased nocturia persists beyond tis post-operative period, would consider urology assessment.      Relevant Medications   tamsulosin  (FLOMAX ) 0.4 MG CAPS capsule   Severe tobacco dependence   Continue to advocate for abstinence of tobacco use. Gerald Carpenter is not ready at this point, though contemplates quitting once he is out of the post-operative period.  I spent 3 minutes counseling the patient about tobacco cessation.        Status post total replacement of left hip - Primary   Healing appears to be  progressing well. he will follow up with Dr. Jerri next week.       Return in about 3 months (around 02/28/2024) for Reassessment.   Garnette CHRISTELLA Simpler, MD

## 2023-11-28 NOTE — Assessment & Plan Note (Signed)
 Healing appears to be progressing well. he will follow up with Dr. Jerri next week.

## 2023-11-28 NOTE — Assessment & Plan Note (Signed)
 Continue to advocate for abstinence of tobacco use. Gerald Carpenter is not ready at this point, though contemplates quitting once he is out of the post-operative period.  I spent 3 minutes counseling the patient about tobacco cessation.

## 2023-11-28 NOTE — Assessment & Plan Note (Signed)
 Stable. Continue focus on blood pressure control, adequate hydration, and avoidance of nephrotoxic medications. Will plan repeat labs at his next appointment.

## 2023-11-28 NOTE — Assessment & Plan Note (Signed)
 Stable. Continue tamsulosin  0.4 mg daily. If increased nocturia persists beyond tis post-operative period, would consider urology assessment.

## 2023-12-03 ENCOUNTER — Ambulatory Visit (INDEPENDENT_AMBULATORY_CARE_PROVIDER_SITE_OTHER): Admitting: Physician Assistant

## 2023-12-03 DIAGNOSIS — Z96642 Presence of left artificial hip joint: Secondary | ICD-10-CM

## 2023-12-03 NOTE — Progress Notes (Signed)
 Post-Op Visit Note   Patient: Gerald Carpenter           Date of Birth: 22-Aug-1950           MRN: [id number] Visit Date: 12/03/2023 PCP: Thedora Garnette HERO, MD   Assessment & Plan:  Chief Complaint:  Chief Complaint  Patient presents with   Left Hip - Follow-up    Left THA 11/18/2023   Visit Diagnoses:  1. Status post total replacement of left hip     Plan: Patient is a pleasant 73 year old gentleman who comes in today 2 weeks status post left total hip replacement.  He has been doing well.  He has been taking ibuprofen for pain.  He has been taking aspirin  81 mg only once daily.  He had 2 sessions of home health PT and is ambulating with a cane.  Examination of his left hip reveals a well-healed surgical incision without complication.  Calves are soft nontender.  He is neurovascular intact distally.  Today, Steri-Strips were applied.  I have recommended that he take a baby aspirin  twice daily for DVT prophylaxis.  I have also encouraged him to take the least amount of ibuprofen as possible and to make sure he is taking a PPI or H2 blocker for which she tells me he is.  He will follow-up with us  in 4 weeks for repeat evaluation and AP pelvis x-rays.  Call with concerns or questions.  Follow-Up Instructions: Return in about 4 weeks (around 12/31/2023).   Orders:  No orders of the defined types were placed in this encounter.  No orders of the defined types were placed in this encounter.   Imaging: No new imaging  PMFS History: Patient Active Problem List   Diagnosis Date Noted   Status post total replacement of left hip 11/18/2023   Status post total right knee replacement 09/04/2021   Leukocytosis 10/11/2020   PAD (peripheral artery disease) 06/29/2020   Prediabetes 04/22/2020   Chronic kidney disease, stage 3a (HCC) 04/15/2020   Severe tobacco dependence 04/12/2020   Lower urinary tract symptoms (LUTS) 04/12/2020   History of colon polyps 04/12/2020   Atherosclerosis  of native arteries of extremity with intermittent claudication 03/01/2020   Pain in both feet 07/31/2019   Status post total replacement of right hip 05/25/2019   Lumbar radiculopathy 02/05/2019   Past Medical History:  Diagnosis Date   Alcoholism (HCC)    Arthritis    Cataract    Colon polyps    Complication of anesthesia    HLD (hyperlipidemia)    Nephritis    when pt. was 7 yrs. ols   Peripheral vascular disease    PONV (postoperative nausea and vomiting)    Only when I have aortagram on 06/23/20.   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   Substance abuse (HCC)     Family History  Problem Relation Age of Onset   Pulmonary fibrosis Mother    Breast cancer Mother    Hyperlipidemia Mother    Hypertension Mother    Other Mother        Scarlet Fever   Heart disease Father    Hyperlipidemia Father    Diabetes Brother    Hypertension Brother    Hypertension Brother    Breast cancer Maternal Aunt    Prostate cancer Paternal Grandfather    Hypothyroidism Daughter    Alcohol abuse Son    Colon cancer Neg Hx    Esophageal cancer Neg Hx  Ulcerative colitis Neg Hx    Uterine cancer Neg Hx     Past Surgical History:  Procedure Laterality Date   ABDOMINAL AORTOGRAM W/LOWER EXTREMITY N/A 06/23/2020   Procedure: ABDOMINAL AORTOGRAM W/LOWER EXTREMITY;  Surgeon: Gretta Lonni PARAS, MD;  [facility];  Service: Cardiovascular;  Laterality: N/A;   ANTERIOR LAT LUMBAR FUSION Right 02/05/2019   Procedure: ATTEMPTED ANTERIOR LATERAL INTERBODY FUSION;  Surgeon: Lanis Pupa, MD;  [facility];  Service: Neurosurgery;  Laterality: Right;  RIGHT LATERAL TRANSPSOAS DISCECTOMY AND FUSION WITH ROBOTIC ASSISTED PEDICLE SCREW STABILIZATION, LUMBAR 4- LUMBAR 5   CATARACT EXTRACTION W/ INTRAOCULAR LENS  IMPLANT, BILATERAL Bilateral 2013   lens implants for cataracts after Lasik   COLONOSCOPY     ENDOVEIN HARVEST OF GREATER SAPHENOUS VEIN Left 06/29/2020    Procedure: HARVEST OF LEFT GREATER SAPHENOUS VEIN;  Surgeon: Gretta Lonni PARAS, MD;  [facility];  Service: Vascular;  Laterality: Left;   EYE SURGERY  2013   FEMORAL-POPLITEAL BYPASS GRAFT Left 06/29/2020   Procedure: BYPASS GRAFT LEFT COMMON FEMORAL TO ABOVE KNEE POPLITEAL ARTERY;  Surgeon: Gretta Lonni PARAS, MD;  [facility];  Service: Vascular;  Laterality: Left;   HEMORRHOID SURGERY N/A 04/06/2021   Procedure: HEMORRHOIDECTOMY 2 COLUMN;  Surgeon: Teresa Lonni HERO, MD;  Location: [facility];  Service: General;  Laterality: N/A;   KNEE ARTHROSCOPY Right 1980   RECTAL EXAM UNDER ANESTHESIA N/A 04/06/2021   Procedure: ANORECTAL EXAM UNDER ANESTHESIA/ excision of anal polyp;  Surgeon: Teresa Lonni HERO, MD;  Location[facility];  Service: General;  Laterality: N/A;   TONSILLECTOMY     at a very young age, but grew back   TOTAL HIP ARTHROPLASTY Right 05/25/2019   Procedure: RIGHT TOTAL HIP ARTHROPLASTY ANTERIOR APPROACH;  Surgeon: Jerri Kay HERO, MD;  [facility];  Service: Orthopedics;  Laterality: Right;   TOTAL HIP ARTHROPLASTY Left 11/18/2023   Procedure: ARTHROPLASTY, HIP, TOTAL, ANTERIOR APPROACH;  Surgeon: Jerri Kay HERO, MD;  [facility];  Service: Orthopedics;  Laterality: Left;   TOTAL KNEE ARTHROPLASTY Right 09/04/2021   Procedure: RIGHT TOTAL KNEE ARTHROPLASTY;  Surgeon: Jerri Kay HERO, MD;  [facility];  Service: Orthopedics;  Laterality: Right;   Social History   Occupational History   Occupation: retired  Tobacco Use   Smoking status: Some Days    Current packs/day: 0.25    Average packs/day: 0.3 packs/day for 53.2 years (13.3 ttl pk-yrs)    Types: Cigarettes   Smokeless tobacco: Never   Tobacco comments:    had first cigarettes age  19, habit started at age 4/today he 12 cig a day  Vaping Use   Vaping status: Never Used  Substance and Sexual Activity   Alcohol use: Not Currently    Comment: 05/10/1991    Drug use: Not Currently    Comment: prior usage of speed  when drinking   Sexual activity: Yes

## 2023-12-23 ENCOUNTER — Encounter: Payer: Self-pay | Admitting: Radiology

## 2023-12-31 ENCOUNTER — Other Ambulatory Visit (INDEPENDENT_AMBULATORY_CARE_PROVIDER_SITE_OTHER): Payer: Self-pay

## 2023-12-31 ENCOUNTER — Ambulatory Visit: Admitting: Physician Assistant

## 2023-12-31 DIAGNOSIS — Z96642 Presence of left artificial hip joint: Secondary | ICD-10-CM

## 2023-12-31 MED ORDER — METHOCARBAMOL 500 MG PO TABS: 500.0000 mg | ORAL_TABLET | Freq: Two times a day (BID) | ORAL | 0 refills | Status: AC | PRN

## 2023-12-31 NOTE — Progress Notes (Signed)
 Post-Op Visit Note   Patient: Gerald Carpenter           Date of Birth: [birth year]           MRN: [id number] Visit Date: 12/31/2023 PCP: Thedora Garnette HERO, MD   Assessment & Plan:  Chief Complaint:  Chief Complaint  Patient presents with   Left Hip - Follow-up    Left THA 11/18/2023   Visit Diagnoses:  1. Status post total replacement of left hip     Plan: Patient is a pleasant 73 year old gentleman who comes in today 6 weeks status post left total hip replacement.  He has been doing much better, although notes increased pain during this cold snap.  He takes occasional ibuprofen and muscle relaxers.  He continues to work on a home exercise program.  He has been taking his regular dose of aspirin .  Examination of the left hip reveals mild discomfort with hip flexion and logroll.  He is neurovascularly intact distally.  At this point, he will continue with his home exercise program.  He will follow-up with us  in 6 weeks for recheck.  Call with concerns or questions.  Follow-Up Instructions: No follow-ups on file.   Orders:  Orders Placed This Encounter  Procedures   XR Pelvis 1-2 Views   Meds ordered this encounter  Medications   methocarbamol  (ROBAXIN ) 500 MG tablet    Sig: Take 1 tablet (500 mg total) by mouth 2 (two) times daily as needed.    Dispense:  20 tablet    Refill:  0    Imaging: XR Pelvis 1-2 Views Result Date: 12/31/2023 Well-seated prosthesis without complication   PMFS History: Patient Active Problem List   Diagnosis Date Noted   Status post total replacement of left hip 11/18/2023   Status post total right knee replacement 09/04/2021   Leukocytosis 10/11/2020   PAD (peripheral artery disease) 06/29/2020   Prediabetes 04/22/2020   Chronic kidney disease, stage 3a (HCC) 04/15/2020   Severe tobacco dependence 04/12/2020   Lower urinary tract symptoms (LUTS) 04/12/2020   History of colon polyps 04/12/2020   Atherosclerosis of native arteries of  extremity with intermittent claudication 03/01/2020   Pain in both feet 07/31/2019   Status post total replacement of right hip 05/25/2019   Lumbar radiculopathy 02/05/2019   Past Medical History:  Diagnosis Date   Alcoholism (HCC)    Arthritis    Cataract    Colon polyps    Complication of anesthesia    HLD (hyperlipidemia)    Nephritis    when pt. was 7 yrs. ols   Peripheral vascular disease    PONV (postoperative nausea and vomiting)    Only when I have aortagram on 06/23/20.   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   Substance abuse (HCC)     Family History  Problem Relation Age of Onset   Pulmonary fibrosis Mother    Breast cancer Mother    Hyperlipidemia Mother    Hypertension Mother    Other Mother        Scarlet Fever   Heart disease Father    Hyperlipidemia Father    Diabetes Brother    Hypertension Brother    Hypertension Brother    Breast cancer Maternal Aunt    Prostate cancer Paternal Grandfather    Hypothyroidism Daughter    Alcohol abuse Son    Colon cancer Neg Hx    Esophageal cancer Neg Hx    Ulcerative colitis Neg Hx  Uterine cancer Neg Hx     Past Surgical History:  Procedure Laterality Date   ABDOMINAL AORTOGRAM W/LOWER EXTREMITY N/A 06/23/2020   Procedure: ABDOMINAL AORTOGRAM W/LOWER EXTREMITY;  Surgeon: Gretta Lonni PARAS, MD;  [facility];  Service: Cardiovascular;  Laterality: N/A;   ANTERIOR LAT LUMBAR FUSION Right 02/05/2019   Procedure: ATTEMPTED ANTERIOR LATERAL INTERBODY FUSION;  Surgeon: Lanis Pupa, MD;  [facility];  Service: Neurosurgery;  Laterality: Right;  RIGHT LATERAL TRANSPSOAS DISCECTOMY AND FUSION WITH ROBOTIC ASSISTED PEDICLE SCREW STABILIZATION, LUMBAR 4- LUMBAR 5   CATARACT EXTRACTION W/ INTRAOCULAR LENS  IMPLANT, BILATERAL Bilateral 2013   lens implants for cataracts after Lasik   COLONOSCOPY     ENDOVEIN HARVEST OF GREATER SAPHENOUS VEIN Left 06/29/2020   Procedure: HARVEST OF  LEFT GREATER SAPHENOUS VEIN;  Surgeon: Gretta Lonni PARAS, MD;  [facility];  Service: Vascular;  Laterality: Left;   EYE SURGERY  2013   FEMORAL-POPLITEAL BYPASS GRAFT Left 06/29/2020   Procedure: BYPASS GRAFT LEFT COMMON FEMORAL TO ABOVE KNEE POPLITEAL ARTERY;  Surgeon: Gretta Lonni PARAS, MD;  [facility];  Service: Vascular;  Laterality: Left;   HEMORRHOID SURGERY N/A 04/06/2021   Procedure: HEMORRHOIDECTOMY 2 COLUMN;  Surgeon: Teresa Lonni HERO, MD;  Location: [facility];  Service: General;  Laterality: N/A;   KNEE ARTHROSCOPY Right 1980   RECTAL EXAM UNDER ANESTHESIA N/A 04/06/2021   Procedure: ANORECTAL EXAM UNDER ANESTHESIA/ excision of anal polyp;  Surgeon: Teresa Lonni HERO, MD;  Location: [facility];  Service: General;  Laterality: N/A;   TONSILLECTOMY     at a very young age, but grew back   TOTAL HIP ARTHROPLASTY Right 05/25/2019   Procedure: RIGHT TOTAL HIP ARTHROPLASTY ANTERIOR APPROACH;  Surgeon: Jerri Kay HERO, MD;  [facility];  Service: Orthopedics;  Laterality: Right;   TOTAL HIP ARTHROPLASTY Left 11/18/2023   Procedure: ARTHROPLASTY, HIP, TOTAL, ANTERIOR APPROACH;  Surgeon: Jerri Kay HERO, MD;  [facility];  Service: Orthopedics;  Laterality: Left;   TOTAL KNEE ARTHROPLASTY Right 09/04/2021   Procedure: RIGHT TOTAL KNEE ARTHROPLASTY;  Surgeon: Jerri Kay HERO, MD;  [facility];  Service: Orthopedics;  Laterality: Right;   Social History   Occupational History   Occupation: retired  Tobacco Use   Smoking status: Some Days    Current packs/day: 0.25    Average packs/day: 0.3 packs/day for 53.2 years (13.3 ttl pk-yrs)    Types: Cigarettes   Smokeless tobacco: Never   Tobacco comments:    had first cigarettes age  77, habit started at age 64/today he 65 cig a day  Vaping Use   Vaping status: Never Used  Substance and Sexual Activity   Alcohol use: Not Currently    Comment: 05/10/1991   Drug use: Not Currently     Comment: prior usage of speed  when drinking   Sexual activity: Yes

## 2024-02-02 ENCOUNTER — Other Ambulatory Visit: Payer: Self-pay | Admitting: Family Medicine

## 2024-02-02 DIAGNOSIS — R399 Unspecified symptoms and signs involving the genitourinary system: Secondary | ICD-10-CM

## 2024-02-11 ENCOUNTER — Ambulatory Visit (INDEPENDENT_AMBULATORY_CARE_PROVIDER_SITE_OTHER): Admitting: Physician Assistant

## 2024-02-11 DIAGNOSIS — Z96642 Presence of left artificial hip joint: Secondary | ICD-10-CM

## 2024-02-11 NOTE — Progress Notes (Signed)
 "  Post-Op Visit Note   Patient: Gerald Carpenter           Date of Birth: [birth year]           MRN: [id number] Visit Date: 02/11/2024 PCP: Thedora Garnette HERO, MD   Assessment & Plan:  Chief Complaint:  Chief Complaint  Patient presents with   Left Hip - Follow-up    Left THA 11/18/2023   Visit Diagnoses:  1. Status post total replacement of left hip     Plan: Patient is a pleasant 73 year old gentleman who comes in today 3 months status post left total hip replacement.  He is doing great.  He does note some occasional stiffness going from seated to standing position as well as some left buttock pain when he is sleeping at night.  Overall, very pleased with his outcome.  Examination of the left hip reveals painless hip flexion and logroll.  He is neurovascular intact distally.  At this point, he will continue to advance with activity as tolerated.  Follow-up in 9 months for repeat evaluation and AP pelvis x-rays.  Call with concerns or questions.  Follow-Up Instructions: Return in about 9 months (around 11/11/2024).   Orders:  No orders of the defined types were placed in this encounter.  No orders of the defined types were placed in this encounter.   Imaging: No new imaging  PMFS History: Patient Active Problem List   Diagnosis Date Noted   Status post total replacement of left hip 11/18/2023   Status post total right knee replacement 09/04/2021   Leukocytosis 10/11/2020   PAD (peripheral artery disease) 06/29/2020   Prediabetes 04/22/2020   Chronic kidney disease, stage 3a (HCC) 04/15/2020   Severe tobacco dependence 04/12/2020   Lower urinary tract symptoms (LUTS) 04/12/2020   History of colon polyps 04/12/2020   Atherosclerosis of native arteries of extremity with intermittent claudication 03/01/2020   Pain in both feet 07/31/2019   Status post total replacement of right hip 05/25/2019   Lumbar radiculopathy 02/05/2019   Past Medical History:  Diagnosis Date    Alcoholism (HCC)    Arthritis    Cataract    Colon polyps    Complication of anesthesia    HLD (hyperlipidemia)    Nephritis    when pt. was 7 yrs. ols   Peripheral vascular disease    PONV (postoperative nausea and vomiting)    Only when I have aortagram on 06/23/20.   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   Substance abuse (HCC)     Family History  Problem Relation Age of Onset   Pulmonary fibrosis Mother    Breast cancer Mother    Hyperlipidemia Mother    Hypertension Mother    Other Mother        Scarlet Fever   Heart disease Father    Hyperlipidemia Father    Diabetes Brother    Hypertension Brother    Hypertension Brother    Breast cancer Maternal Aunt    Prostate cancer Paternal Grandfather    Hypothyroidism Daughter    Alcohol abuse Son    Colon cancer Neg Hx    Esophageal cancer Neg Hx    Ulcerative colitis Neg Hx    Uterine cancer Neg Hx     Past Surgical History:  Procedure Laterality Date   ABDOMINAL AORTOGRAM W/LOWER EXTREMITY N/A 06/23/2020   Procedure: ABDOMINAL AORTOGRAM W/LOWER EXTREMITY;  Surgeon: Gretta Lonni PARAS, MD;  [facility]: [facility];  Service: Cardiovascular;  Laterality: N/A;   ANTERIOR LAT LUMBAR FUSION Right 02/05/2019   Procedure: ATTEMPTED ANTERIOR LATERAL INTERBODY FUSION;  Surgeon: Lanis Pupa, MD;  Location: [facility];  Service: Neurosurgery;  Laterality: Right;  RIGHT LATERAL TRANSPSOAS DISCECTOMY AND FUSION WITH ROBOTIC ASSISTED PEDICLE SCREW STABILIZATION, LUMBAR 4- LUMBAR 5   CATARACT EXTRACTION W/ INTRAOCULAR LENS  IMPLANT, BILATERAL Bilateral 2013   lens implants for cataracts after Lasik   COLONOSCOPY     ENDOVEIN HARVEST OF GREATER SAPHENOUS VEIN Left 06/29/2020   Procedure: HARVEST OF LEFT GREATER SAPHENOUS VEIN;  Surgeon: Gretta Lonni PARAS, MD;  Location: [facility];  Service: Vascular;  Laterality: Left;   EYE SURGERY  2013   FEMORAL-POPLITEAL BYPASS GRAFT Left 06/29/2020   Procedure: BYPASS GRAFT LEFT  COMMON FEMORAL TO ABOVE KNEE POPLITEAL ARTERY;  Surgeon: Gretta Lonni PARAS, MD;  Location: [facility];  Service: Vascular;  Laterality: Left;   HEMORRHOID SURGERY N/A 04/06/2021   Procedure: HEMORRHOIDECTOMY 2 COLUMN;  Surgeon: Teresa Lonni HERO, MD;  Location: [facility];  Service: General;  Laterality: N/A;   KNEE ARTHROSCOPY Right 1980   RECTAL EXAM UNDER ANESTHESIA N/A 04/06/2021   Procedure: ANORECTAL EXAM UNDER ANESTHESIA/ excision of anal polyp;  Surgeon: Teresa Lonni HERO, MD;  Location: [facility];  Service: General;  Laterality: N/A;   TONSILLECTOMY     at a very young age, but grew back   TOTAL HIP ARTHROPLASTY Right 05/25/2019   Procedure: RIGHT TOTAL HIP ARTHROPLASTY ANTERIOR APPROACH;  Surgeon: Jerri Kay HERO, MD;  Location: [facility];  Service: Orthopedics;  Laterality: Right;   TOTAL HIP ARTHROPLASTY Left 11/18/2023   Procedure: ARTHROPLASTY, HIP, TOTAL, ANTERIOR APPROACH;  Surgeon: Jerri Kay HERO, MD;  [facility];  Service: Orthopedics;  Laterality: Left;   TOTAL KNEE ARTHROPLASTY Right 09/04/2021   Procedure: RIGHT TOTAL KNEE ARTHROPLASTY;  Surgeon: Jerri Kay HERO, MD;  [facility];  Service: Orthopedics;  Laterality: Right;   Social History   Occupational History   Occupation: retired  Tobacco Use   Smoking status: Some Days    Current packs/day: 0.25    Average packs/day: 0.3 packs/day for 53.2 years (13.3 ttl pk-yrs)    Types: Cigarettes   Smokeless tobacco: Never   Tobacco comments:    had first cigarettes age  49, habit started at age 28/today he 30 cig a day  Vaping Use   Vaping status: Never Used  Substance and Sexual Activity   Alcohol use: Not Currently    Comment: 05/10/1991   Drug use: Not Currently    Comment: prior usage of speed  when drinking   Sexual activity: Yes     "

## 2024-02-28 ENCOUNTER — Ambulatory Visit (INDEPENDENT_AMBULATORY_CARE_PROVIDER_SITE_OTHER): Admitting: Family Medicine

## 2024-02-28 ENCOUNTER — Encounter: Payer: Self-pay | Admitting: Family Medicine

## 2024-02-28 ENCOUNTER — Telehealth: Payer: Self-pay | Admitting: *Deleted

## 2024-02-28 VITALS — BP 126/70 | HR 87 | Temp 98.1°F | Ht 67.0 in | Wt 166.4 lb

## 2024-02-28 DIAGNOSIS — F17210 Nicotine dependence, cigarettes, uncomplicated: Secondary | ICD-10-CM

## 2024-02-28 DIAGNOSIS — N1831 Chronic kidney disease, stage 3a: Secondary | ICD-10-CM | POA: Diagnosis not present

## 2024-02-28 DIAGNOSIS — F17200 Nicotine dependence, unspecified, uncomplicated: Secondary | ICD-10-CM

## 2024-02-28 DIAGNOSIS — I739 Peripheral vascular disease, unspecified: Secondary | ICD-10-CM

## 2024-02-28 NOTE — Assessment & Plan Note (Signed)
 Stable. Continue tamsulosin  0.4 mg daily. If increased nocturia persists beyond tis post-operative period, would consider urology assessment.

## 2024-02-28 NOTE — Progress Notes (Signed)
 " [facility] [address] [place] [address] Dept: [phone] Dept Fax: [phone]  Chronic Care Office Visit  Subjective:    Patient ID: Gerald Carpenter, male    DOB: 23-Nov-1950, 74 y.o..   MRN: [id number]  Chief Complaint  Patient presents with   Follow-up    3 month f/u.  Fasting today, no concerns    History of Present Illness:  Patient is in today for reassessment of chronic medical conditions.   Mr. Gerald Carpenter has a long-standing history of smoking. He notes he has tapered down to 3-4 cigarettes a day. He does plan to quit and has obtained both nicotine  patches (21 mg/d) and lozenges.  He finds the first cigarette fo the day, which he has with his coffee, to be the hardest to give up. 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 He is not having any symptoms of claudication currently. He is managed on atorvastatin  40 mg daily.    Mr. Gerald Carpenter had a left total hip joint replacement in September 2025. He notes his strength is improving and he is gettign around better, though he feels he still has some work to do.   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/04/2021   Leukocytosis 10/11/2020   PAD (peripheral artery disease) 06/29/2020   Prediabetes 04/22/2020   Chronic kidney disease, stage 3a (HCC) 04/15/2020   Severe tobacco dependence 04/12/2020   Lower urinary tract symptoms (LUTS) 04/12/2020   History of colon polyps 04/12/2020   Atherosclerosis of native arteries of extremity with intermittent claudication 03/01/2020   Pain in both feet 07/31/2019   Status post total replacement of right hip 05/25/2019   Lumbar radiculopathy 02/05/2019   Past Surgical History:  Procedure Laterality Date   ABDOMINAL  AORTOGRAM W/LOWER EXTREMITY N/A 06/23/2020   Procedure: ABDOMINAL AORTOGRAM W/LOWER EXTREMITY;  Surgeon: Gretta Lonni PARAS, MD;  [facility];  Service: Cardiovascular;  Laterality: N/A;   ANTERIOR LAT LUMBAR FUSION Right 02/05/2019   Procedure: ATTEMPTED ANTERIOR LATERAL INTERBODY FUSION;  Surgeon: Lanis Pupa, MD;  Location: [facility];  Service: Neurosurgery;  Laterality: Right;  RIGHT LATERAL TRANSPSOAS DISCECTOMY AND FUSION WITH ROBOTIC ASSISTED PEDICLE SCREW STABILIZATION, LUMBAR 4- LUMBAR 5   CATARACT EXTRACTION W/ INTRAOCULAR LENS  IMPLANT, BILATERAL Bilateral 2013   lens implants for cataracts after Lasik   COLONOSCOPY     ENDOVEIN HARVEST OF GREATER SAPHENOUS VEIN Left 06/29/2020   Procedure: HARVEST OF LEFT GREATER SAPHENOUS VEIN;  Surgeon: Gretta Lonni PARAS, MD;  [facility]: [facility];  Service: Vascular;  Laterality: Left;   EYE SURGERY  2013   FEMORAL-POPLITEAL BYPASS GRAFT Left 06/29/2020   Procedure: BYPASS GRAFT LEFT COMMON FEMORAL TO ABOVE KNEE POPLITEAL ARTERY;  Surgeon: Gretta Lonni PARAS, MD;  Location: [facility];  Service: Vascular;  Laterality: Left;   HEMORRHOID SURGERY N/A 04/06/2021   Procedure: HEMORRHOIDECTOMY 2 COLUMN;  Surgeon: Teresa Lonni HERO, MD;  [facility];  Service: General;  Laterality: N/A;   KNEE ARTHROSCOPY Right 1980   RECTAL EXAM UNDER ANESTHESIA N/A 04/06/2021   Procedure: ANORECTAL EXAM UNDER ANESTHESIA/ excision of anal polyp;  Surgeon: Teresa Lonni HERO, MD;  Location: [facility];  Service: General;  Laterality: N/A;   TONSILLECTOMY     at  a very young age, but grew back   TOTAL HIP ARTHROPLASTY Right 05/25/2019   Procedure: RIGHT TOTAL HIP ARTHROPLASTY ANTERIOR APPROACH;  Surgeon: Jerri Kay HERO, MD;  [facility];  Service: Orthopedics;  Laterality: Right;   TOTAL HIP ARTHROPLASTY Left 11/18/2023   Procedure: ARTHROPLASTY, HIP, TOTAL, ANTERIOR APPROACH;  Surgeon: Jerri Kay HERO, MD;   [facility];  Service: Orthopedics;  Laterality: Left;   TOTAL KNEE ARTHROPLASTY Right 09/04/2021   Procedure: RIGHT TOTAL KNEE ARTHROPLASTY;  Surgeon: Jerri Kay HERO, MD;  [facility];  Service: Orthopedics;  Laterality: Right;   Family History  Problem Relation Age of Onset   Pulmonary fibrosis Mother    Breast cancer Mother    Hyperlipidemia Mother    Hypertension Mother    Other Mother        Scarlet Fever   Heart disease Father    Hyperlipidemia Father    Diabetes Brother    Hypertension Brother    Hypertension Brother    Breast cancer Maternal Aunt    Prostate cancer Paternal Grandfather    Hypothyroidism Daughter    Alcohol abuse Son    Colon cancer Neg Hx    Esophageal cancer Neg Hx    Ulcerative colitis Neg Hx    Uterine cancer Neg Hx    Outpatient Medications Prior to Visit  Medication Sig Dispense Refill   aspirin  (ASPIRIN  81) 81 MG chewable tablet Chew 1 tablet (81 mg total) by mouth in the morning and at bedtime. To be taken after surgery to prevent blood clots 84 tablet 0   atorvastatin  (LIPITOR) 40 MG tablet TAKE 1 TABLET(40 MG) BY MOUTH DAILY 90 tablet 3   docusate sodium  (COLACE) 100 MG capsule Take 1 capsule (100 mg total) by mouth daily as needed. 30 capsule 2   ibuprofen  (ADVIL ) 800 MG tablet Take 1 tablet (800 mg total) by mouth every 8 (eight) hours as needed. Please take with food and please also take something like omeprazole or zantac to coat your stomach 60 tablet 1   methocarbamol  (ROBAXIN ) 500 MG tablet Take 1 tablet (500 mg total) by mouth 2 (two) times daily as needed. 20 tablet 0   methocarbamol  (ROBAXIN ) 750 MG tablet Take 1 tablet (750 mg total) by mouth 3 (three) times daily as needed. 30 tablet 2   ondansetron  (ZOFRAN ) 4 MG tablet Take 1 tablet (4 mg total) by mouth every 8 (eight) hours as needed for nausea or vomiting. 40 tablet 0   tamsulosin  (FLOMAX ) 0.4 MG CAPS capsule TAKE 1 CAPSULE(0.4 MG) BY MOUTH IN THE MORNING 90 capsule 3    Wheat Dextrin-Vit B6-B12-FA (BENEFIBER PLUS HEART HEALTH PO) Take 2 Scoops by mouth daily.     No facility-administered medications prior to visit.   Allergies[1]   Objective:   Today's Vitals   02/28/24 0803  BP: 126/70  Pulse: 87  Temp: 98.1 F (36.7 C)  TempSrc: Temporal  SpO2: 93%  Weight: 166 lb 6.4 oz (75.5 kg)  Height: 5' 7 (1.702 m)   Body mass index is 26.06 kg/m.   General: Well developed, well nourished. No acute distress. Psych: Alert and oriented. Normal mood and affect.  Health Maintenance Due  Topic Date Due   Zoster Vaccines- Shingrix (1 of 2) 01/07/1972   COVID-19 Vaccine (3 - Pfizer risk series) 06/02/2019   Lab Results    Latest Ref Rng & Units 11/13/2023    9:48 AM 08/28/2023    8:56 AM 04/25/2022  8:38 AM  CMP  Glucose 70 - 99 mg/dL 888  867  95   BUN 8 - 23 mg/dL 11  19  19    Creatinine 0.61 - 1.24 mg/dL 8.69  8.58  8.68   Sodium 135 - 145 mmol/L 139  135  140   Potassium 3.5 - 5.1 mmol/L 4.1  3.9  4.3   Chloride 98 - 111 mmol/L 106  100  109   CO2 22 - 32 mmol/L 23  27  23    Calcium  8.9 - 10.3 mg/dL 8.8  9.3  8.8   Total Protein 6.0 - 8.3 g/dL  7.1    Total Bilirubin 0.2 - 1.2 mg/dL  0.6    Alkaline Phos 39 - 117 U/L  54    AST 0 - 37 U/L  20    ALT 0 - 53 U/L  30       Lab Results  Component Value Date   CHOL 96 08/28/2023   HDL 23.80 (L) 08/28/2023   LDLCALC 49 08/28/2023   TRIG 116.0 08/28/2023   CHOLHDL 4 08/28/2023    Assessment & Plan:   Problem List Items Addressed This Visit       Cardiovascular and Mediastinum   PAD (peripheral artery disease)   Stable. No current claudication. Strongly recommend he continue to work at smoking cessation. Continue to focus on lipid lowering with atorvastatin  40 mg daily.        Genitourinary   Chronic kidney disease, stage 3a (HCC)   Stable. Continue focus on blood pressure control, adequate hydration, and avoidance of nephrotoxic medications.        Other   Severe tobacco  dependence - Primary   Continue to advocate for abstinence of tobacco use. Mr. Litle is preparing to quit. I recommend he use a 7 mg/d patch rather than the 21 mg/d patch based on his current smoking level. I recommend he try using a lozenge first thin int he morning to help combat his nicotine  craving and that he try moving to a different location in the home to drink his coffe and substitute a pen in his hand for a cigarette.  I spent 5 minutes counseling the patient about tobacco cessation.        Return in about 6 months (around 08/27/2024) for Reassessment.   Garnette CHRISTELLA Simpler, MD  I,Emily Lagle,acting as a scribe for Garnette CHRISTELLA Simpler, MD.,have documented all relevant documentation on the behalf of Garnette CHRISTELLA Simpler, MD.  I, Garnette CHRISTELLA Simpler, MD, have reviewed all documentation for this visit. The documentation on 02/28/2024 for the exam, diagnosis, procedures, and orders are all accurate and complete.     [1]  Allergies Allergen Reactions   Anesthetics, Amide Nausea And Vomiting   Percocet [Oxycodone -Acetaminophen ] Nausea And Vomiting   "

## 2024-02-28 NOTE — Telephone Encounter (Signed)
 90 day post op call completed; See ortho bundle flo sheet for Gerald Carpenter. Completion.

## 2024-02-28 NOTE — Assessment & Plan Note (Addendum)
 Stable. No current claudication. Strongly recommend he continue to work at smoking cessation. Continue to focus on lipid lowering with atorvastatin  40 mg daily.

## 2024-02-28 NOTE — Assessment & Plan Note (Addendum)
 Stable. Continue focus on blood pressure control, adequate hydration, and avoidance of nephrotoxic medications.

## 2024-02-28 NOTE — Assessment & Plan Note (Addendum)
 Continue to advocate for abstinence of tobacco use. Gerald Carpenter is preparing to quit. I recommend he use a 7 mg/d patch rather than the 21 mg/d patch based on his current smoking level. I recommend he try using a lozenge first thin int he morning to help combat his nicotine  craving and that he try moving to a different location in the home to drink his coffe and substitute a pen in his hand for a cigarette.  I spent 5 minutes counseling the patient about tobacco cessation.

## 2024-07-13 ENCOUNTER — Ambulatory Visit

## 2024-08-27 ENCOUNTER — Ambulatory Visit: Admitting: Family Medicine

## 2024-11-11 ENCOUNTER — Ambulatory Visit: Admitting: Physician Assistant
